# Patient Record
Sex: Male | Born: 1960 | Race: White | Hispanic: No | Smoking: Never smoker
Health system: Southern US, Community
[De-identification: ages and names within clinical notes are randomized; demographics above are authoritative.]

## PROBLEM LIST (undated history)

## (undated) DIAGNOSIS — J80 Acute respiratory distress syndrome: Secondary | ICD-10-CM

## (undated) DIAGNOSIS — I4819 Other persistent atrial fibrillation: Secondary | ICD-10-CM

## (undated) DIAGNOSIS — Z87442 Personal history of urinary calculi: Secondary | ICD-10-CM

## (undated) DIAGNOSIS — Z8711 Personal history of peptic ulcer disease: Secondary | ICD-10-CM

## (undated) DIAGNOSIS — J189 Pneumonia, unspecified organism: Secondary | ICD-10-CM

## (undated) DIAGNOSIS — Z9289 Personal history of other medical treatment: Secondary | ICD-10-CM

## (undated) DIAGNOSIS — E669 Obesity, unspecified: Secondary | ICD-10-CM

## (undated) DIAGNOSIS — K219 Gastro-esophageal reflux disease without esophagitis: Secondary | ICD-10-CM

## (undated) DIAGNOSIS — Z9981 Dependence on supplemental oxygen: Secondary | ICD-10-CM

## (undated) DIAGNOSIS — G4733 Obstructive sleep apnea (adult) (pediatric): Secondary | ICD-10-CM

## (undated) DIAGNOSIS — G4700 Insomnia, unspecified: Secondary | ICD-10-CM

## (undated) DIAGNOSIS — Z9889 Other specified postprocedural states: Secondary | ICD-10-CM

## (undated) DIAGNOSIS — E785 Hyperlipidemia, unspecified: Secondary | ICD-10-CM

## (undated) DIAGNOSIS — T148XXA Other injury of unspecified body region, initial encounter: Secondary | ICD-10-CM

## (undated) DIAGNOSIS — M109 Gout, unspecified: Secondary | ICD-10-CM

## (undated) DIAGNOSIS — Z9989 Dependence on other enabling machines and devices: Secondary | ICD-10-CM

## (undated) DIAGNOSIS — I10 Essential (primary) hypertension: Secondary | ICD-10-CM

## (undated) HISTORY — PX: APPENDECTOMY: SHX54

## (undated) HISTORY — DX: Other specified postprocedural states: Z98.890

## (undated) HISTORY — DX: Other injury of unspecified body region, initial encounter: T14.8XXA

## (undated) HISTORY — DX: Obesity, unspecified: E66.9

## (undated) HISTORY — DX: Gastro-esophageal reflux disease without esophagitis: K21.9

## (undated) HISTORY — DX: Hyperlipidemia, unspecified: E78.5

## (undated) HISTORY — DX: Insomnia, unspecified: G47.00

## (undated) HISTORY — DX: Essential (primary) hypertension: I10

## (undated) HISTORY — DX: Gout, unspecified: M10.9

## (undated) HISTORY — PX: NERVE REPAIR: SHX2083

---
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
## 2013-05-30 DIAGNOSIS — Z9289 Personal history of other medical treatment: Secondary | ICD-10-CM

## 2013-05-30 HISTORY — DX: Personal history of other medical treatment: Z92.89

## 2014-04-29 DIAGNOSIS — Z8711 Personal history of peptic ulcer disease: Secondary | ICD-10-CM

## 2014-04-29 HISTORY — DX: Personal history of peptic ulcer disease: Z87.11

## 2014-05-30 DIAGNOSIS — Z8701 Personal history of pneumonia (recurrent): Secondary | ICD-10-CM | POA: Insufficient documentation

## 2014-07-29 HISTORY — PX: TRACHELECTOMY: SHX6586

## 2014-08-16 ENCOUNTER — Inpatient Hospital Stay
Admission: AD | Admit: 2014-08-16 | Payer: Self-pay | Source: Other Acute Inpatient Hospital | Admitting: Internal Medicine

## 2014-10-21 ENCOUNTER — Encounter: Payer: Self-pay | Admitting: Critical Care Medicine

## 2014-10-21 ENCOUNTER — Ambulatory Visit (INDEPENDENT_AMBULATORY_CARE_PROVIDER_SITE_OTHER): Payer: Self-pay | Admitting: Critical Care Medicine

## 2014-10-21 VITALS — BP 110/78 | HR 78 | Temp 98.0°F | Ht 72.0 in | Wt 302.2 lb

## 2014-10-21 DIAGNOSIS — Z8701 Personal history of pneumonia (recurrent): Secondary | ICD-10-CM

## 2014-10-21 DIAGNOSIS — E785 Hyperlipidemia, unspecified: Secondary | ICD-10-CM | POA: Insufficient documentation

## 2014-10-21 DIAGNOSIS — K219 Gastro-esophageal reflux disease without esophagitis: Secondary | ICD-10-CM

## 2014-10-21 DIAGNOSIS — J9611 Chronic respiratory failure with hypoxia: Secondary | ICD-10-CM

## 2014-10-21 DIAGNOSIS — Z9889 Other specified postprocedural states: Secondary | ICD-10-CM | POA: Insufficient documentation

## 2014-10-21 DIAGNOSIS — R0600 Dyspnea, unspecified: Secondary | ICD-10-CM

## 2014-10-21 DIAGNOSIS — I10 Essential (primary) hypertension: Secondary | ICD-10-CM | POA: Insufficient documentation

## 2014-10-21 DIAGNOSIS — J984 Other disorders of lung: Secondary | ICD-10-CM

## 2014-10-21 DIAGNOSIS — Z8709 Personal history of other diseases of the respiratory system: Secondary | ICD-10-CM

## 2014-10-21 DIAGNOSIS — I482 Chronic atrial fibrillation, unspecified: Secondary | ICD-10-CM | POA: Insufficient documentation

## 2014-10-21 DIAGNOSIS — J45909 Unspecified asthma, uncomplicated: Secondary | ICD-10-CM

## 2014-10-21 NOTE — Patient Instructions (Signed)
You must wear oxygen with exertion and at rest 2Liters A repeat Chest xray will be obtained A full lung function test will be obtained Return 1 month

## 2014-10-21 NOTE — Progress Notes (Signed)
Subjective:    Patient ID: Adam Benson, male    DOB: 1961/01/08, 54 y.o.   MRN: [id number]  HPI Comments: Referred for post hosp care:  In [place] hosp 05/2014>>06/2014 and then trf to [place] for further care for one month.   Pt was on vent with trach:  orig dx PNA.  Lifelong never smoker.  Out of hosp two months, trach taken out in [place] and d/c to home.    Pt had atrial fib and is on chronic anticoag therapy  Since home doing well, gradual return of smoke.  Electrician : nerve damage right arm, now on R arm f/t separate issue, now on disability  Has oxygen wears at home and prn out of house, on 2L qhs.  Never dx osa  Shortness of Breath This is a chronic problem. The problem occurs daily (exert self walking fast ). The problem has been rapidly improving. Pertinent negatives include no abdominal pain, chest pain, coryza, ear pain, headaches, leg pain, leg swelling, rash, rhinorrhea, sore throat, vomiting or wheezing. The symptoms are aggravated by exercise. He has tried nothing for the symptoms. His past medical history is significant for asthma and pneumonia. There is no history of allergies, aspirin allergies, bronchiolitis, CAD, DVT, a heart failure, PE or a recent surgery. (Asthma as a child )   Past Medical History  Diagnosis Date  . Gout   . HTN (hypertension), benign   . Chronic atrial fibrillation   . Hyperlipemia   . Nerve damage     right arm  . Obesity   . Insomnia   . GERD (gastroesophageal reflux disease)   . History of pneumonia 05/2014     two month hosp stay, vent, trach   . History of tracheostomy 06/2014>>out 07/2014    during 2016 hosp stay /vent     Family History  Problem Relation Age of Onset  . Heart disease Mother   . Diabetes Mother      History   Social History  . Marital Status: N/A    Spouse Name: N/A  . Number of Children: N/A  . Years of Education: N/A   Occupational History  . Not on file.   Social History Main Topics  . Smoking  status: Never Smoker   . Smokeless tobacco: Current User    Types: Chew  . Alcohol Use: No  . Drug Use: Not on file  . Sexual Activity: Not on file   Other Topics Concern  . Not on file   Social History Narrative   Lives alone. Divorced.        Allergies  Allergen Reactions  . Penicillins     hives     No outpatient prescriptions prior to visit.   No facility-administered medications prior to visit.       Review of Systems  Constitutional: Negative.   HENT: Negative.  Negative for ear pain, postnasal drip, rhinorrhea, sinus pressure, sore throat, trouble swallowing and voice change.   Eyes: Negative.   Respiratory: Positive for shortness of breath. Negative for apnea, cough, choking, chest tightness, wheezing and stridor.   Cardiovascular: Negative.  Negative for chest pain, palpitations and leg swelling.  Gastrointestinal: Negative.  Negative for nausea, vomiting, abdominal pain and abdominal distention.  Genitourinary: Negative.   Musculoskeletal: Negative.  Negative for myalgias and arthralgias.  Skin: Negative.  Negative for rash.  Allergic/Immunologic: Negative.  Negative for environmental allergies and food allergies.  Neurological: Negative.  Negative for dizziness, syncope, weakness and headaches.  Hematological: Negative.  Negative for adenopathy. Does not bruise/bleed easily.  Psychiatric/Behavioral: Negative.  Negative for sleep disturbance and agitation. The patient is not nervous/anxious.        Objective:   Physical Exam Filed Vitals:   10/21/14 1112  BP: 110/78  Pulse: 78  Temp: 98 F (36.7 C)  TempSrc: Oral  Height: 6' (1.829 m)  Weight: 302 lb 3.2 oz (137.077 kg)  SpO2: 94%    Gen: Pleasant, well-nourished, in no distress,  normal affect  ENT: No lesions,  mouth clear,  oropharynx clear, no postnasal drip  Neck: No JVD, no TMG, no carotid bruits  Lungs: No use of accessory muscles, no dullness to percussion,distant bs    Cardiovascular: RRR, heart sounds normal, no murmur or gallops, no peripheral edema  Abdomen: soft and NT, no HSM,  BS normal  Musculoskeletal: No deformities, no cyanosis or clubbing  Neuro: alert, non focal  Skin: Warm, no lesions or rashes  No results found.  Spirometry 5/24: restrictive defect   AMB sat:  83% after 3/4 lab on RA    Assessment & Plan:  I personally reviewed all images and lab data in the [facility] system as well as any outside material available during this office visit and agree with the  radiology impressions.   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  Diagnoses and all orders for this visit:  Chronic respiratory failure with hypoxia Orders: -     Pulmonary Function Test; Future -     DG Chest 2 View; Future -     DG Chest 2 View -     Pulmonary Function Test  HTN (hypertension), benign  Chronic atrial fibrillation  Hyperlipemia  Gastroesophageal reflux disease, esophagitis presence not specified  History of pneumonia Orders: -     DG Chest 2 View; Future -     DG Chest 2 View  History of tracheostomy  Dyspnea Orders: -     Spirometry with Graph  ARDS survivor Orders: -     Pulmonary Function Test; Future -     DG Chest 2 View; Future -     DG Chest 2 View -     Pulmonary Function Test  Restrictive airway disease Orders: -     Pulmonary Function Test; Future -     DG Chest 2 View; Future -     DG Chest 2 View -     Pulmonary Function Test    I had an extended discussion with the patient and or family lasting 15 minutes of a 25 minute visit including:  Disease state, treatement options.

## 2014-10-22 DIAGNOSIS — J984 Other disorders of lung: Secondary | ICD-10-CM | POA: Insufficient documentation

## 2014-10-22 DIAGNOSIS — Z8709 Personal history of other diseases of the respiratory system: Secondary | ICD-10-CM | POA: Insufficient documentation

## 2014-10-22 DIAGNOSIS — J9611 Chronic respiratory failure with hypoxia: Secondary | ICD-10-CM | POA: Insufficient documentation

## 2014-10-22 NOTE — Assessment & Plan Note (Signed)
Chronic resp failure with hypoxemia Cont oxygen therapy 24/7

## 2014-10-22 NOTE — Assessment & Plan Note (Signed)
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

## 2014-10-22 NOTE — Assessment & Plan Note (Signed)
Restrictive lung disease with interstitial process Plan  full pfts Repeat chest xray

## 2014-10-24 ENCOUNTER — Telehealth: Payer: Self-pay | Admitting: Critical Care Medicine

## 2014-10-24 DIAGNOSIS — J984 Other disorders of lung: Secondary | ICD-10-CM

## 2014-10-24 NOTE — Telephone Encounter (Signed)
Let pt know cxr shows residual scar from pneumonia This is why pt needs oxygen No other therapy

## 2014-10-24 NOTE — Telephone Encounter (Signed)
[phone], pt cb for crystal

## 2014-10-24 NOTE — Telephone Encounter (Signed)
[email or web address] for pt 

## 2014-10-24 NOTE — Telephone Encounter (Signed)
Called, spoke with pt.  Discussed cxr results and recs per Dr. Delford Field.  He verbalized understanding and voiced no further questions or concerns at this time.

## 2014-10-28 LAB — PULMONARY FUNCTION TEST

## 2014-11-04 ENCOUNTER — Telehealth: Payer: Self-pay | Admitting: Critical Care Medicine

## 2014-11-04 NOTE — Telephone Encounter (Signed)
-----   Message from Valentino Hue, RN sent at 11/04/2014  2:34 PM EDT ----- Regarding: PFT pt This is the PFT pt for [place] results

## 2014-11-04 NOTE — Telephone Encounter (Signed)
Let pt know pfts show severe restriction from scar tissue from pneumonia  no specific Rx for this

## 2014-11-04 NOTE — Telephone Encounter (Signed)
Spoke with pt.  Discussed PFT results and recs per Dr. Delford Field.  He verbalized understanding and voiced no further questions or concerns at this time.  Results placed in scan folder.

## 2014-11-18 ENCOUNTER — Ambulatory Visit (INDEPENDENT_AMBULATORY_CARE_PROVIDER_SITE_OTHER): Payer: Self-pay | Admitting: Critical Care Medicine

## 2014-11-18 ENCOUNTER — Encounter: Payer: Self-pay | Admitting: Critical Care Medicine

## 2014-11-18 VITALS — BP 122/80 | HR 86 | Temp 98.6°F | Ht 72.0 in | Wt 309.0 lb

## 2014-11-18 DIAGNOSIS — J45909 Unspecified asthma, uncomplicated: Secondary | ICD-10-CM

## 2014-11-18 DIAGNOSIS — J849 Interstitial pulmonary disease, unspecified: Secondary | ICD-10-CM

## 2014-11-18 DIAGNOSIS — J984 Other disorders of lung: Secondary | ICD-10-CM

## 2014-11-18 DIAGNOSIS — J9611 Chronic respiratory failure with hypoxia: Secondary | ICD-10-CM

## 2014-11-18 NOTE — Patient Instructions (Signed)
No change in medications Stay on oxygen Return as needed

## 2014-11-18 NOTE — Progress Notes (Signed)
Subjective:    Patient ID: Adam Benson, male    DOB: 07-29-1960, 54 y.o.   MRN: [id number]  HPI 11/18/2014 Chief Complaint  Patient presents with  . 1 month follow up    DOE unchanged.  Occas sharp CP.  No cough or wheezing.  No change in dyspnea. No cough. On oxygen.  Not interested in pulm rehab Pt denies any significant sore throat, nasal congestion or excess secretions, fever, chills, sweats, unintended weight loss, pleurtic or exertional chest pain, orthopnea PND, or leg swelling Pt denies any increase in rescue therapy over baseline, denies waking up needing it or having any early am or nocturnal exacerbations of coughing/wheezing/or dyspnea. Pt also denies any obvious fluctuation in symptoms with  weather or environmental change or other alleviating or aggravating factors   Current Medications, Allergies, Complete Past Medical History, Past Surgical History, Family History, and Social History were reviewed in [facility] electronic medical record per todays encounter:  11/18/2014   Review of Systems  Constitutional: Negative.   HENT: Negative.  Negative for ear pain, postnasal drip, rhinorrhea, sinus pressure, sore throat, trouble swallowing and voice change.   Eyes: Negative.   Respiratory: Positive for shortness of breath. Negative for apnea, cough, choking, chest tightness, wheezing and stridor.   Cardiovascular: Negative.  Negative for chest pain, palpitations and leg swelling.  Gastrointestinal: Negative.  Negative for nausea, vomiting, abdominal pain and abdominal distention.  Genitourinary: Negative.   Musculoskeletal: Negative.  Negative for myalgias and arthralgias.  Skin: Negative.  Negative for rash.  Allergic/Immunologic: Negative.  Negative for environmental allergies and food allergies.  Neurological: Negative.  Negative for dizziness, syncope, weakness and headaches.  Hematological: Negative.  Negative for adenopathy. Does not bruise/bleed easily.    Psychiatric/Behavioral: Negative.  Negative for sleep disturbance and agitation. The patient is not nervous/anxious.        Objective:   Physical Exam Filed Vitals:   11/18/14 1554  BP: 122/80  Pulse: 86  Temp: 98.6 F (37 C)  TempSrc: Oral  Height: 6' (1.829 m)  Weight: 309 lb (140.161 kg)  SpO2: 99%    Gen: Pleasant, well-nourished, in no distress,  normal affect  ENT: No lesions,  mouth clear,  oropharynx clear, no postnasal drip  Neck: No JVD, no TMG, no carotid bruits  Lungs: No use of accessory muscles, no dullness to percussion, dry bibasilar rales  Cardiovascular: RRR, heart sounds normal, no murmur or gallops, no peripheral edema  Abdomen: soft and NT, no HSM,  BS normal  Musculoskeletal: No deformities, no cyanosis or clubbing  Neuro: alert, non focal  Skin: Warm, no lesions or rashes  No results found.    Spirometry 10/21/14: FeV1 65% FVC 58% FeV1/FVC 89% CXR : scarring c/w resolved ARDS PFTS: 11/04/2014: FeV1 73% FVC 60% FeV1/FVC 93% TLC 64% DLCO 40%        Assessment & Plan:  I personally reviewed all images and lab data in the [facility] system as well as any outside material available during this office visit and agree with the  radiology impressions.   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 Now oxygen dependent No specific lung Rx for same Steroids of no role Plan Cont oxygen Rx i recommended pulm rehab, pt declined Return pulm prn   Adam Benson was seen today for 1 month follow up.  Diagnoses and all orders for this visit:  ILD (interstitial lung disease)  Chronic respiratory failure with hypoxia  Restrictive airway disease

## 2014-11-19 NOTE — Assessment & Plan Note (Signed)
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 Now oxygen dependent No specific lung Rx for same Steroids of no role Plan Cont oxygen Rx i recommended pulm rehab, pt declined Return pulm prn

## 2015-01-31 ENCOUNTER — Inpatient Hospital Stay: Admit: 2015-01-31 | Payer: Self-pay | Admitting: Pulmonary Disease

## 2015-02-01 ENCOUNTER — Encounter (HOSPITAL_COMMUNITY): Payer: Self-pay | Admitting: Pulmonary Disease

## 2015-02-01 ENCOUNTER — Inpatient Hospital Stay (HOSPITAL_COMMUNITY): Payer: Medicaid Other

## 2015-02-01 ENCOUNTER — Inpatient Hospital Stay (HOSPITAL_COMMUNITY)
Admission: AD | Admit: 2015-02-01 | Discharge: 2015-03-11 | DRG: 004 | Disposition: A | Discharge status: Rehabilitation Facility | Payer: Medicaid Other | Source: Other Acute Inpatient Hospital | Attending: Pulmonary Disease | Admitting: Pulmonary Disease

## 2015-02-01 ENCOUNTER — Inpatient Hospital Stay (HOSPITAL_BASED_OUTPATIENT_CLINIC_OR_DEPARTMENT_OTHER): Payer: Medicaid Other

## 2015-02-01 DIAGNOSIS — J9501 Hemorrhage from tracheostomy stoma: Secondary | ICD-10-CM | POA: Diagnosis not present

## 2015-02-01 DIAGNOSIS — D649 Anemia, unspecified: Secondary | ICD-10-CM | POA: Diagnosis not present

## 2015-02-01 DIAGNOSIS — Z9289 Personal history of other medical treatment: Secondary | ICD-10-CM

## 2015-02-01 DIAGNOSIS — A047 Enterocolitis due to Clostridium difficile: Secondary | ICD-10-CM | POA: Diagnosis not present

## 2015-02-01 DIAGNOSIS — Z8249 Family history of ischemic heart disease and other diseases of the circulatory system: Secondary | ICD-10-CM | POA: Diagnosis not present

## 2015-02-01 DIAGNOSIS — N183 Chronic kidney disease, stage 3 unspecified: Secondary | ICD-10-CM | POA: Diagnosis present

## 2015-02-01 DIAGNOSIS — Z452 Encounter for adjustment and management of vascular access device: Secondary | ICD-10-CM | POA: Diagnosis not present

## 2015-02-01 DIAGNOSIS — R001 Bradycardia, unspecified: Secondary | ICD-10-CM | POA: Diagnosis not present

## 2015-02-01 DIAGNOSIS — A419 Sepsis, unspecified organism: Secondary | ICD-10-CM | POA: Diagnosis not present

## 2015-02-01 DIAGNOSIS — J984 Other disorders of lung: Secondary | ICD-10-CM

## 2015-02-01 DIAGNOSIS — E872 Acidosis: Secondary | ICD-10-CM | POA: Diagnosis not present

## 2015-02-01 DIAGNOSIS — Z8701 Personal history of pneumonia (recurrent): Secondary | ICD-10-CM

## 2015-02-01 DIAGNOSIS — J189 Pneumonia, unspecified organism: Secondary | ICD-10-CM | POA: Diagnosis not present

## 2015-02-01 DIAGNOSIS — E785 Hyperlipidemia, unspecified: Secondary | ICD-10-CM | POA: Diagnosis not present

## 2015-02-01 DIAGNOSIS — R6521 Severe sepsis with septic shock: Secondary | ICD-10-CM | POA: Diagnosis not present

## 2015-02-01 DIAGNOSIS — R5381 Other malaise: Secondary | ICD-10-CM | POA: Diagnosis not present

## 2015-02-01 DIAGNOSIS — Z7901 Long term (current) use of anticoagulants: Secondary | ICD-10-CM

## 2015-02-01 DIAGNOSIS — I5031 Acute diastolic (congestive) heart failure: Secondary | ICD-10-CM | POA: Diagnosis present

## 2015-02-01 DIAGNOSIS — Z833 Family history of diabetes mellitus: Secondary | ICD-10-CM

## 2015-02-01 DIAGNOSIS — K219 Gastro-esophageal reflux disease without esophagitis: Secondary | ICD-10-CM | POA: Diagnosis present

## 2015-02-01 DIAGNOSIS — J849 Interstitial pulmonary disease, unspecified: Secondary | ICD-10-CM | POA: Diagnosis not present

## 2015-02-01 DIAGNOSIS — Z6841 Body Mass Index (BMI) 40.0 and over, adult: Secondary | ICD-10-CM | POA: Diagnosis not present

## 2015-02-01 DIAGNOSIS — E870 Hyperosmolality and hypernatremia: Secondary | ICD-10-CM | POA: Diagnosis not present

## 2015-02-01 DIAGNOSIS — G8929 Other chronic pain: Secondary | ICD-10-CM | POA: Diagnosis not present

## 2015-02-01 DIAGNOSIS — J9622 Acute and chronic respiratory failure with hypercapnia: Secondary | ICD-10-CM

## 2015-02-01 DIAGNOSIS — Z23 Encounter for immunization: Secondary | ICD-10-CM | POA: Diagnosis not present

## 2015-02-01 DIAGNOSIS — M109 Gout, unspecified: Secondary | ICD-10-CM | POA: Diagnosis not present

## 2015-02-01 DIAGNOSIS — R05 Cough: Secondary | ICD-10-CM

## 2015-02-01 DIAGNOSIS — J80 Acute respiratory distress syndrome: Secondary | ICD-10-CM | POA: Diagnosis present

## 2015-02-01 DIAGNOSIS — Z8711 Personal history of peptic ulcer disease: Secondary | ICD-10-CM

## 2015-02-01 DIAGNOSIS — J45909 Unspecified asthma, uncomplicated: Secondary | ICD-10-CM | POA: Diagnosis not present

## 2015-02-01 DIAGNOSIS — I4891 Unspecified atrial fibrillation: Secondary | ICD-10-CM | POA: Diagnosis not present

## 2015-02-01 DIAGNOSIS — Z221 Carrier of other intestinal infectious diseases: Secondary | ICD-10-CM | POA: Diagnosis not present

## 2015-02-01 DIAGNOSIS — I482 Chronic atrial fibrillation: Secondary | ICD-10-CM | POA: Diagnosis not present

## 2015-02-01 DIAGNOSIS — I130 Hypertensive heart and chronic kidney disease with heart failure and stage 1 through stage 4 chronic kidney disease, or unspecified chronic kidney disease: Secondary | ICD-10-CM | POA: Diagnosis present

## 2015-02-01 DIAGNOSIS — R739 Hyperglycemia, unspecified: Secondary | ICD-10-CM | POA: Diagnosis not present

## 2015-02-01 DIAGNOSIS — N179 Acute kidney failure, unspecified: Secondary | ICD-10-CM | POA: Diagnosis not present

## 2015-02-01 DIAGNOSIS — R0603 Acute respiratory distress: Secondary | ICD-10-CM

## 2015-02-01 DIAGNOSIS — T380X5A Adverse effect of glucocorticoids and synthetic analogues, initial encounter: Secondary | ICD-10-CM | POA: Diagnosis not present

## 2015-02-01 DIAGNOSIS — Z880 Allergy status to penicillin: Secondary | ICD-10-CM | POA: Diagnosis not present

## 2015-02-01 DIAGNOSIS — K5900 Constipation, unspecified: Secondary | ICD-10-CM | POA: Diagnosis not present

## 2015-02-01 DIAGNOSIS — Z5189 Encounter for other specified aftercare: Secondary | ICD-10-CM | POA: Diagnosis not present

## 2015-02-01 DIAGNOSIS — J9690 Respiratory failure, unspecified, unspecified whether with hypoxia or hypercapnia: Secondary | ICD-10-CM

## 2015-02-01 DIAGNOSIS — R059 Cough, unspecified: Secondary | ICD-10-CM | POA: Insufficient documentation

## 2015-02-01 DIAGNOSIS — R0600 Dyspnea, unspecified: Secondary | ICD-10-CM | POA: Diagnosis present

## 2015-02-01 DIAGNOSIS — J9621 Acute and chronic respiratory failure with hypoxia: Secondary | ICD-10-CM | POA: Diagnosis not present

## 2015-02-01 DIAGNOSIS — J9601 Acute respiratory failure with hypoxia: Secondary | ICD-10-CM | POA: Diagnosis not present

## 2015-02-01 DIAGNOSIS — Z430 Encounter for attention to tracheostomy: Secondary | ICD-10-CM | POA: Insufficient documentation

## 2015-02-01 DIAGNOSIS — Z4659 Encounter for fitting and adjustment of other gastrointestinal appliance and device: Secondary | ICD-10-CM

## 2015-02-01 DIAGNOSIS — Z79891 Long term (current) use of opiate analgesic: Secondary | ICD-10-CM

## 2015-02-01 DIAGNOSIS — J9600 Acute respiratory failure, unspecified whether with hypoxia or hypercapnia: Secondary | ICD-10-CM | POA: Insufficient documentation

## 2015-02-01 DIAGNOSIS — E876 Hypokalemia: Secondary | ICD-10-CM | POA: Diagnosis not present

## 2015-02-01 DIAGNOSIS — G629 Polyneuropathy, unspecified: Secondary | ICD-10-CM | POA: Diagnosis not present

## 2015-02-01 DIAGNOSIS — S143XXS Injury of brachial plexus, sequela: Secondary | ICD-10-CM | POA: Diagnosis not present

## 2015-02-01 DIAGNOSIS — Z0189 Encounter for other specified special examinations: Secondary | ICD-10-CM

## 2015-02-01 DIAGNOSIS — J9611 Chronic respiratory failure with hypoxia: Secondary | ICD-10-CM | POA: Diagnosis present

## 2015-02-01 DIAGNOSIS — A0472 Enterocolitis due to Clostridium difficile, not specified as recurrent: Secondary | ICD-10-CM

## 2015-02-01 DIAGNOSIS — R609 Edema, unspecified: Secondary | ICD-10-CM | POA: Diagnosis not present

## 2015-02-01 DIAGNOSIS — G589 Mononeuropathy, unspecified: Secondary | ICD-10-CM | POA: Diagnosis not present

## 2015-02-01 DIAGNOSIS — Z9889 Other specified postprocedural states: Secondary | ICD-10-CM | POA: Diagnosis not present

## 2015-02-01 LAB — CBC WITH DIFFERENTIAL/PLATELET
BASOS ABS: 0 10*3/uL (ref 0.0–0.1)
Basophils Relative: 0 % (ref 0–1)
EOS ABS: 0 10*3/uL (ref 0.0–0.7)
Eosinophils Relative: 0 % (ref 0–5)
HEMATOCRIT: 35.9 % — AB (ref 39.0–52.0)
Hemoglobin: 10.4 g/dL — ABNORMAL LOW (ref 13.0–17.0)
LYMPHS ABS: 1 10*3/uL (ref 0.7–4.0)
LYMPHS PCT: 5 % — AB (ref 12–46)
MCH: 21 pg — ABNORMAL LOW (ref 26.0–34.0)
MCHC: 29 g/dL — AB (ref 30.0–36.0)
MCV: 72.5 fL — ABNORMAL LOW (ref 78.0–100.0)
MONOS PCT: 4 % (ref 3–12)
Monocytes Absolute: 0.8 10*3/uL (ref 0.1–1.0)
Neutro Abs: 17.2 10*3/uL — ABNORMAL HIGH (ref 1.7–7.7)
Neutrophils Relative %: 91 % — ABNORMAL HIGH (ref 43–77)
Platelets: 311 10*3/uL (ref 150–400)
RBC: 4.95 MIL/uL (ref 4.22–5.81)
RDW: 19.5 % — AB (ref 11.5–15.5)
WBC: 19 10*3/uL — AB (ref 4.0–10.5)

## 2015-02-01 LAB — BLOOD GAS, ARTERIAL
ACID-BASE EXCESS: 1.8 mmol/L (ref 0.0–2.0)
Bicarbonate: 28.2 mEq/L — ABNORMAL HIGH (ref 20.0–24.0)
DRAWN BY: 419771
FIO2: 1
MECHVT: 500 mL
O2 Saturation: 87.7 %
PEEP/CPAP: 10 cmH2O
PO2 ART: 65.3 mmHg — AB (ref 80.0–100.0)
Patient temperature: 98.6
RATE: 25 resp/min
TCO2: 30.2 mmol/L (ref 0–100)
pCO2 arterial: 64.9 mmHg (ref 35.0–45.0)
pH, Arterial: 7.261 — ABNORMAL LOW (ref 7.350–7.450)

## 2015-02-01 LAB — URINALYSIS, ROUTINE W REFLEX MICROSCOPIC
BILIRUBIN URINE: NEGATIVE
Glucose, UA: NEGATIVE mg/dL
Hgb urine dipstick: NEGATIVE
KETONES UR: NEGATIVE mg/dL
LEUKOCYTES UA: NEGATIVE
NITRITE: NEGATIVE
PH: 5.5 (ref 5.0–8.0)
PROTEIN: NEGATIVE mg/dL
Specific Gravity, Urine: 1.015 (ref 1.005–1.030)
UROBILINOGEN UA: 0.2 mg/dL (ref 0.0–1.0)

## 2015-02-01 LAB — PHOSPHORUS
PHOSPHORUS: 4.1 mg/dL (ref 2.5–4.6)
PHOSPHORUS: 3.6 mg/dL (ref 2.5–4.6)
Phosphorus: 5 mg/dL — ABNORMAL HIGH (ref 2.5–4.6)

## 2015-02-01 LAB — LACTIC ACID, PLASMA
LACTIC ACID, VENOUS: 1.1 mmol/L (ref 0.5–2.0)
Lactic Acid, Venous: 1 mmol/L (ref 0.5–2.0)

## 2015-02-01 LAB — COMPREHENSIVE METABOLIC PANEL
ALBUMIN: 3 g/dL — AB (ref 3.5–5.0)
ALT: 11 U/L — ABNORMAL LOW (ref 17–63)
ANION GAP: 8 (ref 5–15)
AST: 20 U/L (ref 15–41)
Alkaline Phosphatase: 32 U/L — ABNORMAL LOW (ref 38–126)
BUN: 18 mg/dL (ref 6–20)
CO2: 30 mmol/L (ref 22–32)
Calcium: 8.1 mg/dL — ABNORMAL LOW (ref 8.9–10.3)
Chloride: 99 mmol/L — ABNORMAL LOW (ref 101–111)
Creatinine, Ser: 1.83 mg/dL — ABNORMAL HIGH (ref 0.61–1.24)
GFR calc non Af Amer: 41 mL/min — ABNORMAL LOW (ref >60)
GFR, EST AFRICAN AMERICAN: 48 mL/min — AB (ref >60)
GLUCOSE: 143 mg/dL — AB (ref 65–99)
POTASSIUM: 4.2 mmol/L (ref 3.5–5.1)
SODIUM: 137 mmol/L (ref 135–145)
TOTAL PROTEIN: 5.5 g/dL — AB (ref 6.5–8.1)
Total Bilirubin: 0.6 mg/dL (ref 0.3–1.2)

## 2015-02-01 LAB — POCT I-STAT 3, ART BLOOD GAS (G3+)
Acid-Base Excess: 1 mmol/L (ref 0.0–2.0)
Bicarbonate: 28.5 mEq/L — ABNORMAL HIGH (ref 20.0–24.0)
O2 Saturation: 88 %
PCO2 ART: 58.1 mmHg — AB (ref 35.0–45.0)
PH ART: 7.296 — AB (ref 7.350–7.450)
TCO2: 30 mmol/L (ref 0–100)
pO2, Arterial: 61 mmHg — ABNORMAL LOW (ref 80.0–100.0)

## 2015-02-01 LAB — MAGNESIUM
MAGNESIUM: 3.1 mg/dL — AB (ref 1.7–2.4)
Magnesium: 1.9 mg/dL (ref 1.7–2.4)
Magnesium: 2.6 mg/dL — ABNORMAL HIGH (ref 1.7–2.4)

## 2015-02-01 LAB — GLUCOSE, CAPILLARY
GLUCOSE-CAPILLARY: 132 mg/dL — AB (ref 65–99)
GLUCOSE-CAPILLARY: 146 mg/dL — AB (ref 65–99)
Glucose-Capillary: 137 mg/dL — ABNORMAL HIGH (ref 65–99)
Glucose-Capillary: 151 mg/dL — ABNORMAL HIGH (ref 65–99)
Glucose-Capillary: 135 mg/dL — ABNORMAL HIGH (ref 65–99)
Glucose-Capillary: 143 mg/dL — ABNORMAL HIGH (ref 65–99)

## 2015-02-01 LAB — PROTIME-INR
INR: 1.66 — AB (ref 0.00–1.49)
PROTHROMBIN TIME: 19.7 s — AB (ref 11.6–15.2)

## 2015-02-01 LAB — MRSA PCR SCREENING: MRSA BY PCR: NEGATIVE

## 2015-02-01 LAB — STREP PNEUMONIAE URINARY ANTIGEN: Strep Pneumo Urinary Antigen: NEGATIVE

## 2015-02-01 LAB — PROCALCITONIN: PROCALCITONIN: 1.29 ng/mL

## 2015-02-01 LAB — TRIGLYCERIDES: Triglycerides: 100 mg/dL (ref <150)

## 2015-02-01 LAB — TROPONIN I

## 2015-02-01 MED ORDER — SODIUM CHLORIDE 0.9 % IV BOLUS (SEPSIS)
500.0000 mL | Freq: Once | INTRAVENOUS | Status: AC
  Administered 2015-02-01: 500 mL via INTRAVENOUS

## 2015-02-01 MED ORDER — FENTANYL CITRATE (PF) 100 MCG/2ML IJ SOLN: 50.0000 ug | Freq: Once | INTRAMUSCULAR | Status: DC

## 2015-02-01 MED ORDER — PERFLUTREN LIPID MICROSPHERE
1.0000 mL | INTRAVENOUS | Status: AC | PRN
  Administered 2015-02-01: 3 mL via INTRAVENOUS
  Filled 2015-02-01: qty 10

## 2015-02-01 MED ORDER — LEVALBUTEROL HCL 0.63 MG/3ML IN NEBU: 0.6300 mg | INHALATION_SOLUTION | Freq: Four times a day (QID) | RESPIRATORY_TRACT | Status: DC

## 2015-02-01 MED ORDER — ENOXAPARIN SODIUM 30 MG/0.3ML ~~LOC~~ SOLN
30.0000 mg | SUBCUTANEOUS | Status: DC
  Filled 2015-02-01: qty 0.3

## 2015-02-01 MED ORDER — VITAL HIGH PROTEIN PO LIQD
1000.0000 mL | ORAL | Status: DC
  Administered 2015-02-01 – 2015-02-02 (×2): 1000 mL
  Filled 2015-02-01 (×3): qty 1000

## 2015-02-01 MED ORDER — SODIUM CHLORIDE 0.9 % IV SOLN
25.0000 ug/h | INTRAVENOUS | Status: DC
  Filled 2015-02-01: qty 50

## 2015-02-01 MED ORDER — MIDAZOLAM HCL 5 MG/ML IJ SOLN
0.0000 mg/h | INTRAMUSCULAR | Status: DC
  Administered 2015-02-01: 2 mg/h via INTRAVENOUS
  Administered 2015-02-01: 3 mg/h via INTRAVENOUS
  Administered 2015-02-02: 6 mg/h via INTRAVENOUS
  Filled 2015-02-01 (×3): qty 10

## 2015-02-01 MED ORDER — DILTIAZEM HCL 100 MG IV SOLR
5.0000 mg/h | INTRAVENOUS | Status: AC
  Administered 2015-02-01: 5 mg/h via INTRAVENOUS
  Administered 2015-02-01: 10 mg/h via INTRAVENOUS
  Administered 2015-02-02 (×4): 20 mg/h via INTRAVENOUS
  Filled 2015-02-01 (×6): qty 100

## 2015-02-01 MED ORDER — MIDAZOLAM HCL 2 MG/2ML IJ SOLN
1.0000 mg | INTRAMUSCULAR | Status: DC | PRN
  Administered 2015-02-01 – 2015-02-20 (×3): 2 mg via INTRAVENOUS
  Filled 2015-02-01 (×4): qty 2

## 2015-02-01 MED ORDER — FENTANYL BOLUS VIA INFUSION
50.0000 ug | INTRAVENOUS | Status: DC | PRN
  Filled 2015-02-01: qty 50

## 2015-02-01 MED ORDER — DEXTROSE-NACL 5-0.9 % IV SOLN
INTRAVENOUS | Status: DC
  Administered 2015-02-01: 04:00:00 via INTRAVENOUS

## 2015-02-01 MED ORDER — PROPOFOL 1000 MG/100ML IV EMUL
INTRAVENOUS | Status: AC
  Administered 2015-02-01: 35 ug/kg/min via INTRAVENOUS
  Filled 2015-02-01: qty 100

## 2015-02-01 MED ORDER — DEXTROSE 5 % IV SOLN
2.0000 g | Freq: Three times a day (TID) | INTRAVENOUS | Status: DC
  Administered 2015-02-01 – 2015-02-02 (×4): 2 g via INTRAVENOUS
  Filled 2015-02-01 (×6): qty 2

## 2015-02-01 MED ORDER — VANCOMYCIN HCL 10 G IV SOLR
2000.0000 mg | Freq: Once | INTRAVENOUS | Status: AC
  Administered 2015-02-01: 2000 mg via INTRAVENOUS
  Filled 2015-02-01: qty 2000

## 2015-02-01 MED ORDER — SODIUM CHLORIDE 0.9 % IV SOLN
250.0000 mL | INTRAVENOUS | Status: DC | PRN
  Administered 2015-02-01 – 2015-02-25 (×3): 250 mL via INTRAVENOUS

## 2015-02-01 MED ORDER — CHLORHEXIDINE GLUCONATE 0.12% ORAL RINSE (MEDLINE KIT)
15.0000 mL | Freq: Two times a day (BID) | OROMUCOSAL | Status: DC
  Administered 2015-02-01 – 2015-03-11 (×73): 15 mL via OROMUCOSAL

## 2015-02-01 MED ORDER — MAGNESIUM SULFATE 2 GM/50ML IV SOLN
2.0000 g | Freq: Once | INTRAVENOUS | Status: AC
  Administered 2015-02-01: 2 g via INTRAVENOUS
  Filled 2015-02-01: qty 50

## 2015-02-01 MED ORDER — VANCOMYCIN HCL IN DEXTROSE 1-5 GM/200ML-% IV SOLN
1000.0000 mg | Freq: Two times a day (BID) | INTRAVENOUS | Status: DC
  Administered 2015-02-01 – 2015-02-02 (×2): 1000 mg via INTRAVENOUS
  Filled 2015-02-01 (×4): qty 200

## 2015-02-01 MED ORDER — ANTISEPTIC ORAL RINSE SOLUTION (CORINZ)
7.0000 mL | Freq: Four times a day (QID) | OROMUCOSAL | Status: DC
  Administered 2015-02-01 – 2015-03-11 (×140): 7 mL via OROMUCOSAL

## 2015-02-01 MED ORDER — LEVOFLOXACIN IN D5W 750 MG/150ML IV SOLN
750.0000 mg | INTRAVENOUS | Status: DC
Start: 2015-02-01 — End: 2015-02-02
  Administered 2015-02-01 – 2015-02-02 (×2): 750 mg via INTRAVENOUS
  Filled 2015-02-01 (×3): qty 150

## 2015-02-01 MED ORDER — AMIODARONE HCL IN DEXTROSE 360-4.14 MG/200ML-% IV SOLN
INTRAVENOUS | Status: AC
  Filled 2015-02-01: qty 200

## 2015-02-01 MED ORDER — PROPOFOL 1000 MG/100ML IV EMUL: 0.0000 ug/kg/min | INTRAVENOUS | Status: DC

## 2015-02-01 MED ORDER — HEPARIN SODIUM (PORCINE) 5000 UNIT/ML IJ SOLN
5000.0000 [IU] | Freq: Three times a day (TID) | INTRAMUSCULAR | Status: DC
  Administered 2015-02-01 – 2015-03-02 (×88): 5000 [IU] via SUBCUTANEOUS
  Filled 2015-02-01 (×98): qty 1

## 2015-02-01 MED ORDER — FENTANYL CITRATE (PF) 100 MCG/2ML IJ SOLN: 25.0000 ug | INTRAMUSCULAR | Status: DC | PRN

## 2015-02-01 MED ORDER — SODIUM CHLORIDE 0.9 % IV SOLN
25.0000 ug/h | INTRAVENOUS | Status: DC
  Administered 2015-02-01: 300 ug/h via INTRAVENOUS
  Administered 2015-02-01: 200 ug/h via INTRAVENOUS
  Administered 2015-02-02 (×2): 300 ug/h via INTRAVENOUS
  Filled 2015-02-01 (×2): qty 50

## 2015-02-01 MED ORDER — PANTOPRAZOLE SODIUM 40 MG PO PACK
40.0000 mg | PACK | Freq: Every day | ORAL | Status: DC
  Administered 2015-02-01 – 2015-02-10 (×10): 40 mg
  Filled 2015-02-01 (×12): qty 20

## 2015-02-01 MED ORDER — PROPOFOL 1000 MG/100ML IV EMUL
0.0000 ug/kg/min | INTRAVENOUS | Status: DC
  Administered 2015-02-01: 35 ug/kg/min via INTRAVENOUS

## 2015-02-01 MED ORDER — DEXTROSE 5 % IV SOLN
0.0000 ug/min | INTRAVENOUS | Status: DC
  Administered 2015-02-01: 5 ug/min via INTRAVENOUS
  Administered 2015-02-07: 2 ug/min via INTRAVENOUS
  Administered 2015-02-08: 28 ug/min via INTRAVENOUS
  Filled 2015-02-01 (×6): qty 16

## 2015-02-01 MED ORDER — DILTIAZEM LOAD VIA INFUSION
20.0000 mg | Freq: Once | INTRAVENOUS | Status: AC
  Administered 2015-02-01: 20 mg via INTRAVENOUS
  Filled 2015-02-01: qty 20

## 2015-02-01 MED ORDER — PRO-STAT SUGAR FREE PO LIQD
30.0000 mL | Freq: Two times a day (BID) | ORAL | Status: DC
  Administered 2015-02-01 – 2015-02-02 (×3): 30 mL
  Filled 2015-02-01 (×4): qty 30

## 2015-02-01 MED ORDER — LEVALBUTEROL HCL 0.63 MG/3ML IN NEBU
0.6300 mg | INHALATION_SOLUTION | Freq: Four times a day (QID) | RESPIRATORY_TRACT | Status: DC
Start: 2015-02-01 — End: 2015-02-08
  Administered 2015-02-01 – 2015-02-07 (×27): 0.63 mg via RESPIRATORY_TRACT
  Filled 2015-02-01 (×51): qty 3

## 2015-02-01 MED ORDER — PROPOFOL 1000 MG/100ML IV EMUL
0.0000 ug/kg/min | INTRAVENOUS | Status: DC
  Administered 2015-02-01: 50 ug/kg/min via INTRAVENOUS
  Administered 2015-02-01: 40 ug/kg/min via INTRAVENOUS
  Administered 2015-02-01: 35 ug/kg/min via INTRAVENOUS
  Filled 2015-02-01 (×3): qty 100

## 2015-02-01 NOTE — Progress Notes (Signed)
RT changed RR to 28 per MD verbal order, based on ABG results: PH 7.29  PCO2 58.1 PO2 63 HCO3 28.5

## 2015-02-01 NOTE — Progress Notes (Signed)
PULMONARY / CRITICAL CARE MEDICINE   Name: Adam Benson MRN: [id number] DOB: Sep 10, 1960    ADMISSION DATE:  02/01/2015 CONSULTATION DATE:  02/01/2015  REFERRING MD :  Jeanene Erb that heRandolph  CHIEF COMPLAINT:  Severe CAP  INITIAL PRESENTATION: Presented to [facility] after a few days of increasing dyspnea, weight gain, & lower extremity edema.  Patient initially tried on BiPAP.   STUDIES:  Portable CXR - bilateral opacities. ETT about 6 cm above carina.  SIGNIFICANT EVENTS: 9/4 - intubated 9/4 - transferred from OSH  HISTORY OF PRESENT ILLNESS:  54 year old morbidly obese male with known history of peptic ulcer disease in December 2015. Patient is currently intubated and sedated unable to provide further history. History obtained from discussion with the patient's father as well as review of electronic medical records from outside hospital. Presented with a "few days" of increasing dyspnea as well as weight gain and lower extremity edema. Patient denied any fever or chills but did have fatigue and diaphoresis. Patient denied any throat pain. He endorses a cough as well as wheezing. The patient also endorses chest pain and palpitations. He denied any nausea, vomiting, diarrhea, or bloody stool. He had a headache for approximately one week.he denied any rashes. He denied any polyuria or polydipsia. Of note the patient had respiratory failure in January 2016 and March 2016 as well. He described his chest pain as heaviness rated 8/10 at times. The pain seemingly worsened with exertion. The patient did dorsal a tan sputum. At the outside hospital he was given Lasix 40 mg IV 1, Rocephin, & azithromycin. Initially he was tried on BiPAP but ultimately had to be endotracheally intubated for worsening respiratory status.   SUBJECTIVE: No events overnight, remains profoundly hypoxemic.  VITAL SIGNS: Temp:  [97.9 F (36.6 C)-98.5 F (36.9 C)] 97.9 F (36.6 C) (09/04 0804) Pulse Rate:  [26-120]  101 (09/04 1000) Resp:  [20-41] 20 (09/04 1000) BP: (80-128)/(48-90) 99/55 mmHg (09/04 1000) SpO2:  [87 %-100 %] 98 % (09/04 1000) FiO2 (%):  [100 %] 100 % (09/04 1000) Weight:  [140.2 kg (309 lb 1.4 oz)-170.552 kg (376 lb)] 170.552 kg (376 lb) (09/04 0300) HEMODYNAMICS: CVP:  [12 mmHg] 12 mmHg VENTILATOR SETTINGS: Vent Mode:  [-] PRVC FiO2 (%):  [100 %] 100 % Set Rate:  [25 bmp-28 bmp] 28 bmp Vt Set:  [500 mL] 500 mL PEEP:  [10 cmH20-14 cmH20] 14 cmH20 Plateau Pressure:  [29 cmH20-35 cmH20] 29 cmH20 INTAKE / OUTPUT:  Intake/Output Summary (Last 24 hours) at 02/01/15 1010 Last data filed at 02/01/15 1000  Gross per 24 hour  Intake 1731.89 ml  Output   1325 ml  Net 406.89 ml    PHYSICAL EXAMINATION: General:  Sedated. No acute distress. Morbidly obese.  Integument:  Warm & diaphoretic. No rash on exposed skin.  Lymphatics:  No appreciated cervical or supraclavicular lymphadenoapthy. HEENT:  Moist mucus membranes. No scleral injection or icterus. Endotracheal tube in place. PERRL. Cardiovascular:  Regular rate. No appreciable JVD. Atrial fibrillation on telemetry. Pulmonary:  Coarse breath sounds bilaterally. Symmetric chest wall rise on ventilator.  Abdomen: Soft. Normal bowel sounds. Protuberant.  Musculoskeletal:  Normal bulk. No joint deformity or effusion appreciated. Neurological:  Pupils reactive. Sedated.  LABS:  CBC  Recent Labs Lab 02/01/15 0507  WBC 19.0*  HGB 10.4*  HCT 35.9*  PLT 311   Coag's  Recent Labs Lab 02/01/15 0507  INR 1.66*   BMET  Recent Labs Lab 02/01/15 0507  NA 137  K  4.2  CL 99*  CO2 30  BUN 18  CREATININE 1.83*  GLUCOSE 143*   Electrolytes  Recent Labs Lab 02/01/15 0507  CALCIUM 8.1*  MG 1.9  PHOS 4.1   Sepsis Markers  Recent Labs Lab 02/01/15 0507 02/01/15 0755  LATICACIDVEN  --  1.1  PROCALCITON 1.29  --    ABG  Recent Labs Lab 02/01/15 0231 02/01/15 0924  PHART 7.261* 7.296*  PCO2ART 64.9* 58.1*   PO2ART 65.3* 61.0*   Liver Enzymes  Recent Labs Lab 02/01/15 0507  AST 20  ALT 11*  ALKPHOS 32*  BILITOT 0.6  ALBUMIN 3.0*   Cardiac Enzymes  Recent Labs Lab 02/01/15 0754  TROPONINI <0.03   Glucose  Recent Labs Lab 02/01/15 0202 02/01/15 0342 02/01/15 0802  GLUCAP 132* 137* 146*    Imaging Dg Chest Port 1 View  02/01/2015   CLINICAL DATA:  Central line placement.  Initial encounter.  EXAM: PORTABLE CHEST - 1 VIEW  COMPARISON:  Chest radiograph performed earlier today at 2:30 a.m.  FINDINGS: [name] endotracheal tube is seen ending 5 cm above the carina. A left IJ line is noted ending overlying the proximal to mid SVC. The enteric tube is seen extending below the diaphragm.  The lungs are hypoexpanded. Diffuse bilateral airspace opacification is again noted. This may reflect pulmonary edema, pneumonia or ARDS. No definite pleural effusion or pneumothorax is characterized.  The cardiomediastinal silhouette is enlarged. No acute osseous abnormalities are seen.  IMPRESSION: 1. Endotracheal tube seen ending 5 cm above the carina. 2. Left IJ line noted ending overlying the proximal to mid SVC. 3. Lungs hypoexpanded. Diffuse bilateral airspace opacification again noted. This may reflect pulmonary edema, pneumonia or ARDS. Cardiomegaly noted.   Electronically Signed   By: Roanna Raider M.D.   On: 02/01/2015 03:52   Dg Chest Port 1 View  02/01/2015   CLINICAL DATA:  Endotracheal tube placement.  Initial encounter.  EXAM: PORTABLE CHEST - 1 VIEW  COMPARISON:  None.  FINDINGS: [name] endotracheal tube is seen ending 5 cm above the carina. The enteric tube is difficult to fully characterize, but likely extends below the diaphragm.  The lungs are hypoexpanded. Diffuse bilateral airspace opacification may reflect pulmonary edema or pneumonia. ARDS could have such an appearance. Small bilateral pleural effusions are suspected. No pneumothorax is seen.  The cardiomediastinal  silhouette is enlarged. No acute osseous abnormalities are identified.  IMPRESSION: 1. Endotracheal tube seen ending 5 cm above the carina. 2. Lungs hypoexpanded. Diffuse bilateral airspace opacification may reflect pulmonary edema or pneumonia. ARDS could have such an appearance. Suspect small bilateral pleural effusions. 3. Cardiomegaly noted.   Electronically Signed   By: Roanna Raider M.D.   On: 02/01/2015 02:36    ASSESSMENT / PLAN:  PULMONARY OETT 9/4>> A: Acute on chronic hypoxic respiratory failure Severe community acquired pneumonia  P:   - Vent bundle - Continue ARDS  protocol  - Maintain even volume status  - Xopenex nebs every 6 hours   CARDIOVASCULAR R IJ CVL 9/4 A:  Septic shock Elevated BNP History of atrial fibrillation Suspected diastolic congestive heart failure  P:  - Checking transthoracic echocardiogram pending. - Maintaining mean arterial pressure greater than 65 with norepinephrine - Monitor rhythm and rate on telemetry  RENAL A:  Chronic kidney disease  P:   - Trending BUN/creatinine daily - Monitor urine output with Foley catheter - Monitoring electrolytes daily - Replace electrolytes as indicated.  GASTROINTESTINAL A:  History of peptic ulcer disease December 2015  P:    - Monitor hemoglobin daily - Protonix via tube daily - TF per nutrition.  HEMATOLOGIC A:   Leukocytosis  P:  - Monitor hemoglobin daily with CBC - Trending leukocyte count daily - SCDs - Heparin subcutaneous every 8 hours  INFECTIOUS A:   Severe CAP  P:   - Procalcitonin - Trending leukocytosis   Urine Leg Ag 9/4>> Urine Strep Ag 9/4>> Respiratory Viral PCR 9/4>>  BCx2 9/4>> UC 9/4>> Sputum 9/4>>  Abx: Vancomycin 9/4>>> Aztreonam 9/4>>> Levaquin 9/4>>>  Rocephin 9/3>>9/3 (given at OSH) Azithromycin 9/3>>9/3 (given at OSH)  ENDOCRINE A:   Hyperglycemia    P:   Accu-Cheks every 4 hours with instructions to notify M.D. for blood glucose  less than 80 or greater than 160   NEUROLOGIC A:   No acute issues   P:   - RASS goal: 0 to -1 - D/C Propofol drip and start PRN versed and fentanyl GTT.  FAMILY  - Updates:  No family bedside.  - Inter-disciplinary family meet or Palliative Care meeting due by:  9/11  The patient is critically ill with multiple organ systems failure and requires high complexity decision making for assessment and support, frequent evaluation and titration of therapies, application of advanced monitoring technologies and extensive interpretation of multiple databases.   Critical Care Time devoted to patient care services described in this note is  35  Minutes. This time reflects time of care of this signee Dr Koren Bound. This critical care time does not reflect procedure time, or teaching time or supervisory time of PA/NP/Med student/Med Resident etc but could involve care discussion time.  Alyson Reedy, M.D. [facility] Pulmonary/Critical Care Medicine. Pager: [phone]. After hours pager: [phone].

## 2015-02-01 NOTE — H&P (Signed)
PULMONARY / CRITICAL CARE MEDICINE   Name: Adam Benson MRN: [id number] DOB: September 03, 1960    ADMISSION DATE:  02/01/2015 CONSULTATION DATE:  02/01/2015  REFERRING MD :  Jeanene Erb that heRandolph  CHIEF COMPLAINT:  Severe CAP  INITIAL PRESENTATION: Presented to [facility] after a few days of increasing dyspnea, weight gain, & lower extremity edema.  Patient initially tried on BiPAP.   STUDIES:  Portable CXR - bilateral opacities. ETT about 6 cm above carina.  SIGNIFICANT EVENTS: 9/4 - intubated 9/4 - transferred from OSH  HISTORY OF PRESENT ILLNESS:  54 year old morbidly obese male with known history of peptic ulcer disease in December 2015. Patient is currently intubated and sedated unable to provide further history. History obtained from discussion with the patient's father as well as review of electronic medical records from outside hospital. Presented with a "few days" of increasing dyspnea as well as weight gain and lower extremity edema. Patient denied any fever or chills but did have fatigue and diaphoresis. Patient denied any throat pain. He endorses a cough as well as wheezing. The patient also endorses chest pain and palpitations. He denied any nausea, vomiting, diarrhea, or bloody stool. He had a headache for approximately one week.he denied any rashes. He denied any polyuria or polydipsia. Of note the patient had respiratory failure in January 2016 and March 2016 as well. He described his chest pain as heaviness rated 8/10 at times. The pain seemingly worsened with exertion. The patient did dorsal a tan sputum. At the outside hospital he was given Lasix 40 mg IV 1, Rocephin, & azithromycin. Initially he was tried on BiPAP but ultimately had to be endotracheally intubated for worsening respiratory status.   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  has past surgical history that includes arm surgery; Appendectomy; and Knee surgery. Prior to Admission medications   Medication Sig Start Date End Date Taking? Authorizing Provider  colchicine 0.6 MG tablet Take 0.6 mg by mouth 2 (two) times daily.    Historical Provider, MD  diltiazem (DILACOR XR) 240 MG 24 hr capsule Take 240 mg by mouth daily.    Historical Provider, MD  Febuxostat 80 MG TABS Take 80 mg by mouth daily.    Historical Provider, MD  fenofibrate (TRICOR) 145 MG tablet Take 145 mg by mouth daily.    Historical Provider, MD  furosemide (LASIX) 20 MG tablet Take 20 mg by mouth daily.    Historical Provider, MD  HYDROcodone-acetaminophen (NORCO) 10-325 MG per tablet Take 1 tablet by mouth every 6 (six) hours as needed.    Historical Provider, MD  metoprolol tartrate (LOPRESSOR) 25 MG tablet Take 25 mg by mouth 2 (two) times daily.    Historical Provider, MD  omeprazole (PRILOSEC) 20 MG capsule Take 20 mg by mouth 2 (two) times daily before a meal.    Historical Provider, MD  potassium chloride SA (K-DUR,KLOR-CON) 20 MEQ tablet Take 20 mEq by mouth daily.    Historical Provider, MD  pregabalin (LYRICA) 200 MG capsule Take 200 mg by mouth. Take 1 capsule three times a day.    Historical Provider, MD  rivaroxaban (XARELTO) 20 MG TABS tablet Take 20 mg by mouth daily with supper.    Historical Provider, MD  testosterone cypionate (DEPOTESTOTERONE CYPIONATE) 200 MG/ML injection Inject 200  mg into the muscle. Injection 1 time a week.    Historical Provider, MD   Allergies  Allergen Reactions  . Penicillins     hives    FAMILY HISTORY:  has no family status information on file.  SOCIAL HISTORY:  reports that he has never smoked. His smokeless tobacco use includes Chew. He reports that he does not drink alcohol.  REVIEW OF SYSTEMS:  Unable to  obtain as patient is currently elevated and sedated  SUBJECTIVE:   VITAL SIGNS: Temp:  [98.1 F (36.7 C)-98.5 F (36.9 C)] 98.1 F (36.7 C) (09/04 0344) Pulse Rate:  [86-107] 93 (09/04 0345) Resp:  [22-28] 25 (09/04 0345) BP: (80-97)/(48-69) 84/48 mmHg (09/04 0345) SpO2:  [87 %-95 %] 95 % (09/04 0345) FiO2 (%):  [100 %] 100 % (09/04 0333) Weight:  [140.2 kg (309 lb 1.4 oz)-170.552 kg (376 lb)] 170.552 kg (376 lb) (09/04 0300) HEMODYNAMICS:   VENTILATOR SETTINGS: Vent Mode:  [-] PRVC FiO2 (%):  [100 %] 100 % Set Rate:  [25 bmp] 25 bmp Vt Set:  [500 mL] 500 mL PEEP:  [10 cmH20-14 cmH20] 14 cmH20 Plateau Pressure:  [29 cmH20-35 cmH20] 29 cmH20 INTAKE / OUTPUT:  Intake/Output Summary (Last 24 hours) at 02/01/15 0408 Last data filed at 02/01/15 0300  Gross per 24 hour  Intake    505 ml  Output    475 ml  Net     30 ml    PHYSICAL EXAMINATION: General:  Sedated. No acute distress. Morbidly obese.  Integument:  Warm & diaphoretic. No rash on exposed skin.  Lymphatics:  No appreciated cervical or supraclavicular lymphadenoapthy. HEENT:  Moist mucus membranes. No scleral injection or icterus. Endotracheal tube in place. PERRL. Cardiovascular:  Regular rate. No appreciable JVD. Atrial fibrillation on telemetry. Pulmonary:  Coarse breath sounds bilaterally. Symmetric chest wall rise on ventilator.  Abdomen: Soft. Normal bowel sounds. Protuberant.  Musculoskeletal:  Normal bulk. No joint deformity or effusion appreciated. Neurological:  Pupils reactive. Sedated.  LABS:  CBC No results for input(s): WBC, HGB, HCT, PLT in the last 168 hours. Coag's No results for input(s): APTT, INR in the last 168 hours. BMET No results for input(s): NA, K, CL, CO2, BUN, CREATININE, GLUCOSE in the last 168 hours. Electrolytes No results for input(s): CALCIUM, MG, PHOS in the last 168 hours. Sepsis Markers No results for input(s): LATICACIDVEN, PROCALCITON, O2SATVEN in the last 168  hours. ABG  Recent Labs Lab 02/01/15 0231  PHART 7.261*  PCO2ART 64.9*  PO2ART 65.3*   Liver Enzymes No results for input(s): AST, ALT, ALKPHOS, BILITOT, ALBUMIN in the last 168 hours. Cardiac Enzymes No results for input(s): TROPONINI, PROBNP in the last 168 hours. Glucose  Recent Labs Lab 02/01/15 0202  GLUCAP 132*    Imaging Dg Chest Port 1 View  02/01/2015   CLINICAL DATA:  Central line placement.  Initial encounter.  EXAM: PORTABLE CHEST - 1 VIEW  COMPARISON:  Chest radiograph performed earlier today at 2:30 a.m.  FINDINGS: [name] endotracheal tube is seen ending 5 cm above the carina. A left IJ line is noted ending overlying the proximal to mid SVC. The enteric tube is seen extending below the diaphragm.  The lungs are hypoexpanded. Diffuse bilateral airspace opacification is again noted. This may reflect pulmonary edema, pneumonia or ARDS. No definite pleural effusion or pneumothorax is characterized.  The cardiomediastinal silhouette is enlarged. No acute osseous abnormalities are seen.  IMPRESSION: 1. Endotracheal tube seen ending 5 cm above the  carina. 2. Left IJ line noted ending overlying the proximal to mid SVC. 3. Lungs hypoexpanded. Diffuse bilateral airspace opacification again noted. This may reflect pulmonary edema, pneumonia or ARDS. Cardiomegaly noted.   Electronically Signed   By: Roanna Raider M.D.   On: 02/01/2015 03:52   Dg Chest Port 1 View  02/01/2015   CLINICAL DATA:  Endotracheal tube placement.  Initial encounter.  EXAM: PORTABLE CHEST - 1 VIEW  COMPARISON:  None.  FINDINGS: [name] endotracheal tube is seen ending 5 cm above the carina. The enteric tube is difficult to fully characterize, but likely extends below the diaphragm.  The lungs are hypoexpanded. Diffuse bilateral airspace opacification may reflect pulmonary edema or pneumonia. ARDS could have such an appearance. Small bilateral pleural effusions are suspected. No pneumothorax is seen.   The cardiomediastinal silhouette is enlarged. No acute osseous abnormalities are identified.  IMPRESSION: 1. Endotracheal tube seen ending 5 cm above the carina. 2. Lungs hypoexpanded. Diffuse bilateral airspace opacification may reflect pulmonary edema or pneumonia. ARDS could have such an appearance. Suspect small bilateral pleural effusions. 3. Cardiomegaly noted.   Electronically Signed   By: Roanna Raider M.D.   On: 02/01/2015 02:36    ASSESSMENT / PLAN:  PULMONARY OETT 9/4>> A: Acute on chronic hypoxic respiratory failure Severe community acquired pneumonia  P:   Vent bundle Continue ARDS  protocol  Maintain even volume status   Xopenex nebs every 6 hours   CARDIOVASCULAR R IJ CVL 9/4 A:  Septic shock Elevated BNP History of atrial fibrillation Suspected diastolic congestive heart failure  P:  Trending cardiac biomarkers Checking transthoracic echocardiogram Maintaining mean arterial pressure greater than 65 with norepinephrine Monitor rhythm and rate on telemetry  RENAL A:  Chronic kidney disease  P:   Trending BUN/creatinine daily Monitor urine output with Foley catheter Monitoring electrolytes daily  GASTROINTESTINAL A:   History of peptic ulcer disease December 2015  P:   Monitor hemoglobin daily Protonix via tube daily Holding on tube feeds for now  HEMATOLOGIC A:   Leukocytosis  P:  Monitor hemoglobin daily with CBC Trending leukocyte count daily SCDs Heparin subcutaneous every 8 hours  INFECTIOUS A:   Severe CAP  P:   Procalcitonin Trending leukocytosis   Urine Leg Ag 9/4>> Urine Strep Ag 9/4>> Respiratory Viral PCR 9/4>>  BCx2 9/4>> UC 9/4>> Sputum 9/4>>  Abx: Vancomycin, start date 9/4, day 1/x Aztreonam, start date 9/4, day 1/x Levaquin, start date 9/4, day 1/x  Rocephin 9/3>>9/3 (given at OSH) Azithromycin 9/3>>9/3 (given at OSH)  ENDOCRINE A:   Hyperglycemia    P:   Accu-Cheks every 4 hours with instructions to  notify M.D. for blood glucose less than 80 or greater than 160   NEUROLOGIC A:   No acute issues   P:   RASS goal: 0 to -1 Propofol drip  Triglycerides daily  Fentanyl IV when necessary    FAMILY  - Updates:  Spoke with patient's father via phone this evening.   - Inter-disciplinary family meet or Palliative Care meeting due by:  9/11   TODAY'S SUMMARY: 54 year old male with reports progressive dyspnea for a few days prior to admission. Patient does have a history of respiratory failure and intubation with prolonged ventilation and ultimately tracheostomy. Patient appears to have severe community acquired pneumonia and questionable ARDS; although his BNP is elevated. Status is tenuous and he has a high potential for further clinical decompensation. Investigating the source of his pneumonia and supporting the patient  with vasopressors at this time. Patient is full code per my discussion with his father today.    I have spent a total of 37 minutes of critical care time today caring for the patient, reviewing the patient's electronic medical record as well as his records from outside hospital, updating the patient's father via phone.   Donna Christen Jamison Neighbor, M.D. [facility] Pulmonary & Critical Care Pager:  [phone] After 3pm or if no response, call [phone] 02/01/2015, 4:08 AM

## 2015-02-01 NOTE — Progress Notes (Signed)
Utilization Review Completed.Adam Benson T9/08/2014  

## 2015-02-01 NOTE — Procedures (Signed)
Central Line Insertion Procedure Note  Consent:  Procedure was performed emergently as the patient was in a state of septic shock.  Laterality:  Left Internal Jugular Vein  Description of Procedure: Patient was prepped and draped in sterile fashion.  Using bedside ultrasound the patient's left internal jugular vein was visualized.  The vein was compressible and nonpulsatile.  A total of 1.26mL of Lidocaine 1% with epinephrine was used to locally anesthetize the patient's skin.  Finder needle was then inserted into the patient's vein under direct ultrasound visualization and dark blood was aspirated.  The syringe was removed form the needle and blood flow was nonpulsatile.  The guidewire was then inserted through the needle into the patient's vein and needle was then removed.  Guidewire positioning in the vein was confirmed with bedside ultrasound. A skin nick was made with a sterile scalpel.  A dilator was passed over the guidewire into the subcutaneous tissue then removed.  The 20cm central venous catheter was then inserted over the guidewire into the vein.  The guidewire was then removed.  All ports were then aspirated and free of air before being flushed with sterile normal saline.  The ports were then clamped.  The catheter was sewen into place and a chlorhexidine bandage was applied.   Complications:  None.  Estimated Blood Loss:  Less than 5cc.  Condition Post Procedure:  Remains critically ill in the intensive care unit.

## 2015-02-01 NOTE — Progress Notes (Signed)
ANTIBIOTIC CONSULT NOTE - INITIAL  Pharmacy Consult for Vancomycin and Aztreonam Indication: rule out sepsis  Allergies  Allergen Reactions  . Penicillins     hives    Patient Measurements: Weight: (!) 376 lb (170.552 kg) Adjusted Body Weight: 115 kg  Vital Signs: Temp: 98.1 F (36.7 C) (09/04 0344) Temp Source: Oral (09/04 0344) BP: 84/48 mmHg (09/04 0345) Pulse Rate: 93 (09/04 0345) Intake/Output from previous day: 09/03 0701 - 09/04 0700 In: 505 [I.V.:5; IV Piggyback:500] Out: 475 [Urine:475] Intake/Output from this shift: Total I/O In: 505 [I.V.:5; IV Piggyback:500] Out: 475 [Urine:475]  Labs (at [facility]): WBC  20.8 Hgb  11.9 Hct  39.2 Plt  298  SCr  1.6  INR 1.4 APTT  31.4  No results for input(s): WBC, HGB, PLT, LABCREA, CREATININE in the last 72 hours. CrCl cannot be calculated (Patient has no serum creatinine result on file.). No results for input(s): VANCOTROUGH, VANCOPEAK, VANCORANDOM, GENTTROUGH, GENTPEAK, GENTRANDOM, TOBRATROUGH, TOBRAPEAK, TOBRARND, AMIKACINPEAK, AMIKACINTROU, AMIKACIN in the last 72 hours.   Microbiology: Recent Results (from the past 720 hour(s))  MRSA PCR Screening     Status: None   Collection Time: 02/01/15  2:05 AM  Result Value Ref Range Status   MRSA by PCR NEGATIVE NEGATIVE Final    Comment:        The GeneXpert MRSA Assay (FDA approved for NASAL specimens only), is one component of a comprehensive MRSA colonization surveillance program. It is not intended to diagnose MRSA infection nor to guide or monitor treatment for MRSA infections.     Medical History: Past Medical History  Diagnosis Date  . Gout   . HTN (hypertension), benign   . Chronic atrial fibrillation   . Hyperlipemia   . Nerve damage     right arm  . Obesity   . Insomnia   . GERD (gastroesophageal reflux disease)   . History of pneumonia 05/2014     two month hosp stay, vent, trach   . History of tracheostomy 06/2014>>out 07/2014     during 2016 hosp stay /vent    Medications:  Norvasc  Atenolol  Uloric  Tricor  Norco  Lyrica  Ambien  Colchine  Prilosec  Cardizem  Lasix  Lopresor  KCL    Assessment: 54 y.o. male with VDRF/PNA for empiric antibiotics  Goal of Therapy:  Vancomycin trough level 15-20 mcg/ml  Plan:  Vancomycin 2 g IV now, then 1 g IV q12h Aztreonam 2 g IV q8h  Eddie Candle 02/01/2015,3:54 AM

## 2015-02-01 NOTE — Progress Notes (Signed)
eLink Physician-Brief Progress Note Patient Name: Adam Benson DOB: 05-Feb-1961 MRN: [id number]   Date of Service  02/01/2015  HPI/Events of Note  Morbidly obese, intubated [place] [facility] for hypoxic resp failure, ABG 7.25/75/73 post intubation, failed bipap H/o prolonged ventilation on prior admit  eICU Interventions  RR increased to 25 pre transport Admit orders given Propofol/fent gtt     Intervention Category Evaluation Type: New Patient Evaluation  Dorea Duff V. 02/01/2015, 1:58 AM

## 2015-02-01 NOTE — Progress Notes (Signed)
Vent changes made by MD due to patient breathing over the vent.

## 2015-02-01 NOTE — Progress Notes (Signed)
Dr. Jamison Neighbor wanted ETT tube pulled back 2cm. RT pulled back 2cm. ETT was originally found 25cm at the lip and is now 23cm at the lip. Bilateral breath sounds heard.

## 2015-02-01 NOTE — Progress Notes (Signed)
MD ordered respiratory viral panel. Pt placed on droplet precautions.  No family to educate.  Will monitor pt.

## 2015-02-01 NOTE — Progress Notes (Addendum)
CRITICAL VALUE ALERT  Critical value received:  CO2-64.9  Date of notification:  02/01/2015  Time of notification:  0230  Critical value read back:Yes.    Nurse who received alert:  Terrilyn Saver  MD notified (1st page):  Dr. Jamison Neighbor  Time of first page:  0230  MD notified (2nd page):  Time of second page:  Responding MD:  Dr. Jamison Neighbor  Time MD responded:  0230

## 2015-02-01 NOTE — Progress Notes (Signed)
Audible moaning and growling heard from pt. RT deflated cuff and added 6 cc of air with no change. Pt has been attempting to push tube out with his tongue. RN aware. RT will continue to monitor.

## 2015-02-01 NOTE — Progress Notes (Signed)
Brief Nutrition Note  Consult received for enteral/tube feeding initiation and management.  Adult Enteral Nutrition Protocol initiated. Full assessment to follow.  Admitting Dx: RESPIRATORY FAILURE, ARDS  Body mass index is 50.98 kg/(m^2). Pt meets criteria for morbid obesity based on current BMI.  Labs:   Recent Labs Lab 02/01/15 0507  NA 137  K 4.2  CL 99*  CO2 30  BUN 18  CREATININE 1.83*  CALCIUM 8.1*  MG 1.9  PHOS 4.1  GLUCOSE 143*    Tilda Franco, MS, RD, LDN Pager: [phone] After Hours Pager: [phone]

## 2015-02-02 ENCOUNTER — Inpatient Hospital Stay (HOSPITAL_COMMUNITY): Payer: Medicaid Other

## 2015-02-02 LAB — MAGNESIUM
MAGNESIUM: 2.6 mg/dL — AB (ref 1.7–2.4)
MAGNESIUM: 2.6 mg/dL — AB (ref 1.7–2.4)
Magnesium: 2.5 mg/dL — ABNORMAL HIGH (ref 1.7–2.4)

## 2015-02-02 LAB — CBC
HCT: 34.7 % — ABNORMAL LOW (ref 39.0–52.0)
Hemoglobin: 9.6 g/dL — ABNORMAL LOW (ref 13.0–17.0)
MCH: 20.5 pg — ABNORMAL LOW (ref 26.0–34.0)
MCHC: 27.7 g/dL — ABNORMAL LOW (ref 30.0–36.0)
MCV: 74 fL — AB (ref 78.0–100.0)
PLATELETS: 315 10*3/uL (ref 150–400)
RBC: 4.69 MIL/uL (ref 4.22–5.81)
RDW: 19.6 % — ABNORMAL HIGH (ref 11.5–15.5)
WBC: 19.7 10*3/uL — AB (ref 4.0–10.5)

## 2015-02-02 LAB — BASIC METABOLIC PANEL
ANION GAP: 8 (ref 5–15)
BUN: 35 mg/dL — ABNORMAL HIGH (ref 6–20)
CO2: 31 mmol/L (ref 22–32)
Calcium: 8.3 mg/dL — ABNORMAL LOW (ref 8.9–10.3)
Chloride: 100 mmol/L — ABNORMAL LOW (ref 101–111)
Creatinine, Ser: 3.13 mg/dL — ABNORMAL HIGH (ref 0.61–1.24)
GFR, EST AFRICAN AMERICAN: 25 mL/min — AB (ref >60)
GFR, EST NON AFRICAN AMERICAN: 21 mL/min — AB (ref >60)
GLUCOSE: 118 mg/dL — AB (ref 65–99)
POTASSIUM: 4.7 mmol/L (ref 3.5–5.1)
Sodium: 139 mmol/L (ref 135–145)

## 2015-02-02 LAB — POCT I-STAT 3, ART BLOOD GAS (G3+)
Acid-Base Excess: 1 mmol/L (ref 0.0–2.0)
Acid-Base Excess: 3 mmol/L — ABNORMAL HIGH (ref 0.0–2.0)
Acid-Base Excess: 3 mmol/L — ABNORMAL HIGH (ref 0.0–2.0)
BICARBONATE: 31.7 meq/L — AB (ref 20.0–24.0)
Bicarbonate: 29.1 mEq/L — ABNORMAL HIGH (ref 20.0–24.0)
Bicarbonate: 29.7 mEq/L — ABNORMAL HIGH (ref 20.0–24.0)
O2 Saturation: 89 %
O2 Saturation: 98 %
O2 Saturation: 95 %
PCO2 ART: 54.1 mmHg — AB (ref 35.0–45.0)
PH ART: 7.187 — AB (ref 7.350–7.450)
PH ART: 7.34 — AB (ref 7.350–7.450)
PH ART: 7.331 — AB (ref 7.350–7.450)
PO2 ART: 108 mmHg — AB (ref 80.0–100.0)
Patient temperature: 99.2
TCO2: 34 mmol/L (ref 0–100)
TCO2: 31 mmol/L (ref 0–100)
TCO2: 31 mmol/L (ref 0–100)
pCO2 arterial: 83.6 mmHg (ref 35.0–45.0)
pCO2 arterial: 56.8 mmHg — ABNORMAL HIGH (ref 35.0–45.0)
pO2, Arterial: 74 mmHg — ABNORMAL LOW (ref 80.0–100.0)
pO2, Arterial: 86 mmHg (ref 80.0–100.0)

## 2015-02-02 LAB — RESPIRATORY VIRUS PANEL
ADENOVIRUS: NEGATIVE
INFLUENZA A: NEGATIVE
Influenza B: NEGATIVE
Metapneumovirus: NEGATIVE
PARAINFLUENZA 2 A: NEGATIVE
Parainfluenza 1: NEGATIVE
Parainfluenza 3: NEGATIVE
RESPIRATORY SYNCYTIAL VIRUS A: NEGATIVE
RESPIRATORY SYNCYTIAL VIRUS B: NEGATIVE
RHINOVIRUS: NEGATIVE

## 2015-02-02 LAB — GLUCOSE, CAPILLARY
GLUCOSE-CAPILLARY: 115 mg/dL — AB (ref 65–99)
GLUCOSE-CAPILLARY: 123 mg/dL — AB (ref 65–99)
GLUCOSE-CAPILLARY: 132 mg/dL — AB (ref 65–99)
GLUCOSE-CAPILLARY: 159 mg/dL — AB (ref 65–99)
GLUCOSE-CAPILLARY: 167 mg/dL — AB (ref 65–99)
GLUCOSE-CAPILLARY: 168 mg/dL — AB (ref 65–99)
Glucose-Capillary: 125 mg/dL — ABNORMAL HIGH (ref 65–99)

## 2015-02-02 LAB — BLOOD GAS, ARTERIAL
ACID-BASE EXCESS: 1.1 mmol/L (ref 0.0–2.0)
BICARBONATE: 30.2 meq/L — AB (ref 20.0–24.0)
DRAWN BY: 441371
FIO2: 1
Mechanical Rate: 35
O2 Saturation: 93.4 %
PEEP/CPAP: 14 cmH2O
Patient temperature: 100.2
RATE: 35 resp/min
TCO2: 33.4 mmol/L (ref 0–100)
VT: 400 mL
pCO2 arterial: 107 mmHg (ref 35.0–45.0)
pH, Arterial: 7.085 — CL (ref 7.350–7.450)
pO2, Arterial: 92.3 mmHg (ref 80.0–100.0)

## 2015-02-02 LAB — LEGIONELLA ANTIGEN, URINE

## 2015-02-02 LAB — TRIGLYCERIDES: TRIGLYCERIDES: 158 mg/dL — AB (ref <150)

## 2015-02-02 LAB — PHOSPHORUS
PHOSPHORUS: 5.9 mg/dL — AB (ref 2.5–4.6)
Phosphorus: 6.5 mg/dL — ABNORMAL HIGH (ref 2.5–4.6)
Phosphorus: 3.9 mg/dL (ref 2.5–4.6)

## 2015-02-02 MED ORDER — DILTIAZEM HCL 60 MG PO TABS
60.0000 mg | ORAL_TABLET | Freq: Four times a day (QID) | ORAL | Status: DC
  Administered 2015-02-02 – 2015-02-04 (×9): 60 mg via ORAL
  Filled 2015-02-02 (×12): qty 1

## 2015-02-02 MED ORDER — AMIODARONE HCL IN DEXTROSE 360-4.14 MG/200ML-% IV SOLN
30.0000 mg/h | INTRAVENOUS | Status: DC
  Administered 2015-02-02 – 2015-02-11 (×22): 30 mg/h via INTRAVENOUS
  Filled 2015-02-02 (×46): qty 200

## 2015-02-02 MED ORDER — FENTANYL CITRATE (PF) 100 MCG/2ML IJ SOLN
100.0000 ug | Freq: Once | INTRAMUSCULAR | Status: AC | PRN
Start: 2015-02-02 — End: 2015-02-19
  Administered 2015-02-19: 100 ug via INTRAVENOUS
  Filled 2015-02-02 (×2): qty 2

## 2015-02-02 MED ORDER — SODIUM CHLORIDE 0.9 % IV SOLN
1.0000 mg/h | INTRAVENOUS | Status: DC
  Administered 2015-02-02 – 2015-02-03 (×4): 6 mg/h via INTRAVENOUS
  Administered 2015-02-04 (×2): 5 mg/h via INTRAVENOUS
  Administered 2015-02-04: 4 mg/h via INTRAVENOUS
  Administered 2015-02-05: 3 mg/h via INTRAVENOUS
  Administered 2015-02-06 (×4): 7 mg/h via INTRAVENOUS
  Administered 2015-02-07: 3.5 mg/h via INTRAVENOUS
  Administered 2015-02-07: 7 mg/h via INTRAVENOUS
  Administered 2015-02-07: 8 mg/h via INTRAVENOUS
  Administered 2015-02-07: 7 mg/h via INTRAVENOUS
  Administered 2015-02-08: 6 mg/h via INTRAVENOUS
  Administered 2015-02-08: 8 mg/h via INTRAVENOUS
  Administered 2015-02-09: 4 mg/h via INTRAVENOUS
  Administered 2015-02-10: 2 mg/h via INTRAVENOUS
  Administered 2015-02-11: 5 mg/h via INTRAVENOUS
  Administered 2015-02-11 – 2015-02-12 (×5): 8 mg/h via INTRAVENOUS
  Administered 2015-02-13: 6 mg/h via INTRAVENOUS
  Administered 2015-02-13 (×2): 8 mg/h via INTRAVENOUS
  Administered 2015-02-14: 4 mg/h via INTRAVENOUS
  Administered 2015-02-14: 7 mg/h via INTRAVENOUS
  Administered 2015-02-15: 4 mg/h via INTRAVENOUS
  Administered 2015-02-16: 6 mg/h via INTRAVENOUS
  Filled 2015-02-02 (×35): qty 10

## 2015-02-02 MED ORDER — ARTIFICIAL TEARS OP OINT
1.0000 "application " | TOPICAL_OINTMENT | Freq: Three times a day (TID) | OPHTHALMIC | Status: DC
  Administered 2015-02-02 – 2015-02-05 (×9): 1 via OPHTHALMIC
  Filled 2015-02-02 (×2): qty 3.5

## 2015-02-02 MED ORDER — FENTANYL BOLUS VIA INFUSION
50.0000 ug | INTRAVENOUS | Status: DC | PRN
  Administered 2015-02-05 – 2015-02-18 (×39): 50 ug via INTRAVENOUS
  Filled 2015-02-02 (×3): qty 50

## 2015-02-02 MED ORDER — DEXTROSE 5 % IV SOLN
1.0000 g | Freq: Three times a day (TID) | INTRAVENOUS | Status: DC
  Administered 2015-02-02 – 2015-02-04 (×6): 1 g via INTRAVENOUS
  Filled 2015-02-02 (×8): qty 1

## 2015-02-02 MED ORDER — VANCOMYCIN HCL 10 G IV SOLR
1750.0000 mg | INTRAVENOUS | Status: DC
  Administered 2015-02-03 – 2015-02-05 (×3): 1750 mg via INTRAVENOUS
  Filled 2015-02-02 (×4): qty 1750

## 2015-02-02 MED ORDER — SODIUM CHLORIDE 0.9 % IV SOLN
25.0000 ug/h | INTRAVENOUS | Status: DC
  Administered 2015-02-02 – 2015-02-04 (×4): 300 ug/h via INTRAVENOUS
  Administered 2015-02-04: 200 ug/h via INTRAVENOUS
  Administered 2015-02-04: 300 ug/h via INTRAVENOUS
  Administered 2015-02-05: 175 ug/h via INTRAVENOUS
  Administered 2015-02-06 – 2015-02-08 (×8): 300 ug/h via INTRAVENOUS
  Administered 2015-02-09: 150 ug/h via INTRAVENOUS
  Administered 2015-02-09: 300 ug/h via INTRAVENOUS
  Administered 2015-02-10: 75 ug/h via INTRAVENOUS
  Administered 2015-02-11: 300 ug/h via INTRAVENOUS
  Administered 2015-02-11: 225 ug/h via INTRAVENOUS
  Administered 2015-02-11 – 2015-02-13 (×4): 300 ug/h via INTRAVENOUS
  Administered 2015-02-13: 200 ug/h via INTRAVENOUS
  Administered 2015-02-14: 300 ug/h via INTRAVENOUS
  Administered 2015-02-14: 150 ug/h via INTRAVENOUS
  Administered 2015-02-15: 200 ug/h via INTRAVENOUS
  Administered 2015-02-15 – 2015-02-16 (×2): 250 ug/h via INTRAVENOUS
  Administered 2015-02-17 (×2): 400 ug/h via INTRAVENOUS
  Administered 2015-02-17: 300 ug/h via INTRAVENOUS
  Administered 2015-02-18 (×2): 400 ug/h via INTRAVENOUS
  Administered 2015-02-18: 100 ug/h via INTRAVENOUS
  Filled 2015-02-02 (×41): qty 50

## 2015-02-02 MED ORDER — CISATRACURIUM BOLUS VIA INFUSION
0.1000 mg/kg | Freq: Once | INTRAVENOUS | Status: AC
  Administered 2015-02-02: 17.2 mg via INTRAVENOUS
  Filled 2015-02-02: qty 18

## 2015-02-02 MED ORDER — LEVOFLOXACIN IN D5W 750 MG/150ML IV SOLN
750.0000 mg | INTRAVENOUS | Status: DC
Start: 2015-02-04 — End: 2015-02-05
  Administered 2015-02-04: 750 mg via INTRAVENOUS
  Filled 2015-02-02 (×3): qty 150

## 2015-02-02 MED ORDER — MIDAZOLAM HCL 2 MG/2ML IJ SOLN: 2.0000 mg | Freq: Once | INTRAMUSCULAR | Status: DC | PRN

## 2015-02-02 MED ORDER — VITAL HIGH PROTEIN PO LIQD
1000.0000 mL | ORAL | Status: DC
  Administered 2015-02-02: 12:00:00
  Administered 2015-02-02: 1000 mL
  Administered 2015-02-03 (×2)
  Administered 2015-02-03 – 2015-02-10 (×13): 1000 mL
  Filled 2015-02-02 (×16): qty 1000

## 2015-02-02 MED ORDER — MIDAZOLAM BOLUS VIA INFUSION
2.0000 mg | INTRAVENOUS | Status: DC | PRN
  Administered 2015-02-05 – 2015-02-06 (×5): 2 mg via INTRAVENOUS
  Filled 2015-02-02 (×6): qty 2

## 2015-02-02 MED ORDER — SODIUM CHLORIDE 0.9 % IV SOLN
3.0000 ug/kg/min | INTRAVENOUS | Status: DC
  Administered 2015-02-02: 2 ug/kg/min via INTRAVENOUS
  Administered 2015-02-02: 3 ug/kg/min via INTRAVENOUS
  Administered 2015-02-03 (×2): 2 ug/kg/min via INTRAVENOUS
  Administered 2015-02-04 – 2015-02-05 (×5): 3 ug/kg/min via INTRAVENOUS
  Filled 2015-02-02 (×10): qty 20

## 2015-02-02 MED ORDER — CISATRACURIUM BESYLATE (PF) 200 MG/20ML IV SOLN: 0.5000 ug/kg/min | INTRAVENOUS | Status: DC

## 2015-02-02 MED ORDER — AMIODARONE HCL IN DEXTROSE 360-4.14 MG/200ML-% IV SOLN
60.0000 mg/h | INTRAVENOUS | Status: AC
  Administered 2015-02-02: 60 mg/h via INTRAVENOUS
  Filled 2015-02-02 (×2): qty 200

## 2015-02-02 MED ORDER — AMIODARONE LOAD VIA INFUSION
150.0000 mg | Freq: Once | INTRAVENOUS | Status: AC
  Administered 2015-02-02: 150 mg via INTRAVENOUS
  Filled 2015-02-02: qty 83.34

## 2015-02-02 MED ORDER — FENTANYL CITRATE (PF) 100 MCG/2ML IJ SOLN
100.0000 ug | Freq: Once | INTRAMUSCULAR | Status: AC
  Administered 2015-02-02: 100 ug via INTRAVENOUS

## 2015-02-02 MED ORDER — PRO-STAT SUGAR FREE PO LIQD
30.0000 mL | Freq: Three times a day (TID) | ORAL | Status: DC
  Administered 2015-02-02 – 2015-03-07 (×95): 30 mL
  Filled 2015-02-02 (×106): qty 30

## 2015-02-02 MED ORDER — MIDAZOLAM HCL 2 MG/2ML IJ SOLN
2.0000 mg | Freq: Once | INTRAMUSCULAR | Status: AC
  Administered 2015-02-02: 2 mg via INTRAVENOUS

## 2015-02-02 MED ORDER — VECURONIUM BROMIDE 10 MG IV SOLR
INTRAVENOUS | Status: AC
  Administered 2015-02-02: 10 mg via INTRAVENOUS
  Filled 2015-02-02: qty 10

## 2015-02-02 MED ORDER — VECURONIUM BROMIDE 10 MG IV SOLR
10.0000 mg | Freq: Once | INTRAVENOUS | Status: AC
  Administered 2015-02-02: 10 mg via INTRAVENOUS

## 2015-02-02 MED ORDER — SODIUM CHLORIDE 0.9 % IV SOLN
3.0000 ug/kg/min | INTRAVENOUS | Status: DC
  Filled 2015-02-02: qty 20

## 2015-02-02 NOTE — Progress Notes (Signed)
ANTIBIOTIC CONSULT NOTE - FOLLOW UP  Pharmacy Consult for Vancomycin and Aztreonam Indication: rule out sepsis  Allergies  Allergen Reactions  . Penicillins Hives and Shortness Of Breath  . Hydrochlorothiazide Other (See Comments)    hypercalemia    Patient Measurements: Height: 6' (182.9 cm) Weight: (!) 379 lb (171.913 kg) IBW/kg (Calculated) : 77.6 Adjusted Body Weight: 115 kg  Vital Signs: Temp: 100.2 F (37.9 C) (09/05 0804) Temp Source: Oral (09/05 0804) BP: 95/55 mmHg (09/05 0815) Pulse Rate: 121 (09/05 0815) Intake/Output from previous day: 09/04 0701 - 09/05 0700 In: 3296.4 [I.V.:2203.9; NG/GT:342.5; IV Piggyback:750] Out: 1225 [Urine:1225] Intake/Output from this shift: Total I/O In: 383.2 [I.V.:153.2; NG/GT:80; IV Piggyback:150] Out: 40 [Urine:40]  Labs (at [facility]): WBC  20.8 Hgb  11.9 Hct  39.2 Plt  298  SCr  1.6  INR 1.4 APTT  31.4   Recent Labs  02/01/15 0507 02/02/15 0351  WBC 19.0* 19.7*  HGB 10.4* 9.6*  PLT 311 315  CREATININE 1.83* 3.13*   Estimated Creatinine Clearance: 45.5 mL/min (by C-G formula based on Cr of 3.13). No results for input(s): VANCOTROUGH, VANCOPEAK, VANCORANDOM, GENTTROUGH, GENTPEAK, GENTRANDOM, TOBRATROUGH, TOBRAPEAK, TOBRARND, AMIKACINPEAK, AMIKACINTROU, AMIKACIN in the last 72 hours.   Microbiology: Recent Results (from the past 720 hour(s))  MRSA PCR Screening     Status: None   Collection Time: 02/01/15  2:05 AM  Result Value Ref Range Status   MRSA by PCR NEGATIVE NEGATIVE Final    Comment:        The GeneXpert MRSA Assay (FDA approved for NASAL specimens only), is one component of a comprehensive MRSA colonization surveillance program. It is not intended to diagnose MRSA infection nor to guide or monitor treatment for MRSA infections.   Culture, respiratory (NON-Expectorated)     Status: None (Preliminary result)   Collection Time: 02/01/15  4:07 AM  Result Value Ref Range Status   Specimen Description TRACHEAL ASPIRATE  Final   Special Requests Normal  Final   Gram Stain PENDING  Incomplete   Culture   Final    Culture reincubated for better growth Performed at [facility]    Report Status PENDING  Incomplete    Medical History: Past Medical History  Diagnosis Date  . Gout   . HTN (hypertension), benign   . Chronic atrial fibrillation   . Hyperlipemia   . Nerve damage     right arm  . Obesity   . Insomnia   . GERD (gastroesophageal reflux disease)   . History of pneumonia 05/2014     two month hosp stay, vent, trach   . 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 Peptic ulcer 04/2014    Medications:  Norvasc  Atenolol  Uloric  Tricor  Norco  Lyrica  Ambien  Colchine  Prilosec  Cardizem  Lasix  Lopresor  KCL    Assessment: 53 YOM who was admitted for respiratory failure and subsequently intubated. Currently on IV Vancomycin, Levaquin and Aztreonam for severe CAP. WBC remains elevated at 19.7. Tm 100.66F. SCr trended up to 3.13 today. Normalized CrCl ~ 30 mL/min.   9/4 Vanc>> 9/4 Aztreonam>> 9/4 Levaquin>>  9//4 urinary legionella>> IP  9/4 urinary strep>>neg 9/4 resp viral>>IP  9/4 blood>> NGTD   Goal of Therapy:  Vancomycin trough level 15-20 mcg/ml  Plan:  Decrease Vancomycin to 1750 mg IV Q 24 hours  Decrease Levaquin to  IV q48h Decrease Aztreonam to 1  g IV q8 Monitor CBC, renal fx, cultures, and clinical progress VT at [facility]   Vinnie Level, PharmD., BCPS Clinical Pharmacist Pager [phone]

## 2015-02-02 NOTE — Progress Notes (Signed)
Audible snoring coming from patient. RT checked cuff pressure and added air to cuff without improvement.  RT checked ETT placement and found it to be at 23 at the lips, where it was previously at 25.  RT deflated cuff, advanced tube back to 25 at the lips and re-inflated cuff.  No audible sounds from patient. Bilateral breath sounds noted. Tube re-secured with new tube holder.

## 2015-02-02 NOTE — Progress Notes (Signed)
eLink Physician-Brief Progress Note Patient Name: Adam Benson DOB: 1961/02/10 MRN: [id number]   Date of Service  02/02/2015  HPI/Events of Note  Pt with A fib and RVR, has been in septic shock but norepinephrine was just able to be discontinued. I will attempt to increase his diltiazem to 20 see if  he tolerates. If not then we will decrease back to 15 and consider adding amiodarone  eICU Interventions       Intervention Category Intermediate Interventions: Arrhythmia - evaluation and management  BYRUM,ROBERT S. 02/02/2015, 12:39 AM

## 2015-02-02 NOTE — Progress Notes (Signed)
CRITICAL VALUE ALERT  Critical value received:  Blood Gas-- Ph 7.187, PCo2 83.6  Date of notification:  02/02/15  Time of notification:  0400  Critical value read back:Yes.    Nurse who received alert:  Fuller Canada RN  MD notified (1st page):  Byrum  Time of first page:  0405  MD notified (2nd page):  Time of second page:  Responding MD:  Delton Coombes  Time MD responded:  [phone]

## 2015-02-02 NOTE — Progress Notes (Signed)
PULMONARY / CRITICAL CARE MEDICINE   Name: Adam Benson MRN: [id number] DOB: 14-Oct-1960    ADMISSION DATE:  02/01/2015 CONSULTATION DATE:  02/01/2015  REFERRING MD :  Jeanene Erb that heRandolph  CHIEF COMPLAINT:  Severe CAP  INITIAL PRESENTATION: Presented to [facility] after a few days of increasing dyspnea, weight gain, & lower extremity edema.  Patient initially tried on BiPAP.   STUDIES:  Portable CXR - bilateral opacities. ETT about 6 cm above carina.  SIGNIFICANT EVENTS: 9/4 - intubated 9/4 - transferred from OSH  HISTORY OF PRESENT ILLNESS:  54 year old morbidly obese male with known history of peptic ulcer disease in December 2015. Patient is currently intubated and sedated unable to provide further history. History obtained from discussion with the patient's father as well as review of electronic medical records from outside hospital. Presented with a "few days" of increasing dyspnea as well as weight gain and lower extremity edema. Patient denied any fever or chills but did have fatigue and diaphoresis. Patient denied any throat pain. He endorses a cough as well as wheezing. The patient also endorses chest pain and palpitations. He denied any nausea, vomiting, diarrhea, or bloody stool. He had a headache for approximately one week.he denied any rashes. He denied any polyuria or polydipsia. Of note the patient had respiratory failure in January 2016 and March 2016 as well. He described his chest pain as heaviness rated 8/10 at times. The pain seemingly worsened with exertion. The patient did dorsal a tan sputum. At the outside hospital he was given Lasix 40 mg IV 1, Rocephin, & azithromycin. Initially he was tried on BiPAP but ultimately had to be endotracheally intubated for worsening respiratory status.   SUBJECTIVE: No events overnight, remains profoundly hypoxemic.  VITAL SIGNS: Temp:  [97.7 F (36.5 C)-100.2 F (37.9 C)] 100.2 F (37.9 C) (09/05 0804) Pulse Rate:  [46-164]  121 (09/05 0815) Resp:  [10-36] 10 (09/05 0815) BP: (71-129)/(44-87) 95/55 mmHg (09/05 0815) SpO2:  [90 %-100 %] 94 % (09/05 0815) FiO2 (%):  [100 %] 100 % (09/05 0800) Weight:  [171.913 kg (379 lb)] 171.913 kg (379 lb) (09/05 0256) HEMODYNAMICS:   VENTILATOR SETTINGS: Vent Mode:  [-] PCV FiO2 (%):  [100 %] 100 % Set Rate:  [14 bmp-28 bmp] 22 bmp Vt Set:  [500 mL] 500 mL PEEP:  [14 cmH20] 14 cmH20 Plateau Pressure:  [22 cmH20-32 cmH20] 28 cmH20 INTAKE / OUTPUT:  Intake/Output Summary (Last 24 hours) at 02/02/15 0939 Last data filed at 02/02/15 0800  Gross per 24 hour  Intake 3401.35 ml  Output    640 ml  Net 2761.35 ml    PHYSICAL EXAMINATION: General:  Sedated. No acute distress. Morbidly obese. Very asynchronous with the ventilator. Integument:  Warm & diaphoretic. No rash on exposed skin.  Lymphatics:  No appreciated cervical or supraclavicular lymphadenoapthy. HEENT:  Moist mucus membranes. No scleral injection or icterus. Endotracheal tube in place. PERRL. Cardiovascular:  Regular rate. No appreciable JVD. Atrial fibrillation on telemetry. Pulmonary:  Coarse breath sounds bilaterally. Symmetric chest wall rise on ventilator.  Abdomen: Soft. Normal bowel sounds. Protuberant.  Musculoskeletal:  Normal bulk. No joint deformity or effusion appreciated. Neurological:  Pupils reactive. Sedated.  LABS:  CBC  Recent Labs Lab 02/01/15 0507 02/02/15 0351  WBC 19.0* 19.7*  HGB 10.4* 9.6*  HCT 35.9* 34.7*  PLT 311 315   Coag's  Recent Labs Lab 02/01/15 0507  INR 1.66*   BMET  Recent Labs Lab 02/01/15 0507 02/02/15 0351  NA  137 139  K 4.2 4.7  CL 99* 100*  CO2 30 31  BUN 18 35*  CREATININE 1.83* 3.13*  GLUCOSE 143* 118*   Electrolytes  Recent Labs Lab 02/01/15 0507 02/01/15 1220 02/01/15 2229 02/02/15 0351  CALCIUM 8.1*  --   --  8.3*  MG 1.9 3.1* 2.6* 2.6*  PHOS 4.1 3.6 5.0* 5.9*   Sepsis Markers  Recent Labs Lab 02/01/15 0507  02/01/15 0755 02/01/15 2002  LATICACIDVEN  --  1.1 1.0  PROCALCITON 1.29  --   --    ABG  Recent Labs Lab 02/01/15 0231 02/01/15 0924 02/02/15 0424  PHART 7.261* 7.296* 7.187*  PCO2ART 64.9* 58.1* 83.6*  PO2ART 65.3* 61.0* 74.0*   Liver Enzymes  Recent Labs Lab 02/01/15 0507  AST 20  ALT 11*  ALKPHOS 32*  BILITOT 0.6  ALBUMIN 3.0*   Cardiac Enzymes  Recent Labs Lab 02/01/15 0754 02/01/15 2001  TROPONINI <0.03 <0.03   Glucose  Recent Labs Lab 02/01/15 1214 02/01/15 1657 02/01/15 1944 02/02/15 0001 02/02/15 0349 02/02/15 0800  GLUCAP 151* 135* 143* 132* 125* 123*    Imaging Dg Chest Port 1 View  02/02/2015   CLINICAL DATA:  54 year old male intubated, respiratory failure and sepsis. Initial encounter.  EXAM: PORTABLE CHEST - 1 VIEW  COMPARISON:  02/01/2015 and earlier, including [facility] chest imaging 01/31/2015 and earlier.  FINDINGS: Portable AP semi upright view at 0527 hours. Endotracheal tube tip at the level the clavicles. Stable left IJ central line. EKG leads and wires looped over the chest.  Chronic low lung volumes. Stable cardiac size and mediastinal contours. Coarse and confluent bilateral pulmonary opacity in both lungs has not significantly changed since yesterday, was present on the presentation film of 01/31/2015, but has increased in the left upper lobe since that time. No superimposed pneumothorax or pleural effusion identified.  IMPRESSION: 1.  Stable lines and tubes. 2. Continued coarse and confluent diffuse pulmonary opacity, progressed since 01/31/2015.   Electronically Signed   By: Odessa Fleming M.D.   On: 02/02/2015 08:07   ASSESSMENT / PLAN:  PULMONARY OETT 9/4>> A: Acute on chronic hypoxic respiratory failure Severe community acquired pneumonia Very asynchronous, worsening hypercarbia and hypoxemia.  P:   - Vent bundle. - Continue ARDS protocol. - Maintain even volume status. - Xopenex nebs every 6 hours. - Paralyze, drop to  6 cc/kg with repeat gas.  I am concerned that he will become more acidotic so will draw gas in 30 minutes.  CARDIOVASCULAR R IJ CVL 9/4 A:  Septic shock Elevated BNP History of atrial fibrillation Suspected diastolic congestive heart failure  P:  - Checking transthoracic echocardiogram EF of 50-55%. - Maintaining mean arterial pressure greater than 65 with norepinephrine. - Monitor rhythm and rate on telemetry  RENAL A:  Chronic kidney disease  P:   - Trending BUN/creatinine daily - Monitor urine output with Foley catheter - Monitoring electrolytes daily - Replace electrolytes as indicated.  GASTROINTESTINAL A:   History of peptic ulcer disease December 2015  P:    - Monitor hemoglobin daily - Protonix via tube daily - TF per nutrition.  HEMATOLOGIC A:   Leukocytosis  P:  - Monitor hemoglobin daily with CBC - Trending leukocyte count daily - SCDs - Heparin subcutaneous every 8 hours  INFECTIOUS A:   Severe CAP  P:   - Procalcitonin 1.29 - Trending leukocytosis   Urine Leg Ag 9/4>>pending Urine Strep Ag 9/4>>negative Respiratory Viral PCR 9/4>>pending  BCx2 9/4>>  UC 9/4>> Sputum 9/4>>  Abx: Vancomycin 9/4>>> Aztreonam 9/4>>> Levaquin 9/4>>>  Rocephin 9/3>>9/3 (given at OSH) Azithromycin 9/3>>9/3 (given at OSH)  ENDOCRINE A:   Hyperglycemia    P:   Accu-Cheks every 4 hours with instructions to notify M.D. for blood glucose less than 80 or greater than 160   NEUROLOGIC A:   No acute issues   P:   - RASS goal: -4 - Versed and fentanyl drip. - Nimbex drip. - Monitor for now.  FAMILY  - Updates:  No family bedside.  - Inter-disciplinary family meet or Palliative Care meeting due by:  9/11  The patient is critically ill with multiple organ systems failure and requires high complexity decision making for assessment and support, frequent evaluation and titration of therapies, application of advanced monitoring technologies and extensive  interpretation of multiple databases.   Critical Care Time devoted to patient care services described in this note is  35  Minutes. This time reflects time of care of this signee Dr Koren Bound. This critical care time does not reflect procedure time, or teaching time or supervisory time of PA/NP/Med student/Med Resident etc but could involve care discussion time.  Alyson Reedy, M.D. [facility] Pulmonary/Critical Care Medicine. Pager: [phone]. After hours pager: [phone].

## 2015-02-02 NOTE — Progress Notes (Signed)
eLink Physician-Brief Progress Note Patient Name: Adam Benson DOB: Mar 06, 1961 MRN: [id number]   Date of Service  02/02/2015  HPI/Events of Note  Resp acidosis on PCV.   eICU Interventions  RR increased to 22. Vent orders changed to reflect that he is on PCV     Intervention Category Major Interventions: Acid-Base disturbance - evaluation and management  Octavis Sheeler S. 02/02/2015, 4:35 AM

## 2015-02-02 NOTE — Progress Notes (Signed)
eLink Physician-Brief Progress Note Patient Name: Lelan Janeczek DOB: [birth year] MRN: [id number]   Date of Service  02/02/2015  HPI/Events of Note    eICU Interventions  Will try amio bolus and infusion, follow tele     Intervention Category Intermediate Interventions: Arrhythmia - evaluation and management  Jilliann Subramanian S. 02/02/2015, 3:49 AM

## 2015-02-02 NOTE — Progress Notes (Signed)
Initial Nutrition Assessment  DOCUMENTATION CODES:   Morbid obesity  INTERVENTION:    Continue TF via OGT with Vital High Protein, increase to goal rate of 75 ml/h (1800 ml per day) with Prostat 30 ml TID to provide 2100 kcals, 203 gm protein, 1505 ml free water daily.  NUTRITION DIAGNOSIS:   Inadequate oral intake related to inability to eat as evidenced by NPO status.  GOAL:   Provide needs based on ASPEN/SCCM guidelines  MONITOR:   TF tolerance, Vent status, Labs, Weight trends  REASON FOR ASSESSMENT:   Consult Enteral/tube feeding initiation and management  ASSESSMENT:   Presented to [facility] after a few days of increasing dyspnea, weight gain, & lower extremity edema. Patient initially tried on BiPAP. Required intubation and transferred to [facility] on 9/4.  Labs reviewed: BUN, creatinine, phosphorus, magnesium elevated.  Patient is currently intubated on ventilator support MV: 9.9 L/min Temp (24hrs), Avg:98.9 F (37.2 C), Min:97.7 F (36.5 C), Max:100.2 F (37.9 C)   Diet Order:  Diet NPO time specified  Skin:  Reviewed, no issues  Last BM:  unknown  Height:   Ht Readings from Last 1 Encounters:  02/01/15 6' (1.829 m)    Weight:   Wt Readings from Last 1 Encounters:  02/02/15 379 lb (171.913 kg)  June 2016 309 lb 1.4 oz (140.2 kg)  Ideal Body Weight:  80.9 kg  BMI:  Body mass index is 51.39 kg/(m^2).  Estimated Nutritional Needs:   Kcal:  7121-9758  Protein:  202 gm  Fluid:  2.5 L  EDUCATION NEEDS:   No education needs identified at this time  Joaquin Courts, RD, LDN, CNSC Pager [phone] After Hours Pager [phone]

## 2015-02-02 NOTE — Progress Notes (Signed)
Changes made by MD. ABG ordered in 30 min.

## 2015-02-02 NOTE — Progress Notes (Signed)
Vent changes made per MD verbal order based on recent ABG results.

## 2015-02-03 ENCOUNTER — Inpatient Hospital Stay (HOSPITAL_COMMUNITY): Payer: Medicaid Other

## 2015-02-03 DIAGNOSIS — A419 Sepsis, unspecified organism: Principal | ICD-10-CM

## 2015-02-03 DIAGNOSIS — Z9289 Personal history of other medical treatment: Secondary | ICD-10-CM | POA: Diagnosis present

## 2015-02-03 DIAGNOSIS — R6521 Severe sepsis with septic shock: Secondary | ICD-10-CM

## 2015-02-03 DIAGNOSIS — J9601 Acute respiratory failure with hypoxia: Secondary | ICD-10-CM

## 2015-02-03 DIAGNOSIS — J80 Acute respiratory distress syndrome: Secondary | ICD-10-CM

## 2015-02-03 DIAGNOSIS — Z452 Encounter for adjustment and management of vascular access device: Secondary | ICD-10-CM | POA: Diagnosis present

## 2015-02-03 LAB — BASIC METABOLIC PANEL
ANION GAP: 7 (ref 5–15)
Anion gap: 6 (ref 5–15)
BUN: 49 mg/dL — ABNORMAL HIGH (ref 6–20)
BUN: 46 mg/dL — AB (ref 6–20)
CALCIUM: 8.4 mg/dL — AB (ref 8.9–10.3)
CHLORIDE: 103 mmol/L (ref 101–111)
CHLORIDE: 101 mmol/L (ref 101–111)
CO2: 31 mmol/L (ref 22–32)
CO2: 35 mmol/L — AB (ref 22–32)
CREATININE: 1.92 mg/dL — AB (ref 0.61–1.24)
Calcium: 8.2 mg/dL — ABNORMAL LOW (ref 8.9–10.3)
Creatinine, Ser: 2.84 mg/dL — ABNORMAL HIGH (ref 0.61–1.24)
GFR calc Af Amer: 45 mL/min — ABNORMAL LOW (ref >60)
GFR calc non Af Amer: 24 mL/min — ABNORMAL LOW (ref >60)
GFR calc non Af Amer: 39 mL/min — ABNORMAL LOW (ref >60)
GFR, EST AFRICAN AMERICAN: 28 mL/min — AB (ref >60)
GLUCOSE: 134 mg/dL — AB (ref 65–99)
Glucose, Bld: 143 mg/dL — ABNORMAL HIGH (ref 65–99)
POTASSIUM: 3.8 mmol/L (ref 3.5–5.1)
Potassium: 4.6 mmol/L (ref 3.5–5.1)
SODIUM: 142 mmol/L (ref 135–145)
Sodium: 141 mmol/L (ref 135–145)

## 2015-02-03 LAB — POCT I-STAT 3, ART BLOOD GAS (G3+)
Acid-Base Excess: 6 mmol/L — ABNORMAL HIGH (ref 0.0–2.0)
BICARBONATE: 33.8 meq/L — AB (ref 20.0–24.0)
O2 Saturation: 92 %
PO2 ART: 71 mmHg — AB (ref 80.0–100.0)
Patient temperature: 98.1
TCO2: 36 mmol/L (ref 0–100)
pCO2 arterial: 67.6 mmHg (ref 35.0–45.0)
pH, Arterial: 7.305 — ABNORMAL LOW (ref 7.350–7.450)

## 2015-02-03 LAB — BLOOD GAS, ARTERIAL
ACID-BASE EXCESS: 4.6 mmol/L — AB (ref 0.0–2.0)
BICARBONATE: 29.1 meq/L — AB (ref 20.0–24.0)
DRAWN BY: 345601
FIO2: 0.6
MECHVT: 580 mL
O2 Saturation: 95.4 %
PEEP/CPAP: 10 cmH2O
Patient temperature: 98.6
RATE: 35 resp/min
TCO2: 30.6 mmol/L (ref 0–100)
pCO2 arterial: 47.8 mmHg — ABNORMAL HIGH (ref 35.0–45.0)
pH, Arterial: 7.402 (ref 7.350–7.450)
pO2, Arterial: 78.9 mmHg — ABNORMAL LOW (ref 80.0–100.0)

## 2015-02-03 LAB — CBC
HEMATOCRIT: 31.8 % — AB (ref 39.0–52.0)
HEMOGLOBIN: 9.1 g/dL — AB (ref 13.0–17.0)
MCH: 20.3 pg — ABNORMAL LOW (ref 26.0–34.0)
MCHC: 28.6 g/dL — ABNORMAL LOW (ref 30.0–36.0)
MCV: 71 fL — ABNORMAL LOW (ref 78.0–100.0)
Platelets: 255 10*3/uL (ref 150–400)
RBC: 4.48 MIL/uL (ref 4.22–5.81)
RDW: 19.4 % — AB (ref 11.5–15.5)
WBC: 11.4 10*3/uL — AB (ref 4.0–10.5)

## 2015-02-03 LAB — GLUCOSE, CAPILLARY
GLUCOSE-CAPILLARY: 138 mg/dL — AB (ref 65–99)
Glucose-Capillary: 138 mg/dL — ABNORMAL HIGH (ref 65–99)
Glucose-Capillary: 119 mg/dL — ABNORMAL HIGH (ref 65–99)
Glucose-Capillary: 117 mg/dL — ABNORMAL HIGH (ref 65–99)
Glucose-Capillary: 277 mg/dL — ABNORMAL HIGH (ref 65–99)

## 2015-02-03 LAB — PHOSPHORUS
PHOSPHORUS: 2.8 mg/dL (ref 2.5–4.6)
PHOSPHORUS: 2.5 mg/dL (ref 2.5–4.6)
PHOSPHORUS: 3.8 mg/dL (ref 2.5–4.6)

## 2015-02-03 LAB — CULTURE, RESPIRATORY

## 2015-02-03 LAB — CULTURE, RESPIRATORY W GRAM STAIN
Gram Stain: NONE SEEN
Special Requests: NORMAL

## 2015-02-03 LAB — MAGNESIUM
MAGNESIUM: 2.5 mg/dL — AB (ref 1.7–2.4)
MAGNESIUM: 2.1 mg/dL (ref 1.7–2.4)
Magnesium: 2.4 mg/dL (ref 1.7–2.4)

## 2015-02-03 LAB — TSH: TSH: 3.379 u[IU]/mL (ref 0.350–4.500)

## 2015-02-03 MED ORDER — FUROSEMIDE 10 MG/ML IJ SOLN
60.0000 mg | Freq: Four times a day (QID) | INTRAMUSCULAR | Status: DC
  Administered 2015-02-03 – 2015-02-06 (×13): 60 mg via INTRAVENOUS
  Filled 2015-02-03 (×18): qty 6

## 2015-02-03 MED ORDER — INSULIN ASPART 100 UNIT/ML ~~LOC~~ SOLN
2.0000 [IU] | SUBCUTANEOUS | Status: DC
  Administered 2015-02-03: 6 [IU] via SUBCUTANEOUS
  Administered 2015-02-03 – 2015-02-04 (×4): 2 [IU] via SUBCUTANEOUS
  Administered 2015-02-04: 4 [IU] via SUBCUTANEOUS
  Administered 2015-02-05 (×2): 2 [IU] via SUBCUTANEOUS
  Administered 2015-02-05: 4 [IU] via SUBCUTANEOUS
  Administered 2015-02-05 – 2015-02-07 (×12): 2 [IU] via SUBCUTANEOUS
  Administered 2015-02-07: 4 [IU] via SUBCUTANEOUS
  Administered 2015-02-07 (×2): 2 [IU] via SUBCUTANEOUS
  Administered 2015-02-07 – 2015-02-08 (×3): 4 [IU] via SUBCUTANEOUS
  Administered 2015-02-08: 2 [IU] via SUBCUTANEOUS
  Administered 2015-02-08: 4 [IU] via SUBCUTANEOUS
  Administered 2015-02-08 – 2015-02-09 (×4): 2 [IU] via SUBCUTANEOUS
  Administered 2015-02-09: 4 [IU] via SUBCUTANEOUS
  Administered 2015-02-09 – 2015-02-10 (×6): 2 [IU] via SUBCUTANEOUS
  Administered 2015-02-10: 4 [IU] via SUBCUTANEOUS
  Administered 2015-02-10 – 2015-02-16 (×17): 2 [IU] via SUBCUTANEOUS
  Administered 2015-02-17: 4 [IU] via SUBCUTANEOUS
  Administered 2015-02-18 – 2015-02-19 (×6): 2 [IU] via SUBCUTANEOUS
  Administered 2015-02-19: 4 [IU] via SUBCUTANEOUS
  Administered 2015-02-19 – 2015-02-21 (×5): 2 [IU] via SUBCUTANEOUS

## 2015-02-03 MED ORDER — METOPROLOL TARTRATE 1 MG/ML IV SOLN
INTRAVENOUS | Status: AC
  Filled 2015-02-03: qty 5

## 2015-02-03 MED ORDER — METOPROLOL TARTRATE 1 MG/ML IV SOLN
5.0000 mg | Freq: Once | INTRAVENOUS | Status: AC
  Administered 2015-02-03: 5 mg via INTRAVENOUS

## 2015-02-03 NOTE — Progress Notes (Signed)
Positive blood culture reported to eMD.

## 2015-02-03 NOTE — Progress Notes (Signed)
E link MD  notified about pts heart rate. Heart rate ranging from 58's - 70's. No new orders, will continue to monitor patient.

## 2015-02-03 NOTE — Progress Notes (Signed)
PULMONARY / CRITICAL CARE MEDICINE   Name: Adam Benson MRN: [id number] DOB: Jul 01, 1960    ADMISSION DATE:  02/01/2015 CONSULTATION DATE:  02/01/2015  REFERRING MD :  Jeanene Erb that heRandolph  CHIEF COMPLAINT:  Severe CAP  INITIAL PRESENTATION: Presented to [facility] , being managed for pna, ards  STUDIES:  Portable CXR - bilateral opacities. ETT about 6 cm above carina.  SIGNIFICANT EVENTS: 9/4 - intubated 9/4 - transferred from OSH 9/5- some brady from Cardizem, amio  SUBJECTIVE: afib, hr low side, pos 3 liters, levo 3 mics  VITAL SIGNS: Temp:  [97.4 F (36.3 C)-100.1 F (37.8 C)] 97.4 F (36.3 C) (09/06 0754) Pulse Rate:  [37-129] 72 (09/06 1000) Resp:  [0-35] 35 (09/06 1000) BP: (69-126)/(38-76) 103/67 mmHg (09/06 1000) SpO2:  [90 %-100 %] 93 % (09/06 1000) FiO2 (%):  [50 %-90 %] 50 % (09/06 0749) Weight:  [176.903 kg (390 lb)] 176.903 kg (390 lb) (09/06 0453) HEMODYNAMICS: CVP:  [8 mmHg] 8 mmHg VENTILATOR SETTINGS: Vent Mode:  [-] PRVC FiO2 (%):  [50 %-90 %] 50 % Set Rate:  [35 bmp] 35 bmp Vt Set:  [580 mL-620 mL] 580 mL PEEP:  [10 cmH20] 10 cmH20 Plateau Pressure:  [21 cmH20-42 cmH20] 42 cmH20 INTAKE / OUTPUT:  Intake/Output Summary (Last 24 hours) at 02/03/15 1036 Last data filed at 02/03/15 0900  Gross per 24 hour  Intake 4393.51 ml  Output   1890 ml  Net 2503.51 ml    PHYSICAL EXAMINATION: General: sedated deep, paralyzed Neuro:paralyzed, sedated rass -5, perr 3 mm HEENT: line clean left PULM: ronchi CV: s1 s2 irr GI: soft, obese, nt, nd, no r Extremities: edema mild 1 , no rash   LABS:  CBC  Recent Labs Lab 02/01/15 0507 02/02/15 0351 02/03/15 0538  WBC 19.0* 19.7* 11.4*  HGB 10.4* 9.6* 9.1*  HCT 35.9* 34.7* 31.8*  PLT 311 315 255   Coag's  Recent Labs Lab 02/01/15 0507  INR 1.66*   BMET  Recent Labs Lab 02/01/15 0507 02/02/15 0351 02/03/15 0538  NA 137 139 141  K 4.2 4.7 3.8  CL 99* 100* 103  CO2 30 31 31    BUN 18 35* 49*  CREATININE 1.83* 3.13* 2.84*  GLUCOSE 143* 118* 143*   Electrolytes  Recent Labs Lab 02/01/15 0507  02/02/15 0351 02/02/15 0945 02/02/15 2102 02/03/15 0538  CALCIUM 8.1*  --  8.3*  --   --  8.4*  MG 1.9  < > 2.6* 2.6* 2.5* 2.4  PHOS 4.1  < > 5.9* 6.5* 3.9 2.8  < > = values in this interval not displayed. Sepsis Markers  Recent Labs Lab 02/01/15 0507 02/01/15 0755 02/01/15 2002  LATICACIDVEN  --  1.1 1.0  PROCALCITON 1.29  --   --    ABG  Recent Labs Lab 02/02/15 1357 02/02/15 1801 02/03/15 0335  PHART 7.340* 7.331* 7.402  PCO2ART 54.1* 56.8* 47.8*  PO2ART 108.0* 86.0 78.9*   Liver Enzymes  Recent Labs Lab 02/01/15 0507  AST 20  ALT 11*  ALKPHOS 32*  BILITOT 0.6  ALBUMIN 3.0*   Cardiac Enzymes  Recent Labs Lab 02/01/15 0754 02/01/15 2001  TROPONINI <0.03 <0.03   Glucose  Recent Labs Lab 02/02/15 1146 02/02/15 1543 02/02/15 1946 02/02/15 2343 02/03/15 0333 02/03/15 0749  GLUCAP 115* 168* 159* 167* 138* 119*    Imaging Dg Chest Port 1 View  02/03/2015   CLINICAL DATA:  Intubated patient with respiratory failure and septic shock, previous history of  ARDS.  EXAM: PORTABLE CHEST - 1 VIEW  COMPARISON:  Portable chest x-ray of February 02, 2015  FINDINGS: The lungs are reasonably well inflated. The interstitial markings are diffusely increased with bilateral airspace opacities as well. The cardiac silhouette remains enlarged. The pulmonary interstitial markings remain indistinct. The endotracheal tube tip lies 6 cm above the carina. The esophagogastric tube tip projects below the inferior margin of the image. The left internal jugular venous catheter tip projects at the junction of the right and left brachiocephalic veins. There are degenerative changes of the right shoulder.  IMPRESSION: Persistent bilateral interstitial and alveolar opacities not greatly changed since yesterday's study. Stable enlargement of the cardiac silhouette  with obscuration of the pulmonary vascularity. The support tubes are in reasonable position.   Electronically Signed   By: David  [place] M.D.   On: 02/03/2015 07:18   ASSESSMENT / PLAN:  PULMONARY OETT 9/4>> A: Acute on chronic hypoxic respiratory failure Severe community acquired pneumonia Severe ARDS (50% peep 10, rate 35, T V580 ) P:   - Vent bundle. - Continue ARDS protocol, maintain plat less 30  - abg reviewed, can reduce MV slight rate as we ar eon 6 cc/kg , if plat greater 30 will drop to 5 cc/kg -maintain paralysis -need to keep even to neg, cant afford pls 2 liters again next 24 hrours -consider prone positioning if to 60% peep 10 -abg to follow after T v to 6 cc/kg  CARDIOVASCULAR R IJ CVL 9/4 A:  Septic shock Elevated BNP History of atrial fibrillation Suspected diastolic congestive heart failure  P:  - Checking transthoracic echocardiogram EF of 50-55%. - Maintaining mean arterial pressure greater than 65 with norepinephrine. - levo required -regardeless of levo, need neg to even balance or wont be able to ventilate / oxygenate  RENAL A:  Chronic kidney disease ARDS P:   - Trending BUN/creatinine daily - Monitor urine output with Foley catheter - add lasix  -bmet q12h   GASTROINTESTINAL A:   History of peptic ulcer disease December 2015  P:    - Monitor hemoglobin daily - Protonix via tube daily - TF per nutrition, consider oxepa  HEMATOLOGIC A:   Leukocytosis  P:  -cbc in am  - SCDs - Heparin subcutaneous every 8 hours  INFECTIOUS A:   Severe CAP Likley contamination BC P:   - Procalcitonin 1.29 - Trending leukocytosis   Urine Leg Ag 9/4>>pending Urine Strep Ag 9/4>>negative Respiratory Viral PCR 9/4>>neg  BCx2 9/4>>1/2 GPC UC 9/4>> Sputum 9/4>>  Abx: Vancomycin 9/4>>> Aztreonam 9/4>>> Levaquin 9/4>>>  Rocephin 9/3>>9/3 (given at OSH) Azithromycin 9/3>>9/3 (given at OSH)  Repeat bc Likely to dc aztreonam in am  Dc  isolation  ENDOCRINE A:   Hyperglycemia    P:   ssi Ensure tsh done  NEUROLOGIC A:   ARDS Severe dyschrony  P:   - RASS goal: -5 - Versed and fentanyl drip. - Nimbex drip. - NO WUA  FAMILY  - Updates:  No family bedside.  - Inter-disciplinary family meet or Palliative Care meeting due by:  9/11  Ccm time 35 min   [name]. Tyson Alias, MD, FACP Pgr: [phone] [place] [facility]

## 2015-02-03 NOTE — Progress Notes (Signed)
CRITICAL VALUE ALERT  Critical value received:  Gram positive cocci in clusters anaerobic bottle    Date of notification:  02/03/2015  Time of notification:  0133  Critical value read back:Yes.    Nurse who received alert:  Janna Arch RN   MD notified : at 56, called E link and spoke with Marisue Ivan RN   who will notify MD Deterding

## 2015-02-03 NOTE — Progress Notes (Signed)
Changes per MD verbal order.

## 2015-02-04 ENCOUNTER — Inpatient Hospital Stay (HOSPITAL_COMMUNITY): Payer: Medicaid Other

## 2015-02-04 LAB — COMPREHENSIVE METABOLIC PANEL
ALK PHOS: 32 U/L — AB (ref 38–126)
ALT: 11 U/L — AB (ref 17–63)
ANION GAP: 7 (ref 5–15)
AST: 16 U/L (ref 15–41)
Albumin: 2.5 g/dL — ABNORMAL LOW (ref 3.5–5.0)
BILIRUBIN TOTAL: 0.4 mg/dL (ref 0.3–1.2)
BUN: 47 mg/dL — ABNORMAL HIGH (ref 6–20)
CALCIUM: 8.3 mg/dL — AB (ref 8.9–10.3)
CO2: 39 mmol/L — AB (ref 22–32)
CREATININE: 1.9 mg/dL — AB (ref 0.61–1.24)
Chloride: 98 mmol/L — ABNORMAL LOW (ref 101–111)
GFR, EST AFRICAN AMERICAN: 46 mL/min — AB (ref >60)
GFR, EST NON AFRICAN AMERICAN: 39 mL/min — AB (ref >60)
Glucose, Bld: 120 mg/dL — ABNORMAL HIGH (ref 65–99)
Potassium: 4.2 mmol/L (ref 3.5–5.1)
SODIUM: 144 mmol/L (ref 135–145)
TOTAL PROTEIN: 5.4 g/dL — AB (ref 6.5–8.1)

## 2015-02-04 LAB — POCT I-STAT 3, ART BLOOD GAS (G3+)
Acid-Base Excess: 11 mmol/L — ABNORMAL HIGH (ref 0.0–2.0)
Acid-Base Excess: 13 mmol/L — ABNORMAL HIGH (ref 0.0–2.0)
Bicarbonate: 40.6 mEq/L — ABNORMAL HIGH (ref 20.0–24.0)
Bicarbonate: 41.4 mEq/L — ABNORMAL HIGH (ref 20.0–24.0)
O2 SAT: 84 %
O2 Saturation: 91 %
PCO2 ART: 82.6 mmHg — AB (ref 35.0–45.0)
PH ART: 7.303 — AB (ref 7.350–7.450)
PH ART: 7.315 — AB (ref 7.350–7.450)
PO2 ART: 74 mmHg — AB (ref 80.0–100.0)
Patient temperature: 99.9
TCO2: 43 mmol/L (ref 0–100)
TCO2: 44 mmol/L (ref 0–100)
pCO2 arterial: 82.9 mmHg (ref 35.0–45.0)
pO2, Arterial: 62 mmHg — ABNORMAL LOW (ref 80.0–100.0)

## 2015-02-04 LAB — CBC WITH DIFFERENTIAL/PLATELET
BASOS ABS: 0 10*3/uL (ref 0.0–0.1)
Basophils Relative: 0 % (ref 0–1)
EOS ABS: 0.4 10*3/uL (ref 0.0–0.7)
Eosinophils Relative: 4 % (ref 0–5)
HCT: 34 % — ABNORMAL LOW (ref 39.0–52.0)
HEMOGLOBIN: 9.7 g/dL — AB (ref 13.0–17.0)
LYMPHS PCT: 17 % (ref 12–46)
Lymphs Abs: 1.6 10*3/uL (ref 0.7–4.0)
MCH: 20.6 pg — AB (ref 26.0–34.0)
MCHC: 28.5 g/dL — ABNORMAL LOW (ref 30.0–36.0)
MCV: 72.3 fL — ABNORMAL LOW (ref 78.0–100.0)
MONO ABS: 0.9 10*3/uL (ref 0.1–1.0)
Monocytes Relative: 10 % (ref 3–12)
NEUTROS PCT: 69 % (ref 43–77)
Neutro Abs: 6.4 10*3/uL (ref 1.7–7.7)
PLATELETS: 272 10*3/uL (ref 150–400)
RBC: 4.7 MIL/uL (ref 4.22–5.81)
RDW: 19.6 % — ABNORMAL HIGH (ref 11.5–15.5)
WBC: 9.3 10*3/uL (ref 4.0–10.5)

## 2015-02-04 LAB — BASIC METABOLIC PANEL
Anion gap: 7 (ref 5–15)
Anion gap: 8 (ref 5–15)
BUN: 44 mg/dL — AB (ref 6–20)
BUN: 47 mg/dL — AB (ref 6–20)
CALCIUM: 8.2 mg/dL — AB (ref 8.9–10.3)
CO2: 40 mmol/L — ABNORMAL HIGH (ref 22–32)
CO2: 42 mmol/L — ABNORMAL HIGH (ref 22–32)
CREATININE: 1.74 mg/dL — AB (ref 0.61–1.24)
CREATININE: 1.68 mg/dL — AB (ref 0.61–1.24)
Calcium: 8.7 mg/dL — ABNORMAL LOW (ref 8.9–10.3)
Chloride: 97 mmol/L — ABNORMAL LOW (ref 101–111)
Chloride: 98 mmol/L — ABNORMAL LOW (ref 101–111)
GFR calc Af Amer: 51 mL/min — ABNORMAL LOW (ref >60)
GFR calc Af Amer: 53 mL/min — ABNORMAL LOW (ref >60)
GFR, EST NON AFRICAN AMERICAN: 44 mL/min — AB (ref >60)
GFR, EST NON AFRICAN AMERICAN: 46 mL/min — AB (ref >60)
GLUCOSE: 154 mg/dL — AB (ref 65–99)
Glucose, Bld: 141 mg/dL — ABNORMAL HIGH (ref 65–99)
POTASSIUM: 4.4 mmol/L (ref 3.5–5.1)
POTASSIUM: 4 mmol/L (ref 3.5–5.1)
SODIUM: 144 mmol/L (ref 135–145)
SODIUM: 148 mmol/L — AB (ref 135–145)

## 2015-02-04 LAB — GLUCOSE, CAPILLARY
GLUCOSE-CAPILLARY: 104 mg/dL — AB (ref 65–99)
GLUCOSE-CAPILLARY: 114 mg/dL — AB (ref 65–99)
GLUCOSE-CAPILLARY: 122 mg/dL — AB (ref 65–99)
Glucose-Capillary: 149 mg/dL — ABNORMAL HIGH (ref 65–99)
Glucose-Capillary: 152 mg/dL — ABNORMAL HIGH (ref 65–99)
Glucose-Capillary: 148 mg/dL — ABNORMAL HIGH (ref 65–99)

## 2015-02-04 LAB — BRAIN NATRIURETIC PEPTIDE: B NATRIURETIC PEPTIDE 5: 145.3 pg/mL — AB (ref 0.0–100.0)

## 2015-02-04 MED ORDER — SENNOSIDES 8.8 MG/5ML PO SYRP
10.0000 mL | ORAL_SOLUTION | Freq: Two times a day (BID) | ORAL | Status: DC
  Administered 2015-02-04 – 2015-02-08 (×9): 10 mL via ORAL
  Filled 2015-02-04 (×19): qty 10

## 2015-02-04 MED ORDER — AMIODARONE IV BOLUS ONLY 150 MG/100ML
150.0000 mg | Freq: Once | INTRAVENOUS | Status: AC
  Administered 2015-02-04: 150 mg via INTRAVENOUS
  Filled 2015-02-04: qty 100

## 2015-02-04 MED ORDER — GENTAMICIN SULFATE 40 MG/ML IJ SOLN
7.0000 mg/kg | INTRAVENOUS | Status: DC
  Administered 2015-02-04: 850 mg via INTRAVENOUS
  Filled 2015-02-04 (×2): qty 21.25

## 2015-02-04 MED ORDER — ACETAMINOPHEN 500 MG PO TABS
1000.0000 mg | ORAL_TABLET | Freq: Once | ORAL | Status: AC
  Administered 2015-02-04: 1000 mg via ORAL
  Filled 2015-02-04: qty 2

## 2015-02-04 MED ORDER — DILTIAZEM LOAD VIA INFUSION
20.0000 mg | Freq: Once | INTRAVENOUS | Status: AC
  Administered 2015-02-04: 20 mg via INTRAVENOUS
  Filled 2015-02-04: qty 20

## 2015-02-04 MED ORDER — DILTIAZEM HCL 100 MG IV SOLR
5.0000 mg/h | INTRAVENOUS | Status: DC
  Administered 2015-02-04: 10 mg/h via INTRAVENOUS
  Administered 2015-02-04: 5 mg/h via INTRAVENOUS
  Administered 2015-02-04 – 2015-02-05 (×2): 10 mg/h via INTRAVENOUS
  Filled 2015-02-04 (×3): qty 100

## 2015-02-04 MED ORDER — POLYETHYLENE GLYCOL 3350 17 G PO PACK
17.0000 g | PACK | Freq: Every day | ORAL | Status: DC | PRN
  Administered 2015-02-04: 17 g via ORAL
  Filled 2015-02-04 (×2): qty 1

## 2015-02-04 NOTE — Progress Notes (Signed)
PULMONARY / CRITICAL CARE MEDICINE   Name: Adam Benson MRN: [id number] DOB: Mar 09, 1961    ADMISSION DATE:  02/01/2015 CONSULTATION DATE:  02/01/2015  REFERRING MD :  Hulen Skains that heRandolph  CHIEF COMPLAINT:  Severe CAP  INITIAL PRESENTATION: Presented to [facility] , being managed for pna, ards  STUDIES:  Portable CXR - bilateral opacities. ETT about 6 cm above carina.  SIGNIFICANT EVENTS: 9/4 - intubated 9/4 - transferred from OSH 9/5- some brady from Cardizem, amio 9/6- lasix started 9/7- fib rvr, neg 4.6 liters  SUBJECTIVE: rvr  VITAL SIGNS: Temp:  [98.1 F (36.7 C)-102.6 F (39.2 C)] 102.6 F (39.2 C) (09/07 1107) Pulse Rate:  [93-153] 148 (09/07 1100) Resp:  [5-31] 5 (09/07 1100) BP: (89-126)/(43-76) 126/67 mmHg (09/07 1100) SpO2:  [83 %-95 %] 94 % (09/07 1100) FiO2 (%):  [50 %-60 %] 60 % (09/07 0809) Weight:  [185.521 kg (409 lb)] 185.521 kg (409 lb) (09/07 0342) HEMODYNAMICS:   VENTILATOR SETTINGS: Vent Mode:  [-] PRVC FiO2 (%):  [50 %-60 %] 60 % Set Rate:  [30 bmp] 30 bmp Vt Set:  [470 mL] 470 mL PEEP:  [10 cmH20] 10 cmH20 Plateau Pressure:  [18 cmH20-33 cmH20] 18 cmH20 INTAKE / OUTPUT:  Intake/Output Summary (Last 24 hours) at 02/04/15 1150 Last data filed at 02/04/15 1100  Gross per 24 hour  Intake 4983.64 ml  Output   9600 ml  Net -4616.36 ml    PHYSICAL EXAMINATION: General: sedated deep, paralyzed Neuro:paralyzed, sedated rass -5, perr HEENT: line clean left PULM: ronchi improved CV: s1 s2 irr GI: soft, obese, nt, nd, no r Extremities: edema mild 1 , no rash   LABS:  CBC  Recent Labs Lab 02/02/15 0351 02/03/15 0538 02/04/15 0402  WBC 19.7* 11.4* 9.3  HGB 9.6* 9.1* 9.7*  HCT 34.7* 31.8* 34.0*  PLT 315 255 272   Coag's  Recent Labs Lab 02/01/15 0507  INR 1.66*   BMET  Recent Labs Lab 02/03/15 2047 02/04/15 0402 02/04/15 0945  NA 142 144 144  K 4.6 4.2 4.4  CL 101 98* 97*  CO2 35* 39* 40*  BUN 46* 47* 44*   CREATININE 1.92* 1.90* 1.74*  GLUCOSE 134* 120* 141*   Electrolytes  Recent Labs Lab 02/03/15 0538 02/03/15 1014 02/03/15 2047 02/04/15 0402 02/04/15 0945  CALCIUM 8.4*  --  8.2* 8.3* 8.2*  MG 2.4 2.5* 2.1  --   --   PHOS 2.8 2.5 3.8  --   --    Sepsis Markers  Recent Labs Lab 02/01/15 0507 02/01/15 0755 02/01/15 2002  LATICACIDVEN  --  1.1 1.0  PROCALCITON 1.29  --   --    ABG  Recent Labs Lab 02/03/15 1243 02/04/15 0431 02/04/15 0821  PHART 7.305* 7.303* 7.315*  PCO2ART 67.6* 82.6* 82.9*  PO2ART 71.0* 74.0* 62.0*   Liver Enzymes  Recent Labs Lab 02/01/15 0507 02/04/15 0402  AST 20 16  ALT 11* 11*  ALKPHOS 32* 32*  BILITOT 0.6 0.4  ALBUMIN 3.0* 2.5*   Cardiac Enzymes  Recent Labs Lab 02/01/15 0754 02/01/15 2001  TROPONINI <0.03 <0.03   Glucose  Recent Labs Lab 02/03/15 1545 02/03/15 2040 02/03/15 2339 02/04/15 0359 02/04/15 0754 02/04/15 1105  GLUCAP 277* 138* 104* 122* 114* 149*    Imaging Dg Chest Port 1 View  02/04/2015   CLINICAL DATA:  Evaluate endotracheal tube. ARDS. Respiratory failure.  EXAM: PORTABLE CHEST - 1 VIEW  COMPARISON:  02/03/2015.  FINDINGS: Support apparatus: Endotracheal  tube is unchanged. This radiograph is in reverse lordotic projection in the endotracheal tube tip is at the level of clavicles, probably unchanged from yesterday. LEFT IJ central line present. There is an enteric tube is difficult to visualize. Monitoring leads project over the chest.  Cardiomediastinal Silhouette: Partially obscured but grossly unchanged in size.  Lungs: Diffuse right-greater-than-left bilateral basilar predominant airspace disease. No interval change allowing for differences in technique. No pneumothorax.  Effusions:  None grossly visible.  Other:  None.  IMPRESSION: 1. Stable support apparatus. 2. Diffuse right-greater-than-left bilateral airspace disease. No interval change in the heart and lungs compared to yesterday's exam allowing  for differences in projection.   Electronically Signed   By: Dereck Ligas M.D.   On: 02/04/2015 06:44   ASSESSMENT / PLAN:  PULMONARY OETT 9/4>> A: Acute on chronic hypoxic respiratory failure Severe community acquired pneumonia Severe ARDS (50% peep 10, rate 35, T V580 ) P:   - still requires paralysis -keep neg balance , did well overnight -470, 30, 60% peep 10, abg reviewed, keep same MV, may need peep to 12 to recruit further -pcxr in am  Goal ph met  CARDIOVASCULAR R IJ CVL 9/4>>> A:  Septic shock Elevated BNP History of atrial fibrillation Suspected diastolic congestive heart failure Off pressors P:  -maintain amio, bolus Control fevers -dc oral cardizem , add IV drip, bolus - levo off, did well with lasix  RENAL A:  Chronic kidney disease ARDS P:   -kvo -keep same dose lasix  -bmet q12h   GASTROINTESTINAL A:   History of peptic ulcer disease December 2015  P:    - Protonix via tube daily - TF per nutrition, consider oxepa, will d/w nutrition -need BM, add coalce, mirl  HEMATOLOGIC A:   Leukocytosis hemoconcetration P:  -cbc in am  - SCDs - Heparin subcutaneous every 8 hours  INFECTIOUS A:   Severe CAP Likley contamination BC Spiking fever, missing organism pcn allergy P:   Urine Leg Ag 9/4>>pending Urine Strep Ag 9/4>>negative Respiratory Viral PCR 9/4>>neg  BCx2 9/4>>1/2 GPC UC 9/4>> Sputum 9/4>>  Abx: Vancomycin 9/4>>> Aztreonam 9/4>>>9/7 Levaquin 9/4>>>  Rocephin 9/3>>9/3 (given at OSH) Azithromycin 9/3>>9/3 (given at OSH)  Dc aztreonam Consider imipenem but sob with pcn consider 5-7 mg/kg gent no other options  ENDOCRINE A:   Hyperglycemia    P:   ssi Ensure tsh done - wnl  NEUROLOGIC A:   ARDS Severe dyschrony  P:   - RASS goal: -5 - Versed and fentanyl drip. - Nimbex drip required still - NO WUA  FAMILY  - Updates:  Dad updated by me  - Inter-disciplinary family meet or Palliative Care meeting  due by:  9/11  Ccm time 35 min   Lavon Paganini. Titus Mould, MD, [place] Pgr: [facility]

## 2015-02-04 NOTE — Clinical Documentation Improvement (Signed)
  Critical Care Query 1 of 2   Possible Clinical Conditions:  - Acute Kidney Injury secondary to ATN  - Acute Kidney Injury  - Other condition  - Unable to clinically determine  Clinical Information: Septic Shock requiring pressors Creatinine 02/02/15 - 3.13 mg/dL    Creatinine on admission 02/01/15 - 1.83 mg/dL Increase in creatinine in 24 hours - 1.30 mg/dL  Query 2 of 2 Please document the ACUITY of the patient's Diastolic Heart Failure.  Clinical Information: "Suspected diastolic congestive heart failure" is documented in the MD progress notes. Echo dated 02/01/15 - EF 50-55%;  Unable to determine LV diastolic dysfunction due to atrial fibrillation   Thank You,  Jerral Ralph  RN BSN CCDS [phone] Health Information Management [place]  Please exercise your independent, professional judgment when responding. A specific answer is not anticipated or expected.

## 2015-02-04 NOTE — Progress Notes (Signed)
Notified MD Deterding of PCO2: 82.6. Will continue to monitor and assess.

## 2015-02-04 NOTE — Progress Notes (Signed)
ANTIBIOTIC CONSULT NOTE - INITIAL  Pharmacy Consult for Gentamicin Indication: Sepsis   Allergies  Allergen Reactions  . Penicillins Hives and Shortness Of Breath  . Hydrochlorothiazide Other (See Comments)    hypercalemia    Patient Measurements: Height: 6' (182.9 cm) Weight: (!) 409 lb (185.521 kg) IBW/kg (Calculated) : 77.6 Adjusted Body Weight: 120 kg   Vital Signs: Temp: 102.6 F (39.2 C) (09/07 1107) Temp Source: Oral (09/07 1107) BP: 126/67 mmHg (09/07 1100) Pulse Rate: 148 (09/07 1100) Intake/Output from previous day: 09/06 0701 - 09/07 0700 In: 4819.6 [I.V.:2169.6; NG/GT:2000; IV Piggyback:650] Out: 8800 [Urine:8800] Intake/Output from this shift: Total I/O In: 860.4 [I.V.:410.4; NG/GT:300; IV Piggyback:150] Out: 1200 [Urine:1200]  Labs:  Recent Labs  02/02/15 0351 02/03/15 0538 02/03/15 2047 02/04/15 0402 02/04/15 0945  WBC 19.7* 11.4*  --  9.3  --   HGB 9.6* 9.1*  --  9.7*  --   PLT 315 255  --  272  --   CREATININE 3.13* 2.84* 1.92* 1.90* 1.74*   Estimated Creatinine Clearance: 85.8 mL/min (by C-G formula based on Cr of 1.74). No results for input(s): VANCOTROUGH, VANCOPEAK, VANCORANDOM, GENTTROUGH, GENTPEAK, GENTRANDOM, TOBRATROUGH, TOBRAPEAK, TOBRARND, AMIKACINPEAK, AMIKACINTROU, AMIKACIN in the last 72 hours.   Microbiology: Recent Results (from the past 720 hour(s))  MRSA PCR Screening     Status: None   Collection Time: 02/01/15  2:05 AM  Result Value Ref Range Status   MRSA by PCR NEGATIVE NEGATIVE Final    Comment:        The GeneXpert MRSA Assay (FDA approved for NASAL specimens only), is one component of a comprehensive MRSA colonization surveillance program. It is not intended to diagnose MRSA infection nor to guide or monitor treatment for MRSA infections.   Culture, respiratory (NON-Expectorated)     Status: None   Collection Time: 02/01/15  4:07 AM  Result Value Ref Range Status   Specimen Description TRACHEAL ASPIRATE   Final   Special Requests Normal  Final   Gram Stain   Final    NO WBC SEEN NO SQUAMOUS EPITHELIAL CELLS SEEN NO ORGANISMS SEEN Performed at [facility]    Culture   Final    Non-Pathogenic Oropharyngeal-type Flora Isolated. Performed at [facility]    Report Status 02/03/2015 FINAL  Final  Respiratory virus panel     Status: None   Collection Time: 02/01/15  4:09 AM  Result Value Ref Range Status   Source - RVPAN NASAL SWAB  Corrected   Respiratory Syncytial Virus A Negative Negative Final   Respiratory Syncytial Virus B Negative Negative Final   Influenza A Negative Negative Final   Influenza B Negative Negative Final   Parainfluenza 1 Negative Negative Final   Parainfluenza 2 Negative Negative Final   Parainfluenza 3 Negative Negative Final   Metapneumovirus Negative Negative Final   Rhinovirus Negative Negative Final   Adenovirus Negative Negative Final    Comment: (NOTE) Performed At: [facility] [address] [place], [place] [id number] Mila Homer MD WU:[id number]   Culture, blood (routine x 2)     Status: None (Preliminary result)   Collection Time: 02/01/15  4:30 AM  Result Value Ref Range Status   Specimen Description BLOOD L IJ CVC  Final   Special Requests BOTTLES DRAWN AEROBIC AND ANAEROBIC 10CC  Final   Culture  Setup Time   Final    GRAM POSITIVE COCCI IN CLUSTERS ANAEROBIC BOTTLE ONLY CRITICAL RESULT CALLED TO, READ BACK BY AND  VERIFIED WITH: J [id number]  02/03/15 M KELLY    Culture   Final    STAPHYLOCOCCUS SPECIES (COAGULASE NEGATIVE) THE SIGNIFICANCE OF ISOLATING THIS ORGANISM FROM A SINGLE SET OF BLOOD CULTURES WHEN MULTIPLE SETS ARE DRAWN IS UNCERTAIN. PLEASE NOTIFY THE MICROBIOLOGY DEPARTMENT WITHIN ONE WEEK IF SPECIATION AND SENSITIVITIES ARE REQUIRED.    Report Status PENDING  Incomplete  Culture, blood (routine x 2)     Status: None (Preliminary result)   Collection Time: 02/01/15  4:49 AM  Result Value Ref  Range Status   Specimen Description BLOOD LEFT HAND  Final   Special Requests BOTTLES DRAWN AEROBIC AND ANAEROBIC 5CC  Final   Culture NO GROWTH 2 DAYS  Final   Report Status PENDING  Incomplete    Medical History: Past Medical History  Diagnosis Date  . Gout   . HTN (hypertension), benign   . Chronic atrial fibrillation   . Hyperlipemia   . Nerve damage     right arm  . Obesity   . Insomnia   . GERD (gastroesophageal reflux disease)   . History of pneumonia 05/2014     two month hosp stay, vent, trach   . 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 Peptic ulcer 04/2014    Medications:  Prescriptions prior to admission  Medication Sig Dispense Refill Last Dose  . colchicine 0.6 MG tablet Take 0.6 mg by mouth 2 (two) times daily.   Past Week at Unknown time  . diltiazem (CARDIZEM CD) 240 MG 24 hr capsule Take 240 mg by mouth daily.   Past Week at Unknown time  . Febuxostat 80 MG TABS Take 80 mg by mouth daily.   Past Week at Unknown time  . fenofibrate (TRICOR) 145 MG tablet Take 145 mg by mouth daily.   Past Week at Unknown time  . furosemide (LASIX) 20 MG tablet Take 20 mg by mouth daily.   Past Week at Unknown time  . HYDROcodone-acetaminophen (NORCO) 10-325 MG per tablet Take 1 tablet by mouth every 6 (six) hours as needed.   Past Week at Unknown time  . metoprolol tartrate (LOPRESSOR) 25 MG tablet Take 25 mg by mouth 2 (two) times daily.   Past Week at Unknown time  . omeprazole (PRILOSEC) 20 MG capsule Take 20 mg by mouth 2 (two) times daily before a meal.   Past Week at Unknown time  . pregabalin (LYRICA) 200 MG capsule Take 200 mg by mouth. Take 1 capsule three times a day.   Past Week at Unknown time  . rivaroxaban (XARELTO) 20 MG TABS tablet Take 20 mg by mouth daily with supper.   unk  . testosterone cypionate (DEPOTESTOSTERONE CYPIONATE) 200 MG/ML injection Inject 200 mg into the muscle every 28 (twenty-eight) days.    unk    Assessment: 76 YOM with severe CAP was on Day #4 of empiric therapy with Aztreonam, Vancomycin and Levaquin. Patient continues to spike fevers. Given patient's allergy to penicillins, now stopping aztreonam and switching to gentamicin. WBC elevated but down to 11.4 today. Tm 102.46F  9/4 Vanc>> 9/4 Aztreonam>>9/7 9/4 Levaquin>> 9/7 Gent>>   9//4 urinary legionella>> neg  9/4 urinary strep>>neg 9/4 resp viral>>neg  9/4 blood>> 1/2 CNS   Goal of Therapy:  Resolution of infection   Plan:  -Start gentamicin 7 mg/kg (850 mg) Q 24 hours -F/u 10 hr gentamicin level and adjust accordingly -Monitor CBC, renal fx, cultures  -Continue Vancomycin to 1750  mg IV Q 24 hours  Vinnie Level, PharmD., BCPS Clinical Pharmacist Pager [phone]

## 2015-02-05 ENCOUNTER — Inpatient Hospital Stay (HOSPITAL_COMMUNITY): Payer: Medicaid Other

## 2015-02-05 LAB — URINALYSIS, ROUTINE W REFLEX MICROSCOPIC
BILIRUBIN URINE: NEGATIVE
Glucose, UA: NEGATIVE mg/dL
Hgb urine dipstick: NEGATIVE
KETONES UR: NEGATIVE mg/dL
Leukocytes, UA: NEGATIVE
NITRITE: NEGATIVE
Protein, ur: NEGATIVE mg/dL
SPECIFIC GRAVITY, URINE: 1.016 (ref 1.005–1.030)
UROBILINOGEN UA: 0.2 mg/dL (ref 0.0–1.0)
pH: 6 (ref 5.0–8.0)

## 2015-02-05 LAB — GLUCOSE, CAPILLARY
GLUCOSE-CAPILLARY: 122 mg/dL — AB (ref 65–99)
GLUCOSE-CAPILLARY: 129 mg/dL — AB (ref 65–99)
GLUCOSE-CAPILLARY: 133 mg/dL — AB (ref 65–99)
Glucose-Capillary: 175 mg/dL — ABNORMAL HIGH (ref 65–99)
Glucose-Capillary: 122 mg/dL — ABNORMAL HIGH (ref 65–99)
Glucose-Capillary: 150 mg/dL — ABNORMAL HIGH (ref 65–99)

## 2015-02-05 LAB — CBC WITH DIFFERENTIAL/PLATELET
BASOS ABS: 0 10*3/uL (ref 0.0–0.1)
BASOS PCT: 0 % (ref 0–1)
EOS ABS: 0.3 10*3/uL (ref 0.0–0.7)
Eosinophils Relative: 3 % (ref 0–5)
HCT: 38.2 % — ABNORMAL LOW (ref 39.0–52.0)
HEMOGLOBIN: 10.8 g/dL — AB (ref 13.0–17.0)
LYMPHS PCT: 13 % (ref 12–46)
Lymphs Abs: 1.2 10*3/uL (ref 0.7–4.0)
MCH: 20.5 pg — ABNORMAL LOW (ref 26.0–34.0)
MCHC: 28.3 g/dL — ABNORMAL LOW (ref 30.0–36.0)
MCV: 72.6 fL — ABNORMAL LOW (ref 78.0–100.0)
Monocytes Absolute: 0.9 10*3/uL (ref 0.1–1.0)
Monocytes Relative: 10 % (ref 3–12)
NEUTROS PCT: 74 % (ref 43–77)
Neutro Abs: 6.6 10*3/uL (ref 1.7–7.7)
Platelets: 267 10*3/uL (ref 150–400)
RBC: 5.26 MIL/uL (ref 4.22–5.81)
RDW: 19.5 % — ABNORMAL HIGH (ref 11.5–15.5)
WBC: 9 10*3/uL (ref 4.0–10.5)

## 2015-02-05 LAB — BASIC METABOLIC PANEL WITH GFR
Anion gap: 9 (ref 5–15)
BUN: 50 mg/dL — ABNORMAL HIGH (ref 6–20)
CO2: 45 mmol/L — ABNORMAL HIGH (ref 22–32)
Calcium: 9 mg/dL (ref 8.9–10.3)
Chloride: 98 mmol/L — ABNORMAL LOW (ref 101–111)
Creatinine, Ser: 1.5 mg/dL — ABNORMAL HIGH (ref 0.61–1.24)
GFR calc Af Amer: >60 mL/min
GFR calc non Af Amer: 52 mL/min — ABNORMAL LOW
Glucose, Bld: 166 mg/dL — ABNORMAL HIGH (ref 65–99)
Potassium: 4.3 mmol/L (ref 3.5–5.1)
Sodium: 152 mmol/L — ABNORMAL HIGH (ref 135–145)

## 2015-02-05 LAB — CULTURE, BLOOD (ROUTINE X 2)

## 2015-02-05 LAB — COMPREHENSIVE METABOLIC PANEL WITH GFR
ALT: 16 U/L — ABNORMAL LOW (ref 17–63)
AST: 20 U/L (ref 15–41)
Albumin: 2.5 g/dL — ABNORMAL LOW (ref 3.5–5.0)
Alkaline Phosphatase: 34 U/L — ABNORMAL LOW (ref 38–126)
Anion gap: 8 (ref 5–15)
BUN: 46 mg/dL — ABNORMAL HIGH (ref 6–20)
CO2: 43 mmol/L — ABNORMAL HIGH (ref 22–32)
Calcium: 8.8 mg/dL — ABNORMAL LOW (ref 8.9–10.3)
Chloride: 97 mmol/L — ABNORMAL LOW (ref 101–111)
Creatinine, Ser: 1.29 mg/dL — ABNORMAL HIGH (ref 0.61–1.24)
GFR calc Af Amer: >60 mL/min
GFR calc non Af Amer: >60 mL/min
Glucose, Bld: 132 mg/dL — ABNORMAL HIGH (ref 65–99)
Potassium: 3.3 mmol/L — ABNORMAL LOW (ref 3.5–5.1)
Sodium: 148 mmol/L — ABNORMAL HIGH (ref 135–145)
Total Bilirubin: 0.7 mg/dL (ref 0.3–1.2)
Total Protein: 5.9 g/dL — ABNORMAL LOW (ref 6.5–8.1)

## 2015-02-05 LAB — POCT I-STAT 3, ART BLOOD GAS (G3+)
ACID-BASE EXCESS: 16 mmol/L — AB (ref 0.0–2.0)
Bicarbonate: 43.1 mEq/L — ABNORMAL HIGH (ref 20.0–24.0)
O2 SAT: 93 %
PH ART: 7.461 — AB (ref 7.350–7.450)
Patient temperature: 97.6
TCO2: 45 mmol/L (ref 0–100)
pCO2 arterial: 60.2 mmHg (ref 35.0–45.0)
pO2, Arterial: 63 mmHg — ABNORMAL LOW (ref 80.0–100.0)

## 2015-02-05 LAB — MAGNESIUM: Magnesium: 2 mg/dL (ref 1.7–2.4)

## 2015-02-05 LAB — GENTAMICIN LEVEL, RANDOM: Gentamicin Rm: 11.7 ug/mL

## 2015-02-05 MED ORDER — AMIODARONE IV BOLUS ONLY 150 MG/100ML
150.0000 mg | Freq: Once | INTRAVENOUS | Status: AC
  Administered 2015-02-05: 150 mg via INTRAVENOUS

## 2015-02-05 MED ORDER — DILTIAZEM HCL 30 MG PO TABS
30.0000 mg | ORAL_TABLET | Freq: Two times a day (BID) | ORAL | Status: DC
  Administered 2015-02-05: 30 mg via ORAL
  Filled 2015-02-05 (×2): qty 1

## 2015-02-05 MED ORDER — POTASSIUM CHLORIDE 20 MEQ/15ML (10%) PO SOLN
40.0000 meq | ORAL | Status: AC
  Administered 2015-02-05 (×2): 40 meq via ORAL
  Filled 2015-02-05 (×2): qty 30

## 2015-02-05 MED ORDER — LEVOFLOXACIN IN D5W 750 MG/150ML IV SOLN
750.0000 mg | INTRAVENOUS | Status: AC
  Administered 2015-02-05 – 2015-02-08 (×4): 750 mg via INTRAVENOUS
  Filled 2015-02-05 (×4): qty 150

## 2015-02-05 MED ORDER — GENTAMICIN SULFATE 40 MG/ML IJ SOLN
200.0000 mg | Freq: Two times a day (BID) | INTRAVENOUS | Status: DC
  Filled 2015-02-05: qty 5

## 2015-02-05 MED ORDER — DILTIAZEM HCL 100 MG IV SOLR
5.0000 mg/h | INTRAVENOUS | Status: DC
  Administered 2015-02-05: 5 mg/h via INTRAVENOUS
  Administered 2015-02-06 – 2015-02-07 (×6): 20 mg/h via INTRAVENOUS
  Administered 2015-02-07: 10 mg/h via INTRAVENOUS
  Administered 2015-02-07: 15 mg/h via INTRAVENOUS
  Administered 2015-02-07: 20 mg/h via INTRAVENOUS
  Administered 2015-02-08: 5 mg/h via INTRAVENOUS
  Administered 2015-02-09: 15 mg/h via INTRAVENOUS
  Administered 2015-02-09 (×3): 20 mg/h via INTRAVENOUS
  Administered 2015-02-10: 15 mg/h via INTRAVENOUS
  Administered 2015-02-10: 17.5 mg/h via INTRAVENOUS
  Administered 2015-02-10: 15 mg/h via INTRAVENOUS
  Administered 2015-02-10 – 2015-02-11 (×3): 20 mg/h via INTRAVENOUS
  Administered 2015-02-11: 5 mg/h via INTRAVENOUS
  Administered 2015-02-11: 17.5 mg/h via INTRAVENOUS
  Administered 2015-02-12: 10 mg/h via INTRAVENOUS
  Filled 2015-02-05 (×27): qty 100

## 2015-02-05 NOTE — Progress Notes (Signed)
eLink Physician-Brief Progress Note Patient Name: Adam Benson DOB: 01/30/1961 MRN: [id number]   Date of Service  02/05/2015  HPI/Events of Note  Called d/t AFIB with ventricular rate now in 160's. K+ = 4.3 and Mg++ = 2.0. Already on an Amiodarone IV infusion and Cardizem PO. Given Amiodarone re-bolus earlier without effect.   eICU Interventions  Will order: 1. D/C Cardizem PO. 2. Cardizem IV infusion. Titrate to HR 65 -115.      Intervention Category Major Interventions: Arrhythmia - evaluation and management  Kalli Greenfield Eugene 02/05/2015, 9:16 PM

## 2015-02-05 NOTE — Progress Notes (Signed)
PULMONARY / CRITICAL CARE MEDICINE   Name: Adam Benson MRN: [id number] DOB: [birth year]    ADMISSION DATE:  02/01/2015 CONSULTATION DATE:  02/01/2015  REFERRING MD :  Jeanene Erb that heRandolph  CHIEF COMPLAINT:  Severe CAP  INITIAL PRESENTATION: Presented to [facility] , being managed for pna, ards  STUDIES:  Portable CXR - bilateral opacities. ETT about 6 cm above carina.  SIGNIFICANT EVENTS: 9/4 - intubated 9/4 - transferred from OSH 9/5- some brady from Cardizem, amio 9/6- lasix started 9/7- fib rvr, neg 4.6 liters  SUBJECTIVE:   VITAL SIGNS: Temp:  [97.3 F (36.3 C)-102.6 F (39.2 C)] 97.3 F (36.3 C) (09/08 0752) Pulse Rate:  [70-153] 87 (09/08 0837) Resp:  [0-30] 22 (09/08 0837) BP: (89-134)/(54-79) 89/54 mmHg (09/08 0837) SpO2:  [92 %-100 %] 99 % (09/08 0837) FiO2 (%):  [50 %] 50 % (09/08 0837) Weight:  [131.09 kg (289 lb)] 131.09 kg (289 lb) (09/08 0500) HEMODYNAMICS: CVP:  [9 mmHg] 9 mmHg VENTILATOR SETTINGS: Vent Mode:  [-] PRVC FiO2 (%):  [50 %] 50 % Set Rate:  [22 bmp-30 bmp] 22 bmp Vt Set:  [470 mL] 470 mL PEEP:  [10 cmH20-12 cmH20] 10 cmH20 Plateau Pressure:  [29 cmH20-34 cmH20] 30 cmH20 INTAKE / OUTPUT:  Intake/Output Summary (Last 24 hours) at 02/05/15 0849 Last data filed at 02/05/15 0800  Gross per 24 hour  Intake 5534.98 ml  Output   7100 ml  Net -1565.02 ml    PHYSICAL EXAMINATION: General: sedated deep, paralyzed Neuro:paralyzed, sedated rass -5, perr HEENT: line clean left, obese, unable to tell jvd, old trach scar PULM: less coarse, distant CV: s1 s2 irr, controlled arte GI: soft, obese, nt, nd, no r Extremities: edema mild 1 , no rash   LABS:  CBC  Recent Labs Lab 02/03/15 0538 02/04/15 0402 02/05/15 0644  WBC 11.4* 9.3 9.0  HGB 9.1* 9.7* 10.8*  HCT 31.8* 34.0* 38.2*  PLT 255 272 267   Coag's  Recent Labs Lab 02/01/15 0507  INR 1.66*   BMET  Recent Labs Lab 02/04/15 0945 02/04/15 2019 02/05/15 0644   NA 144 148* 148*  K 4.4 4.0 3.3*  CL 97* 98* 97*  CO2 40* 42* 43*  BUN 44* 47* 46*  CREATININE 1.74* 1.68* 1.29*  GLUCOSE 141* 154* 132*   Electrolytes  Recent Labs Lab 02/03/15 0538 02/03/15 1014 02/03/15 2047  02/04/15 0945 02/04/15 2019 02/05/15 0644  CALCIUM 8.4*  --  8.2*  < > 8.2* 8.7* 8.8*  MG 2.4 2.5* 2.1  --   --   --   --   PHOS 2.8 2.5 3.8  --   --   --   --   < > = values in this interval not displayed. Sepsis Markers  Recent Labs Lab 02/01/15 0507 02/01/15 0755 02/01/15 2002  LATICACIDVEN  --  1.1 1.0  PROCALCITON 1.29  --   --    ABG  Recent Labs Lab 02/04/15 0431 02/04/15 0821 02/05/15 0355  PHART 7.303* 7.315* 7.461*  PCO2ART 82.6* 82.9* 60.2*  PO2ART 74.0* 62.0* 63.0*   Liver Enzymes  Recent Labs Lab 02/01/15 0507 02/04/15 0402 02/05/15 0644  AST ALT 11* 11* 16*  ALKPHOS 32* 32* 34*  BILITOT 0.6 0.4 0.7  ALBUMIN 3.0* 2.5* 2.5*   Cardiac Enzymes  Recent Labs Lab 02/01/15 0754 02/01/15 2001  TROPONINI <0.03 <0.03   Glucose  Recent Labs Lab 02/04/15 1105 02/04/15 1532 02/04/15 1920 02/04/15 2358 02/05/15  0350 02/05/15 0743  GLUCAP 149* 152* 148* 133* 175* 122*    Imaging Dg Chest Port 1 View  02/05/2015   CLINICAL DATA:  ARDS.  EXAM: PORTABLE CHEST - 1 VIEW  COMPARISON:  02/04/2015.  02/03/2015.  FINDINGS: Endotracheal tube, left IJ line, NG tube in stable position. Stable cardiomegaly. Persistent but improving bilateral diffuse airspace disease. No prominent pleural effusion. No pneumothorax.  IMPRESSION: 1. Lines and tubes in stable position. 2. Persistent but improving diffuse bilateral airspace disease. 3. Stable cardiomegaly.   Electronically Signed   By: Maisie Fus  Register   On: 02/05/2015 07:24   ASSESSMENT / PLAN:  PULMONARY OETT 9/4>> A: Acute on chronic hypoxic respiratory failure Severe community acquired pneumonia Severe ARDS (50% peep 10, rate 35, T V580 ) P:   -maintain neg balance as  able -abg reviewed, MV needs improved, rate reduction -peep 10, goal to 8 if to 40% -pcxr in am   CARDIOVASCULAR R IJ CVL 9/4>>> A:  Septic shock Elevated BNP History of atrial fibrillation 9/7 RVR Suspected diastolic congestive heart failure P:  -Control fevers -soft MAP this am , soft low dose oral load Cardizem  - continued amio  RENAL A:  Chronic kidney disease ARDS continued to improve crt with lasix hypoK  P:   -kvo -keep same dose lasix  -bmet q12h  -k supp  GASTROINTESTINAL A:   History of peptic ulcer disease December 2015 BM noted, massive yeah!! 9/7  P:    - Protonix via tube daily - TF per nutrition, consider oxepa, will d/w nutrition  HEMATOLOGIC A:   Leukocytosis hemoconcetration P:  -cbc in am  - SCDs - Heparin subcutaneous  INFECTIOUS A:   Severe CAP Likley contamination BC Spiking fever, missing organism pcn allergy P:   Urine Leg Ag 9/4>>pending Urine Strep Ag 9/4>>negative Respiratory Viral PCR 9/4>>neg  BCx2 9/4>>1/2 GPC UC 9/4>> Sputum 9/4>>  Abx: Vancomycin 9/4>>> Aztreonam 9/4>>>9/7 Levaquin 9/4>>> Gent 9/7>>>  Rocephin 9/3>>9/3 (given at OSH) Azithromycin 9/3>>9/3 (given at OSH)  Keep gent, fever better UA  ENDOCRINE A:   Hyperglycemia    P:   ssi Ensure tsh done - wnl  NEUROLOGIC A:   ARDS Severe dyschrony  P:   - RASS goal: -3 - Versed and fentanyl drip. - Nimbex attempt dc - NO WUA  FAMILY  - Updates:  Dad updated by me daily  - Inter-disciplinary family meet or Palliative Care meeting due by:  9/11  Ccm time 35 min   [name]. Tyson Alias, MD, FACP Pgr: [phone] [place] [facility]

## 2015-02-05 NOTE — Progress Notes (Signed)
PULMONARY / CRITICAL CARE MEDICINE   Name: Adam Benson MRN: [id number] DOB: [birth year]    ADMISSION DATE:  02/01/2015 CONSULTATION DATE:  02/01/2015  REFERRING MD :  Jeanene Erb that heRandolph  CHIEF COMPLAINT:  Severe CAP  INITIAL PRESENTATION: Presented to [facility] , being managed for pna, ards  STUDIES:  Portable CXR - bilateral opacities. ETT about 6 cm above carina.  SIGNIFICANT EVENTS: 9/4 - intubated 9/4 - transferred from OSH 9/5- some brady from Cardizem, amio 9/6- lasix started 9/7- fib rvr, neg 4.6 liters  SUBJECTIVE: Heart rate controlled, neg 1.7 liters  VITAL SIGNS: Temp:  [97.3 F (36.3 C)-102.6 F (39.2 C)] 97.3 F (36.3 C) (09/08 0752) Pulse Rate:  [70-153] 73 (09/08 0800) Resp:  [0-30] 30 (09/08 0800) BP: (89-134)/(54-79) 89/54 mmHg (09/08 0800) SpO2:  [92 %-100 %] 98 % (09/08 0800) FiO2 (%):  [50 %] 50 % (09/08 0346) Weight:  [131.09 kg (289 lb)] 131.09 kg (289 lb) (09/08 0500) HEMODYNAMICS: CVP:  [9 mmHg] 9 mmHg VENTILATOR SETTINGS: Vent Mode:  [-] PRVC FiO2 (%):  [50 %] 50 % Set Rate:  [30 bmp] 30 bmp Vt Set:  [470 mL] 470 mL PEEP:  [12 cmH20] 12 cmH20 Plateau Pressure:  [29 cmH20-34 cmH20] 34 cmH20 INTAKE / OUTPUT:  Intake/Output Summary (Last 24 hours) at 02/05/15 0830 Last data filed at 02/05/15 0800  Gross per 24 hour  Intake 5684.98 ml  Output   7100 ml  Net -1415.02 ml    PHYSICAL EXAMINATION: General: sedated deep, paralyzed Neuro:paralyzed, sedated rass -5, perr HEENT: line clean left, obese, unable to tell jvd, old trach scar PULM: less coarse, distant CV: s1 s2 irr, controlled arte GI: soft, obese, nt, nd, no r Extremities: edema mild 1 , no rash   LABS:  CBC  Recent Labs Lab 02/03/15 0538 02/04/15 0402 02/05/15 0644  WBC 11.4* 9.3 9.0  HGB 9.1* 9.7* 10.8*  HCT 31.8* 34.0* 38.2*  PLT 255 272 267   Coag's  Recent Labs Lab 02/01/15 0507  INR 1.66*   BMET  Recent Labs Lab 02/04/15 0945  02/04/15 2019 02/05/15 0644  NA 144 148* 148*  K 4.4 4.0 3.3*  CL 97* 98* 97*  CO2 40* 42* 43*  BUN 44* 47* 46*  CREATININE 1.74* 1.68* 1.29*  GLUCOSE 141* 154* 132*   Electrolytes  Recent Labs Lab 02/03/15 0538 02/03/15 1014 02/03/15 2047  02/04/15 0945 02/04/15 2019 02/05/15 0644  CALCIUM 8.4*  --  8.2*  < > 8.2* 8.7* 8.8*  MG 2.4 2.5* 2.1  --   --   --   --   PHOS 2.8 2.5 3.8  --   --   --   --   < > = values in this interval not displayed. Sepsis Markers  Recent Labs Lab 02/01/15 0507 02/01/15 0755 02/01/15 2002  LATICACIDVEN  --  1.1 1.0  PROCALCITON 1.29  --   --    ABG  Recent Labs Lab 02/04/15 0431 02/04/15 0821 02/05/15 0355  PHART 7.303* 7.315* 7.461*  PCO2ART 82.6* 82.9* 60.2*  PO2ART 74.0* 62.0* 63.0*   Liver Enzymes  Recent Labs Lab 02/01/15 0507 02/04/15 0402 02/05/15 0644  AST ALT 11* 11* 16*  ALKPHOS 32* 32* 34*  BILITOT 0.6 0.4 0.7  ALBUMIN 3.0* 2.5* 2.5*   Cardiac Enzymes  Recent Labs Lab 02/01/15 0754 02/01/15 2001  TROPONINI <0.03 <0.03   Glucose  Recent Labs Lab 02/04/15 1105 02/04/15 1532 02/04/15 1920  02/04/15 2358 02/05/15 0350 02/05/15 0743  GLUCAP 149* 152* 148* 133* 175* 122*    Imaging Dg Chest Port 1 View  02/05/2015   CLINICAL DATA:  ARDS.  EXAM: PORTABLE CHEST - 1 VIEW  COMPARISON:  02/04/2015.  02/03/2015.  FINDINGS: Endotracheal tube, left IJ line, NG tube in stable position. Stable cardiomegaly. Persistent but improving bilateral diffuse airspace disease. No prominent pleural effusion. No pneumothorax.  IMPRESSION: 1. Lines and tubes in stable position. 2. Persistent but improving diffuse bilateral airspace disease. 3. Stable cardiomegaly.   Electronically Signed   By: Maisie Fus  Register   On: 02/05/2015 07:24   ASSESSMENT / PLAN:  PULMONARY OETT 9/4>> A: Acute on chronic hypoxic respiratory failure Severe community acquired pneumonia Severe ARDS (50% peep 10, rate 35, T V580 ) P:    -maintain neg balance as able -abg reviewed, MV needs improved, rate reduction -peep 10, goal to 8 if to 40% -pcxr in am   CARDIOVASCULAR R IJ CVL 9/4>>> A:  Septic shock Elevated BNP History of atrial fibrillation 9/7 RVR Suspected diastolic congestive heart failure P:  -Control fevers -soft MAP this am , soft low dose oral load Cardizem  - continued amio  RENAL A:  Chronic kidney disease ARDS continued to improve crt with lasix hypoK  P:   -kvo -keep same dose lasix  -bmet q12h  -k supp  GASTROINTESTINAL A:   History of peptic ulcer disease December 2015 BM noted, massive yeah!! 9/7  P:    - Protonix via tube daily - TF per nutrition, consider oxepa, will d/w nutrition  HEMATOLOGIC A:   Leukocytosis hemoconcetration P:  -cbc in am  - SCDs - Heparin subcutaneous  INFECTIOUS A:   Severe CAP Likley contamination BC Spiking fever, missing organism pcn allergy P:   Urine Leg Ag 9/4>>pending Urine Strep Ag 9/4>>negative Respiratory Viral PCR 9/4>>neg  BCx2 9/4>>1/2 GPC UC 9/4>> Sputum 9/4>>  Abx: Vancomycin 9/4>>> Aztreonam 9/4>>>9/7 Levaquin 9/4>>> Gent 9/7>>>  Rocephin 9/3>>9/3 (given at OSH) Azithromycin 9/3>>9/3 (given at OSH)  Keep gent, fever better UA  ENDOCRINE A:   Hyperglycemia    P:   ssi Ensure tsh done - wnl  NEUROLOGIC A:   ARDS Severe dyschrony  P:   - RASS goal: -3 - Versed and fentanyl drip. - Nimbex attempt dc - NO WUA  FAMILY  - Updates:  Dad updated by me daily  - Inter-disciplinary family meet or Palliative Care meeting due by:  9/11  Ccm time 35 min   [name]. Tyson Alias, MD, FACP Pgr: [phone]  Pulmonary & Critical Care

## 2015-02-05 NOTE — Progress Notes (Signed)
ANTIBIOTIC CONSULT NOTE - FOLLOW UP  Pharmacy Consult for Vancomycin and Aztreonam Indication: rule out sepsis  Allergies  Allergen Reactions  . Penicillins Hives and Shortness Of Breath  . Hydrochlorothiazide Other (See Comments)    hypercalemia    Patient Measurements: Height: 6' (182.9 cm) Weight: 289 lb (131.09 kg) IBW/kg (Calculated) : 77.6 Adjusted Body Weight: 115 kg  Vital Signs: Temp: 97.3 F (36.3 C) (09/08 0752) Temp Source: Oral (09/08 0752) BP: 89/54 mmHg (09/08 0837) Pulse Rate: 87 (09/08 0837) Intake/Output from previous day: 09/07 0701 - 09/08 0700 In: 5549.4 [I.V.:2703.1; NG/GT:2075; IV Piggyback:771.3] Out: 7625 [Urine:7625] Intake/Output from this shift: Total I/O In: 313.2 [I.V.:163.2; NG/GT:150] Out: 675 [Urine:675]  Labs (at [facility]): WBC  20.8 Hgb  11.9 Hct  39.2 Plt  298  SCr  1.6  INR 1.4 APTT  31.4   Recent Labs  02/03/15 0538  02/04/15 0402 02/04/15 0945 02/04/15 2019 02/05/15 0644  WBC 11.4*  --  9.3  --   --  9.0  HGB 9.1*  --  9.7*  --   --  10.8*  PLT 255  --  272  --   --  267  CREATININE 2.84*  < > 1.90* 1.74* 1.68* 1.29*  < > = values in this interval not displayed. Estimated Creatinine Clearance: 94.9 mL/min (by C-G formula based on Cr of 1.29).  Recent Labs  02/04/15 2019  GENTRANDOM 11.7     Microbiology: Recent Results (from the past 720 hour(s))  MRSA PCR Screening     Status: None   Collection Time: 02/01/15  2:05 AM  Result Value Ref Range Status   MRSA by PCR NEGATIVE NEGATIVE Final    Comment:        The GeneXpert MRSA Assay (FDA approved for NASAL specimens only), is one component of a comprehensive MRSA colonization surveillance program. It is not intended to diagnose MRSA infection nor to guide or monitor treatment for MRSA infections.   Culture, respiratory (NON-Expectorated)     Status: None   Collection Time: 02/01/15  4:07 AM  Result Value Ref Range Status   Specimen  Description TRACHEAL ASPIRATE  Final   Special Requests Normal  Final   Gram Stain   Final    NO WBC SEEN NO SQUAMOUS EPITHELIAL CELLS SEEN NO ORGANISMS SEEN Performed at [facility]    Culture   Final    Non-Pathogenic Oropharyngeal-type Flora Isolated. Performed at [facility]    Report Status 02/03/2015 FINAL  Final  Respiratory virus panel     Status: None   Collection Time: 02/01/15  4:09 AM  Result Value Ref Range Status   Source - RVPAN NASAL SWAB  Corrected   Respiratory Syncytial Virus A Negative Negative Final   Respiratory Syncytial Virus B Negative Negative Final   Influenza A Negative Negative Final   Influenza B Negative Negative Final   Parainfluenza 1 Negative Negative Final   Parainfluenza 2 Negative Negative Final   Parainfluenza 3 Negative Negative Final   Metapneumovirus Negative Negative Final   Rhinovirus Negative Negative Final   Adenovirus Negative Negative Final    Comment: (NOTE) Performed At: [facility] [address] [place], [place] [id number] Mila Homer MD [id number]   Culture, blood (routine x 2)     Status: None (Preliminary result)   Collection Time: 02/01/15  4:30 AM  Result Value Ref Range Status   Specimen Description BLOOD L IJ CVC  Final   Special  Requests BOTTLES DRAWN AEROBIC AND ANAEROBIC 10CC  Final   Culture  Setup Time   Final    GRAM POSITIVE COCCI IN CLUSTERS ANAEROBIC BOTTLE ONLY CRITICAL RESULT CALLED TO, READ BACK BY AND VERIFIED WITH: J SOJACK@0133  02/03/15 M KELLY    Culture   Final    STAPHYLOCOCCUS SPECIES (COAGULASE NEGATIVE) THE SIGNIFICANCE OF ISOLATING THIS ORGANISM FROM A SINGLE SET OF BLOOD CULTURES WHEN MULTIPLE SETS ARE DRAWN IS UNCERTAIN. PLEASE NOTIFY THE MICROBIOLOGY DEPARTMENT WITHIN ONE WEEK IF SPECIATION AND SENSITIVITIES ARE REQUIRED.    Report Status PENDING  Incomplete  Culture, blood (routine x 2)     Status: None (Preliminary result)   Collection Time: 02/01/15   4:49 AM  Result Value Ref Range Status   Specimen Description BLOOD LEFT HAND  Final   Special Requests BOTTLES DRAWN AEROBIC AND ANAEROBIC 5CC  Final   Culture NO GROWTH 3 DAYS  Final   Report Status PENDING  Incomplete  Culture, blood (routine x 2)     Status: None (Preliminary result)   Collection Time: 02/03/15 12:21 PM  Result Value Ref Range Status   Specimen Description BLOOD RIGHT HAND  Final   Special Requests BOTTLES DRAWN AEROBIC AND ANAEROBIC 10CC  Final   Culture NO GROWTH 1 DAY  Final   Report Status PENDING  Incomplete  Culture, blood (routine x 2)     Status: None (Preliminary result)   Collection Time: 02/03/15 12:30 PM  Result Value Ref Range Status   Specimen Description BLOOD LEFT HAND  Final   Special Requests BOTTLES DRAWN AEROBIC AND ANAEROBIC 10CC  Final   Culture NO GROWTH 1 DAY  Final   Report Status PENDING  Incomplete    Medical History: Past Medical History  Diagnosis Date  . Gout   . HTN (hypertension), benign   . Chronic atrial fibrillation   . Hyperlipemia   . Nerve damage     right arm  . Obesity   . Insomnia   . GERD (gastroesophageal reflux disease)   . History of pneumonia 05/2014     two month hosp stay, vent, trach   . 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 Peptic ulcer 04/2014    Medications:  Norvasc  Atenolol  Uloric  Tricor  Norco  Lyrica  Ambien  Colchine  Prilosec  Cardizem  Lasix  Lopresor  KCL    Assessment: 53 YOM who was admitted for respiratory failure and subsequently intubated. Currently on IV Vancomycin, Levaquin and IV Gentamicin for severe CAP. WBC down to 9 today. Afebrile today. Abx D#5. SCr has improved drastically to 1.29 with normalized CrCl of 68 mL/min   9/4 Vanc>> 9/4 Aztreonam>>9/7 9/4 Levaquin>> 9/7 Gent>>   9//4 urinary legionella>> neg  9/4 urinary strep>>neg 9/4 resp viral>>neg  9/4 blood>> 1/2 CNS  Goal of Therapy:  Vancomycin trough level 15-20  mcg/ml  Plan:  Continue Vancomycin to 1750 mg IV Q 24 hours  -gentamicin 200 mg Q 12 hours Levaquin 750 mg IV Q 48 hours  Monitor CBC, renal fx, cultures, and clinical progress 0530 VT on 9/8  F/u lytes on lasix   Vinnie Level, PharmD., BCPS Clinical Pharmacist Pager [phone]

## 2015-02-05 NOTE — Progress Notes (Signed)
eLink Physician-Brief Progress Note Patient Name: Adam Benson DOB: Oct 05, 1960 MRN: [id number]   Date of Service  02/05/2015  HPI/Events of Note  AFIB with ventricular rate = 120's to 130's. Patient currently on an Amiodarone IV infusion and Cardizem PO.   eICU Interventions  Will order: 1. BMP and Mg++ now. 2. Amiodarone 150 mg IV bolus now.      Intervention Category Major Interventions: Arrhythmia - evaluation and management  Lindzey Zent Dennard Nip 02/05/2015, 6:57 PM

## 2015-02-05 NOTE — Progress Notes (Signed)
ANTIBIOTIC CONSULT NOTE - Follow-up  Pharmacy Consult for Gentamicin Indication: Sepsis   Allergies  Allergen Reactions  . Penicillins Hives and Shortness Of Breath  . Hydrochlorothiazide Other (See Comments)    hypercalemia    Patient Measurements: Height: 6' (182.9 cm) Weight: (!) 409 lb (185.521 kg) IBW/kg (Calculated) : 77.6 Adjusted Body Weight: 120 kg   Vital Signs: Temp: 98.4 F (36.9 C) (09/08 0000) Temp Source: Oral (09/08 0000) BP: 103/63 mmHg (09/07 2345) Pulse Rate: 83 (09/07 2331) Intake/Output from previous day: 09/07 0701 - 09/08 0700 In: 3883.2 [I.V.:2061.9; NG/GT:1550; IV Piggyback:271.3] Out: 4325 [Urine:4325] Intake/Output from this shift: Total I/O In: 672.7 [I.V.:372.7; NG/GT:300] Out: 1025 [Urine:1025]  Labs:  Recent Labs  02/02/15 0351 02/03/15 0538  02/04/15 0402 02/04/15 0945 02/04/15 2019  WBC 19.7* 11.4*  --  9.3  --   --   HGB 9.6* 9.1*  --  9.7*  --   --   PLT 315 255  --  272  --   --   CREATININE 3.13* 2.84*  < > 1.90* 1.74* 1.68*  < > = values in this interval not displayed. Estimated Creatinine Clearance: 88.9 mL/min (by C-G formula based on Cr of 1.68).  Recent Labs  02/04/15 2019  GENTRANDOM 11.7     Microbiology: Recent Results (from the past 720 hour(s))  MRSA PCR Screening     Status: None   Collection Time: 02/01/15  2:05 AM  Result Value Ref Range Status   MRSA by PCR NEGATIVE NEGATIVE Final    Comment:        The GeneXpert MRSA Assay (FDA approved for NASAL specimens only), is one component of a comprehensive MRSA colonization surveillance program. It is not intended to diagnose MRSA infection nor to guide or monitor treatment for MRSA infections.   Culture, respiratory (NON-Expectorated)     Status: None   Collection Time: 02/01/15  4:07 AM  Result Value Ref Range Status   Specimen Description TRACHEAL ASPIRATE  Final   Special Requests Normal  Final   Gram Stain   Final    NO WBC SEEN NO SQUAMOUS  EPITHELIAL CELLS SEEN NO ORGANISMS SEEN Performed at [facility]    Culture   Final    Non-Pathogenic Oropharyngeal-type Flora Isolated. Performed at [facility]    Report Status 02/03/2015 FINAL  Final  Respiratory virus panel     Status: None   Collection Time: 02/01/15  4:09 AM  Result Value Ref Range Status   Source - RVPAN NASAL SWAB  Corrected   Respiratory Syncytial Virus A Negative Negative Final   Respiratory Syncytial Virus B Negative Negative Final   Influenza A Negative Negative Final   Influenza B Negative Negative Final   Parainfluenza 1 Negative Negative Final   Parainfluenza 2 Negative Negative Final   Parainfluenza 3 Negative Negative Final   Metapneumovirus Negative Negative Final   Rhinovirus Negative Negative Final   Adenovirus Negative Negative Final    Comment: (NOTE) Performed At: [facility] [address] [place], [place] [id number] Mila Homer MD VZ:[id number]   Culture, blood (routine x 2)     Status: None (Preliminary result)   Collection Time: 02/01/15  4:30 AM  Result Value Ref Range Status   Specimen Description BLOOD L IJ CVC  Final   Special Requests BOTTLES DRAWN AEROBIC AND ANAEROBIC 10CC  Final   Culture  Setup Time   Final    GRAM POSITIVE COCCI IN CLUSTERS ANAEROBIC BOTTLE  ONLY CRITICAL RESULT CALLED TO, READ BACK BY AND VERIFIED WITH: J SOJACK@0133  02/03/15 M KELLY    Culture   Final    STAPHYLOCOCCUS SPECIES (COAGULASE NEGATIVE) THE SIGNIFICANCE OF ISOLATING THIS ORGANISM FROM A SINGLE SET OF BLOOD CULTURES WHEN MULTIPLE SETS ARE DRAWN IS UNCERTAIN. PLEASE NOTIFY THE MICROBIOLOGY DEPARTMENT WITHIN ONE WEEK IF SPECIATION AND SENSITIVITIES ARE REQUIRED.    Report Status PENDING  Incomplete  Culture, blood (routine x 2)     Status: None (Preliminary result)   Collection Time: 02/01/15  4:49 AM  Result Value Ref Range Status   Specimen Description BLOOD LEFT HAND  Final   Special Requests BOTTLES DRAWN  AEROBIC AND ANAEROBIC 5CC  Final   Culture NO GROWTH 3 DAYS  Final   Report Status PENDING  Incomplete  Culture, blood (routine x 2)     Status: None (Preliminary result)   Collection Time: 02/03/15 12:21 PM  Result Value Ref Range Status   Specimen Description BLOOD RIGHT HAND  Final   Special Requests BOTTLES DRAWN AEROBIC AND ANAEROBIC 10CC  Final   Culture NO GROWTH 1 DAY  Final   Report Status PENDING  Incomplete  Culture, blood (routine x 2)     Status: None (Preliminary result)   Collection Time: 02/03/15 12:30 PM  Result Value Ref Range Status   Specimen Description BLOOD LEFT HAND  Final   Special Requests BOTTLES DRAWN AEROBIC AND ANAEROBIC 10CC  Final   Culture NO GROWTH 1 DAY  Final   Report Status PENDING  Incomplete    Assessment: 39 YOM with severe CAP on Day #5 of abx. Currently on Gentamicin, Vanc, and Levaquin.  8 hr random gentamicin level (past /kg adjusted body wt dose) = 11.9 mcg/ml. According to the [facility] Extended Interval dosing nomogram, pt needs to be switched to traditional dosing due to level falling above nomogram dosing.  9/4 Vanc>> 9/4 Aztreonam>>9/7 9/4 Levaquin>> 9/7 Gent>>   9//4 urinary legionella>> neg  9/4 urinary strep>>neg 9/4 resp viral>>neg  9/4 blood>> 1/2 CNS  Scr trending down to 1.68. Normalized CrCl ~50 ml/min.  Goal of Therapy:  Gentamicin peak 6-8 mcg/ml and trough < 19mcg/ml  Plan:  -Change gentamicin to  IV q12h. Next dose due 9/9 1200 (~48 hr past first dose) -Will check peak and trough at [facility] if continues  Christoper Fabian, PharmD, BCPS Clinical pharmacist, pager [phone] 02/05/2015 12:17 AM

## 2015-02-05 NOTE — Progress Notes (Signed)
eLink Physician-Brief Progress Note Patient Name: Adam Benson DOB: [birth year] MRN: [id number]   Date of Service  02/05/2015  HPI/Events of Note  A Fib w RVR, appears to be agitated as well.   eICU Interventions  Will attempt to increase sedation slightly, increase ceiling on diltiazem to 20. May need to restart norepi to tolerate.      Intervention Category Major Interventions: Arrhythmia - evaluation and management  Sherron Mummert S. 02/05/2015, 11:31 PM

## 2015-02-06 ENCOUNTER — Inpatient Hospital Stay (HOSPITAL_COMMUNITY): Payer: Medicaid Other

## 2015-02-06 LAB — BLOOD GAS, ARTERIAL
Acid-Base Excess: 19.5 mmol/L — ABNORMAL HIGH (ref 0.0–2.0)
BICARBONATE: 45.3 meq/L — AB (ref 20.0–24.0)
DRAWN BY: 364961
FIO2: 0.5
MECHVT: 470 mL
O2 SAT: 93.4 %
PEEP: 10 cmH2O
PH ART: 7.395 (ref 7.350–7.450)
Patient temperature: 102.3
RATE: 22 resp/min
TCO2: 47.5 mmol/L (ref 0–100)
pCO2 arterial: 77.6 mmHg (ref 35.0–45.0)
pO2, Arterial: 82.5 mmHg (ref 80.0–100.0)

## 2015-02-06 LAB — HEPARIN LEVEL (UNFRACTIONATED)

## 2015-02-06 LAB — CBC WITH DIFFERENTIAL/PLATELET
BASOS ABS: 0 10*3/uL (ref 0.0–0.1)
BASOS PCT: 0 % (ref 0–1)
EOS ABS: 0.5 10*3/uL (ref 0.0–0.7)
Eosinophils Relative: 4 % (ref 0–5)
HCT: 39.5 % (ref 39.0–52.0)
Hemoglobin: 11.5 g/dL — ABNORMAL LOW (ref 13.0–17.0)
LYMPHS ABS: 2.1 10*3/uL (ref 0.7–4.0)
Lymphocytes Relative: 17 % (ref 12–46)
MCH: 21.1 pg — AB (ref 26.0–34.0)
MCHC: 29.1 g/dL — AB (ref 30.0–36.0)
MCV: 72.6 fL — ABNORMAL LOW (ref 78.0–100.0)
MONO ABS: 1.1 10*3/uL — AB (ref 0.1–1.0)
Monocytes Relative: 9 % (ref 3–12)
NEUTROS ABS: 8.8 10*3/uL — AB (ref 1.7–7.7)
Neutrophils Relative %: 70 % (ref 43–77)
PLATELETS: 313 10*3/uL (ref 150–400)
RBC: 5.44 MIL/uL (ref 4.22–5.81)
RDW: 20.2 % — AB (ref 11.5–15.5)
WBC: 12.5 10*3/uL — ABNORMAL HIGH (ref 4.0–10.5)

## 2015-02-06 LAB — BASIC METABOLIC PANEL
Anion gap: 8 (ref 5–15)
BUN: 50 mg/dL — AB (ref 6–20)
CO2: 46 mmol/L — ABNORMAL HIGH (ref 22–32)
Calcium: 8.9 mg/dL (ref 8.9–10.3)
Chloride: 98 mmol/L — ABNORMAL LOW (ref 101–111)
Creatinine, Ser: 1.73 mg/dL — ABNORMAL HIGH (ref 0.61–1.24)
GFR, EST AFRICAN AMERICAN: 51 mL/min — AB (ref >60)
GFR, EST NON AFRICAN AMERICAN: 44 mL/min — AB (ref >60)
Glucose, Bld: 146 mg/dL — ABNORMAL HIGH (ref 65–99)
POTASSIUM: 4 mmol/L (ref 3.5–5.1)
SODIUM: 152 mmol/L — AB (ref 135–145)

## 2015-02-06 LAB — GLUCOSE, CAPILLARY
GLUCOSE-CAPILLARY: 125 mg/dL — AB (ref 65–99)
GLUCOSE-CAPILLARY: 127 mg/dL — AB (ref 65–99)
GLUCOSE-CAPILLARY: 140 mg/dL — AB (ref 65–99)
Glucose-Capillary: 127 mg/dL — ABNORMAL HIGH (ref 65–99)
Glucose-Capillary: 138 mg/dL — ABNORMAL HIGH (ref 65–99)
Glucose-Capillary: 141 mg/dL — ABNORMAL HIGH (ref 65–99)

## 2015-02-06 LAB — VANCOMYCIN, TROUGH: VANCOMYCIN TR: 11 ug/mL (ref 10.0–20.0)

## 2015-02-06 LAB — CULTURE, BLOOD (ROUTINE X 2): CULTURE: NO GROWTH

## 2015-02-06 LAB — APTT: APTT: 30 s (ref 24–37)

## 2015-02-06 MED ORDER — DILTIAZEM LOAD VIA INFUSION
20.0000 mg | Freq: Once | INTRAVENOUS | Status: AC
  Administered 2015-02-06: 20 mg via INTRAVENOUS
  Filled 2015-02-06: qty 20

## 2015-02-06 MED ORDER — AMIODARONE LOAD VIA INFUSION
150.0000 mg | Freq: Once | INTRAVENOUS | Status: AC
  Administered 2015-02-06: 150 mg via INTRAVENOUS
  Filled 2015-02-06: qty 83.34

## 2015-02-06 MED ORDER — VANCOMYCIN HCL 10 G IV SOLR
1750.0000 mg | INTRAVENOUS | Status: DC
  Administered 2015-02-06: 1750 mg via INTRAVENOUS
  Filled 2015-02-06: qty 1750

## 2015-02-06 MED ORDER — METOPROLOL TARTRATE 1 MG/ML IV SOLN
5.0000 mg | Freq: Once | INTRAVENOUS | Status: AC
  Administered 2015-02-06: 5 mg via INTRAVENOUS
  Filled 2015-02-06: qty 5

## 2015-02-06 MED ORDER — LINEZOLID 600 MG/300ML IV SOLN
600.0000 mg | Freq: Two times a day (BID) | INTRAVENOUS | Status: AC
  Administered 2015-02-06 – 2015-02-14 (×18): 600 mg via INTRAVENOUS
  Filled 2015-02-06 (×18): qty 300

## 2015-02-06 MED ORDER — ASPIRIN 325 MG PO TABS
325.0000 mg | ORAL_TABLET | Freq: Every day | ORAL | Status: DC
  Administered 2015-02-06 – 2015-02-20 (×15): 325 mg via ORAL
  Filled 2015-02-06 (×16): qty 1

## 2015-02-06 MED ORDER — METOPROLOL TARTRATE 1 MG/ML IV SOLN
5.0000 mg | Freq: Once | INTRAVENOUS | Status: AC
  Administered 2015-02-06: 5 mg via INTRAVENOUS

## 2015-02-06 MED ORDER — ACETAMINOPHEN 160 MG/5ML PO SOLN
650.0000 mg | Freq: Once | ORAL | Status: AC
  Administered 2015-02-06: 650 mg
  Filled 2015-02-06: qty 20.3

## 2015-02-06 MED ORDER — RISPERIDONE 1 MG PO TABS
1.0000 mg | ORAL_TABLET | Freq: Two times a day (BID) | ORAL | Status: DC
  Administered 2015-02-06 – 2015-02-12 (×13): 1 mg via ORAL
  Filled 2015-02-06 (×15): qty 1

## 2015-02-06 MED ORDER — IMIPENEM-CILASTATIN 500 MG IV SOLR
500.0000 mg | Freq: Three times a day (TID) | INTRAVENOUS | Status: DC
  Administered 2015-02-06 – 2015-02-09 (×10): 500 mg via INTRAVENOUS
  Filled 2015-02-06 (×14): qty 500

## 2015-02-06 MED ORDER — VANCOMYCIN HCL 10 G IV SOLR: 1750.0000 mg | INTRAVENOUS | Status: DC

## 2015-02-06 MED ORDER — METOPROLOL TARTRATE 1 MG/ML IV SOLN
INTRAVENOUS | Status: AC
  Filled 2015-02-06: qty 5

## 2015-02-06 MED ORDER — ETOMIDATE 2 MG/ML IV SOLN
20.0000 mg | Freq: Once | INTRAVENOUS | Status: AC
  Administered 2015-02-06: 20 mg via INTRAVENOUS

## 2015-02-06 MED ORDER — FREE WATER
300.0000 mL | Freq: Four times a day (QID) | Status: DC
  Administered 2015-02-06 – 2015-02-07 (×5): 300 mL

## 2015-02-06 MED ORDER — ACETAMINOPHEN 160 MG/5ML PO SOLN
650.0000 mg | ORAL | Status: DC | PRN
  Administered 2015-02-06 – 2015-03-02 (×19): 650 mg
  Filled 2015-02-06 (×19): qty 20.3

## 2015-02-06 MED ORDER — ETOMIDATE 2 MG/ML IV SOLN
INTRAVENOUS | Status: AC
  Filled 2015-02-06: qty 10

## 2015-02-06 NOTE — Progress Notes (Signed)
RN made aware of critical PCO2 result from [facility].

## 2015-02-06 NOTE — Progress Notes (Addendum)
ANTIBIOTIC CONSULT NOTE - INITIAL  Pharmacy Consult: Primaxin / Zyvox + Heparin Indication:  PNA and fever + history of AFib  Allergies  Allergen Reactions  . Penicillins Hives and Shortness Of Breath  . Hydrochlorothiazide Other (See Comments)    hypercalemia    Patient Measurements: Height: 6' (182.9 cm) Weight: (!) 374 lb (169.645 kg) IBW/kg (Calculated) : 77.6 Adjusted Body Weight: 119 kg  Vital Signs: Temp: 101.4 F (38.6 C) (09/09 0834) Temp Source: Oral (09/09 0834) BP: 130/58 mmHg (09/09 1134) Pulse Rate: 140 (09/09 1134) Intake/Output from previous day: 09/08 0701 - 09/09 0700 In: 4526.5 [I.V.:1586.5; NG/GT:2290; IV Piggyback:650] Out: 9604 [Urine:7965] Intake/Output from this shift: Total I/O In: 588 [I.V.:438; NG/GT:150] Out: 1475 [Urine:1475]  Labs:  Recent Labs  02/04/15 0402  02/05/15 0644 02/05/15 1905 02/06/15 0520 02/06/15 0845  WBC 9.3  --  9.0  --  12.5*  --   HGB 9.7*  --  10.8*  --  11.5*  --   PLT 272  --  267  --  313  --   CREATININE 1.90*  < > 1.29* 1.50*  --  1.73*  < > = values in this interval not displayed. Estimated Creatinine Clearance: 81.7 mL/min (by C-G formula based on Cr of 1.73).  Recent Labs  02/04/15 2019 02/06/15 0520  VANCOTROUGH  --  11  GENTRANDOM 11.7  --      Microbiology: Recent Results (from the past 720 hour(s))  MRSA PCR Screening     Status: None   Collection Time: 02/01/15  2:05 AM  Result Value Ref Range Status   MRSA by PCR NEGATIVE NEGATIVE Final    Comment:        The GeneXpert MRSA Assay (FDA approved for NASAL specimens only), is one component of a comprehensive MRSA colonization surveillance program. It is not intended to diagnose MRSA infection nor to guide or monitor treatment for MRSA infections.   Culture, respiratory (NON-Expectorated)     Status: None   Collection Time: 02/01/15  4:07 AM  Result Value Ref Range Status   Specimen Description TRACHEAL ASPIRATE  Final   Special  Requests Normal  Final   Gram Stain   Final    NO WBC SEEN NO SQUAMOUS EPITHELIAL CELLS SEEN NO ORGANISMS SEEN Performed at [facility]    Culture   Final    Non-Pathogenic Oropharyngeal-type Flora Isolated. Performed at [facility]    Report Status 02/03/2015 FINAL  Final  Respiratory virus panel     Status: None   Collection Time: 02/01/15  4:09 AM  Result Value Ref Range Status   Source - RVPAN NASAL SWAB  Corrected   Respiratory Syncytial Virus A Negative Negative Final   Respiratory Syncytial Virus B Negative Negative Final   Influenza A Negative Negative Final   Influenza B Negative Negative Final   Parainfluenza 1 Negative Negative Final   Parainfluenza 2 Negative Negative Final   Parainfluenza 3 Negative Negative Final   Metapneumovirus Negative Negative Final   Rhinovirus Negative Negative Final   Adenovirus Negative Negative Final    Comment: (NOTE) Performed At: [facility] [address] [place], [place] [id number] Mila Homer MD YN:[id number]   Culture, blood (routine x 2)     Status: None   Collection Time: 02/01/15  4:30 AM  Result Value Ref Range Status   Specimen Description BLOOD L IJ CVC  Final   Special Requests BOTTLES DRAWN AEROBIC AND ANAEROBIC 10CC  Final  Culture  Setup Time   Final    GRAM POSITIVE COCCI IN CLUSTERS ANAEROBIC BOTTLE ONLY CRITICAL RESULT CALLED TO, READ BACK BY AND VERIFIED WITH: J SOJACK@0133  02/03/15 M KELLY    Culture   Final    STAPHYLOCOCCUS SPECIES (COAGULASE NEGATIVE) THE SIGNIFICANCE OF ISOLATING THIS ORGANISM FROM A SINGLE SET OF BLOOD CULTURES WHEN MULTIPLE SETS ARE DRAWN IS UNCERTAIN. PLEASE NOTIFY THE MICROBIOLOGY DEPARTMENT WITHIN ONE WEEK IF SPECIATION AND SENSITIVITIES ARE REQUIRED.    Report Status 02/05/2015 FINAL  Final  Culture, blood (routine x 2)     Status: None (Preliminary result)   Collection Time: 02/01/15  4:49 AM  Result Value Ref Range Status   Specimen Description  BLOOD LEFT HAND  Final   Special Requests BOTTLES DRAWN AEROBIC AND ANAEROBIC 5CC  Final   Culture NO GROWTH 4 DAYS  Final   Report Status PENDING  Incomplete  Culture, blood (routine x 2)     Status: None (Preliminary result)   Collection Time: 02/03/15 12:21 PM  Result Value Ref Range Status   Specimen Description BLOOD RIGHT HAND  Final   Special Requests BOTTLES DRAWN AEROBIC AND ANAEROBIC 10CC  Final   Culture NO GROWTH 2 DAYS  Final   Report Status PENDING  Incomplete  Culture, blood (routine x 2)     Status: None (Preliminary result)   Collection Time: 02/03/15 12:30 PM  Result Value Ref Range Status   Specimen Description BLOOD LEFT HAND  Final   Special Requests BOTTLES DRAWN AEROBIC AND ANAEROBIC 10CC  Final   Culture NO GROWTH 2 DAYS  Final   Report Status PENDING  Incomplete    Medical History: Past Medical History  Diagnosis Date  . Gout   . HTN (hypertension), benign   . Chronic atrial fibrillation   . Hyperlipemia   . Nerve damage     right arm  . Obesity   . Insomnia   . GERD (gastroesophageal reflux disease)   . History of pneumonia 05/2014     two month hosp stay, vent, trach   . 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 Peptic ulcer 04/2014     Assessment: 53 YOM with severe CAP, worsening renal function and returning fever on vancomycin, gentamicin and levofloxacin.  Now to switch to Zyvox and Primaxin while continuing levofloxacin.  Patient has PCN allergy but had an outpatient script for cefdinir filled in January.  Vanc 9/4 >> 9/9 Azactam 9/4 >> 9/7 LVQ 9/4 >> (9/11) Gent 9/7 >> 9/9 Azith/[facility] at [facility] Zyvox 9/9 >> Primaxin 9/9 >>  9/9 VT = 11 mcg/mL on  q24 >>  q18 (SCr 1.5) 9/7 8-hr gent random post 7 mg/kg dose = 11.9 mcg/mL (level above nomogram) >>  q12  9/4 RVP - negative 9/4 TA - negative 9/4 BCx x2 - CoNS (1 of 2, contaminant) 9/6 BCx x2 - NGTD   Goal of Therapy:   Resolution of infection   Plan:  - Primaxin  IV Q8H - Zyvox  IV Q12H.  Pharmacy will sign off as dosage adjustment is unnecessary.  - LVQ  IV Q24H per MD - Monitor renal fxn, resolution of fever - CHADsVASc = 1, continue to hold Xarelto (unsure whether patient was taking PTA), start ASA and continue heparin SQ per discussion with CCM    Merlean Pizzini D. Laney Potash, PharmD, BCPS Pager:  [phone] 02/06/2015, 2:30 PM

## 2015-02-06 NOTE — Progress Notes (Signed)
eLink Physician-Brief Progress Note Patient Name: Adam Benson DOB: 1961/05/14 MRN: [id number]   Date of Service  02/06/2015  HPI/Events of Note  Persistent RVR with better sedation and increase in dilt gtt. Amio also running.   eICU Interventions  Attempt single dose metoprolol, follow HR and BP     Intervention Category Major Interventions: Arrhythmia - evaluation and management  Daniesha Driver S. 02/06/2015, 1:35 AM

## 2015-02-06 NOTE — Procedures (Signed)
Central Venous Catheter Insertion Procedure Note Adam Benson [id number] 1961/04/21  Procedure: Insertion of Central Venous Catheter Indications: Assessment of intravascular volume, Drug and/or fluid administration and Frequent blood sampling  Procedure Details Consent: Risks of procedure as well as the alternatives and risks of each were explained to the (patient/caregiver).  Consent for procedure obtained. Time Out: Verified patient identification, verified procedure, site/side was marked, verified correct patient position, special equipment/implants available, medications/allergies/relevent history reviewed, required imaging and test results available.  Performed  Maximum sterile technique was used including antiseptics, cap, gloves, gown, hand hygiene, mask and sheet. Skin prep: Chlorhexidine; local anesthetic administered A antimicrobial bonded/coated triple lumen catheter was placed in the right internal jugular vein using the Seldinger technique.  Evaluation Blood flow good Complications: No apparent complications Patient did tolerate procedure well. Chest X-ray ordered to verify placement.  CXR: pending.  Adam Benson 02/06/2015, 1:33 PM  [place]  Need to dc left ij with fevers  Adam Benson. Tyson Alias, MD, FACP Pgr: [phone] [facility]

## 2015-02-06 NOTE — Progress Notes (Signed)
Nutrition Follow-up  DOCUMENTATION CODES:   Morbid obesity  INTERVENTION:    Continue TF via OGT with Vital High Protein at goal rate of 75 ml/h (1800 ml per day) with Prostat 30 ml TID to provide 2100 kcals, 203 gm protein, 1505 ml free water daily.  NUTRITION DIAGNOSIS:   Inadequate oral intake related to inability to eat as evidenced by NPO status.  Ongoing  GOAL:   Provide needs based on ASPEN/SCCM guidelines  Met  MONITOR:   TF tolerance, Vent status, Labs, Weight trends  ASSESSMENT:   Presented to [facility] after a few days of increasing dyspnea, weight gain, & lower extremity edema. Patient initially tried on BiPAP. Required intubation and transferred to [facility] on 9/4.  Discussed patient in ICU rounds and with RN today. Labs reviewed, sodium, BUN, creatinine elevated.   Patient is currently intubated on ventilator support Temp (24hrs), Avg:100.3 F (37.9 C), Min:99.3 F (37.4 C), Max:103.1 F (39.5 C)   Patient is currently receiving Vital High Protein via OGT at 75 ml/h (1800 ml per day) with Prostat 30 ml TID to provide 2100 kcals, 203 gm protein, 1505 ml free water daily.    Diet Order:  Diet NPO time specified  Skin:  Reviewed, no issues  Last BM:  unknown  Height:   Ht Readings from Last 1 Encounters:  11/18/14 6' (1.829 m)    Weight:   Wt Readings from Last 1 Encounters:  02/06/15 374 lb (169.645 kg)    Ideal Body Weight:  80.9 kg  BMI:  Body mass index is 50.71 kg/(m^2).  Estimated Nutritional Needs:   Kcal:  4949-4473  Protein:  202 gm  Fluid:  2.5 L  EDUCATION NEEDS:   No education needs identified at this time  Molli Barrows, [place], [place], [place] Pager [phone] After Hours Pager [phone]

## 2015-02-06 NOTE — Progress Notes (Signed)
ANTIBIOTIC CONSULT NOTE - FOLLOW UP  Pharmacy Consult for Vancomycin Indication: rule out sepsis  Allergies  Allergen Reactions  . Penicillins Hives and Shortness Of Breath  . Hydrochlorothiazide Other (See Comments)    hypercalemia    Patient Measurements: Height: 6' (182.9 cm) Weight: (!) 374 lb (169.645 kg) IBW/kg (Calculated) : 77.6 Adjusted Body Weight: 115 kg  Vital Signs: Temp: 99.6 F (37.6 C) (09/09 0006) Temp Source: Oral (09/09 0006) BP: 91/63 mmHg (09/09 0600) Pulse Rate: 117 (09/09 0600) Intake/Output from previous day: 09/08 0701 - 09/09 0700 In: 3867.8 [I.V.:1502.8; NG/GT:2215; IV Piggyback:150] Out: 7065 [Urine:7065] Intake/Output from this shift: Total I/O In: 1995 [I.V.:755; NG/GT:1090; IV Piggyback:150] Out: 3290 [Urine:3290]  Labs (at [facility]): WBC  20.8 Hgb  11.9 Hct  39.2 Plt  298  SCr  1.6  INR 1.4 APTT  31.4   Recent Labs  02/04/15 0402  02/04/15 2019 02/05/15 0644 02/05/15 1905 02/06/15 0520  WBC 9.3  --   --  9.0  --  12.5*  HGB 9.7*  --   --  10.8*  --  11.5*  PLT 272  --   --  267  --  313  CREATININE 1.90*  < > 1.68* 1.29* 1.50*  --   < > = values in this interval not displayed. Estimated Creatinine Clearance: 94.3 mL/min (by C-G formula based on Cr of 1.5).  Recent Labs  02/04/15 2019 02/06/15 0520  VANCOTROUGH  --  11  GENTRANDOM 11.7  --      Microbiology: Recent Results (from the past 720 hour(s))  MRSA PCR Screening     Status: None   Collection Time: 02/01/15  2:05 AM  Result Value Ref Range Status   MRSA by PCR NEGATIVE NEGATIVE Final    Comment:        The GeneXpert MRSA Assay (FDA approved for NASAL specimens only), is one component of a comprehensive MRSA colonization surveillance program. It is not intended to diagnose MRSA infection nor to guide or monitor treatment for MRSA infections.   Culture, respiratory (NON-Expectorated)     Status: None   Collection Time: 02/01/15  4:07 AM   Result Value Ref Range Status   Specimen Description TRACHEAL ASPIRATE  Final   Special Requests Normal  Final   Gram Stain   Final    NO WBC SEEN NO SQUAMOUS EPITHELIAL CELLS SEEN NO ORGANISMS SEEN Performed at [facility]    Culture   Final    Non-Pathogenic Oropharyngeal-type Flora Isolated. Performed at [facility]    Report Status 02/03/2015 FINAL  Final  Respiratory virus panel     Status: None   Collection Time: 02/01/15  4:09 AM  Result Value Ref Range Status   Source - RVPAN NASAL SWAB  Corrected   Respiratory Syncytial Virus A Negative Negative Final   Respiratory Syncytial Virus B Negative Negative Final   Influenza A Negative Negative Final   Influenza B Negative Negative Final   Parainfluenza 1 Negative Negative Final   Parainfluenza 2 Negative Negative Final   Parainfluenza 3 Negative Negative Final   Metapneumovirus Negative Negative Final   Rhinovirus Negative Negative Final   Adenovirus Negative Negative Final    Comment: (NOTE) Performed At: [facility] [address] [place], [place] [id number] Mila Homer MD WU:[id number]   Culture, blood (routine x 2)     Status: None   Collection Time: 02/01/15  4:30 AM  Result Value Ref Range Status  Specimen Description BLOOD L IJ CVC  Final   Special Requests BOTTLES DRAWN AEROBIC AND ANAEROBIC 10CC  Final   Culture  Setup Time   Final    GRAM POSITIVE COCCI IN CLUSTERS ANAEROBIC BOTTLE ONLY CRITICAL RESULT CALLED TO, READ BACK BY AND VERIFIED WITH: J [email or web address]@[id number]  02/03/15 M KELLY    Culture   Final    STAPHYLOCOCCUS SPECIES (COAGULASE NEGATIVE) THE SIGNIFICANCE OF ISOLATING THIS ORGANISM FROM A SINGLE SET OF BLOOD CULTURES WHEN MULTIPLE SETS ARE DRAWN IS UNCERTAIN. PLEASE NOTIFY THE MICROBIOLOGY DEPARTMENT WITHIN ONE WEEK IF SPECIATION AND SENSITIVITIES ARE REQUIRED.    Report Status 02/05/2015 FINAL  Final  Culture, blood (routine x 2)     Status: None (Preliminary result)    Collection Time: 02/01/15  4:49 AM  Result Value Ref Range Status   Specimen Description BLOOD LEFT HAND  Final   Special Requests BOTTLES DRAWN AEROBIC AND ANAEROBIC 5CC  Final   Culture NO GROWTH 4 DAYS  Final   Report Status PENDING  Incomplete  Culture, blood (routine x 2)     Status: None (Preliminary result)   Collection Time: 02/03/15 12:21 PM  Result Value Ref Range Status   Specimen Description BLOOD RIGHT HAND  Final   Special Requests BOTTLES DRAWN AEROBIC AND ANAEROBIC 10CC  Final   Culture NO GROWTH 2 DAYS  Final   Report Status PENDING  Incomplete  Culture, blood (routine x 2)     Status: None (Preliminary result)   Collection Time: 02/03/15 12:30 PM  Result Value Ref Range Status   Specimen Description BLOOD LEFT HAND  Final   Special Requests BOTTLES DRAWN AEROBIC AND ANAEROBIC 10CC  Final   Culture NO GROWTH 2 DAYS  Final   Report Status PENDING  Incomplete    Assessment: 40 YOM who was admitted for respiratory failure and subsequently intubated. Currently on IV Vancomycin, Levaquin and IV Gentamicin for severe CAP. WBC down to 9 today. Afebrile today. Abx D#6. SCr 1.5, normalized CrCl of 65 mL/min   Vancomycin trough 11 mcg/ml (subtherapeutic) on  IV q24h  9/4 Vanc>> 9/4 Aztreonam>>9/7 9/4 Levaquin>> 9/7 Gent>>   9//4 urinary legionella>> neg  9/4 urinary strep>>neg 9/4 resp viral>>neg  9/4 blood>> 1/2 CNS 9/6 blood>>  Goal of Therapy:  Vancomycin trough level 15-20 mcg/ml  Plan:  Change Vancomycin to  IV q18h (predicted trough 17 mcg/ml) Monitor CBC, renal fx, cultures, and clinical progress Vanc trough at new Css if continues  Christoper Fabian, PharmD, BCPS Clinical pharmacist, pager [phone] 02/06/2015 6:13 AM

## 2015-02-06 NOTE — Progress Notes (Signed)
Wasted 5 ml of versed in sink with Irven Coe, RN - Heloise Purpura, RN

## 2015-02-06 NOTE — Progress Notes (Signed)
PULMONARY / CRITICAL CARE MEDICINE   Name: Adam Benson MRN: [id number] DOB: 1960-09-01    ADMISSION DATE:  02/01/2015 CONSULTATION DATE:  02/01/2015  REFERRING MD :  Jeanene Erb that heRandolph  CHIEF COMPLAINT:  Severe CAP  INITIAL PRESENTATION: Presented to [facility] , being managed for pna, ards  STUDIES:  Portable CXR - bilateral opacities. ETT about 6 cm above carina.  SIGNIFICANT EVENTS: 9/4 - intubated 9/4 - transferred from OSH 9/5- some brady from Cardizem, amio 9/6- lasix started 9/7- fib rvr, neg 4.6 liters 9/8- more rvr, neg 4 liters  SUBJECTIVE: Cardizem drip, aggitation  VITAL SIGNS: Temp:  [97.8 F (36.6 C)-101.4 F (38.6 C)] 101.4 F (38.6 C) (09/09 0834) Pulse Rate:  [67-152] 137 (09/09 0900) Resp:  [18-24] 21 (09/09 0900) BP: (91-149)/(41-97) 106/62 mmHg (09/09 0900) SpO2:  [92 %-98 %] 94 % (09/09 0900) FiO2 (%):  [40 %-50 %] 50 % (09/09 0841) Weight:  [169.645 kg (374 lb)] 169.645 kg (374 lb) (09/09 0423) HEMODYNAMICS:   VENTILATOR SETTINGS: Vent Mode:  [-] PSV;CPAP FiO2 (%):  [40 %-50 %] 50 % Set Rate:  [22 bmp] 22 bmp Vt Set:  [470 mL] 470 mL PEEP:  [8 cmH20-10 cmH20] 8 cmH20 Plateau Pressure:  [19 cmH20-26 cmH20] 19 cmH20 INTAKE / OUTPUT:  Intake/Output Summary (Last 24 hours) at 02/06/15 1020 Last data filed at 02/06/15 0840  Gross per 24 hour  Intake 3953.84 ml  Output   7440 ml  Net -3486.16 ml    PHYSICAL EXAMINATION: General: sedated rass 2 Neuro: rass 2, needs more sedation HEENT: line clean left, clean PULM: less coarse, has vent dyschony CV: s1 s2 irt GI: soft, obese, nt, nd, no r Extremities: edema mild reduced   LABS:  CBC  Recent Labs Lab 02/04/15 0402 02/05/15 0644 02/06/15 0520  WBC 9.3 9.0 12.5*  HGB 9.7* 10.8* 11.5*  HCT 34.0* 38.2* 39.5  PLT 272 267 313   Coag's  Recent Labs Lab 02/01/15 0507  INR 1.66*   BMET  Recent Labs Lab 02/05/15 0644 02/05/15 1905 02/06/15 0845  NA 148* 152*  152*  K 3.3* 4.3 4.0  CL 97* 98* 98*  CO2 43* 45* 46*  BUN 46* 50* 50*  CREATININE 1.29* 1.50* 1.73*  GLUCOSE 132* 166* 146*   Electrolytes  Recent Labs Lab 02/03/15 0538 02/03/15 1014 02/03/15 2047  02/05/15 0644 02/05/15 1905 02/05/15 1906 02/06/15 0845  CALCIUM 8.4*  --  8.2*  < > 8.8* 9.0  --  8.9  MG 2.4 2.5* 2.1  --   --   --  2.0  --   PHOS 2.8 2.5 3.8  --   --   --   --   --   < > = values in this interval not displayed. Sepsis Markers  Recent Labs Lab 02/01/15 0507 02/01/15 0755 02/01/15 2002  LATICACIDVEN  --  1.1 1.0  PROCALCITON 1.29  --   --    ABG  Recent Labs Lab 02/04/15 0821 02/05/15 0355 02/06/15 0335  PHART 7.315* 7.461* 7.395  PCO2ART 82.9* 60.2* 77.6*  PO2ART 62.0* 63.0* 82.5   Liver Enzymes  Recent Labs Lab 02/01/15 0507 02/04/15 0402 02/05/15 0644  AST 20 16 20   ALT 11* 11* 16*  ALKPHOS 32* 32* 34*  BILITOT 0.6 0.4 0.7  ALBUMIN 3.0* 2.5* 2.5*   Cardiac Enzymes  Recent Labs Lab 02/01/15 0754 02/01/15 2001  TROPONINI <0.03 <0.03   Glucose  Recent Labs Lab 02/05/15 1148 02/05/15 1549  02/05/15 1928 02/05/15 2343 02/06/15 0348 02/06/15 0751  GLUCAP 129* 122* 150* 127* 125* 127*    Imaging Dg Chest Port 1 View  02/06/2015   CLINICAL DATA:  ARDS.  EXAM: PORTABLE CHEST - 1 VIEW  COMPARISON:  02/05/2015.  FINDINGS: Endotracheal tube, left IJ line in stable position. Interim removal of NG tube. Stable cardiomegaly. Diffuse unchanged bilateral pulmonary infiltrates and or edema. Tiny pleural effusions cannot be excluded. No prominent pleural effusion noted. No pneumothorax.  IMPRESSION: 1. Lines and tubes in stable position. 2. Diffuse unchanged bilateral airspace disease. 3. Stable severe cardiomegaly.   Electronically Signed   By: Maisie Fus  Register   On: 02/06/2015 07:19   ASSESSMENT / PLAN:  PULMONARY OETT 9/4>> A: Acute on chronic hypoxic respiratory failure Severe community acquired pneumonia Severe ARDS improved  with diuresis daily P:   -even balance goals today, see renal -abg reviewed, keep same MV, ph wnl -peep to 8 successful -pcxr in am -wean attempt cpap 8 ps 10 done, failed, but was a start  CARDIOVASCULAR R IJ CVL 9/4>>> A:  Septic shock Elevated BNP History of atrial fibrillation 9/7, 9.9 RVR Suspected diastolic congestive heart failure P:  -Control fevers -Cardizem drip re required, bolus this am , control agitation   - continued amio, may need further bolus  RENAL A:  Chronic kidney disease ARDS Hypernatremia with lasix ( have maxed effect on ards), slight overdiuresis hypoK  P:   -kvo -dc lasix -chem in am  -add free water  GASTROINTESTINAL A:   History of peptic ulcer disease December 2015 BM noted, massive yeah!! 9/7  P:    - Protonix via tube daily - TF to goal  HEMATOLOGIC A:   Leukocytosis hemoconcetration wbc P:  -cbc in am  - SCDs - Heparin subcutaneous  INFECTIOUS A:   Severe CAP Likley contamination BC Spiking fever, missing organism pcn allergy P:   Urine Leg Ag 9/4>>pending Urine Strep Ag 9/4>>negative Respiratory Viral PCR 9/4>>neg  BCx2 9/4>>1/2 GPC UC 9/4>> Sputum 9/4>>  Abx: Vancomycin 9/4>>>9/9 Aztreonam 9/4>>>9/7 Levaquin 9/4>>>stop 11th Gent 9/7>>>9/9  Rocephin 9/3>>9/3 (given at OSH) Azithromycin 9/3>>9/3 (given at OSH)  Dc vanc Dc gent Maintain levo Add stop date  ENDOCRINE A:   Hyperglycemia    P:   ssi  NEUROLOGIC A:   ARDS Severe dyschrony  P:   - RASS goal: -3 - Versed and fentanyl drip, max - add rispirdal - WUA  FAMILY  - Updates:  Dad updated by me daily  - Inter-disciplinary family meet or Palliative Care meeting due by:  9/11  Ccm time 35 min   [name]. Tyson Alias, MD, FACP Pgr: [phone] [place] [facility]  Update: fever noted, consider bronch lavage, received courses of abx, organism>? Re ad vanc

## 2015-02-06 NOTE — Procedures (Signed)
Bronchoscopy Procedure Note- fevers, organisms? Adam Benson [id number] 08/26/1960  Procedure: Bronchoscopy Indications: Diagnostic evaluation of the airways and Obtain specimens for culture and/or other diagnostic studies  Procedure Details Consent: Risks of procedure as well as the alternatives and risks of each were explained to the (patient/caregiver).  Consent for procedure obtained. Time Out: Verified patient identification, verified procedure, site/side was marked, verified correct patient position, special equipment/implants available, medications/allergies/relevent history reviewed, required imaging and test results available.  Performed  In preparation for procedure, patient was given 100% FiO2 and bronchoscope lubricated. Sedation: Etomidate  Airway entered and the following bronchi were examined: RUL, RML, RLL, LUL, LLL and Bronchi.   Procedures performed: Brushings performed - no Bronchoscope removed.  , Patient placed back on 100% FiO2 at conclusion of procedure.    Evaluation Hemodynamic Status: BP stable throughout; O2 sats: stable throughout Patient's Current Condition: stable Specimens:  Sent purulent fluid Complications: No apparent complications Patient did tolerate procedure well.   Adam Benson. 02/06/2015   1. Thick white pus trachea and bilateral mains, suctioned 2. No sig distal plugging 3. BAL lingula done white pus Adam Benson. Tyson Alias, MD, FACP Pgr: [phone] [place] [facility]

## 2015-02-06 NOTE — Progress Notes (Signed)
CRITICAL VALUE ALERT  Critical value received: PCO2 77.6 Date of notification: 02-06-15  Time of notification: 0355  Critical value read back:Yes.    Nurse who received alert:  Stefani Dama  MD notified (1st page):  Dr. Delton Coombes (E-Link)  Time of first page:  0400  MD notified (2nd page):  Time of second page:  Responding MD: Dr. Delton Coombes (E-Link Nurse to notify MD)  Time MD responded: [phone]

## 2015-02-07 DIAGNOSIS — I5031 Acute diastolic (congestive) heart failure: Secondary | ICD-10-CM | POA: Diagnosis present

## 2015-02-07 DIAGNOSIS — N183 Chronic kidney disease, stage 3 unspecified: Secondary | ICD-10-CM | POA: Diagnosis present

## 2015-02-07 LAB — POCT I-STAT 3, ART BLOOD GAS (G3+)
ACID-BASE EXCESS: 19 mmol/L — AB (ref 0.0–2.0)
Bicarbonate: 45.6 mEq/L — ABNORMAL HIGH (ref 20.0–24.0)
O2 Saturation: 95 %
PO2 ART: 83 mmHg (ref 80.0–100.0)
Patient temperature: 101.5
TCO2: 47 mmol/L (ref 0–100)
pCO2 arterial: 66.2 mmHg (ref 35.0–45.0)
pH, Arterial: 7.452 — ABNORMAL HIGH (ref 7.350–7.450)

## 2015-02-07 LAB — GLUCOSE, CAPILLARY
GLUCOSE-CAPILLARY: 122 mg/dL — AB (ref 65–99)
GLUCOSE-CAPILLARY: 139 mg/dL — AB (ref 65–99)
GLUCOSE-CAPILLARY: 141 mg/dL — AB (ref 65–99)
Glucose-Capillary: 146 mg/dL — ABNORMAL HIGH (ref 65–99)
Glucose-Capillary: 143 mg/dL — ABNORMAL HIGH (ref 65–99)
Glucose-Capillary: 161 mg/dL — ABNORMAL HIGH (ref 65–99)

## 2015-02-07 LAB — BASIC METABOLIC PANEL
ANION GAP: 6 (ref 5–15)
BUN: 50 mg/dL — ABNORMAL HIGH (ref 6–20)
CALCIUM: 8.5 mg/dL — AB (ref 8.9–10.3)
CO2: 46 mmol/L — ABNORMAL HIGH (ref 22–32)
Chloride: 98 mmol/L — ABNORMAL LOW (ref 101–111)
Creatinine, Ser: 1.98 mg/dL — ABNORMAL HIGH (ref 0.61–1.24)
GFR, EST AFRICAN AMERICAN: 43 mL/min — AB (ref >60)
GFR, EST NON AFRICAN AMERICAN: 37 mL/min — AB (ref >60)
Glucose, Bld: 128 mg/dL — ABNORMAL HIGH (ref 65–99)
POTASSIUM: 3.9 mmol/L (ref 3.5–5.1)
Sodium: 150 mmol/L — ABNORMAL HIGH (ref 135–145)

## 2015-02-07 MED ORDER — FREE WATER
400.0000 mL | Freq: Four times a day (QID) | Status: DC
  Administered 2015-02-07 – 2015-02-10 (×11): 400 mL

## 2015-02-07 NOTE — Progress Notes (Signed)
Critical ABG results called to [facility]. Given to RN. RN will report to Dr. Delford Field.

## 2015-02-07 NOTE — Progress Notes (Signed)
eLink Physician-Brief Progress Note Patient Name: Adam Benson DOB: September 24, 1960 MRN: [id number]   Date of Service  02/07/2015  HPI/Events of Note  History of ARDS now recurrent ARDS.  Note pt of mine in [place].  Pt also with acute diastolic HF and CKD stage III  eICU Interventions  See associated diagnoses on prob list      Intervention Category Major Interventions: Sepsis - evaluation and management;Respiratory failure - evaluation and management  Shan Levans 02/07/2015, 9:25 PM

## 2015-02-07 NOTE — Progress Notes (Signed)
PULMONARY / CRITICAL CARE MEDICINE   Name: Adam Benson MRN: [id number] DOB: 24-Mar-1961    ADMISSION DATE:  02/01/2015 CONSULTATION DATE:  02/01/2015  REFERRING MD :  Jeanene Erb that heRandolph  CHIEF COMPLAINT:  Severe CAP  INITIAL PRESENTATION: Presented to [facility] , being managed for pna, ards  STUDIES:  Portable CXR - bilateral opacities. ETT about 6 cm above carina.  SIGNIFICANT EVENTS: 9/4 - intubated 9/4 - transferred from OSH 9/5- some brady from Cardizem, amio 9/6- lasix started 9/7- fib rvr, neg 4.6 liters 9/8- more rvr, neg 4 liters 9/9- Bronchoscope. Purulent secretions.   SUBJECTIVE: Persistent fevers.  VITAL SIGNS: Temp:  [101.3 F (38.5 C)-103 F (39.4 C)] 103 F (39.4 C) (09/10 1611) Pulse Rate:  [97-169] 102 (09/10 1515) Resp:  [15-27] 24 (09/10 1515) BP: (67-121)/(35-71) 93/35 mmHg (09/10 1515) SpO2:  [92 %-100 %] 98 % (09/10 1515) FiO2 (%):  [50 %-60 %] 50 % (09/10 1515) Weight:  [378 lb 5 oz (171.6 kg)] 378 lb 5 oz (171.6 kg) (09/10 0401) HEMODYNAMICS:   VENTILATOR SETTINGS: Vent Mode:  [-] PRVC FiO2 (%):  [50 %-60 %] 50 % Set Rate:  [22 bmp] 22 bmp Vt Set:  [470 mL] 470 mL PEEP:  [8 cmH20] 8 cmH20 Pressure Support:  [5 cmH20] 5 cmH20 Plateau Pressure:  [12 cmH20-22 cmH20] 22 cmH20 INTAKE / OUTPUT:  Intake/Output Summary (Last 24 hours) at 02/07/15 1632 Last data filed at 02/07/15 1423  Gross per 24 hour  Intake 5571.4 ml  Output   3075 ml  Net 2496.4 ml    PHYSICAL EXAMINATION: General: sedated rass 2 Neuro: rass 2, needs more sedation HEENT: line clean left, clean PULM: less coarse, has vent dyschony CV: s1 s2 irt GI: soft, obese, nt, nd, no r Extremities: edema mild reduced   LABS:  CBC  Recent Labs Lab 02/04/15 0402 02/05/15 0644 02/06/15 0520  WBC 9.3 9.0 12.5*  HGB 9.7* 10.8* 11.5*  HCT 34.0* 38.2* 39.5  PLT 272 267 313   Coag's  Recent Labs Lab 02/01/15 0507 02/06/15 1158  APTT  --  30  INR 1.66*   --    BMET  Recent Labs Lab 02/05/15 1905 02/06/15 0845 02/07/15 0357  NA 152* 152* 150*  K 4.3 4.0 3.9  CL 98* 98* 98*  CO2 45* 46* 46*  BUN 50* 50* 50*  CREATININE 1.50* 1.73* 1.98*  GLUCOSE 166* 146* 128*   Electrolytes  Recent Labs Lab 02/03/15 0538 02/03/15 1014 02/03/15 2047  02/05/15 1905 02/05/15 1906 02/06/15 0845 02/07/15 0357  CALCIUM 8.4*  --  8.2*  < > 9.0  --  8.9 8.5*  MG 2.4 2.5* 2.1  --   --  2.0  --   --   PHOS 2.8 2.5 3.8  --   --   --   --   --   < > = values in this interval not displayed. Sepsis Markers  Recent Labs Lab 02/01/15 0507 02/01/15 0755 02/01/15 2002  LATICACIDVEN  --  1.1 1.0  PROCALCITON 1.29  --   --    ABG  Recent Labs Lab 02/04/15 0821 02/05/15 0355 02/06/15 0335  PHART 7.315* 7.461* 7.395  PCO2ART 82.9* 60.2* 77.6*  PO2ART 62.0* 63.0* 82.5   Liver Enzymes  Recent Labs Lab 02/01/15 0507 02/04/15 0402 02/05/15 0644  AST 20 16 20   ALT 11* 11* 16*  ALKPHOS 32* 32* 34*  BILITOT 0.6 0.4 0.7  ALBUMIN 3.0* 2.5* 2.5*  Cardiac Enzymes  Recent Labs Lab 02/01/15 0754 02/01/15 2001  TROPONINI <0.03 <0.03   Glucose  Recent Labs Lab 02/06/15 1927 02/06/15 2358 02/07/15 0346 02/07/15 0815 02/07/15 1201 02/07/15 1613  GLUCAP 141* 141* 122* 146* 143* 161*    Imaging No results found. ASSESSMENT / PLAN:  PULMONARY OETT 9/4>> A: Acute on chronic hypoxic respiratory failure Severe community acquired pneumonia Severe ARDS improved with diuresis daily P:   -even balance goals today as BUN/Cr are increasing -Wean down PEEP to 5.   CARDIOVASCULAR R IJ CVL 9/4>>> A:  Septic shock Elevated BNP History of atrial fibrillation 9/7, 9.9 RVR Suspected diastolic congestive heart failure P:  -Control fevers with tylenol and cooling blanket.  - Cardizem drip  - Continued amio, may need further bolus  RENAL A:  Chronic kidney disease ARDS Hypernatremia with lasix ( have maxed effect on ards),  slight overdiuresis hypoK  P:   -Off lasix now. -chem in am  -Free water for hypernatremia.  GASTROINTESTINAL A:   History of peptic ulcer disease December 2015 BM noted, massive yeah!! 9/7  P:    - Protonix via tube daily - TF to goal  HEMATOLOGIC A:   Leukocytosis hemoconcetration wbc P:  -cbc in am  - SCDs - Heparin subcutaneous  INFECTIOUS A:   Severe CAP Likley contamination BC Spiking fever, missing organism pcn allergy P:   Urine Leg Ag 9/4>>pending Urine Strep Ag 9/4>>negative Respiratory Viral PCR 9/4>>neg  BCx2 9/4>>1/2 GPC UC 9/4>> Sputum 9/4>>  Abx: Vancomycin 9/4>>>9/9 Aztreonam 9/4>>>9/7 Levaquin 9/4>>>stop 11th Gent 9/7>>>9/9  Rocephin 9/3>>9/3 (given at OSH) Azithromycin 9/3>>9/3 (given at OSH)  Linezolid 9/4 >> Primaxin 9/4>> Follow bronch cultures.  ENDOCRINE A:   Hyperglycemia    P:   ssi  NEUROLOGIC A:   ARDS Severe dyschrony  P:   - RASS goal: -3 - Versed and fentanyl drip, max - add rispirdal - WUA  FAMILY  - Updates:  Dad updated by me daily  - Inter-disciplinary family meet or Palliative Care meeting due by:  9/11  Ccm time 35 min   Ozro Russett MD [place] [facility] Pager [phone] If no answer or after 3pm call: [phone] 02/07/2015, 4:42 PM

## 2015-02-08 ENCOUNTER — Inpatient Hospital Stay (HOSPITAL_COMMUNITY): Payer: Medicaid Other

## 2015-02-08 LAB — CULTURE, BLOOD (ROUTINE X 2)
Culture: NO GROWTH
Culture: NO GROWTH

## 2015-02-08 LAB — BASIC METABOLIC PANEL
ANION GAP: 6 (ref 5–15)
BUN: 40 mg/dL — ABNORMAL HIGH (ref 6–20)
CALCIUM: 8.7 mg/dL — AB (ref 8.9–10.3)
CO2: 41 mmol/L — ABNORMAL HIGH (ref 22–32)
Chloride: 104 mmol/L (ref 101–111)
Creatinine, Ser: 1.63 mg/dL — ABNORMAL HIGH (ref 0.61–1.24)
GFR, EST AFRICAN AMERICAN: 55 mL/min — AB (ref >60)
GFR, EST NON AFRICAN AMERICAN: 47 mL/min — AB (ref >60)
GLUCOSE: 172 mg/dL — AB (ref 65–99)
Potassium: 3.6 mmol/L (ref 3.5–5.1)
Sodium: 151 mmol/L — ABNORMAL HIGH (ref 135–145)

## 2015-02-08 LAB — GLUCOSE, CAPILLARY
GLUCOSE-CAPILLARY: 162 mg/dL — AB (ref 65–99)
GLUCOSE-CAPILLARY: 152 mg/dL — AB (ref 65–99)
GLUCOSE-CAPILLARY: 137 mg/dL — AB (ref 65–99)
Glucose-Capillary: 135 mg/dL — ABNORMAL HIGH (ref 65–99)
Glucose-Capillary: 159 mg/dL — ABNORMAL HIGH (ref 65–99)
Glucose-Capillary: 168 mg/dL — ABNORMAL HIGH (ref 65–99)

## 2015-02-08 MED ORDER — DEXTROSE 5 % IV SOLN
INTRAVENOUS | Status: DC
  Administered 2015-02-08 – 2015-02-09 (×2): via INTRAVENOUS

## 2015-02-08 MED ORDER — LEVALBUTEROL HCL 0.63 MG/3ML IN NEBU
0.6300 mg | INHALATION_SOLUTION | Freq: Three times a day (TID) | RESPIRATORY_TRACT | Status: DC
  Administered 2015-02-08 – 2015-02-21 (×40): 0.63 mg via RESPIRATORY_TRACT
  Filled 2015-02-08 (×75): qty 3

## 2015-02-08 MED ORDER — POTASSIUM CHLORIDE 20 MEQ/15ML (10%) PO SOLN
20.0000 meq | ORAL | Status: AC
  Administered 2015-02-08 (×2): 20 meq
  Filled 2015-02-08 (×2): qty 15

## 2015-02-08 NOTE — Progress Notes (Signed)
[facility] ADULT ICU REPLACEMENT PROTOCOL FOR AM LAB REPLACEMENT ONLY  The patient does apply for the [facility] Adult ICU Electrolyte Replacment Protocol based on the criteria listed below:   1. Is GFR >/= 40 ml/min? Yes.    Patient's GFR today is 47 2. Is urine output >/= 0.5 ml/kg/hr for the last 6 hours? Yes.   Patient's UOP is 1.0 ml/kg/hr 3. Is BUN < 60 mg/dL? Yes.    Patient's BUN today is 40 4. Abnormal electrolyte(s): Potassium, 3.6 5. Ordered repletion with: [facility] adult ICU replacement protocol 6. If a panic level lab has been reported, has the CCM MD in charge been notified? Yes.  .   Physician:  Dr. Loletha Grayer  [facility], Alda Berthold E 02/08/2015 5:58 AM

## 2015-02-08 NOTE — Progress Notes (Signed)
eLink Physician-Brief Progress Note Patient Name: Adam Benson DOB: Feb 21, 1961 MRN: [id number]   Date of Service  02/08/2015  HPI/Events of Note  ARDS CXR this afternoon with ETT 5 cm above carina  eICU Interventions  Advance ETT 2cm     Intervention Category Major Interventions: Respiratory failure - evaluation and management  Swayze Pries 02/08/2015, 5:33 PM

## 2015-02-08 NOTE — Progress Notes (Addendum)
PULMONARY / CRITICAL CARE MEDICINE   Name: Adam Benson MRN: [id number] DOB: August 13, 1960    ADMISSION DATE:  02/01/2015 CONSULTATION DATE:  02/01/2015  REFERRING MD :  [facility]   CHIEF COMPLAINT:  Severe CAP  INITIAL PRESENTATION: 54 y/o M transferred from [facility] with dyspnea, weight gain, LE edema.  Failed bipap, intubated & transferred to [facility].  Found to  Have PNA with ARDS. ICU course complicated by atrial fibrillation with RVR requiring cardizem/amio gtts, bradycardia, volume overload and poor ventilator synchrony requiring vasopressors.    STUDIES:  9/04  Portable CXR - bilateral opacities. ETT about 6 cm above carina.  SIGNIFICANT EVENTS: 9/04  intubated 9/04  transferred from OSH 9/05  some brady from Cardizem, amio 9/06  lasix started 9/07  fib rvr, neg 4.6 liters 9/08  more rvr, neg 4 liters 9/09  FOB. Purulent secretions. Fever 103  SUBJECTIVE:  RN reports pt remains on amio @ 30, cardizem at 5, fentanyl @ 300, versed @ 8, and levophed at 14 mcg.  Pt more calm/alert this am.  No acute events overnight.  Tmax 99 (improved)  VITAL SIGNS: Temp:  [99.5 F (37.5 C)-103 F (39.4 C)] 99.6 F (37.6 C) (09/11 0700) Pulse Rate:  [76-169] 92 (09/11 0900) Resp:  [14-24] 14 (09/11 0900) BP: (67-143)/(35-81) 109/58 mmHg (09/11 0900) SpO2:  [91 %-100 %] 94 % (09/11 0900) FiO2 (%):  [40 %-50 %] 40 % (09/11 0842) Weight:  [368 lb (166.924 kg)] 368 lb (166.924 kg) (09/11 0421)   HEMODYNAMICS:     VENTILATOR SETTINGS: Vent Mode:  [-] PSV;CPAP FiO2 (%):  [40 %-50 %] 40 % Set Rate:  [22 bmp] 22 bmp Vt Set:  [470 mL] 470 mL PEEP:  [5 cmH20-8 cmH20] 5 cmH20 Pressure Support:  [10 cmH20] 10 cmH20 Plateau Pressure:  [20 cmH20-22 cmH20] 22 cmH20   INTAKE / OUTPUT:  Intake/Output Summary (Last 24 hours) at 02/08/15 0934 Last data filed at 02/08/15 0800  Gross per 24 hour  Intake 5433.45 ml  Output   3500 ml  Net 1933.45 ml    PHYSICAL EXAMINATION: General:  obese adult male on vent, NAD Neuro: calm, opens eyes to voice, no follow commands, generalized weakness HEENT: OETT, MM pink/moist, unable to assess JVD PULM: even/non-labored, lungs bilaterally with coarse rhonchi CV: s1 s2 irr irr, distant tones GI: NTND, bsx4 active  Extremities: space boots, trace LE edema, generalized edema upper extremities    LABS:  CBC  Recent Labs Lab 02/04/15 0402 02/05/15 0644 02/06/15 0520  WBC 9.3 9.0 12.5*  HGB 9.7* 10.8* 11.5*  HCT 34.0* 38.2* 39.5  PLT 272 267 313   Coag's  Recent Labs Lab 02/06/15 1158  APTT 30   BMET  Recent Labs Lab 02/06/15 0845 02/07/15 0357 02/08/15 0401  NA 152* 150* 151*  K 4.0 3.9 3.6  CL 98* 98* 104  CO2 46* 46* 41*  BUN 50* 50* 40*  CREATININE 1.73* 1.98* 1.63*  GLUCOSE 146* 128* 172*   Electrolytes  Recent Labs Lab 02/03/15 0538 02/03/15 1014 02/03/15 2047  02/05/15 1906 02/06/15 0845 02/07/15 0357 02/08/15 0401  CALCIUM 8.4*  --  8.2*  < >  --  8.9 8.5* 8.7*  MG 2.4 2.5* 2.1  --  2.0  --   --   --   PHOS 2.8 2.5 3.8  --   --   --   --   --   < > = values in this interval not displayed.  Sepsis Markers  Recent Labs Lab 02/01/15 2002  LATICACIDVEN 1.0   ABG  Recent Labs Lab 02/05/15 0355 02/06/15 0335 02/07/15 2109  PHART 7.461* 7.395 7.452*  PCO2ART 60.2* 77.6* 66.2*  PO2ART 63.0* 82.5 83.0   Liver Enzymes  Recent Labs Lab 02/04/15 0402 02/05/15 0644  AST 16 20  ALT 11* 16*  ALKPHOS 32* 34*  BILITOT 0.4 0.7  ALBUMIN 2.5* 2.5*   Cardiac Enzymes  Recent Labs Lab 02/01/15 2001  TROPONINI <0.03   Glucose  Recent Labs Lab 02/07/15 0815 02/07/15 1201 02/07/15 1613 02/07/15 1955 02/07/15 2357 02/08/15 0805  GLUCAP 146* 143* 161* 139* 168* 162*    Imaging No results found.   ASSESSMENT / PLAN:  PULMONARY OETT 9/4 >> A: Acute on chronic hypoxic respiratory failure - in the setting of severe CAP / ARDS  Severe community acquired  pneumonia Severe ARDS - improved with diuresis  P:   MV support Wean PEEP / FiO2 Trend CXR Daily SBT/WUA once pulmonary status allows PRN ABG  CARDIOVASCULAR R IJ CVL 9/4 >> 9/9 L IJ CVL 9/9 >>  A:  Septic Shock - in setting of CAP Elevated BNP History of atrial fibrillation - 9/7, 9.9 with episodes of RVR Suspected diastolic congestive heart failure P:  Control fevers with tylenol and cooling blanket.  Continue Cardizem / Amiodarone drips  Tele monitoring  ASA 325 mg QD  RENAL A:  Chronic Kidney Disease III ARDS Hypernatremia - with lasix (have maxed effect on ards), slight overdiuresis HypoK  P:   Trend BMP / UOP  Even balance 9/11 Replace electrolytes as indicated  Free water for hypernatremia. D5W@50  ml/hr   GASTROINTESTINAL A:   History of PUD -  December 2015 Constipation - BM 9/7 P:    Protonix via tube daily TF to goal Bowel regimen: miralax, senokot.  Hold for diarrhea   HEMATOLOGIC A:   Leukocytosis Hemoconcentration Mild Anemia  P:  Trend CBC SCDs / Heparin for DVT prophylaxis   INFECTIOUS A:   Severe CAP Likley contamination BC Spiking fever, missing organism pcn allergy P:   Urine Leg Ag 9/4 >> pending Urine Strep Ag 9/4 >> negative Respiratory Viral PCR 9/4 >> neg  BCx2 9/4 >>1/2 GPC >> suspected contaminant  UC 9/4 >> Sputum 9/4 >> BCx2 9/6 >>  BCx2 9/9 >>  BAL / FOB 9/9 >> GPC's pairs/clusters >>   Abx: Vancomycin 9/4 >> 9/9 Aztreonam 9/4 >>9 /7 Levaquin 9/4 >> 9/11 Gent 9/7 >> 9/9  Rocephin 9/3 >> 9/3 (given at OSH) Azithromycin 9/3 >> 9/3 (given at OSH)  Linezolid 9/4 >> Primaxin 9/4>> Follow bronch cultures Pending cultures 9/12, consider narrowing abx further   ENDOCRINE A:   Hyperglycemia  P:   SSI  NEUROLOGIC A:   Agitated Delirium - in the setting of ARDS/CAP P:   RASS goal: -1 Versed and fentanyl drip, max Risperdal 1 mg BID Daily WUA   FAMILY  - Updates:  Dad updated by me daily  -  Inter-disciplinary family meet or Palliative Care meeting due by:  9/11  Ccm time 35 min   Michaiah Maiden MD [place] [facility] Pager [phone] If no answer or after 3pm call: [phone] 02/08/2015, 9:34 AM

## 2015-02-08 NOTE — Progress Notes (Signed)
ET pulled back 2 cm to 22 at the lip.

## 2015-02-09 ENCOUNTER — Inpatient Hospital Stay (HOSPITAL_COMMUNITY): Payer: Medicaid Other

## 2015-02-09 LAB — BASIC METABOLIC PANEL
Anion gap: 6 (ref 5–15)
BUN: 39 mg/dL — ABNORMAL HIGH (ref 6–20)
CO2: 37 mmol/L — ABNORMAL HIGH (ref 22–32)
Calcium: 8.3 mg/dL — ABNORMAL LOW (ref 8.9–10.3)
Chloride: 102 mmol/L (ref 101–111)
Creatinine, Ser: 1.4 mg/dL — ABNORMAL HIGH (ref 0.61–1.24)
GFR calc Af Amer: >60 mL/min (ref >60)
GFR calc non Af Amer: 57 mL/min — ABNORMAL LOW (ref >60)
Glucose, Bld: 121 mg/dL — ABNORMAL HIGH (ref 65–99)
Potassium: 4.2 mmol/L (ref 3.5–5.1)
Sodium: 145 mmol/L (ref 135–145)

## 2015-02-09 LAB — CULTURE, RESPIRATORY

## 2015-02-09 LAB — CBC
HCT: 36.9 % — ABNORMAL LOW (ref 39.0–52.0)
Hemoglobin: 9.9 g/dL — ABNORMAL LOW (ref 13.0–17.0)
MCH: 20.1 pg — ABNORMAL LOW (ref 26.0–34.0)
MCHC: 26.8 g/dL — ABNORMAL LOW (ref 30.0–36.0)
MCV: 74.8 fL — ABNORMAL LOW (ref 78.0–100.0)
Platelets: 378 10*3/uL (ref 150–400)
RBC: 4.93 MIL/uL (ref 4.22–5.81)
RDW: 21.3 % — ABNORMAL HIGH (ref 11.5–15.5)
WBC: 12.8 10*3/uL — ABNORMAL HIGH (ref 4.0–10.5)

## 2015-02-09 LAB — GLUCOSE, CAPILLARY
GLUCOSE-CAPILLARY: 111 mg/dL — AB (ref 65–99)
GLUCOSE-CAPILLARY: 131 mg/dL — AB (ref 65–99)
GLUCOSE-CAPILLARY: 146 mg/dL — AB (ref 65–99)
Glucose-Capillary: 126 mg/dL — ABNORMAL HIGH (ref 65–99)
Glucose-Capillary: 146 mg/dL — ABNORMAL HIGH (ref 65–99)
Glucose-Capillary: 160 mg/dL — ABNORMAL HIGH (ref 65–99)

## 2015-02-09 LAB — CULTURE, RESPIRATORY W GRAM STAIN

## 2015-02-09 MED ORDER — SODIUM CHLORIDE 0.9 % IV SOLN
200.0000 mg | Freq: Once | INTRAVENOUS | Status: AC
  Administered 2015-02-09: 200 mg via INTRAVENOUS
  Filled 2015-02-09: qty 200

## 2015-02-09 MED ORDER — SODIUM CHLORIDE 0.9 % IV SOLN
500.0000 mg | Freq: Four times a day (QID) | INTRAVENOUS | Status: DC
  Administered 2015-02-09 – 2015-02-11 (×7): 500 mg via INTRAVENOUS
  Filled 2015-02-09 (×9): qty 500

## 2015-02-09 MED ORDER — SODIUM CHLORIDE 0.9 % IV SOLN
100.0000 mg | INTRAVENOUS | Status: DC
  Administered 2015-02-10 – 2015-02-12 (×3): 100 mg via INTRAVENOUS
  Filled 2015-02-09 (×3): qty 100

## 2015-02-09 MED ORDER — SODIUM CHLORIDE 0.9 % IV SOLN
500.0000 mg | Freq: Four times a day (QID) | INTRAVENOUS | Status: DC
  Filled 2015-02-09 (×3): qty 500

## 2015-02-09 NOTE — Progress Notes (Signed)
RT called to assess pt due to RN having difficulty passing suction catheter. RT couldn't pass as well. Pt desat while on vent to 86%. RT bag/lavaged and got back small plugs pink/tan tinged. MD made aware and stated to continue to bag/lavage to try and break plugs up. Pt placed back on the vent Spo2 92% and RN and RT will continue to monitor.

## 2015-02-09 NOTE — Progress Notes (Signed)
ETT advanced to 26 per CCM order (tube was at 20 on arrival) Suction catheter now passes without difficulty. CXR ordered to confirm tube placement.

## 2015-02-09 NOTE — Progress Notes (Signed)
PULMONARY / CRITICAL CARE MEDICINE   Name: Adam Benson MRN: [id number] DOB: [birth year]    ADMISSION DATE:  02/01/2015 CONSULTATION DATE:  02/01/2015  REFERRING MD :  [facility]   CHIEF COMPLAINT:  Severe CAP  INITIAL PRESENTATION: 54 y/o M transferred from [facility] with dyspnea, weight gain, LE edema.  Failed bipap, intubated & transferred to [facility].  Found to  Have PNA with ARDS. ICU course complicated by atrial fibrillation with RVR requiring cardizem/amio gtts, bradycardia, volume overload and poor ventilator synchrony requiring vasopressors.    STUDIES:  9/04  Portable CXR - bilateral opacities. ETT about 6 cm above carina.  SIGNIFICANT EVENTS: 9/04  intubated 9/04  transferred from OSH 9/05  some brady from Cardizem, amio 9/06  lasix started 9/07  fib rvr, neg 4.6 liters 9/08  more rvr, neg 4 liters 9/09  FOB. Purulent secretions. Fever 103  SUBJECTIVE:  RN reports pt remains on amio @ 30, cardizem at 5, fentanyl @ 300, versed @ 8, and levophed at 14 mcg.  Pt more calm/alert this am.  No acute events overnight.  Tmax 99 (improved)  VITAL SIGNS: Temp:  [99.9 F (37.7 C)-101.6 F (38.7 C)] 101.6 F (38.7 C) (09/12 0756) Pulse Rate:  [63-147] 139 (09/12 1000) Resp:  [12-29] 22 (09/12 1000) BP: (92-137)/(42-82) 117/62 mmHg (09/12 1000) SpO2:  [87 %-99 %] 90 % (09/12 1000) FiO2 (%):  [40 %] 40 % (09/12 1000) Weight:  [358 lb (162.388 kg)] 358 lb (162.388 kg) (09/12 0401)   HEMODYNAMICS:     VENTILATOR SETTINGS: Vent Mode:  [-] PRVC FiO2 (%):  [40 %] 40 % Set Rate:  [22 bmp] 22 bmp Vt Set:  [470 mL] 470 mL PEEP:  [5 cmH20] 5 cmH20 Plateau Pressure:  [20 cmH20-23 cmH20] 20 cmH20   INTAKE / OUTPUT:  Intake/Output Summary (Last 24 hours) at 02/09/15 1103 Last data filed at 02/09/15 1015  Gross per 24 hour  Intake 5982.17 ml  Output   3450 ml  Net 2532.17 ml    PHYSICAL EXAMINATION: General: obese adult male on vent, NAD Neuro: calm, opens eyes to  voice, no follow commands, generalized weakness HEENT: OETT, MM pink/moist, unable to assess JVD PULM: even/non-labored, lungs bilaterally with coarse rhonchi CV: s1 s2 irr irr, distant tones GI: NTND, bsx4 active  Extremities: space boots, trace LE edema, generalized edema upper extremities    LABS:  CBC  Recent Labs Lab 02/05/15 0644 02/06/15 0520 02/09/15 0413  WBC 9.0 12.5* 12.8*  HGB 10.8* 11.5* 9.9*  HCT 38.2* 39.5 36.9*  PLT 267 313 378   Coag's  Recent Labs Lab 02/06/15 1158  APTT 30   BMET  Recent Labs Lab 02/07/15 0357 02/08/15 0401 02/09/15 0413  NA 150* 151* 145  K 3.9 3.6 4.2  CL 98* 104 102  CO2 46* 41* 37*  BUN 50* 40* 39*  CREATININE 1.98* 1.63* 1.40*  GLUCOSE 128* 172* 121*   Electrolytes  Recent Labs Lab 02/03/15 0538 02/03/15 1014 02/03/15 2047  02/05/15 1906  02/07/15 0357 02/08/15 0401 02/09/15 0413  CALCIUM 8.4*  --  8.2*  < >  --   < > 8.5* 8.7* 8.3*  MG 2.4 2.5* 2.1  --  2.0  --   --   --   --   PHOS 2.8 2.5 3.8  --   --   --   --   --   --   < > = values in this interval not displayed.  Sepsis Markers No results for input(s): LATICACIDVEN, PROCALCITON, O2SATVEN in the last 168 hours. ABG  Recent Labs Lab 02/05/15 0355 02/06/15 0335 02/07/15 2109  PHART 7.461* 7.395 7.452*  PCO2ART 60.2* 77.6* 66.2*  PO2ART 63.0* 82.5 83.0   Liver Enzymes  Recent Labs Lab 02/04/15 0402 02/05/15 0644  AST 16 20  ALT 11* 16*  ALKPHOS 32* 34*  BILITOT 0.4 0.7  ALBUMIN 2.5* 2.5*   Cardiac Enzymes No results for input(s): TROPONINI, PROBNP in the last 168 hours. Glucose  Recent Labs Lab 02/08/15 0805 02/08/15 1158 02/08/15 1613 02/08/15 2011 02/09/15 0019 02/09/15 0400  GLUCAP 162* 152* 137* 135* 160* 111*    Imaging Dg Chest Port 1 View  02/09/2015   CLINICAL DATA:  Acute respiratory failure.  EXAM: PORTABLE CHEST - 1 VIEW  COMPARISON:  02/08/2015.  02/06/2015.  FINDINGS: Endotracheal tube, NG tube, right IJ  line stable position. Cardiomegaly with pulmonary vascular congestion. Diffuse interstitial prominence consistent congestive heart failure. Bilateral pneumonitis cannot be excluded . No pleural effusion or pneumothorax.  IMPRESSION: 1. Lines and tubes stable position. 2. Persistent unchanged cardiomegaly, pulmonary vascular congestion, and interstitial prominence consistent with congestive heart failure. Bilateral interstitial pneumonitis cannot be excluded.   Electronically Signed   By: Maisie Fus  Register   On: 02/09/2015 07:34     ASSESSMENT / PLAN:  PULMONARY OETT 9/4 >> A: Acute on chronic hypoxic respiratory failure - in the setting of severe CAP / ARDS  Severe community acquired pneumonia Severe ARDS - improved with diuresis  P:   MV support Wean PEEP / FiO2 Trend CXR Daily SBT/WUA once pulmonary status allows PRN ABG  CARDIOVASCULAR R IJ CVL 9/4 >> 9/9 L IJ CVL 9/9 >>  A:  Septic Shock - in setting of CAP Elevated BNP History of atrial fibrillation - 9/7, 9.9 with episodes of RVR Suspected diastolic congestive heart failure P:  Control fevers with tylenol and cooling blanket.  Continue Cardizem / Amiodarone drips  Tele monitoring  ASA 325 mg QD  RENAL A:  Chronic Kidney Disease III ARDS Hypernatremia - with lasix (have maxed effect on ards), slight overdiuresis HypoK  P:   Trend BMP / UOP  Off lasix. Cr is improving. Replace electrolytes as indicated  Free water for hypernatremia.  GASTROINTESTINAL A:   History of PUD -  December 2015 Constipation - BM 9/7 P:    Protonix via tube daily TF to goal Bowel regimen: miralax, senokot.  Hold for diarrhea   HEMATOLOGIC A:   Leukocytosis Hemoconcentration Mild Anemia  P:  Trend CBC SCDs / Heparin for DVT prophylaxis   INFECTIOUS A:   Severe CAP Likley contamination BC Spiking fever, missing organism pcn allergy P:   Urine Leg Ag 9/4 >> pending Urine Strep Ag 9/4 >> negative Respiratory Viral PCR 9/4  >> neg  BCx2 9/4 >>1/2 GPC >> suspected contaminant  UC 9/4 >> Sputum 9/4 >> BCx2 9/6 >>  BCx2 9/9 >>  BAL / FOB 9/9 >> GPC's pairs/clusters >>   Abx: Vancomycin 9/4 >> 9/9 Aztreonam 9/4 >>9 /7 Levaquin 9/4 >> 9/11 Gent 9/7 >> 9/9  Rocephin 9/3 >> 9/3 (given at OSH) Azithromycin 9/3 >> 9/3 (given at OSH)  Linezolid 9/4 >> Primaxin 9/4>>  Still febrile. Will add fungal coverage. Follow final bronch cultures.  ENDOCRINE A:   Hyperglycemia  P:   SSI  NEUROLOGIC A:   Agitated Delirium - in the setting of ARDS/CAP P:   RASS goal: -1 Versed and fentanyl  drip, max Risperdal 1 mg BID Daily WUA   FAMILY  - Updates:  - Inter-disciplinary family meet or Palliative Care meeting due by:  9/11  Ccm time 35 min   Kinnick Maus MD [place] [facility] Pager [phone] If no answer or after 3pm call: [phone] 02/09/2015, 11:03 AM

## 2015-02-09 NOTE — Progress Notes (Addendum)
ANTIBIOTIC CONSULT NOTE - FOLLOW UP  Pharmacy Consult: Primaxin / Zyvox + adding anidulafungin  Indication:  PNA and fever  Allergies  Allergen Reactions  . Penicillins Hives and Shortness Of Breath  . Hydrochlorothiazide Other (See Comments)    hypercalemia    Patient Measurements: Height: 6' (182.9 cm) Weight: (!) 358 lb (162.388 kg) IBW/kg (Calculated) : 77.6 Adjusted Body Weight: 119 kg  Vital Signs: Temp: 101.6 F (38.7 C) (09/12 0756) Temp Source: Oral (09/12 0756) BP: 117/62 mmHg (09/12 1000) Pulse Rate: 139 (09/12 1000) Intake/Output from previous day: 09/11 0701 - 09/12 0700 In: 6866.8 [I.V.:2536.8; NG/GT:3280; IV Piggyback:1050] Out: 3375 [Urine:3375] Intake/Output from this shift: Total I/O In: -  Out: 575 [Urine:575]  Labs:  Recent Labs  02/07/15 0357 02/08/15 0401 02/09/15 0413  WBC  --   --  12.8*  HGB  --   --  9.9*  PLT  --   --  378  CREATININE 1.98* 1.63* 1.40*   Estimated Creatinine Clearance: 98.4 mL/min (by C-G formula based on Cr of 1.4). No results for input(s): VANCOTROUGH, VANCOPEAK, VANCORANDOM, GENTTROUGH, GENTPEAK, GENTRANDOM, TOBRATROUGH, TOBRAPEAK, TOBRARND, AMIKACINPEAK, AMIKACINTROU, AMIKACIN in the last 72 hours.   Microbiology: Recent Results (from the past 720 hour(s))  MRSA PCR Screening     Status: None   Collection Time: 02/01/15  2:05 AM  Result Value Ref Range Status   MRSA by PCR NEGATIVE NEGATIVE Final    Comment:        The GeneXpert MRSA Assay (FDA approved for NASAL specimens only), is one component of a comprehensive MRSA colonization surveillance program. It is not intended to diagnose MRSA infection nor to guide or monitor treatment for MRSA infections.   Culture, respiratory (NON-Expectorated)     Status: None   Collection Time: 02/01/15  4:07 AM  Result Value Ref Range Status   Specimen Description TRACHEAL ASPIRATE  Final   Special Requests Normal  Final   Gram Stain   Final    NO WBC SEEN NO  SQUAMOUS EPITHELIAL CELLS SEEN NO ORGANISMS SEEN Performed at [facility]    Culture   Final    Non-Pathogenic Oropharyngeal-type Flora Isolated. Performed at [facility]    Report Status 02/03/2015 FINAL  Final  Respiratory virus panel     Status: None   Collection Time: 02/01/15  4:09 AM  Result Value Ref Range Status   Source - RVPAN NASAL SWAB  Corrected   Respiratory Syncytial Virus A Negative Negative Final   Respiratory Syncytial Virus B Negative Negative Final   Influenza A Negative Negative Final   Influenza B Negative Negative Final   Parainfluenza 1 Negative Negative Final   Parainfluenza 2 Negative Negative Final   Parainfluenza 3 Negative Negative Final   Metapneumovirus Negative Negative Final   Rhinovirus Negative Negative Final   Adenovirus Negative Negative Final    Comment: (NOTE) Performed At: [facility] [address] [place], [place] [id number] Mila Homer MD EX:[id number]   Culture, blood (routine x 2)     Status: None   Collection Time: 02/01/15  4:30 AM  Result Value Ref Range Status   Specimen Description BLOOD L IJ CVC  Final   Special Requests BOTTLES DRAWN AEROBIC AND ANAEROBIC 10CC  Final   Culture  Setup Time   Final    GRAM POSITIVE COCCI IN CLUSTERS ANAEROBIC BOTTLE ONLY CRITICAL RESULT CALLED TO, READ BACK BY AND VERIFIED WITH: J SOJACK@0133  02/03/15 M KELLY  Culture   Final    STAPHYLOCOCCUS SPECIES (COAGULASE NEGATIVE) THE SIGNIFICANCE OF ISOLATING THIS ORGANISM FROM A SINGLE SET OF BLOOD CULTURES WHEN MULTIPLE SETS ARE DRAWN IS UNCERTAIN. PLEASE NOTIFY THE MICROBIOLOGY DEPARTMENT WITHIN ONE WEEK IF SPECIATION AND SENSITIVITIES ARE REQUIRED.    Report Status 02/05/2015 FINAL  Final  Culture, blood (routine x 2)     Status: None   Collection Time: 02/01/15  4:49 AM  Result Value Ref Range Status   Specimen Description BLOOD LEFT HAND  Final   Special Requests BOTTLES DRAWN AEROBIC AND ANAEROBIC 5CC   Final   Culture NO GROWTH 5 DAYS  Final   Report Status 02/06/2015 FINAL  Final  Culture, blood (routine x 2)     Status: None   Collection Time: 02/03/15 12:21 PM  Result Value Ref Range Status   Specimen Description BLOOD RIGHT HAND  Final   Special Requests BOTTLES DRAWN AEROBIC AND ANAEROBIC 10CC  Final   Culture NO GROWTH 5 DAYS  Final   Report Status 02/08/2015 FINAL  Final  Culture, blood (routine x 2)     Status: None   Collection Time: 02/03/15 12:30 PM  Result Value Ref Range Status   Specimen Description BLOOD LEFT HAND  Final   Special Requests BOTTLES DRAWN AEROBIC AND ANAEROBIC 10CC  Final   Culture NO GROWTH 5 DAYS  Final   Report Status 02/08/2015 FINAL  Final  Culture, respiratory (NON-Expectorated)     Status: None (Preliminary result)   Collection Time: 02/06/15 12:25 PM  Result Value Ref Range Status   Specimen Description TRACHEAL ASPIRATE  Final   Special Requests NONE  Final   Gram Stain   Final    FEW WBC PRESENT,BOTH PMN AND MONONUCLEAR RARE SQUAMOUS EPITHELIAL CELLS PRESENT RARE GRAM POSITIVE COCCI IN PAIRS IN CLUSTERS Performed at [facility]    Culture   Final    Non-Pathogenic Oropharyngeal-type Flora Isolated. Performed at [facility]    Report Status PENDING  Incomplete  Culture, blood (routine x 2)     Status: None (Preliminary result)   Collection Time: 02/06/15  3:30 PM  Result Value Ref Range Status   Specimen Description BLOOD RIGHT ANTECUBITAL  Final   Special Requests BOTTLES DRAWN AEROBIC AND ANAEROBIC 10CC  Final   Culture NO GROWTH 2 DAYS  Final   Report Status PENDING  Incomplete  Culture, blood (routine x 2)     Status: None (Preliminary result)   Collection Time: 02/06/15  3:45 PM  Result Value Ref Range Status   Specimen Description BLOOD RIGHT HAND  Final   Special Requests BOTTLES DRAWN AEROBIC AND ANAEROBIC 10CC  Final   Culture NO GROWTH 2 DAYS  Final   Report Status PENDING  Incomplete    Medical  History: Past Medical History  Diagnosis Date  . Gout   . HTN (hypertension), benign   . Chronic atrial fibrillation   . Hyperlipemia   . Nerve damage     right arm  . Obesity   . Insomnia   . GERD (gastroesophageal reflux disease)   . History of pneumonia 05/2014     two month hosp stay, vent, trach   . 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 Peptic ulcer 04/2014     Assessment: 53 YOM with severe CAP, worsening renal function and returning fever on vancomycin, gentamicin and levofloxacin was switched to Zyvox and Primaxin. He continues to  spike fevers with persistently elevated WBC of 12.8.  Patient has PCN allergy but had an outpatient script for cefdinir filled in January. Pharmacy now consulted to add anidulafungin for empiric coverage   Vanc 9/4 >> 9/9 Azactam 9/4 >> 9/7 LVQ 9/4 >> (9/11) Gent 9/7 >> 9/9 Azith/CTX at [facility] Zyvox 9/9 >> Primaxin 9/9 >> Anidula 9/12>>    9/9 VT = 11 mcg/mL on  q24 >>  q18 (SCr 1.5) 9/7 8-hr gent random post 7 mg/kg dose = 11.9 mcg/mL (level above nomogram) >>  q12  9/4 RVP - negative 9/4 TA - negative 9/4 BCx x2 - CoNS (1 of 2, contaminant) 9/6 BCx x2 - NEGF 9/9 BCx x2 - NGTD 9/9 TA - GPC on Gram stain  Goal of Therapy:  Resolution of infection   Plan:  - Increase Primaxin to  IV Q6H - Zyvox  IV Q12H per MD - Anidulafungin 200 mg IV once followed by 100 mg daily  - Monitor renal fxn, resolution of fever, micro data   Vinnie Level, PharmD., BCPS Clinical Pharmacist Pager [phone]

## 2015-02-09 NOTE — Care Management Note (Signed)
Case Management Note  Patient Details  Name: Adam Benson MRN: [id number] Date of Birth: 1961/04/11  Subjective/Objective:    Continues on vent, cardizem and amio.                  Action/Plan:   Expected Discharge Date:                  Expected Discharge Plan:  Home w Home Health Services  In-House Referral:     Discharge planning Services  CM Consult  Post Acute Care Choice:    Choice offered to:     DME Arranged:    DME Agency:     HH Arranged:    HH Agency:     Status of Service:  In process, will continue to follow  Medicare Important Message Given:    Date Medicare IM Given:    Medicare IM give by:    Date Additional Medicare IM Given:    Additional Medicare Important Message give by:     If discussed at Long Length of Stay Meetings, dates discussed:    Additional Comments:  Vangie Bicker, RN 02/09/2015, 1:33 PM

## 2015-02-10 ENCOUNTER — Inpatient Hospital Stay (HOSPITAL_COMMUNITY): Payer: Medicaid Other

## 2015-02-10 DIAGNOSIS — A0472 Enterocolitis due to Clostridium difficile, not specified as recurrent: Secondary | ICD-10-CM

## 2015-02-10 LAB — CBC
HCT: 35.2 % — ABNORMAL LOW (ref 39.0–52.0)
Hemoglobin: 10 g/dL — ABNORMAL LOW (ref 13.0–17.0)
MCH: 20.9 pg — AB (ref 26.0–34.0)
MCHC: 28.4 g/dL — AB (ref 30.0–36.0)
MCV: 73.5 fL — ABNORMAL LOW (ref 78.0–100.0)
PLATELETS: 372 10*3/uL (ref 150–400)
RBC: 4.79 MIL/uL (ref 4.22–5.81)
RDW: 21.6 % — ABNORMAL HIGH (ref 11.5–15.5)
WBC: 14.9 10*3/uL — ABNORMAL HIGH (ref 4.0–10.5)

## 2015-02-10 LAB — BASIC METABOLIC PANEL
Anion gap: 8 (ref 5–15)
BUN: 32 mg/dL — ABNORMAL HIGH (ref 6–20)
CALCIUM: 8.2 mg/dL — AB (ref 8.9–10.3)
CO2: 33 mmol/L — AB (ref 22–32)
CREATININE: 1.14 mg/dL (ref 0.61–1.24)
Chloride: 101 mmol/L (ref 101–111)
Glucose, Bld: 134 mg/dL — ABNORMAL HIGH (ref 65–99)
Potassium: 3.3 mmol/L — ABNORMAL LOW (ref 3.5–5.1)
Sodium: 142 mmol/L (ref 135–145)

## 2015-02-10 LAB — GLUCOSE, CAPILLARY
GLUCOSE-CAPILLARY: 121 mg/dL — AB (ref 65–99)
GLUCOSE-CAPILLARY: 140 mg/dL — AB (ref 65–99)
GLUCOSE-CAPILLARY: 143 mg/dL — AB (ref 65–99)
GLUCOSE-CAPILLARY: 156 mg/dL — AB (ref 65–99)
Glucose-Capillary: 123 mg/dL — ABNORMAL HIGH (ref 65–99)
Glucose-Capillary: 144 mg/dL — ABNORMAL HIGH (ref 65–99)

## 2015-02-10 LAB — C DIFFICILE QUICK SCREEN W PCR REFLEX
C DIFFICILE (CDIFF) TOXIN: NEGATIVE
C DIFFICLE (CDIFF) ANTIGEN: POSITIVE — AB

## 2015-02-10 MED ORDER — POTASSIUM CHLORIDE 20 MEQ/15ML (10%) PO SOLN
20.0000 meq | ORAL | Status: AC
  Administered 2015-02-10 (×2): 20 meq
  Filled 2015-02-10 (×2): qty 15

## 2015-02-10 MED ORDER — POTASSIUM CHLORIDE 10 MEQ/50ML IV SOLN: 10.0000 meq | INTRAVENOUS | Status: DC

## 2015-02-10 MED ORDER — VANCOMYCIN 50 MG/ML ORAL SOLUTION
125.0000 mg | Freq: Four times a day (QID) | ORAL | Status: DC
  Administered 2015-02-10 – 2015-02-19 (×34): 125 mg via ORAL
  Filled 2015-02-10 (×38): qty 2.5

## 2015-02-10 MED ORDER — FUROSEMIDE 10 MG/ML IJ SOLN
40.0000 mg | Freq: Two times a day (BID) | INTRAMUSCULAR | Status: DC
  Administered 2015-02-10 – 2015-02-11 (×2): 40 mg via INTRAVENOUS
  Filled 2015-02-10 (×5): qty 4

## 2015-02-10 MED ORDER — FAMOTIDINE IN NACL 20-0.9 MG/50ML-% IV SOLN
20.0000 mg | Freq: Two times a day (BID) | INTRAVENOUS | Status: DC
  Administered 2015-02-10 – 2015-02-20 (×21): 20 mg via INTRAVENOUS
  Filled 2015-02-10 (×23): qty 50

## 2015-02-10 MED ORDER — FLORANEX PO PACK
1.0000 g | PACK | Freq: Three times a day (TID) | ORAL | Status: DC
  Administered 2015-02-10 – 2015-02-11 (×4): 1 g via ORAL
  Filled 2015-02-10 (×6): qty 1

## 2015-02-10 NOTE — Progress Notes (Signed)
Nutrition Follow-up  DOCUMENTATION CODES:   Morbid obesity  INTERVENTION:    Continue TF via OGT with Vital High Protein at goal rate of 75 ml/h (1800 ml per day) with Prostat 30 ml TID to provide 2100 kcals, 203 gm protein, 1505 ml free water daily.  Recommend add probiotic to help restore normal gut flora.   NUTRITION DIAGNOSIS:   Inadequate oral intake related to inability to eat as evidenced by NPO status.  Ongoing  GOAL:   Provide needs based on ASPEN/SCCM guidelines  Met  MONITOR:   TF tolerance, Vent status, Labs, Weight trends  REASON FOR ASSESSMENT:   Consult Enteral/tube feeding initiation and management  ASSESSMENT:   Presented to [facility] after a few days of increasing dyspnea, weight gain, & lower extremity edema. Patient initially tried on BiPAP. Required intubation and transferred to [facility] on 9/4.  Discussed patient in ICU rounds and with RN today. Patient with watery stool per RN report. Miralax and senokot are on hold. Stool to be checked for C diff. Discussed adding a probiotic to help restore normal gut bacteria. Labs reviewed: potassium is low.  Patient is currently receiving Vital High Protein via OGT at 75 ml/h (1800 ml/day) with Prostat 30 ml TID to provide 2100 kcals, 203 gm protein, 1505 ml free water daily.   Patient is currently intubated on ventilator support Temp (24hrs), Avg:100 F (37.8 C), Min:99.1 F (37.3 C), Max:101.1 F (38.4 C)   Diet Order:  Diet NPO time specified  Skin:  Reviewed, no issues  Last BM:  9/13 (watery)  Height:   Ht Readings from Last 1 Encounters:  11/18/14 6' (1.829 m)    Weight:   Wt Readings from Last 1 Encounters:  02/10/15 351 lb (159.213 kg)    Ideal Body Weight:  80.9 kg  BMI:  Body mass index is 47.59 kg/(m^2).  Estimated Nutritional Needs:   Kcal:  4920-1007  Protein:  202 gm  Fluid:  2.5 L  EDUCATION NEEDS:   No education needs identified at this time  Molli Barrows, [place], [place], [place] Pager [phone] After Hours Pager [phone]

## 2015-02-10 NOTE — Progress Notes (Signed)
[facility] ADULT ICU REPLACEMENT PROTOCOL FOR AM LAB REPLACEMENT ONLY  The patient does apply for the [facility] Adult ICU Electrolyte Replacment Protocol based on the criteria listed below:   1. Is GFR >/= 40 ml/min? Yes.    Patient's GFR today is >60 2. Is urine output >/= 0.5 ml/kg/hr for the last 6 hours? Yes.   Patient's UOP is .9 ml/kg/hr 3. Is BUN < 60 mg/dL? Yes.    Patient's BUN today is 32 4. Abnormal electrolyte K 3.3 5. Ordered repletion with: per protocol 6. If a panic level lab has been reported, has the CCM MD in charge been notified? Yes.  .   Physician:  Lawerance Sabal [facility] 02/10/2015 3:47 AM

## 2015-02-10 NOTE — Progress Notes (Signed)
Stool positive for C. Difficile reported to eMD

## 2015-02-10 NOTE — Progress Notes (Addendum)
PULMONARY / CRITICAL CARE MEDICINE   Name: Adam Benson MRN: [id number] DOB: 02/25/1961    ADMISSION DATE:  02/01/2015 CONSULTATION DATE:  02/01/2015  REFERRING MD :  [facility]   CHIEF COMPLAINT:  Severe CAP  INITIAL PRESENTATION: 54 y/o M transferred from [facility] with dyspnea, weight gain, LE edema.  Failed bipap, intubated & transferred to [facility].  Found to  Have PNA with ARDS. ICU course complicated by atrial fibrillation with RVR requiring cardizem/amio gtts, bradycardia, volume overload and poor ventilator synchrony requiring vasopressors.    STUDIES:  9/04  Portable CXR - bilateral opacities. ETT about 6 cm above carina.  SIGNIFICANT EVENTS: 9/04  Intubated 9/04  transferred from OSH 9/05  some brady from Cardizem, amio 9/06  lasix started 9/07  fib rvr, neg 4.6 liters 9/08  more rvr, neg 4 liters 9/09  FOB. Purulent secretions. Fever 103  SUBJECTIVE:  Continues to have low grade temps. Improving over 24 hrs. Continue on Amio and cardizem. Having profuse diarrhea. Failed weaning trials.  VITAL SIGNS: Temp:  [99.1 F (37.3 C)-101.1 F (38.4 C)] 99.1 F (37.3 C) (09/13 1148) Pulse Rate:  [67-114] 102 (09/13 1300) Resp:  [15-39] 35 (09/13 1300) BP: (78-143)/(43-103) 119/70 mmHg (09/13 1300) SpO2:  [89 %-100 %] 90 % (09/13 1300) FiO2 (%):  [40 %-50 %] 40 % (09/13 1200) Weight:  [351 lb (159.213 kg)] 351 lb (159.213 kg) (09/13 0401)   HEMODYNAMICS:     VENTILATOR SETTINGS: Vent Mode:  [-] PRVC FiO2 (%):  [40 %-50 %] 40 % Set Rate:  [22 bmp] 22 bmp Vt Set:  [470 mL] 470 mL PEEP:  [5 cmH20] 5 cmH20 Pressure Support:  [5 cmH20-12 cmH20] 10 cmH20 Plateau Pressure:  [12 cmH20-22 cmH20] 22 cmH20   INTAKE / OUTPUT:  Intake/Output Summary (Last 24 hours) at 02/10/15 1338 Last data filed at 02/10/15 1329  Gross per 24 hour  Intake 6561.61 ml  Output   3275 ml  Net 3286.61 ml    PHYSICAL EXAMINATION: General: obese adult male on vent, No  distress Neuro: Awake, follows commands. HEENT: Moist mucus membranes.  PULM: even/non-labored, lungs bilaterally with coarse rhonchi CV: S1 S2, irregualr, distant tones GI: Soft, + BS Extremities: Trace edema.   LABS:  CBC  Recent Labs Lab 02/06/15 0520 02/09/15 0413 02/10/15 0312  WBC 12.5* 12.8* 14.9*  HGB 11.5* 9.9* 10.0*  HCT 39.5 36.9* 35.2*  PLT 313 378 372   Coag's  Recent Labs Lab 02/06/15 1158  APTT 30   BMET  Recent Labs Lab 02/08/15 0401 02/09/15 0413 02/10/15 0312  NA 151* 145 142  K 3.6 4.2 3.3*  CL 104 102 101  CO2 41* 37* 33*  BUN 40* 39* 32*  CREATININE 1.63* 1.40* 1.14  GLUCOSE 172* 121* 134*   Electrolytes  Recent Labs Lab 02/03/15 2047  02/05/15 1906  02/08/15 0401 02/09/15 0413 02/10/15 0312  CALCIUM 8.2*  < >  --   < > 8.7* 8.3* 8.2*  MG 2.1  --  2.0  --   --   --   --   PHOS 3.8  --   --   --   --   --   --   < > = values in this interval not displayed.   Sepsis Markers No results for input(s): LATICACIDVEN, PROCALCITON, O2SATVEN in the last 168 hours. ABG  Recent Labs Lab 02/05/15 0355 02/06/15 0335 02/07/15 2109  PHART 7.461* 7.395 7.452*  PCO2ART 60.2* 77.6* 66.2*  PO2ART 63.0* 82.5 83.0   Liver Enzymes  Recent Labs Lab 02/04/15 0402 02/05/15 0644  AST 16 20  ALT 11* 16*  ALKPHOS 32* 34*  BILITOT 0.4 0.7  ALBUMIN 2.5* 2.5*   Cardiac Enzymes No results for input(s): TROPONINI, PROBNP in the last 168 hours. Glucose  Recent Labs Lab 02/09/15 1546 02/09/15 1952 02/10/15 0013 02/10/15 0349 02/10/15 0803 02/10/15 1147  GLUCAP 146* 126* 143* 121* 140* 123*    Imaging No results found.   ASSESSMENT / PLAN:  PULMONARY OETT 9/4 >> A: Acute on chronic hypoxic respiratory failure - in the setting of severe CAP / ARDS  Severe community acquired pneumonia Severe ARDS - improved with diuresis  P:   MV support Wean PEEP / FiO2 Trend CXR Daily SBT/WUA. PRN ABG  CARDIOVASCULAR R IJ CVL 9/4 >>  9/9 L IJ CVL 9/9 >>  A:  Septic Shock - in setting of CAP Elevated BNP History of atrial fibrillation - 9/7, 9.9 with episodes of RVR Suspected diastolic congestive heart failure P:  Control fevers with tylenol and cooling blanket.  Continue Cardizem / Amiodarone drips  Tele monitoring  ASA 325 mg QD Resume lasix as he has worsening CHF on X ray.  RENAL A:  Chronic Kidney Disease III ARDS Hypernatremia - with lasix (have maxed effect on ards), slight overdiuresis >> resolved. HypoK  P:   Trend BMP / UOP  Off lasix. Cr is improving. Replace electrolytes as indicated    GASTROINTESTINAL A:   History of PUD -  December 2015 Constipation - BM 9/7 P:    Protonix via tube daily TF to goal Bowel regimen: miralax, senokot.  Hold for diarrhea  Start probiotics for diarrhea. Check C diff.  HEMATOLOGIC A:   Leukocytosis Hemoconcentration Mild Anemia  P:  Trend CBC SCDs / Heparin for DVT prophylaxis   INFECTIOUS A:   Severe CAP Likley contamination BC Spiking fever, missing organism pcn allergy P:   Urine Leg Ag 9/4 >> pending Urine Strep Ag 9/4 >> negative Respiratory Viral PCR 9/4 >> neg  BCx2 9/4 >>1/2 GPC >> suspected contaminant  UC 9/4 >> Sputum 9/4 >> BCx2 9/6 >>  BCx2 9/9 >>  BAL / FOB 9/9 >> GPC's pairs/clusters >>   Abx: Vancomycin 9/4 >> 9/9 Aztreonam 9/4 >>9 /7 Levaquin 9/4 >> 9/11 Gent 9/7 >> 9/9  Rocephin 9/3 >> 9/3 (given at OSH) Azithromycin 9/3 >> 9/3 (given at OSH)  Linezolid 9/4 >> Primaxin 9/4>> Anidulafungin 9/12 >>  Still febrile. Follow final bronch cultures.  ENDOCRINE A:   Hyperglycemia  P:   SSI  NEUROLOGIC A:   Agitated Delirium - in the setting of ARDS/CAP P:   RASS goal: -1 Versed and fentanyl drip, max Risperdal 1 mg BID Daily WUA   FAMILY  - Updates: Father updated 9/12. - Inter-disciplinary family meet or Palliative Care meeting due by:  9/11  Ccm time 35 min   Chilton Greathouse MD [place] [facility] Pager [phone] If no answer or after 3pm call: [phone] 02/10/2015, 1:38 PM

## 2015-02-10 NOTE — Progress Notes (Signed)
eLink Physician-Brief Progress Note Patient Name: Idriss Robello DOB: 1960/10/13 MRN: [id number]   Date of Service  02/10/2015  HPI/Events of Note  C DIff pos .  Diarrhea present.  Hosp acquired likely.   eICU Interventions  Enteric isolation.  Start vanco po. D/c ppi , limit ABX      Intervention Category Major Interventions: Sepsis - evaluation and management;Infection - evaluation and management  Shan Levans 02/10/2015, 9:22 PM

## 2015-02-11 ENCOUNTER — Inpatient Hospital Stay (HOSPITAL_COMMUNITY): Payer: Medicaid Other

## 2015-02-11 LAB — CULTURE, BLOOD (ROUTINE X 2)
CULTURE: NO GROWTH
Culture: NO GROWTH

## 2015-02-11 LAB — CBC
HCT: 36.9 % — ABNORMAL LOW (ref 39.0–52.0)
HEMOGLOBIN: 10.4 g/dL — AB (ref 13.0–17.0)
MCH: 21 pg — AB (ref 26.0–34.0)
MCHC: 28.2 g/dL — ABNORMAL LOW (ref 30.0–36.0)
MCV: 74.4 fL — AB (ref 78.0–100.0)
Platelets: 412 10*3/uL — ABNORMAL HIGH (ref 150–400)
RBC: 4.96 MIL/uL (ref 4.22–5.81)
RDW: 22.7 % — ABNORMAL HIGH (ref 11.5–15.5)
WBC: 20.8 10*3/uL — ABNORMAL HIGH (ref 4.0–10.5)

## 2015-02-11 LAB — BASIC METABOLIC PANEL
ANION GAP: 7 (ref 5–15)
BUN: 29 mg/dL — ABNORMAL HIGH (ref 6–20)
CHLORIDE: 103 mmol/L (ref 101–111)
CO2: 33 mmol/L — AB (ref 22–32)
Calcium: 8.2 mg/dL — ABNORMAL LOW (ref 8.9–10.3)
Creatinine, Ser: 0.96 mg/dL (ref 0.61–1.24)
GFR calc non Af Amer: >60 mL/min (ref >60)
Glucose, Bld: 134 mg/dL — ABNORMAL HIGH (ref 65–99)
Potassium: 3.5 mmol/L (ref 3.5–5.1)
Sodium: 143 mmol/L (ref 135–145)

## 2015-02-11 LAB — GLUCOSE, CAPILLARY
GLUCOSE-CAPILLARY: 122 mg/dL — AB (ref 65–99)
GLUCOSE-CAPILLARY: 124 mg/dL — AB (ref 65–99)
Glucose-Capillary: 124 mg/dL — ABNORMAL HIGH (ref 65–99)
Glucose-Capillary: 123 mg/dL — ABNORMAL HIGH (ref 65–99)
Glucose-Capillary: 132 mg/dL — ABNORMAL HIGH (ref 65–99)
Glucose-Capillary: 121 mg/dL — ABNORMAL HIGH (ref 65–99)

## 2015-02-11 LAB — TRIGLYCERIDES: Triglycerides: 86 mg/dL (ref <150)

## 2015-02-11 LAB — MAGNESIUM: Magnesium: 2.3 mg/dL (ref 1.7–2.4)

## 2015-02-11 LAB — PHOSPHORUS: Phosphorus: 4.5 mg/dL (ref 2.5–4.6)

## 2015-02-11 MED ORDER — SODIUM CHLORIDE 0.9 % IV SOLN
500.0000 mg | Freq: Four times a day (QID) | INTRAVENOUS | Status: AC
  Administered 2015-02-11 – 2015-02-14 (×13): 500 mg via INTRAVENOUS
  Filled 2015-02-11 (×13): qty 500

## 2015-02-11 MED ORDER — FUROSEMIDE 10 MG/ML IJ SOLN
60.0000 mg | Freq: Two times a day (BID) | INTRAMUSCULAR | Status: DC
  Administered 2015-02-11 – 2015-02-14 (×6): 60 mg via INTRAVENOUS
  Filled 2015-02-11 (×8): qty 6

## 2015-02-11 MED ORDER — POTASSIUM CHLORIDE 20 MEQ/15ML (10%) PO SOLN
20.0000 meq | ORAL | Status: AC
  Administered 2015-02-11 (×2): 20 meq
  Filled 2015-02-11 (×2): qty 15

## 2015-02-11 MED ORDER — FUROSEMIDE 10 MG/ML IJ SOLN
40.0000 mg | Freq: Once | INTRAMUSCULAR | Status: AC
  Administered 2015-02-11: 40 mg via INTRAVENOUS

## 2015-02-11 MED ORDER — PROPOFOL 1000 MG/100ML IV EMUL
0.0000 ug/kg/min | INTRAVENOUS | Status: DC
  Administered 2015-02-11: 5 ug/kg/min via INTRAVENOUS
  Administered 2015-02-11: 10 ug/kg/min via INTRAVENOUS
  Administered 2015-02-12 (×2): 20 ug/kg/min via INTRAVENOUS
  Administered 2015-02-12 – 2015-02-13 (×2): 15 ug/kg/min via INTRAVENOUS
  Filled 2015-02-11 (×6): qty 100

## 2015-02-11 MED ORDER — METRONIDAZOLE IN NACL 5-0.79 MG/ML-% IV SOLN
500.0000 mg | Freq: Three times a day (TID) | INTRAVENOUS | Status: DC
  Administered 2015-02-11 – 2015-02-18 (×21): 500 mg via INTRAVENOUS
  Filled 2015-02-11 (×23): qty 100

## 2015-02-11 MED ORDER — VITAL HIGH PROTEIN PO LIQD
1000.0000 mL | ORAL | Status: DC
  Administered 2015-02-11 – 2015-03-07 (×28): 1000 mL
  Filled 2015-02-11 (×54): qty 1000

## 2015-02-11 NOTE — Progress Notes (Addendum)
Pt. Work of breathing increased. E link called and MD notified, orders received. Will continue to follow.

## 2015-02-11 NOTE — Progress Notes (Signed)
All IV tubing changed as it was out of date.

## 2015-02-11 NOTE — Progress Notes (Signed)
Spoke with E-link MD E. Deterding about patient's increase in WOB, tachynpea, and increased needs for sedation.  Was advised to continue to increase sedation.  No orders received.  Will continue to monitor and assess.

## 2015-02-11 NOTE — Progress Notes (Signed)
eLink Physician-Brief Progress Note Patient Name: Adam Benson DOB: 03-Jan-1961 MRN: [id number]   Date of Service  02/11/2015  HPI/Events of Note  Severe  dyschrony  eICU Interventions  Add prop     Intervention Category Major Interventions: Hypoxemia - evaluation and management  [name]. 02/11/2015, 4:31 PM

## 2015-02-11 NOTE — Progress Notes (Signed)
Wasted 36mL fentanyl in sink with Sande Brothers, RN.

## 2015-02-11 NOTE — Progress Notes (Signed)
PULMONARY / CRITICAL CARE MEDICINE   Name: Adam Benson MRN: [id number] DOB: 06/26/1960    ADMISSION DATE:  02/01/2015 CONSULTATION DATE:  02/01/2015  REFERRING MD :  [facility]   CHIEF COMPLAINT:  Severe CAP  INITIAL PRESENTATION: 54 y/o M transferred from [facility] with dyspnea, weight gain, LE edema.  Failed bipap, intubated & transferred to [facility].  Found to  Have PNA with ARDS. ICU course complicated by atrial fibrillation with RVR requiring cardizem/amio gtts, bradycardia, volume overload and poor ventilator synchrony requiring vasopressors.    STUDIES:  9/04  Portable CXR - bilateral opacities. ETT about 6 cm above carina.  SIGNIFICANT EVENTS: 9/04  Intubated 9/04  transferred from OSH 9/05  some brady from Cardizem, amio 9/06  lasix started 9/07  fib rvr, neg 4.6 liters 9/08  more rvr, neg 4 liters 9/09  FOB. Purulent secretions. Fever 103  SUBJECTIVE:  Afebrile. Continue on Amio and cardizem. Having profuse diarrhea. Failed weaning trials.  VITAL SIGNS: Temp:  [98.8 F (37.1 C)-99.8 F (37.7 C)] 99.4 F (37.4 C) (09/14 1156) Pulse Rate:  [27-139] 90 (09/14 1016) Resp:  [18-39] 28 (09/14 1016) BP: (105-173)/(44-109) 120/78 mmHg (09/14 1016) SpO2:  [87 %-100 %] 97 % (09/14 1016) FiO2 (%):  [40 %-60 %] 60 % (09/14 1256) Weight:  [350 lb (158.759 kg)] 350 lb (158.759 kg) (09/14 0325)   HEMODYNAMICS:     VENTILATOR SETTINGS: Vent Mode:  [-] PRVC FiO2 (%):  [40 %-60 %] 60 % Set Rate:  [22 bmp] 22 bmp Vt Set:  [470 mL] 470 mL PEEP:  [8 cmH20-10 cmH20] 8 cmH20 Plateau Pressure:  [11 cmH20-20 cmH20] 11 cmH20   INTAKE / OUTPUT:  Intake/Output Summary (Last 24 hours) at 02/11/15 1315 Last data filed at 02/11/15 0900  Gross per 24 hour  Intake 3655.03 ml  Output   3960 ml  Net -304.97 ml    PHYSICAL EXAMINATION: General: obese adult male on vent, No distress Neuro: Awake, follows commands. HEENT: Moist mucus membranes.  PULM: B/L rhonchi. No  wheeze. CV: S1 S2, Irregular GI: Soft, + BS Extremities: 1+ edema  LABS:  CBC  Recent Labs Lab 02/09/15 0413 02/10/15 0312 02/11/15 0326  WBC 12.8* 14.9* 20.8*  HGB 9.9* 10.0* 10.4*  HCT 36.9* 35.2* 36.9*  PLT 378 372 412*   Coag's  Recent Labs Lab 02/06/15 1158  APTT 30   BMET  Recent Labs Lab 02/09/15 0413 02/10/15 0312 02/11/15 0326  NA 145 142 143  K 4.2 3.3* 3.5  CL 102 101 103  CO2 37* 33* 33*  BUN 39* 32* 29*  CREATININE 1.40* 1.14 0.96  GLUCOSE 121* 134* 134*   Electrolytes  Recent Labs Lab 02/05/15 1906  02/09/15 0413 02/10/15 0312 02/11/15 0326  CALCIUM  --   < > 8.3* 8.2* 8.2*  MG 2.0  --   --   --  2.3  PHOS  --   --   --   --  4.5  < > = values in this interval not displayed.   Sepsis Markers No results for input(s): LATICACIDVEN, PROCALCITON, O2SATVEN in the last 168 hours. ABG  Recent Labs Lab 02/05/15 0355 02/06/15 0335 02/07/15 2109  PHART 7.461* 7.395 7.452*  PCO2ART 60.2* 77.6* 66.2*  PO2ART 63.0* 82.5 83.0   Liver Enzymes  Recent Labs Lab 02/05/15 0644  AST 20  ALT 16*  ALKPHOS 34*  BILITOT 0.7  ALBUMIN 2.5*   Cardiac Enzymes No results for input(s): TROPONINI, PROBNP  in the last 168 hours. Glucose  Recent Labs Lab 02/10/15 1626 02/10/15 1944 02/10/15 2349 02/11/15 0355 02/11/15 0809 02/11/15 1156  GLUCAP 144* 156* 124* 124* 123* 132*    Imaging Dg Chest Port 1 View  02/11/2015   CLINICAL DATA:  Acute respiratory failure.  Shortness of breath.  EXAM: PORTABLE CHEST - 1 VIEW  COMPARISON:  None.  FINDINGS: Endotracheal tube, NG tube, right IJ line in stable position. Cardiomegaly. Diffuse dense bilateral pulmonary infiltrates unchanged. No pleural effusion or pneumothorax.  IMPRESSION: 1. Lines and tubes in stable position. 2. Diffuse dense bilateral airspace disease.  No interim change. 3. Persistent unchanged cardiomegaly.   Electronically Signed   By: Maisie Fus  Register   On: 02/11/2015 07:18   Dg  Chest Port 1 View  02/10/2015   CLINICAL DATA:  Ventilator dependent respiratory failure. Worsening oxygen saturation over the past 1-2 hr. Re-evaluate interstitial pulmonary edema and/or ARDS.  EXAM: PORTABLE CHEST - 1 VIEW  COMPARISON:  Portable chest x-rays yesterday dating back to 01/31/2015.  FINDINGS: Endotracheal tube tip in satisfactory position projecting approximately 4-5 cm above the carina. Right jugular central venous catheter tip projects over the lower SVC, unchanged. Nasogastric tube courses below the diaphragm into the stomach.  Cardiac silhouette moderately enlarged, unchanged. Interval worsening of diffuse interstitial and airspace opacities throughout both lungs since yesterday, associated with very low lung volumes. No pneumothorax.  IMPRESSION: 1. Support apparatus satisfactory. Endotracheal tube tip projects approximately 4-5 cm above the carina. 2. Stable cardiomegaly. Interval worsening of interstitial and airspace opacities throughout both lungs, either worsening CHF and/or fluid overload or worsening ARDS.   Electronically Signed   By: Hulan Saas M.D.   On: 02/10/2015 15:21     ASSESSMENT / PLAN:  PULMONARY OETT 9/4 >> A: Acute on chronic hypoxic respiratory failure - in the setting of severe CAP / ARDS  Severe community acquired pneumonia Severe ARDS - improved with diuresis  P:   MV support Wean PEEP / FiO2 Trend CXR Daily SBT/WUA.  CARDIOVASCULAR R IJ CVL 9/4 >> 9/9 L IJ CVL 9/9 >>  A:  Septic Shock - in setting of CAP Elevated BNP History of atrial fibrillation - 9/7, 9.9 with episodes of RVR Suspected diastolic congestive heart failure P:  Control fevers with tylenol and cooling blanket.  Continue Cardizem. D/C amiodarone. Tele monitoring  ASA 325 mg QD Lasix for diuresis.   RENAL A:  Chronic Kidney Disease III ARDS HypoK  P:   Trend BMP / UOP  Replace electrolytes as indicated.  GASTROINTESTINAL A:   History of PUD -  December  2015 Constipation - BM 9/7 P:    Protonix via tube daily TF to goal Bowel regimen: miralax, senokot.  Hold for diarrhea   HEMATOLOGIC A:   Leukocytosis Hemoconcentration Mild Anemia  P:  Trend CBC SCDs / Heparin for DVT prophylaxis   INFECTIOUS A:   Severe CAP Likley contamination BC Spiking fever, missing organism pcn allergy P:   Urine Leg Ag 9/4 >> pending Urine Strep Ag 9/4 >> negative Respiratory Viral PCR 9/4 >> neg  BCx2 9/4 >>1/2 GPC >> suspected contaminant  UC 9/4 >> Sputum 9/4 >> BCx2 9/6 >>  BCx2 9/9 >>  BAL / FOB 9/9 >> GPC's pairs/clusters >>   Abx: Vancomycin 9/4 >> 9/9 Aztreonam 9/4 >>9 /7 Levaquin 9/4 >> 9/11 Gent 9/7 >> 9/9  Rocephin 9/3 >> 9/3 (given at OSH) Azithromycin 9/3 >> 9/3 (given at OSH)  Linezolid 9/4 >>  Primaxin 9/4>>9/14 Anidulafungin 9/12 >> Flagyl 9/14>> PO Vanco 9/14  Start Flagyl and PO Vanco for C diff. Even though the toxin is negative the clinical suspicion for diarrhea is high.  ENDOCRINE A:   Hyperglycemia  P:   SSI  NEUROLOGIC A:   Agitated Delirium - in the setting of ARDS/CAP P:   RASS goal: -1 Versed and fentanyl drip, max Risperdal 1 mg BID Daily WUA   FAMILY  - Updates: Father updated 9/12. - Inter-disciplinary family meet or Palliative Care meeting due by:  9/11  Ccm time 35 min   Chilton Greathouse MD [place] [facility] Pager [phone] If no answer or after 3pm call: [phone] 02/11/2015, 1:15 PM

## 2015-02-11 NOTE — Progress Notes (Signed)
[facility] ADULT ICU REPLACEMENT PROTOCOL FOR AM LAB REPLACEMENT ONLY  The patient does apply for the [facility] Adult ICU Electrolyte Replacment Protocol based on the criteria listed below:   1. Is GFR >/= 40 ml/min? Yes.    Patient's GFR today is >60 2. Is urine output >/= 0.5 ml/kg/hr for the last 6 hours? Yes.   Patient's UOP is 0.5 ml/kg/hr 3. Is BUN < 60 mg/dL? Yes.    Patient's BUN today is 29 4. Abnormal electrolyte( K 3.5 5. Ordered repletion with: Per protocol 6. If a panic level lab has been reported, has the CCM MD in charge been notified? Yes.  .   Physician:  E Deterding  [place] 02/11/2015 4:22 AM

## 2015-02-12 ENCOUNTER — Inpatient Hospital Stay (HOSPITAL_COMMUNITY): Payer: Medicaid Other

## 2015-02-12 LAB — COMPREHENSIVE METABOLIC PANEL
ALBUMIN: 2.3 g/dL — AB (ref 3.5–5.0)
ALK PHOS: 36 U/L — AB (ref 38–126)
ALT: 16 U/L — AB (ref 17–63)
ANION GAP: 6 (ref 5–15)
AST: 20 U/L (ref 15–41)
BUN: 41 mg/dL — ABNORMAL HIGH (ref 6–20)
CALCIUM: 8.2 mg/dL — AB (ref 8.9–10.3)
CHLORIDE: 105 mmol/L (ref 101–111)
CO2: 35 mmol/L — AB (ref 22–32)
Creatinine, Ser: 1.19 mg/dL (ref 0.61–1.24)
GFR calc Af Amer: >60 mL/min (ref >60)
GFR calc non Af Amer: >60 mL/min (ref >60)
GLUCOSE: 120 mg/dL — AB (ref 65–99)
Potassium: 3.9 mmol/L (ref 3.5–5.1)
SODIUM: 146 mmol/L — AB (ref 135–145)
Total Bilirubin: 0.4 mg/dL (ref 0.3–1.2)
Total Protein: 4.8 g/dL — ABNORMAL LOW (ref 6.5–8.1)

## 2015-02-12 LAB — GLUCOSE, CAPILLARY
GLUCOSE-CAPILLARY: 125 mg/dL — AB (ref 65–99)
GLUCOSE-CAPILLARY: 105 mg/dL — AB (ref 65–99)
GLUCOSE-CAPILLARY: 101 mg/dL — AB (ref 65–99)
Glucose-Capillary: 112 mg/dL — ABNORMAL HIGH (ref 65–99)
Glucose-Capillary: 126 mg/dL — ABNORMAL HIGH (ref 65–99)
Glucose-Capillary: 88 mg/dL (ref 65–99)

## 2015-02-12 LAB — CBC
HCT: 31.6 % — ABNORMAL LOW (ref 39.0–52.0)
HEMOGLOBIN: 8.6 g/dL — AB (ref 13.0–17.0)
MCH: 20.9 pg — AB (ref 26.0–34.0)
MCHC: 27.2 g/dL — ABNORMAL LOW (ref 30.0–36.0)
MCV: 76.7 fL — AB (ref 78.0–100.0)
Platelets: 391 10*3/uL (ref 150–400)
RBC: 4.12 MIL/uL — AB (ref 4.22–5.81)
RDW: 23 % — ABNORMAL HIGH (ref 11.5–15.5)
WBC: 16.3 10*3/uL — AB (ref 4.0–10.5)

## 2015-02-12 LAB — MAGNESIUM: MAGNESIUM: 2.3 mg/dL (ref 1.7–2.4)

## 2015-02-12 LAB — PROCALCITONIN: PROCALCITONIN: 0.19 ng/mL

## 2015-02-12 LAB — PHOSPHORUS: Phosphorus: 5.5 mg/dL — ABNORMAL HIGH (ref 2.5–4.6)

## 2015-02-12 MED ORDER — DILTIAZEM 12 MG/ML ORAL SUSPENSION
60.0000 mg | Freq: Four times a day (QID) | ORAL | Status: DC
  Administered 2015-02-12 – 2015-02-17 (×21): 60 mg via ORAL
  Filled 2015-02-12 (×24): qty 6

## 2015-02-12 NOTE — Progress Notes (Signed)
Pt had thick, tan mucus coming out of nose. Gastric residual checked- total of 20cc- dark brown in color. When residual was re-fed, pt subglottic was suctioned and contents were of same appearance as gastric residual. Inline suction contents were thick, tan mucous.  This was communicated to the MD who instructed me to give the oral vanc and cardizem per tube but to hold tube feeds.

## 2015-02-12 NOTE — Progress Notes (Signed)
Tube feeding paused 0830, as patient regurgitated small amount of tube feed. 0800 assessment auscultation revealed correct tube placement; per chest x-ray tube was stable. 0800 gastric residual 85. 7026 gastric residual 18. Tube feeds and per tube meds held. Will recheck residual in 1 hour.

## 2015-02-12 NOTE — Progress Notes (Signed)
Gastric Residual rechecked at 1218 and were 0. Dietary was consulted and agreed to restarting tube feed and per tube meds.

## 2015-02-12 NOTE — Progress Notes (Signed)
Gastric Residual rechecked at 1030. 30 ml noted. Will continue to hold per tube meds and tube feeds. Will recheck residual in 1 hr.

## 2015-02-12 NOTE — Progress Notes (Signed)
PULMONARY / CRITICAL CARE MEDICINE   Name: Adam Benson MRN: [id number] DOB: 21-Sep-1960    ADMISSION DATE:  02/01/2015 CONSULTATION DATE:  02/01/2015  REFERRING MD :  [facility]   CHIEF COMPLAINT:  Severe CAP  INITIAL PRESENTATION: 54 y/o M transferred from [facility] with dyspnea, weight gain, LE edema.  Failed bipap, intubated & transferred to [facility].  Found to have PNA with ARDS. ICU course complicated by atrial fibrillation with RVR requiring cardizem/amio gtts, bradycardia, volume overload and poor ventilator synchrony requiring vasopressors.    STUDIES:  9/04  Portable CXR - bilateral opacities. ETT about 6 cm above carina.  SIGNIFICANT EVENTS: 9/04  Intubated 9/04  transferred from OSH 9/05  some brady from Cardizem, amio 9/06  lasix started 9/07  fib rvr, neg 4.6 liters 9/08  more rvr, neg 4 liters 9/09  FOB. Purulent secretions. Fever 103  SUBJECTIVE:  Afebrile. Dysynchronus with vent. Added propofol. Still having profuse diarrhea.   VITAL SIGNS: Temp:  [98.5 F (36.9 C)-99.4 F (37.4 C)] 98.5 F (36.9 C) (09/15 0829) Pulse Rate:  [35-110] 81 (09/15 0800) Resp:  [12-35] 22 (09/15 0800) BP: (72-183)/(29-122) 94/59 mmHg (09/15 0800) SpO2:  [91 %-100 %] 98 % (09/15 0800) FiO2 (%):  [50 %-60 %] 60 % (09/15 0800) Weight:  [370 lb (167.831 kg)] 370 lb (167.831 kg) (09/15 0400)   HEMODYNAMICS:     VENTILATOR SETTINGS: Vent Mode:  [-] PRVC FiO2 (%):  [50 %-60 %] 60 % Set Rate:  [22 bmp] 22 bmp Vt Set:  [470 mL] 470 mL PEEP:  [8 cmH20] 8 cmH20 Plateau Pressure:  [21 cmH20-37 cmH20] 37 cmH20   INTAKE / OUTPUT:  Intake/Output Summary (Last 24 hours) at 02/12/15 1002 Last data filed at 02/12/15 0900  Gross per 24 hour  Intake 5095.02 ml  Output   4525 ml  Net 570.02 ml    PHYSICAL EXAMINATION: General: obese adult male on vent, No distress Neuro: Sedated, unresponsive. HEENT: Moist mucus membranes.  PULM: B/L rhonchi. No wheeze. CV: S1 S2,  Irregular GI: Soft, + BS Extremities: 1+ edema  LABS:  CBC  Recent Labs Lab 02/10/15 0312 02/11/15 0326 02/12/15 0411  WBC 14.9* 20.8* 16.3*  HGB 10.0* 10.4* 8.6*  HCT 35.2* 36.9* 31.6*  PLT 372 412* 391   Coag's  Recent Labs Lab 02/06/15 1158  APTT 30   BMET  Recent Labs Lab 02/10/15 0312 02/11/15 0326 02/12/15 0411  NA 142 143 146*  K 3.3* 3.5 3.9  CL 101 103 105  CO2 33* 33* 35*  BUN 32* 29* 41*  CREATININE 1.14 0.96 1.19  GLUCOSE 134* 134* 120*   Electrolytes  Recent Labs Lab 02/05/15 1906  02/10/15 0312 02/11/15 0326 02/12/15 0411  CALCIUM  --   < > 8.2* 8.2* 8.2*  MG 2.0  --   --  2.3 2.3  PHOS  --   --   --  4.5 5.5*  < > = values in this interval not displayed.   Sepsis Markers  Recent Labs Lab 02/12/15 0411  PROCALCITON 0.19   ABG  Recent Labs Lab 02/06/15 0335 02/07/15 2109  PHART 7.395 7.452*  PCO2ART 77.6* 66.2*  PO2ART 82.5 83.0   Liver Enzymes  Recent Labs Lab 02/12/15 0411  AST 20  ALT 16*  ALKPHOS 36*  BILITOT 0.4  ALBUMIN 2.3*   Cardiac Enzymes No results for input(s): TROPONINI, PROBNP in the last 168 hours. Glucose  Recent Labs Lab 02/11/15 1156 02/11/15 1538  02/11/15 1944 02/11/15 2358 02/12/15 0351 02/12/15 0759  GLUCAP 132* 122* 121* 126* 112* 125*    Imaging Dg Chest Port 1 View  02/12/2015   CLINICAL DATA:  Respiratory failure.  Shortness of breath .  EXAM: PORTABLE CHEST - 1 VIEW  COMPARISON:  02/11/2015.  FINDINGS: Endotracheal tube, NG tube, right IJ line stable position. Stable cardiomegaly. Diffuse bilateral dense airspace disease. No interim change. No pleural effusion or pneumothorax.  IMPRESSION: 1. Lines and tubes in stable position. 2. Diffuse dense bilateral airspace disease.  No interim change. 3. Stable cardiomegaly.   Electronically Signed   By: Maisie Fus  Register   On: 02/12/2015 07:33     ASSESSMENT / PLAN:  PULMONARY OETT 9/4 >> A: Acute on chronic hypoxic respiratory  failure - in the setting of severe CAP / ARDS  Severe community acquired pneumonia Severe ARDS - improved with diuresis  P:   MV support Trend CXR Failing SBTs Will likely need a trach next week.   CARDIOVASCULAR R IJ CVL 9/4 >> 9/9 L IJ CVL 9/9 >>  A:  Septic Shock - in setting of CAP Elevated BNP History of atrial fibrillation - 9/7, 9.9 with episodes of RVR Suspected diastolic congestive heart failure P:  Control fevers with tylenol and cooling blanket.  Continue Cardizem. Convert to PO. D/C amiodarone. Tele monitoring  ASA 325 mg QD Lasix for diuresis.   RENAL A:  Chronic Kidney Disease III ARDS HypoK  P:   Trend BMP / UOP  Replace electrolytes as indicated.  GASTROINTESTINAL A:   History of PUD -  December 2015 Constipation - BM 9/7 P:    Protonix via tube daily TF on hold due to high residuals. Check Abd x ray.  Bowel regimen: miralax, senokot.  Hold for diarrhea   HEMATOLOGIC A:   Leukocytosis Hemoconcentration Mild Anemia  P:  Trend CBC SCDs / Heparin for DVT prophylaxis   INFECTIOUS A:   Severe CAP Likley contamination BC Spiking fever, missing organism pcn allergy P:   Urine Leg Ag 9/4 >> pending Urine Strep Ag 9/4 >> negative Respiratory Viral PCR 9/4 >> neg  BCx2 9/4 >>1/2 GPC >> suspected contaminant  UC 9/4 >> Sputum 9/4 >> BCx2 9/6 >>  BCx2 9/9 >>  BAL / FOB 9/9 >> GPC's pairs/clusters >>   Abx: Vancomycin 9/4 >> 9/9 Aztreonam 9/4 >>9 /7 Levaquin 9/4 >> 9/11 Gent 9/7 >> 9/9  Rocephin 9/3 >> 9/3 (given at OSH) Azithromycin 9/3 >> 9/3 (given at OSH)  Linezolid 9/4 >> Primaxin 9/4>> Anidulafungin 9/12 >> Flagyl 9/14>> PO Vanco 9/14  On Flagyl and PO Vanco for C diff. Even though the toxin is negative the clinical suspicion for diarrhea is high.  ENDOCRINE A:   Hyperglycemia  P:   SSI  NEUROLOGIC A:   Agitated Delirium - in the setting of ARDS/CAP P:   RASS goal: -1 Versed and fentanyl drip, max Risperdal 1  mg BID Daily WUA   FAMILY  - Updates: Father updated 9/15. - Inter-disciplinary family meet or Palliative Care meeting due by:  9/11  Ccm time 35 min   Chilton Greathouse MD [place] [facility] Pager [phone] If no answer or after 3pm call: [phone] 02/12/2015, 10:02 AM

## 2015-02-12 NOTE — Progress Notes (Signed)
Wasted 26mL versed in sink with Sande Brothers, RN at [phone]

## 2015-02-13 ENCOUNTER — Encounter (HOSPITAL_COMMUNITY): Payer: Self-pay | Admitting: *Deleted

## 2015-02-13 LAB — BASIC METABOLIC PANEL
Anion gap: 8 (ref 5–15)
BUN: 44 mg/dL — AB (ref 6–20)
CALCIUM: 8.6 mg/dL — AB (ref 8.9–10.3)
CHLORIDE: 107 mmol/L (ref 101–111)
CO2: 36 mmol/L — ABNORMAL HIGH (ref 22–32)
CREATININE: 1.19 mg/dL (ref 0.61–1.24)
GFR calc Af Amer: >60 mL/min (ref >60)
GFR calc non Af Amer: >60 mL/min (ref >60)
Glucose, Bld: 109 mg/dL — ABNORMAL HIGH (ref 65–99)
Potassium: 4.2 mmol/L (ref 3.5–5.1)
SODIUM: 151 mmol/L — AB (ref 135–145)

## 2015-02-13 LAB — CBC
HEMATOCRIT: 31.5 % — AB (ref 39.0–52.0)
Hemoglobin: 8.7 g/dL — ABNORMAL LOW (ref 13.0–17.0)
MCH: 21.3 pg — ABNORMAL LOW (ref 26.0–34.0)
MCHC: 27.6 g/dL — AB (ref 30.0–36.0)
MCV: 77.2 fL — ABNORMAL LOW (ref 78.0–100.0)
Platelets: 436 10*3/uL — ABNORMAL HIGH (ref 150–400)
RBC: 4.08 MIL/uL — ABNORMAL LOW (ref 4.22–5.81)
RDW: 23 % — AB (ref 11.5–15.5)
WBC: 14.1 10*3/uL — ABNORMAL HIGH (ref 4.0–10.5)

## 2015-02-13 LAB — BLOOD GAS, ARTERIAL
ACID-BASE EXCESS: 10.4 mmol/L — AB (ref 0.0–2.0)
BICARBONATE: 36.7 meq/L — AB (ref 20.0–24.0)
Drawn by: 30136
FIO2: 0.6
LHR: 22 {breaths}/min
O2 Saturation: 88.6 %
PATIENT TEMPERATURE: 98.6
PEEP: 8 cmH2O
TCO2: 39 mmol/L (ref 0–100)
VT: 470 mL
pCO2 arterial: 74.6 mmHg (ref 35.0–45.0)
pH, Arterial: 7.313 — ABNORMAL LOW (ref 7.350–7.450)
pO2, Arterial: 63.8 mmHg — ABNORMAL LOW (ref 80.0–100.0)

## 2015-02-13 LAB — GLUCOSE, CAPILLARY
GLUCOSE-CAPILLARY: 93 mg/dL (ref 65–99)
GLUCOSE-CAPILLARY: 135 mg/dL — AB (ref 65–99)
Glucose-Capillary: 106 mg/dL — ABNORMAL HIGH (ref 65–99)
Glucose-Capillary: 110 mg/dL — ABNORMAL HIGH (ref 65–99)
Glucose-Capillary: 118 mg/dL — ABNORMAL HIGH (ref 65–99)
Glucose-Capillary: 132 mg/dL — ABNORMAL HIGH (ref 65–99)

## 2015-02-13 LAB — PROCALCITONIN: PROCALCITONIN: 0.16 ng/mL

## 2015-02-13 NOTE — Progress Notes (Signed)
Patient was bronched bedside with no apparent complications. RN was at bedside with RT and MD. RT will continue to monitor.

## 2015-02-13 NOTE — Progress Notes (Signed)
CRITICAL VALUE ALERT  Critical value received:  Ph= 7.31, pC02= 74.6, pO2= 63.8, HCO2= 36.7  Date of notification:  02/13/15  Time of notification:  0845  Critical value read back:Yes.    Nurse who received alert:  Tory Emerald, RN  MD notified (1st page): Dr Isaiah Serge  Time of first page:  [phone]  MD notified (2nd page):  Time of second page:  Responding MD:  Dr Isaiah Serge  Time MD responded:  [phone]   No new orders at this time. RT also aware.

## 2015-02-13 NOTE — Progress Notes (Addendum)
PULMONARY / CRITICAL CARE MEDICINE   Name: Adam Benson MRN: [id number] DOB: 1960-06-20    ADMISSION DATE:  02/01/2015 CONSULTATION DATE:  02/01/2015  REFERRING MD :  [facility]   CHIEF COMPLAINT:  Severe CAP  INITIAL PRESENTATION: 54 y/o M transferred from [facility] with dyspnea, weight gain, LE edema.  Failed bipap, intubated & transferred to [facility].  Found to have PNA with ARDS. ICU course complicated by atrial fibrillation with RVR requiring cardizem/amio gtts, bradycardia, volume overload and poor ventilator synchrony requiring vasopressors.    STUDIES:  9/04  Portable CXR - bilateral opacities. ETT about 6 cm above carina.  SIGNIFICANT EVENTS: 9/04  Intubated 9/04  transferred from OSH 9/05  some brady from Cardizem, amio 9/06  lasix started 9/07  fib rvr, neg 4.6 liters 9/08  more rvr, neg 4 liters 9/09  FOB. Purulent secretions. Fever 103 9/16 Repeat FOB. Purulent secretions lavaged and cleaned out.   SUBJECTIVE:  Still having profuse diarrhea.   VITAL SIGNS: Temp:  [98.7 F (37.1 C)-99.2 F (37.3 C)] 98.7 F (37.1 C) (09/16 1154) Pulse Rate:  [77-136] 103 (09/16 1400) Resp:  [18-31] 28 (09/16 1400) BP: (86-142)/(45-94) 106/60 mmHg (09/16 1400) SpO2:  [92 %-100 %] 100 % (09/16 1400) FiO2 (%):  [60 %] 60 % (09/16 1200) Weight:  [374 lb (169.645 kg)] 374 lb (169.645 kg) (09/16 0313)   HEMODYNAMICS:    VENTILATOR SETTINGS: Vent Mode:  [-] PRVC FiO2 (%):  [60 %] 60 % Set Rate:  [22 bmp-26 bmp] 26 bmp Vt Set:  [470 mL] 470 mL PEEP:  [8 cmH20] 8 cmH20 Plateau Pressure:  [19 cmH20-38 cmH20] 38 cmH20   INTAKE / OUTPUT:  Intake/Output Summary (Last 24 hours) at 02/13/15 1428 Last data filed at 02/13/15 1400  Gross per 24 hour  Intake 3211.54 ml  Output   4700 ml  Net -1488.46 ml    PHYSICAL EXAMINATION: General: obese adult male on vent, No distress Neuro: Sedated, unresponsive. HEENT: Moist mucus membranes.  PULM: B/L rhonchi. No wheeze. CV: S1  S2, Irregular GI: Soft, + BS Extremities: 1+ edema  LABS:  CBC  Recent Labs Lab 02/11/15 0326 02/12/15 0411 02/13/15 0439  WBC 20.8* 16.3* 14.1*  HGB 10.4* 8.6* 8.7*  HCT 36.9* 31.6* 31.5*  PLT 412* 391 436*   Coag's No results for input(s): APTT, INR in the last 168 hours. BMET  Recent Labs Lab 02/11/15 0326 02/12/15 0411 02/13/15 0439  NA 143 146* 151*  K 3.5 3.9 4.2  CL 103 105 107  CO2 33* 35* 36*  BUN 29* 41* 44*  CREATININE 0.96 1.19 1.19  GLUCOSE 134* 120* 109*   Electrolytes  Recent Labs Lab 02/11/15 0326 02/12/15 0411 02/13/15 0439  CALCIUM 8.2* 8.2* 8.6*  MG 2.3 2.3  --   PHOS 4.5 5.5*  --      Sepsis Markers  Recent Labs Lab 02/12/15 0411 02/13/15 0439  PROCALCITON 0.19 0.16   ABG  Recent Labs Lab 02/07/15 2109 02/13/15 0830  PHART 7.452* 7.313*  PCO2ART 66.2* 74.6*  PO2ART 83.0 63.8*   Liver Enzymes  Recent Labs Lab 02/12/15 0411  AST 20  ALT 16*  ALKPHOS 36*  BILITOT 0.4  ALBUMIN 2.3*   Cardiac Enzymes No results for input(s): TROPONINI, PROBNP in the last 168 hours. Glucose  Recent Labs Lab 02/12/15 1550 02/12/15 1949 02/13/15 0003 02/13/15 0410 02/13/15 0807 02/13/15 1152  GLUCAP 101* 88 118* 106* 93 110*    Imaging No results  found.   ASSESSMENT / PLAN:  PULMONARY OETT 9/4 >> A: Acute on chronic hypoxic respiratory failure - in the setting of severe CAP / ARDS  Severe community acquired pneumonia Severe ARDS - improved with diuresis  P:   MV support Trend CXR Failing SBTs Will likely need a trach next week.  Increase RR to 26 to help with hypercapnia. S/p second FOB with cleanout of purulent secerentios. Follow cultures  CARDIOVASCULAR R IJ CVL 9/4 >> 9/9 L IJ CVL 9/9 >>  A:  Septic Shock - in setting of CAP Elevated BNP History of atrial fibrillation - 9/7, 9.9 with episodes of RVR Suspected diastolic congestive heart failure P:  Control fevers with tylenol and cooling blanket.   Continue Cardizem. Convert to PO. D/C amiodarone. Tele monitoring  ASA 325 mg QD Lasix for diuresis.   RENAL A:  Chronic Kidney Disease III ARDS HypoK  P:   Trend BMP / UOP  Replace electrolytes as indicated.  GASTROINTESTINAL A:   History of PUD -  December 2015 Constipation - BM 9/7 P:    Protonix via tube daily TF was on hold due to high residuals. Will attempt to reinitiate.  Bowel regimen: miralax, senokot.  Hold for diarrhea   HEMATOLOGIC A:   Leukocytosis Hemoconcentration Mild Anemia  P:  Trend CBC SCDs / Heparin for DVT prophylaxis   INFECTIOUS A:   Severe CAP Likley contamination BC Spiking fever, missing organism pcn allergy P:   Urine Leg Ag 9/4 >> pending Urine Strep Ag 9/4 >> negative Respiratory Viral PCR 9/4 >> neg  BCx2 9/4 >>1/2 GPC >> suspected contaminant  UC 9/4 >> Sputum 9/4 >> BCx2 9/6 >>  BCx2 9/9 >>  BAL / FOB 9/9 >> GPC's pairs/clusters >>   Abx: Vancomycin 9/4 >> 9/9 Aztreonam 9/4 >>9 /7 Levaquin 9/4 >> 9/11 Gent 9/7 >> 9/9  Rocephin 9/3 >> 9/3 (given at OSH) Azithromycin 9/3 >> 9/3 (given at OSH)  Linezolid 9/4 >> Primaxin 9/4>> Anidulafungin 9/12 >> Flagyl 9/14>> PO Vanco 9/14  On Flagyl and PO Vanco for C diff. Even though the toxin is negative the clinical suspicion for diarrhea is high. WBC count is improving after C diff treatment was started.   ENDOCRINE A:   Hyperglycemia  P:   SSI  NEUROLOGIC A:   Agitated Delirium - in the setting of ARDS/CAP P:   RASS goal: -1 Versed and fentanyl drip at high doses   FAMILY  - Updates: Father updated 9/15. - Inter-disciplinary family meet or Palliative Care meeting due by:  9/11  Ccm time 35 min   Chilton Greathouse MD [facility] Pager [phone] If no answer or after 3pm call: [phone] 02/13/2015, 2:28 PM

## 2015-02-13 NOTE — Procedures (Signed)
PCCM Bronchoscopy Procedure Note  The patient was informed of the risks (including but not limited to bleeding, infection, respiratory failure, lung injury, tooth/oral injury) and benefits of the procedure and gave consent, see chart.  Indication: Respiratory failure, Thick secretions.  Post Procedure Diagnosis: Pneumonia  Condition pre procedure: Stable  Procedure description: The bronchoscope was introduced through the endotracheal tube and passed to the bilateral lungs to the level of the subsegmental bronchi throughout the tracheobronchial tree.  Airway exam revealed thick purulent secretions, esp in the bilateral lower lobes.  Pt was lavaged with normal saline and suctioned and to clean out the secretions. Pt had transient desats.  Procedures performed: Bronchial lavage and washings.   Specimens sent: Cultures  Condition post procedure: Stable.  EBL: 0  Complications: None.  Adam Greathouse MD [facility] Pager [phone] If no answer or after 3pm call: [phone] 02/13/2015, 2:27 PM

## 2015-02-14 ENCOUNTER — Inpatient Hospital Stay (HOSPITAL_COMMUNITY): Payer: Medicaid Other

## 2015-02-14 DIAGNOSIS — J9611 Chronic respiratory failure with hypoxia: Secondary | ICD-10-CM

## 2015-02-14 DIAGNOSIS — N183 Chronic kidney disease, stage 3 (moderate): Secondary | ICD-10-CM

## 2015-02-14 LAB — BLOOD GAS, ARTERIAL
ACID-BASE EXCESS: 14.1 mmol/L — AB (ref 0.0–2.0)
BICARBONATE: 39.9 meq/L — AB (ref 20.0–24.0)
DRAWN BY: 345601
FIO2: 0.6
O2 SAT: 98 %
PEEP/CPAP: 8 cmH2O
PH ART: 7.373 (ref 7.350–7.450)
Patient temperature: 98.6
RATE: 26 resp/min
TCO2: 42.1 mmol/L (ref 0–100)
VT: 470 mL
pCO2 arterial: 70.4 mmHg (ref 35.0–45.0)
pO2, Arterial: 115 mmHg — ABNORMAL HIGH (ref 80.0–100.0)

## 2015-02-14 LAB — CBC
HCT: 33.5 % — ABNORMAL LOW (ref 39.0–52.0)
HEMOGLOBIN: 9 g/dL — AB (ref 13.0–17.0)
MCH: 20.7 pg — AB (ref 26.0–34.0)
MCHC: 26.9 g/dL — ABNORMAL LOW (ref 30.0–36.0)
MCV: 77.2 fL — ABNORMAL LOW (ref 78.0–100.0)
Platelets: 533 10*3/uL — ABNORMAL HIGH (ref 150–400)
RBC: 4.34 MIL/uL (ref 4.22–5.81)
RDW: 23.3 % — ABNORMAL HIGH (ref 11.5–15.5)
WBC: 12.8 10*3/uL — ABNORMAL HIGH (ref 4.0–10.5)

## 2015-02-14 LAB — BASIC METABOLIC PANEL
ANION GAP: 6 (ref 5–15)
BUN: 39 mg/dL — ABNORMAL HIGH (ref 6–20)
CALCIUM: 8.7 mg/dL — AB (ref 8.9–10.3)
CO2: 39 mmol/L — AB (ref 22–32)
Chloride: 109 mmol/L (ref 101–111)
Creatinine, Ser: 0.84 mg/dL (ref 0.61–1.24)
GFR calc non Af Amer: >60 mL/min (ref >60)
Glucose, Bld: 128 mg/dL — ABNORMAL HIGH (ref 65–99)
POTASSIUM: 3.6 mmol/L (ref 3.5–5.1)
Sodium: 154 mmol/L — ABNORMAL HIGH (ref 135–145)

## 2015-02-14 LAB — GLUCOSE, CAPILLARY
GLUCOSE-CAPILLARY: 117 mg/dL — AB (ref 65–99)
Glucose-Capillary: 105 mg/dL — ABNORMAL HIGH (ref 65–99)
Glucose-Capillary: 111 mg/dL — ABNORMAL HIGH (ref 65–99)
Glucose-Capillary: 136 mg/dL — ABNORMAL HIGH (ref 65–99)
Glucose-Capillary: 105 mg/dL — ABNORMAL HIGH (ref 65–99)

## 2015-02-14 LAB — PHOSPHORUS: Phosphorus: 2.7 mg/dL (ref 2.5–4.6)

## 2015-02-14 LAB — TRIGLYCERIDES: TRIGLYCERIDES: 77 mg/dL (ref <150)

## 2015-02-14 LAB — MAGNESIUM: Magnesium: 2.2 mg/dL (ref 1.7–2.4)

## 2015-02-14 MED ORDER — FUROSEMIDE 10 MG/ML IJ SOLN
60.0000 mg | Freq: Every day | INTRAMUSCULAR | Status: DC
  Administered 2015-02-15: 60 mg via INTRAVENOUS
  Filled 2015-02-14: qty 6

## 2015-02-14 MED ORDER — FREE WATER
200.0000 mL | Freq: Three times a day (TID) | Status: DC
  Administered 2015-02-14 – 2015-02-15 (×3): 200 mL

## 2015-02-14 NOTE — Progress Notes (Signed)
ANTIBIOTIC CONSULT NOTE - FOLLOW UP  Pharmacy Consult: Primaxin / Zyvox  Indication:  PNA and fever  Allergies  Allergen Reactions  . Penicillins Hives and Shortness Of Breath  . Hydrochlorothiazide Other (See Comments)    hypercalemia    Patient Measurements: Height: 6' (182.9 cm) Weight: (!) 364 lb (165.109 kg) IBW/kg (Calculated) : 77.6 Adjusted Body Weight: 119 kg  Vital Signs: Temp: 98.2 F (36.8 C) (09/17 0800) Temp Source: Rectal (09/17 0800) BP: 127/89 mmHg (09/17 1101) Pulse Rate: 120 (09/17 1101) Intake/Output from previous day: 09/16 0701 - 09/17 0700 In: 3937.6 [I.V.:952.6; NG/GT:1485; IV Piggyback:1500] Out: 7500 [Urine:7500] Intake/Output from this shift: Total I/O In: 772.5 [I.V.:122.5; NG/GT:300; IV Piggyback:350] Out: 675 [Urine:675]  Labs:  Recent Labs  02/12/15 0411 02/13/15 0439 02/14/15 0415  WBC 16.3* 14.1* 12.8*  HGB 8.6* 8.7* 9.0*  PLT 391 436* 533*  CREATININE 1.19 1.19 0.84   Estimated Creatinine Clearance: 165.7 mL/min (by C-G formula based on Cr of 0.84). No results for input(s): VANCOTROUGH, VANCOPEAK, VANCORANDOM, GENTTROUGH, GENTPEAK, GENTRANDOM, TOBRATROUGH, TOBRAPEAK, TOBRARND, AMIKACINPEAK, AMIKACINTROU, AMIKACIN in the last 72 hours.   Medical History: Past Medical History  Diagnosis Date  . Gout   . HTN (hypertension), benign   . Chronic atrial fibrillation   . Hyperlipemia   . Nerve damage     right arm  . Obesity   . Insomnia   . GERD (gastroesophageal reflux disease)   . History of pneumonia 05/2014     two month hosp stay, vent, trach   . 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 Peptic ulcer 04/2014     Assessment: 53 YOM with severe CAP, today is day #14/14 of broad spectrum antibiotics, pt only begun improving after cdiff treatment was initiated. Pt now afebrile x48 hours, WBC continues to improve, down to 12.8 today, renal fx back to baseline.   Vanc 9/4 >> 9/9 Azactam  9/4 >> 9/7 LVQ 9/4 >> 9/11 Gent 9/7 >> 9/9 Azith/CTX at [facility] Zyvox 9/9 >> 9/17 Primaxin 9/9 >> 9/17 Anidula 9/12 >> 9/15 Flagyl 9/14 > PO vanc 9/14 >  9/4 RVP - negative 9/4 TA - negative 9/4 BCx x2 - CoNS (1 of 2, contaminant) 9/6 BCx x2 - NEGF 9/9 BCx x2 - NGTD 9/9 TA - GPC on Gram stain 9/13 cdiff Ag+, toxin neg  --> treating  Goal of Therapy:  Resolution of infection   Plan:  -Zyvox/Primaxin to end today -Continue flagyl/PO vanc for a total of 14 days -F/u results from bronch    Agapito Games, PharmD, BCPS Clinical Pharmacist Pager: [phone] 02/14/2015 11:44 AM

## 2015-02-14 NOTE — Progress Notes (Signed)
ETT holder changed, with assistance of RN, without complications

## 2015-02-14 NOTE — Progress Notes (Signed)
PULMONARY / CRITICAL CARE MEDICINE   Name: Adam Benson MRN: [id number] DOB: 1960-06-13    ADMISSION DATE:  02/01/2015 CONSULTATION DATE:  02/01/2015  REFERRING MD :  [facility]   CHIEF COMPLAINT:  Severe CAP  INITIAL PRESENTATION: 54 y/o M transferred from [facility] with dyspnea, weight gain, LE edema.  Failed bipap, intubated & transferred to [facility].  Found to have PNA with ARDS. ICU course complicated by atrial fibrillation with RVR requiring cardizem/amio gtts, bradycardia, volume overload and poor ventilator synchrony requiring vasopressors.    STUDIES:  9/04  Portable CXR - bilateral opacities. ETT about 6 cm above carina.  SIGNIFICANT EVENTS: 9/04  Intubated 9/04  transferred from OSH 9/05  some brady from Cardizem, amio 9/06  lasix started 9/07  fib rvr, neg 4.6 liters 9/08  more rvr, neg 4 liters 9/09  FOB. Purulent secretions. Fever 103 9/16 Repeat FOB. Purulent secretions lavaged and cleaned out.   SUBJECTIVE:  On PS wean. RT having difficulty passing suction catheter   VITAL SIGNS: Temp:  [98.2 F (36.8 C)-98.9 F (37.2 C)] 98.2 F (36.8 C) (09/17 0800) Pulse Rate:  [94-143] 138 (09/17 1000) Resp:  [15-32] 23 (09/17 1000) BP: (96-152)/(48-87) 120/66 mmHg (09/17 1000) SpO2:  [96 %-100 %] 100 % (09/17 1000) FiO2 (%):  [50 %-100 %] 50 % (09/17 0806) Weight:  [364 lb (165.109 kg)] 364 lb (165.109 kg) (09/17 0319)   HEMODYNAMICS:    VENTILATOR SETTINGS: Vent Mode:  [-] PSV;CPAP FiO2 (%):  [50 %-100 %] 50 % Set Rate:  [26 bmp] 26 bmp Vt Set:  [470 mL] 470 mL PEEP:  [8 cmH20] 8 cmH20 Pressure Support:  [5 cmH20] 5 cmH20 Plateau Pressure:  [14 cmH20-26 cmH20] 25 cmH20   INTAKE / OUTPUT:  Intake/Output Summary (Last 24 hours) at 02/14/15 1009 Last data filed at 02/14/15 1000  Gross per 24 hour  Intake 4078.68 ml  Output   7425 ml  Net -3346.32 ml    PHYSICAL EXAMINATION: General: obese adult male on vent, No distress Neuro: awake, follows  commands, calm, gen weakness  HEENT: Moist mucus membranes, ETT PULM: resps even non labored on PS 5/5, Vt , coarse throughout  CV: S1 S2, Irregular GI: Soft, + BS Extremities: scant edema   LABS:  CBC  Recent Labs Lab 02/12/15 0411 02/13/15 0439 02/14/15 0415  WBC 16.3* 14.1* 12.8*  HGB 8.6* 8.7* 9.0*  HCT 31.6* 31.5* 33.5*  PLT 391 436* 533*   Coag's No results for input(s): APTT, INR in the last 168 hours. BMET  Recent Labs Lab 02/12/15 0411 02/13/15 0439 02/14/15 0415  NA 146* 151* 154*  K 3.9 4.2 3.6  CL 105 107 109  CO2 35* 36* 39*  BUN 41* 44* 39*  CREATININE 1.19 1.19 0.84  GLUCOSE 120* 109* 128*   Electrolytes  Recent Labs Lab 02/11/15 0326 02/12/15 0411 02/13/15 0439 02/14/15 0415  CALCIUM 8.2* 8.2* 8.6* 8.7*  MG 2.3 2.3  --  2.2  PHOS 4.5 5.5*  --  2.7     Sepsis Markers  Recent Labs Lab 02/12/15 0411 02/13/15 0439  PROCALCITON 0.19 0.16   ABG  Recent Labs Lab 02/07/15 2109 02/13/15 0830 02/14/15 0440  PHART 7.452* 7.313* 7.373  PCO2ART 66.2* 74.6* 70.4*  PO2ART 83.0 63.8* 115*   Liver Enzymes  Recent Labs Lab 02/12/15 0411  AST 20  ALT 16*  ALKPHOS 36*  BILITOT 0.4  ALBUMIN 2.3*   Cardiac Enzymes No results for input(s): TROPONINI,  PROBNP in the last 168 hours. Glucose  Recent Labs Lab 02/13/15 0807 02/13/15 1152 02/13/15 1601 02/13/15 2011 02/14/15 0007 02/14/15 0402  GLUCAP 93 110* 132* 135* 117* 105*    Imaging Dg Chest Port 1 View  02/14/2015   CLINICAL DATA:  Acute respiratory failure  EXAM: PORTABLE CHEST - 1 VIEW  COMPARISON:  02/12/2015  FINDINGS: Endotracheal tube tip 7.2 cm above carina. Nasogastric tube extends at least as far as the stomach, tip not seen. Right IJ central line tip at the SVC Adam junction. Coarse airspace opacities throughout both lungs, marginally improved since previous exam. Mild cardiomegaly stable. Blunting of lateral costophrenic angles suggesting small effusions.  Degenerative changes in the right shoulder.  IMPRESSION: 1. Bilateral diffuse pulmonary airspace opacities, partially improved since previous exam. 2.  Support hardware stable in position.   Electronically Signed   By: Corlis Leak M.D.   On: 02/14/2015 08:40     ASSESSMENT / PLAN:  PULMONARY OETT 9/4 >> A: Acute on chronic hypoxic respiratory failure - in the setting of severe CAP / ARDS  Severe community acquired pneumonia Severe ARDS - improved with diuresis  Previous trach for same- just decannulated in march/april.  P:   Cont attempts at wean  Trend CXR Consider trial extubation prior to committing him to repeat trach - seems to be improved s/p FOB 9/16, tol PS 5/5 with good effort S/p second FOB with cleanout of purulent secerentios. Follow cultures  CARDIOVASCULAR R IJ CVL 9/4 >> 9/9 L IJ CVL 9/9 >>  A:  Septic Shock - in setting of CAP Elevated BNP History of atrial fibrillation - 9/7, 9.9 with episodes of RVR Suspected diastolic congestive heart failure P:  Continue Cardizem. Convert to PO. Tele monitoring  ASA 325 mg QD Lasix for diuresis - change to daily with worsening hypernatremia   RENAL A:  Chronic Kidney Disease III ARDS HypoK  Hypernatremia  P:   Trend BMP / UOP  Replace electrolytes as indicated. Add small amt free water   GASTROINTESTINAL A:   History of PUD -  December 2015 Constipation - resolved.  P:    Protonix via tube daily Cont TF    HEMATOLOGIC A:   Leukocytosis Hemoconcentration Mild Anemia  P:  Trend CBC SCDs / Heparin for DVT prophylaxis   INFECTIOUS A:   Severe CAP Likley contamination BC Spiking fever, missing organism Suspect CDiff P:   Urine Leg Ag 9/4 >> pending Urine Strep Ag 9/4 >> negative Respiratory Viral PCR 9/4 >> neg  BCx2 9/4 >>1/2 GPC >> suspected contaminant  UC 9/4 >> Sputum 9/4 >>normal flora  BCx2 9/6 >> NEG BCx2 9/9 >>  BAL / FOB 9/9 >> GPC's pairs/clusters >>   Abx: Vancomycin 9/4 >>  9/9 Aztreonam 9/4 >>9 /7 Levaquin 9/4 >> 9/11 Gent 9/7 >> 9/9  Rocephin 9/3 >> 9/3 (given at OSH) Azithromycin 9/3 >> 9/3 (given at OSH)  Linezolid 9/4 >> Primaxin 9/4>> Flagyl 9/14>> PO Vanco 9/14  On Flagyl and PO Vanco for C diff. Even though the toxin is negative the clinical suspicion for diarrhea is high. WBC count is improving after C diff treatment was started.   ENDOCRINE A:   Hyperglycemia  P:   SSI  NEUROLOGIC A:   Agitated Delirium - in the setting of ARDS/CAP. Improved.  P:   RASS goal: -1 Versed and fentanyl drip at high doses   FAMILY  - Updates: no family available 9/17.  - Inter-disciplinary family meet  or Palliative Care meeting due by:  9/11   Dirk Dress, NP 02/14/2015  10:09 AM Pager: [phone] or [phone]  Attending Note:  I have examined patient, reviewed labs, studies and notes. I have discussed the case with Jasper Riling  and I agree with the data and plans as amended above. VDRF in setting PNA and ARDS, c/b A Fib + RVR.  Appears to be improving, has tolerated PSV much of the day.  He may be an extubation candidate either today or tomorrow. His ETT also appears to be narrowed from secretions. Would favor a trial of extubation especially if we are going to have to change the ETT anyway. On my eval today he is tachypneic, awake. Not clear that he is ready to extubate now. Will continue to work on this goal . Independent critical care time is 50 minutes.   Levy Pupa, MD, PhD 02/14/2015, 2:47 PM [facility] [phone] or if no answer [phone]

## 2015-02-15 ENCOUNTER — Inpatient Hospital Stay (HOSPITAL_COMMUNITY): Payer: Medicaid Other

## 2015-02-15 LAB — CBC
HCT: 38.9 % — ABNORMAL LOW (ref 39.0–52.0)
Hemoglobin: 10.5 g/dL — ABNORMAL LOW (ref 13.0–17.0)
MCH: 20.9 pg — AB (ref 26.0–34.0)
MCHC: 27 g/dL — ABNORMAL LOW (ref 30.0–36.0)
MCV: 77.5 fL — AB (ref 78.0–100.0)
PLATELETS: 663 10*3/uL — AB (ref 150–400)
RBC: 5.02 MIL/uL (ref 4.22–5.81)
RDW: 24.1 % — AB (ref 11.5–15.5)
WBC: 12.4 10*3/uL — AB (ref 4.0–10.5)

## 2015-02-15 LAB — BASIC METABOLIC PANEL
ANION GAP: 9 (ref 5–15)
Anion gap: 6 (ref 5–15)
BUN: 27 mg/dL — AB (ref 6–20)
BUN: 30 mg/dL — ABNORMAL HIGH (ref 6–20)
CALCIUM: 9.1 mg/dL (ref 8.9–10.3)
CALCIUM: 9 mg/dL (ref 8.9–10.3)
CHLORIDE: 109 mmol/L (ref 101–111)
CO2: 41 mmol/L — ABNORMAL HIGH (ref 22–32)
CO2: 40 mmol/L — AB (ref 22–32)
Chloride: 112 mmol/L — ABNORMAL HIGH (ref 101–111)
Creatinine, Ser: 0.8 mg/dL (ref 0.61–1.24)
Creatinine, Ser: 0.79 mg/dL (ref 0.61–1.24)
GFR calc Af Amer: >60 mL/min (ref >60)
GFR calc non Af Amer: >60 mL/min (ref >60)
GLUCOSE: 122 mg/dL — AB (ref 65–99)
Glucose, Bld: 133 mg/dL — ABNORMAL HIGH (ref 65–99)
Potassium: 3.3 mmol/L — ABNORMAL LOW (ref 3.5–5.1)
Potassium: 3.2 mmol/L — ABNORMAL LOW (ref 3.5–5.1)
Sodium: 159 mmol/L — ABNORMAL HIGH (ref 135–145)
Sodium: 158 mmol/L — ABNORMAL HIGH (ref 135–145)

## 2015-02-15 LAB — GLUCOSE, CAPILLARY
GLUCOSE-CAPILLARY: 119 mg/dL — AB (ref 65–99)
Glucose-Capillary: 108 mg/dL — ABNORMAL HIGH (ref 65–99)
Glucose-Capillary: 117 mg/dL — ABNORMAL HIGH (ref 65–99)
Glucose-Capillary: 121 mg/dL — ABNORMAL HIGH (ref 65–99)
Glucose-Capillary: 124 mg/dL — ABNORMAL HIGH (ref 65–99)
Glucose-Capillary: 129 mg/dL — ABNORMAL HIGH (ref 65–99)
Glucose-Capillary: 138 mg/dL — ABNORMAL HIGH (ref 65–99)

## 2015-02-15 MED ORDER — POTASSIUM CHLORIDE 20 MEQ/15ML (10%) PO SOLN
20.0000 meq | ORAL | Status: AC
  Administered 2015-02-15 (×2): 20 meq
  Filled 2015-02-15 (×2): qty 15

## 2015-02-15 MED ORDER — FREE WATER
400.0000 mL | Freq: Four times a day (QID) | Status: DC
  Administered 2015-02-15 – 2015-02-20 (×19): 400 mL

## 2015-02-15 NOTE — Progress Notes (Signed)
Patient ventilator changed due to patient having periods of negative PEEP and constant alarming.  No complications during change.  Will continue to monitor.

## 2015-02-15 NOTE — Progress Notes (Signed)
PULMONARY / CRITICAL CARE MEDICINE   Name: Adam Benson MRN: [id number] DOB: 04-22-1961    ADMISSION DATE:  02/01/2015 CONSULTATION DATE:  02/01/2015  REFERRING MD :  [facility]   CHIEF COMPLAINT:  Severe CAP  INITIAL PRESENTATION: 54 y/o M transferred from [facility] with dyspnea, weight gain, LE edema.  Failed bipap, intubated & transferred to [facility].  Found to have PNA with ARDS. ICU course complicated by atrial fibrillation with RVR requiring cardizem/amio gtts, bradycardia, volume overload and poor ventilator synchrony requiring vasopressors.    STUDIES:  9/04  Portable CXR - bilateral opacities. ETT about 6 cm above carina.  SIGNIFICANT EVENTS: 9/04  Intubated 9/04  transferred from OSH 9/05  some brady from Cardizem, amio 9/06  lasix started 9/07  fib rvr, neg 4.6 liters 9/08  more rvr, neg 4 liters 9/09  FOB. Purulent secretions. Fever 103 9/16 Repeat FOB. Purulent secretions lavaged and cleaned out.   SUBJECTIVE:  On PS wean, intermittent desat but overall tolerating fairly well.    VITAL SIGNS: Pulse Rate:  [25-144] 136 (09/18 1059) Resp:  [17-33] 24 (09/18 1059) BP: (106-155)/(68-106) 140/106 mmHg (09/18 1059) SpO2:  [88 %-100 %] 93 % (09/18 1059) FiO2 (%):  [40 %-60 %] 50 % (09/18 1100) Weight:  [322 lb (146.058 kg)] 322 lb (146.058 kg) (09/18 0404)   HEMODYNAMICS:    VENTILATOR SETTINGS: Vent Mode:  [-] PSV;CPAP FiO2 (%):  [40 %-60 %] 50 % Set Rate:  [16 bmp] 16 bmp Vt Set:  [470 mL] 470 mL PEEP:  [5 cmH20] 5 cmH20 Pressure Support:  [5 cmH20-10 cmH20] 10 cmH20   INTAKE / OUTPUT:  Intake/Output Summary (Last 24 hours) at 02/15/15 1106 Last data filed at 02/15/15 1001  Gross per 24 hour  Intake 3960.46 ml  Output   3100 ml  Net 860.46 ml    PHYSICAL EXAMINATION: General: obese adult male on vent, No distress Neuro: awake, follows commands, calm, gen weakness  HEENT: Moist mucus membranes, ETT PULM: resps even non labored on PS 8/5, Vt  , coarse throughout  CV: S1 S2, Irregular GI: Soft, + BS Extremities: scant edema   LABS:  CBC  Recent Labs Lab 02/13/15 0439 02/14/15 0415 02/15/15 0421  WBC 14.1* 12.8* 12.4*  HGB 8.7* 9.0* 10.5*  HCT 31.5* 33.5* 38.9*  PLT 436* 533* 663*   Coag's No results for input(s): APTT, INR in the last 168 hours. BMET  Recent Labs Lab 02/13/15 0439 02/14/15 0415 02/15/15 0421  NA 151* 154* 159*  K 4.2 3.6 3.3*  CL 107 109 112*  CO2 36* 39* 41*  BUN 44* 39* 27*  CREATININE 1.19 0.84 0.80  GLUCOSE 109* 128* 122*   Electrolytes  Recent Labs Lab 02/11/15 0326 02/12/15 0411 02/13/15 0439 02/14/15 0415 02/15/15 0421  CALCIUM 8.2* 8.2* 8.6* 8.7* 9.1  MG 2.3 2.3  --  2.2  --   PHOS 4.5 5.5*  --  2.7  --      Sepsis Markers  Recent Labs Lab 02/12/15 0411 02/13/15 0439  PROCALCITON 0.19 0.16   ABG  Recent Labs Lab 02/13/15 0830 02/14/15 0440  PHART 7.313* 7.373  PCO2ART 74.6* 70.4*  PO2ART 63.8* 115*   Liver Enzymes  Recent Labs Lab 02/12/15 0411  AST 20  ALT 16*  ALKPHOS 36*  BILITOT 0.4  ALBUMIN 2.3*   Cardiac Enzymes No results for input(s): TROPONINI, PROBNP in the last 168 hours. Glucose  Recent Labs Lab 02/14/15 1228 02/14/15 1621 02/14/15  1956 02/14/15 2354 02/15/15 0356 02/15/15 0857  GLUCAP 111* 136* 105* 124* 108* 129*    Imaging Dg Chest Port 1 View  02/15/2015   CLINICAL DATA:  Respiratory failure.  Pneumonia.  EXAM: PORTABLE CHEST - 1 VIEW  COMPARISON:  02/14/2015.  FINDINGS: The endotracheal tube is 6.7 cm above the carina. The right IJ catheter is at the cavoatrial junction. Persistent cardiac enlargement and diffuse airspace process in the lungs. No large pleural effusion or definite pneumothorax.  IMPRESSION: Stable support apparatus.  Persistent cardiac enlargement and diffuse airspace process.   Electronically Signed   By: Rudie Meyer M.D.   On: 02/15/2015 08:03     ASSESSMENT / PLAN:  PULMONARY OETT 9/4  >> A: Acute on chronic hypoxic respiratory failure - in the setting of severe CAP / ARDS  Severe community acquired pneumonia Severe ARDS - improved with diuresis  Previous trach for same- just decannulated in march/april.  P:   Cont attempts at wean  Trend CXR Consider trial extubation prior to committing him to repeat trach especially if ?need to change ETT anyway r/t secretions   tol PS 8/5 with good effort but intermittent desat  S/p second FOB with cleanout of purulent secerentios. Follow cultures  CARDIOVASCULAR R IJ CVL 9/4 >> 9/9 L IJ CVL 9/9 >>  A:  Septic Shock - in setting of CAP Elevated BNP History of atrial fibrillation - 9/7, 9.9 with episodes of RVR Suspected diastolic congestive heart failure P:  Continue Cardizem Tele monitoring  ASA 325 mg QD Hold lasix for now with significant hypernatremia   RENAL A:  Chronic Kidney Disease III ARDS HypoK  Hypernatremia  P:   Trend BMP / UOP  Replace electrolytes as indicated. Increase free water  Repeat chem now   GASTROINTESTINAL A:   History of PUD -  December 2015 Constipation - resolved.  P:    Protonix via tube daily Cont TF    HEMATOLOGIC A:   Leukocytosis Hemoconcentration Mild Anemia  P:  Trend CBC SCDs / Heparin for DVT prophylaxis   INFECTIOUS A:   Severe CAP Likley contamination BC Spiking fever, missing organism Suspect CDiff P:   Urine Leg Ag 9/4 >> pending Urine Strep Ag 9/4 >> negative Respiratory Viral PCR 9/4 >> neg  BCx2 9/4 >>1/2 GPC >> suspected contaminant  UC 9/4 >> Sputum 9/4 >>normal flora  BCx2 9/6 >> NEG BCx2 9/9 >>  BAL / FOB 9/9 >> GPC's pairs/clusters >>   Abx: Vancomycin 9/4 >> 9/9 Aztreonam 9/4 >>9 /7 Levaquin 9/4 >> 9/11 Gent 9/7 >> 9/9  Rocephin 9/3 >> 9/3 (given at OSH) Azithromycin 9/3 >> 9/3 (given at OSH)  Linezolid 9/4 >> Primaxin 9/4>> Flagyl 9/14>> PO Vanco 9/14  On Flagyl and PO Vanco for C diff. Even though the toxin is negative  the clinical suspicion for diarrhea is high. WBC count is improving after C diff treatment was started.   ENDOCRINE A:   Hyperglycemia  P:   SSI  NEUROLOGIC A:   Agitated Delirium - in the setting of ARDS/CAP. Improved.  P:   RASS goal: -1 fentanyl drip - titrate as able    FAMILY  - Updates: no family available 9/18 - Inter-disciplinary family meet or Palliative Care meeting due by:  9/11   Dirk Dress, NP 02/15/2015  11:06 AM Pager: [phone] or [phone]   Attending Note:  I have examined patient, reviewed labs, studies and notes. I have discussed the case with  Jasper Riling, and I agree with the data and plans as amended above. VDRF with PNA and ARDS. Tolerated PSV for much of 9/17, but on my eval today he did not tolerate. He is awake, interacts. I believe we should try to extubate before committing to trach. Independent critical care time is 35 minutes.   Levy Pupa, MD, PhD 02/15/2015, 2:42 PM [facility] [phone] or if no answer [phone]

## 2015-02-15 NOTE — Progress Notes (Signed)
[facility] ADULT ICU REPLACEMENT PROTOCOL FOR AM LAB REPLACEMENT ONLY  The patient does apply for the [facility] Adult ICU Electrolyte Replacment Protocol based on the criteria listed below:   1. Is GFR >/= 40 ml/min? Yes.    Patient's GFR today is >60 2. Is urine output >/= 0.5 ml/kg/hr for the last 6 hours? Yes.   Patient's UOP is 0.5 ml/kg/hr 3. Is BUN < 60 mg/dL? Yes.    Patient's BUN today is 27 4. Abnormal electrolyte(s): K 3.3 5. Ordered repletion with: per protocl 6. If a panic level lab has been reported, has the CCM MD in charge been notified? No..   Physician:    Markus Daft A 02/15/2015 6:05 AM

## 2015-02-16 ENCOUNTER — Inpatient Hospital Stay (HOSPITAL_COMMUNITY): Payer: Medicaid Other

## 2015-02-16 DIAGNOSIS — J9600 Acute respiratory failure, unspecified whether with hypoxia or hypercapnia: Secondary | ICD-10-CM

## 2015-02-16 DIAGNOSIS — I5031 Acute diastolic (congestive) heart failure: Secondary | ICD-10-CM

## 2015-02-16 LAB — POCT I-STAT 3, ART BLOOD GAS (G3+)
Acid-Base Excess: 14 mmol/L — ABNORMAL HIGH (ref 0.0–2.0)
BICARBONATE: 39.7 meq/L — AB (ref 20.0–24.0)
O2 SAT: 96 %
TCO2: 41 mmol/L (ref 0–100)
pCO2 arterial: 55.8 mmHg — ABNORMAL HIGH (ref 35.0–45.0)
pH, Arterial: 7.46 — ABNORMAL HIGH (ref 7.350–7.450)
pO2, Arterial: 81 mmHg (ref 80.0–100.0)

## 2015-02-16 LAB — CBC
HEMATOCRIT: 38.6 % — AB (ref 39.0–52.0)
HEMOGLOBIN: 10.3 g/dL — AB (ref 13.0–17.0)
MCH: 20.7 pg — ABNORMAL LOW (ref 26.0–34.0)
MCHC: 26.7 g/dL — ABNORMAL LOW (ref 30.0–36.0)
MCV: 77.7 fL — AB (ref 78.0–100.0)
Platelets: 650 10*3/uL — ABNORMAL HIGH (ref 150–400)
RBC: 4.97 MIL/uL (ref 4.22–5.81)
RDW: 24 % — AB (ref 11.5–15.5)
WBC: 10.5 10*3/uL (ref 4.0–10.5)

## 2015-02-16 LAB — BASIC METABOLIC PANEL
Anion gap: 9 (ref 5–15)
Anion gap: 8 (ref 5–15)
BUN: 33 mg/dL — AB (ref 6–20)
BUN: 33 mg/dL — AB (ref 6–20)
CALCIUM: 9 mg/dL (ref 8.9–10.3)
CHLORIDE: 110 mmol/L (ref 101–111)
CO2: 37 mmol/L — ABNORMAL HIGH (ref 22–32)
CO2: 39 mmol/L — ABNORMAL HIGH (ref 22–32)
CREATININE: 1.07 mg/dL (ref 0.61–1.24)
Calcium: 9 mg/dL (ref 8.9–10.3)
Chloride: 105 mmol/L (ref 101–111)
Creatinine, Ser: 0.8 mg/dL (ref 0.61–1.24)
GFR calc Af Amer: >60 mL/min (ref >60)
GFR calc Af Amer: >60 mL/min (ref >60)
GFR calc non Af Amer: >60 mL/min (ref >60)
Glucose, Bld: 124 mg/dL — ABNORMAL HIGH (ref 65–99)
Glucose, Bld: 140 mg/dL — ABNORMAL HIGH (ref 65–99)
POTASSIUM: 3.3 mmol/L — AB (ref 3.5–5.1)
Potassium: 3.7 mmol/L (ref 3.5–5.1)
SODIUM: 156 mmol/L — AB (ref 135–145)
SODIUM: 152 mmol/L — AB (ref 135–145)

## 2015-02-16 LAB — APTT: APTT: 28 s (ref 24–37)

## 2015-02-16 LAB — GLUCOSE, CAPILLARY
GLUCOSE-CAPILLARY: 102 mg/dL — AB (ref 65–99)
GLUCOSE-CAPILLARY: 108 mg/dL — AB (ref 65–99)
GLUCOSE-CAPILLARY: 119 mg/dL — AB (ref 65–99)
GLUCOSE-CAPILLARY: 147 mg/dL — AB (ref 65–99)
Glucose-Capillary: 106 mg/dL — ABNORMAL HIGH (ref 65–99)
Glucose-Capillary: 123 mg/dL — ABNORMAL HIGH (ref 65–99)

## 2015-02-16 LAB — PNEUMOCYSTIS JIROVECI SMEAR BY DFA: PNEUMOCYSTIS JIROVECI AG: NEGATIVE

## 2015-02-16 LAB — PROTIME-INR
INR: 1.24 (ref 0.00–1.49)
PROTHROMBIN TIME: 15.7 s — AB (ref 11.6–15.2)

## 2015-02-16 LAB — CULTURE, BAL-QUANTITATIVE W GRAM STAIN: Colony Count: 2000

## 2015-02-16 LAB — CULTURE, BAL-QUANTITATIVE: SPECIAL REQUESTS: NORMAL

## 2015-02-16 MED ORDER — FUROSEMIDE 10 MG/ML IJ SOLN
60.0000 mg | Freq: Two times a day (BID) | INTRAMUSCULAR | Status: DC
  Administered 2015-02-16 – 2015-02-17 (×3): 60 mg via INTRAVENOUS
  Filled 2015-02-16 (×4): qty 6

## 2015-02-16 MED ORDER — DEXTROSE 5 % IV SOLN
INTRAVENOUS | Status: DC
  Administered 2015-02-16: 08:00:00 via INTRAVENOUS

## 2015-02-16 MED ORDER — POTASSIUM CHLORIDE 20 MEQ/15ML (10%) PO SOLN
20.0000 meq | ORAL | Status: AC
  Administered 2015-02-16 (×2): 20 meq
  Filled 2015-02-16 (×2): qty 15

## 2015-02-16 NOTE — Progress Notes (Signed)
ETT holder changed without any complications.  Will continue to monitor.

## 2015-02-16 NOTE — Progress Notes (Signed)
Nutrition Follow-up  DOCUMENTATION CODES:   Morbid obesity  INTERVENTION:    Continue TF via OGT with Vital High Protein at goal rate of 75 ml/h (1800 ml per day) with Prostat 30 ml TID to provide 2100 kcals, 203 gm protein, 1505 ml free water daily.  NUTRITION DIAGNOSIS:   Inadequate oral intake related to inability to eat as evidenced by NPO status.  Ongoing  GOAL:   Provide needs based on ASPEN/SCCM guidelines  Met  MONITOR:   TF tolerance, Vent status, Labs, Weight trends  ASSESSMENT:   Presented to [facility] after a few days of increasing dyspnea, weight gain, & lower extremity edema. Patient initially tried on BiPAP. Required intubation and transferred to [facility] on 9/4.  Patient is currently intubated on ventilator support Temp (24hrs), Avg:99.8 F (37.7 C), Min:98.8 F (37.1 C), Max:102.1 F (38.9 C)  Patient is currently receiving Vital High Protein at goal rate of 75 ml/h (1800 ml per day) with Prostat 30 ml TID to provide 2100 kcals, 203 gm protein, 1505 ml free water daily. Tolerating well, but still with watery stool. C diff positive, on Flagyl. Is receiving 400 ml free water every 6 hours.   Diet Order:  Diet NPO time specified  Skin:  Reviewed, no issues  Last BM:  9/19 (loose, watery, rectal tube)  Height:   Ht Readings from Last 1 Encounters:  11/18/14 6' (1.829 m)    Weight:   Wt Readings from Last 1 Encounters:  02/16/15 348 lb (157.852 kg)    Ideal Body Weight:  80.9 kg  BMI:  Body mass index is 47.19 kg/(m^2).  Estimated Nutritional Needs:   Kcal:  5992-3414  Protein:  202 gm  Fluid:  2.5 L  EDUCATION NEEDS:   No education needs identified at this time  Molli Barrows, [place], [place], [place] Pager [phone] After Hours Pager [phone]

## 2015-02-16 NOTE — Progress Notes (Signed)
PULMONARY / CRITICAL CARE MEDICINE   Name: Adam Benson MRN: [id number] DOB: January 28, 1961    ADMISSION DATE:  02/01/2015 CONSULTATION DATE:  02/01/2015  REFERRING MD :  [facility]   CHIEF COMPLAINT:  Severe CAP  INITIAL PRESENTATION: 55 y/o M transferred from [facility] with dyspnea, weight gain, LE edema.  Failed bipap, intubated & transferred to [facility].  Found to have PNA with ARDS. ICU course complicated by atrial fibrillation with RVR requiring cardizem/amio gtts, bradycardia, volume overload and poor ventilator synchrony requiring vasopressors.    STUDIES:  9/04  Portable CXR - bilateral opacities. ETT about 6 cm above carina.  SIGNIFICANT EVENTS: 9/04  Intubated 9/04  transferred from OSH 9/05  some brady from Cardizem, amio 9/06  lasix started 9/07  fib rvr, neg 4.6 liters 9/08  more rvr, neg 4 liters 9/09  FOB. Purulent secretions. Fever 103 9/16 Repeat FOB. Purulent secretions lavaged and cleaned out.   SUBJECTIVE: peep needs improved, no high fevers  VITAL SIGNS: Temp:  [98.8 F (37.1 C)-99.4 F (37.4 C)] 98.8 F (37.1 C) (09/19 0000) Pulse Rate:  [29-142] 120 (09/19 0600) Resp:  [16-33] 22 (09/19 0600) BP: (105-149)/(61-106) 119/71 mmHg (09/19 0600) SpO2:  [83 %-100 %] 87 % (09/19 0600) FiO2 (%):  [40 %-50 %] 40 % (09/19 0600) Weight:  [157.852 kg (348 lb)] 157.852 kg (348 lb) (09/19 0414)   HEMODYNAMICS:    VENTILATOR SETTINGS: Vent Mode:  [-] PRVC FiO2 (%):  [40 %-50 %] 40 % Set Rate:  [16 bmp] 16 bmp Vt Set:  [470 mL] 470 mL PEEP:  [5 cmH20] 5 cmH20 Pressure Support:  [5 cmH20-10 cmH20] 10 cmH20   INTAKE / OUTPUT:  Intake/Output Summary (Last 24 hours) at 02/16/15 0650 Last data filed at 02/16/15 0600  Gross per 24 hour  Intake 4816.59 ml  Output   4650 ml  Net 166.59 ml    PHYSICAL EXAMINATION: General: obese adult male on vent, No distress Neuro: awake, follows commands, calm,  rass -1 HEENT: Moist mucus membranes, ETT wnl PULM:  coarse mild reduced CV: S1 S2, Irregular GI: Soft, + BS, no r/g Extremities: scant edema to 1 plus  LABS:  CBC  Recent Labs Lab 02/14/15 0415 02/15/15 0421 02/16/15 0433  WBC 12.8* 12.4* 10.5  HGB 9.0* 10.5* 10.3*  HCT 33.5* 38.9* 38.6*  PLT 533* 663* 650*   Coag's No results for input(s): APTT, INR in the last 168 hours. BMET  Recent Labs Lab 02/15/15 0421 02/15/15 1130 02/16/15 0433  NA 159* 158* 156*  K 3.3* 3.2* 3.3*  CL 112* 109 110  CO2 41* 40* 37*  BUN 27* 30* 33*  CREATININE 0.80 0.79 0.80  GLUCOSE 122* 133* 124*   Electrolytes  Recent Labs Lab 02/11/15 0326 02/12/15 0411  02/14/15 0415 02/15/15 0421 02/15/15 1130 02/16/15 0433  CALCIUM 8.2* 8.2*  < > 8.7* 9.1 9.0 9.0  MG 2.3 2.3  --  2.2  --   --   --   PHOS 4.5 5.5*  --  2.7  --   --   --   < > = values in this interval not displayed.   Sepsis Markers  Recent Labs Lab 02/12/15 0411 02/13/15 0439  PROCALCITON 0.19 0.16   ABG  Recent Labs Lab 02/13/15 0830 02/14/15 0440  PHART 7.313* 7.373  PCO2ART 74.6* 70.4*  PO2ART 63.8* 115*   Liver Enzymes  Recent Labs Lab 02/12/15 0411  AST 20  ALT 16*  ALKPHOS 36*  BILITOT 0.4  ALBUMIN 2.3*   Cardiac Enzymes No results for input(s): TROPONINI, PROBNP in the last 168 hours. Glucose  Recent Labs Lab 02/15/15 0857 02/15/15 1120 02/15/15 1522 02/15/15 2010 02/16/15 0015 02/16/15 0400  GLUCAP 129* 121* 117* 119* 106* 102*    Imaging No results found.   ASSESSMENT / PLAN:  PULMONARY OETT 9/4 >> A: Acute on chronic hypoxic respiratory failure - in the setting of severe CAP / ARDS  Severe community acquired pneumonia Severe ARDS - improved with diuresis remains wit infiltrates Previous trach for same- just decannulated in march/april.  P:  Trend CXR now and in am  Was 166 pos last 24 hr, increase lasix as tolerated Weaning today goal cpap 5ps5 goal 2 hr, if fails -trachj in am  Noted retained co2 baseline now  reflects ARDS, pulmonary cripple Keep same MV avoid alk  CARDIOVASCULAR R IJ CVL 9/4 >> 9/9 L IJ CVL 9/9 >>  A:  Septic Shock - in setting of CAP Elevated BNP History of atrial fibrillation - 9/7, 9.9 with episodes of RVR Suspected diastolic congestive heart failure P:  Continue Cardizem oral, if needed can increase Tele monitoring  ASA 325 mg QD  RENAL A:  Chronic Kidney Disease III ARDS HypoK  Hypernatremia  P:   Trend BMP / UOP  Replace electrolytes as indicated. Increase free water add d5w Repeat chem am and pm Lasix withj free water ok  GASTROINTESTINAL A:   History of PUD -  December 2015 Constipation - resolved.  P:    Protonix via tube daily Cont TF  May need npo if trach  HEMATOLOGIC A:   Leukocytosis Hemoconcentration Mild Anemia  P:  Trend CBC SCDs / Heparin for DVT prophylaxis  Coag snow  INFECTIOUS A:   Severe CAP Likley contamination BC Spiking fever, missing organism Suspect CDiff P:   Urine Leg Ag 9/4 >> pending Urine Strep Ag 9/4 >> negative Respiratory Viral PCR 9/4 >> neg  BCx2 9/4 >>1/2 GPC >> suspected contaminant  UC 9/4 >> Sputum 9/4 >>normal flora  BCx2 9/6 >> NEG BCx2 9/9 >>  BAL / FOB 9/9 >> GPC's pairs/clusters >>  9/18 BAl bronch>>>  Abx: Vancomycin 9/4 >> 9/9 Aztreonam 9/4 >>9 /7 Levaquin 9/4 >> 9/11 Gent 9/7 >> 9/9  Rocephin 9/3 >> 9/3 (given at OSH) Azithromycin 9/3 >> 9/3 (given at OSH)  Linezolid 9/4 >> Primaxin 9/4>> Flagyl 9/14>> PO Vanco 9/14  On Flagyl and PO Vanco for C diff.maintain Willa assess stop dates  ENDOCRINE A:   Hyperglycemia  P:   SSI  NEUROLOGIC A:   Agitated Delirium - in the setting of ARDS/CAP. Improved.  P:   RASS goal: -1 fentanyl drip - titrate as able  Versed drip reduction if able to q6h if able   FAMILY  - Updates: no family available 9/18 - Inter-disciplinary family meet or Palliative Care meeting due by:  9/11  Ccm time 30 min   Lavon Paganini. Titus Mould,  MD, [place] Pgr: [facility]

## 2015-02-16 NOTE — Progress Notes (Signed)
[facility] ADULT ICU REPLACEMENT PROTOCOL FOR AM LAB REPLACEMENT ONLY  The patient does apply for the [facility] Adult ICU Electrolyte Replacment Protocol based on the criteria listed below:   1. Is GFR >/= 40 ml/min? Yes.    Patient's GFR today is >60 2. Is urine output >/= 0.5 ml/kg/hr for the last 6 hours? Yes.   Patient's UOP is 0.5 ml/kg/hr 3. Is BUN < 60 mg/dL? Yes.    Patient's BUN today is 33 4. Abnormal electrolyte(s): K 3.3 5. Ordered repletion with: per protocol 6. If a panic level lab has been reported, has the CCM MD in charge been notified? No..   Physician:    Markus Daft A 02/16/2015 6:11 AM

## 2015-02-17 ENCOUNTER — Institutional Professional Consult (permissible substitution): Payer: Medicaid Other | Admitting: Pulmonary Disease

## 2015-02-17 ENCOUNTER — Inpatient Hospital Stay (HOSPITAL_COMMUNITY): Payer: Medicaid Other

## 2015-02-17 LAB — GLUCOSE, CAPILLARY
GLUCOSE-CAPILLARY: 102 mg/dL — AB (ref 65–99)
GLUCOSE-CAPILLARY: 111 mg/dL — AB (ref 65–99)
GLUCOSE-CAPILLARY: 120 mg/dL — AB (ref 65–99)
Glucose-Capillary: 192 mg/dL — ABNORMAL HIGH (ref 65–99)
Glucose-Capillary: 109 mg/dL — ABNORMAL HIGH (ref 65–99)
Glucose-Capillary: 118 mg/dL — ABNORMAL HIGH (ref 65–99)

## 2015-02-17 LAB — URINALYSIS, ROUTINE W REFLEX MICROSCOPIC
BILIRUBIN URINE: NEGATIVE
GLUCOSE, UA: NEGATIVE mg/dL
KETONES UR: NEGATIVE mg/dL
Leukocytes, UA: NEGATIVE
NITRITE: NEGATIVE
PH: 6 (ref 5.0–8.0)
Protein, ur: NEGATIVE mg/dL
Specific Gravity, Urine: 1.016 (ref 1.005–1.030)
Urobilinogen, UA: 0.2 mg/dL (ref 0.0–1.0)

## 2015-02-17 LAB — TRIGLYCERIDES: Triglycerides: 140 mg/dL (ref <150)

## 2015-02-17 LAB — CBC WITH DIFFERENTIAL/PLATELET
Basophils Absolute: 0.1 10*3/uL (ref 0.0–0.1)
Basophils Relative: 1 %
EOS PCT: 4 %
Eosinophils Absolute: 0.5 10*3/uL (ref 0.0–0.7)
HCT: 35.7 % — ABNORMAL LOW (ref 39.0–52.0)
Hemoglobin: 9.8 g/dL — ABNORMAL LOW (ref 13.0–17.0)
LYMPHS PCT: 9 %
Lymphs Abs: 1.2 10*3/uL (ref 0.7–4.0)
MCH: 21.2 pg — ABNORMAL LOW (ref 26.0–34.0)
MCHC: 27.5 g/dL — ABNORMAL LOW (ref 30.0–36.0)
MCV: 77.1 fL — AB (ref 78.0–100.0)
MONO ABS: 1 10*3/uL (ref 0.1–1.0)
Monocytes Relative: 8 %
NEUTROS PCT: 78 %
Neutro Abs: 10.2 10*3/uL — ABNORMAL HIGH (ref 1.7–7.7)
PLATELETS: 663 10*3/uL — AB (ref 150–400)
RBC: 4.63 MIL/uL (ref 4.22–5.81)
RDW: 24.1 % — ABNORMAL HIGH (ref 11.5–15.5)
WBC: 13 10*3/uL — AB (ref 4.0–10.5)

## 2015-02-17 LAB — COMPREHENSIVE METABOLIC PANEL
ALT: 13 U/L — AB (ref 17–63)
AST: 23 U/L (ref 15–41)
Albumin: 2.3 g/dL — ABNORMAL LOW (ref 3.5–5.0)
Alkaline Phosphatase: 37 U/L — ABNORMAL LOW (ref 38–126)
Anion gap: 6 (ref 5–15)
BUN: 36 mg/dL — AB (ref 6–20)
CHLORIDE: 102 mmol/L (ref 101–111)
CO2: 41 mmol/L — AB (ref 22–32)
CREATININE: 1.29 mg/dL — AB (ref 0.61–1.24)
Calcium: 8.6 mg/dL — ABNORMAL LOW (ref 8.9–10.3)
GFR calc Af Amer: >60 mL/min (ref >60)
Glucose, Bld: 189 mg/dL — ABNORMAL HIGH (ref 65–99)
Potassium: 3.9 mmol/L (ref 3.5–5.1)
SODIUM: 149 mmol/L — AB (ref 135–145)
Total Bilirubin: 0.5 mg/dL (ref 0.3–1.2)
Total Protein: 5.1 g/dL — ABNORMAL LOW (ref 6.5–8.1)

## 2015-02-17 LAB — URINE MICROSCOPIC-ADD ON

## 2015-02-17 LAB — POCT I-STAT 3, ART BLOOD GAS (G3+)
ACID-BASE EXCESS: 16 mmol/L — AB (ref 0.0–2.0)
BICARBONATE: 42.7 meq/L — AB (ref 20.0–24.0)
O2 SAT: 99 %
TCO2: 45 mmol/L (ref 0–100)
pCO2 arterial: 69.1 mmHg (ref 35.0–45.0)
pH, Arterial: 7.407 (ref 7.350–7.450)
pO2, Arterial: 148 mmHg — ABNORMAL HIGH (ref 80.0–100.0)

## 2015-02-17 MED ORDER — MIDAZOLAM HCL 2 MG/2ML IJ SOLN
4.0000 mg | Freq: Once | INTRAMUSCULAR | Status: AC
  Administered 2015-02-17: 4 mg via INTRAVENOUS
  Filled 2015-02-17: qty 4

## 2015-02-17 MED ORDER — DEXMEDETOMIDINE HCL IN NACL 400 MCG/100ML IV SOLN
0.4000 ug/kg/h | INTRAVENOUS | Status: DC
Start: 2015-02-17 — End: 2015-02-17
  Administered 2015-02-17 (×2): 1.2 ug/kg/h via INTRAVENOUS
  Filled 2015-02-17: qty 200

## 2015-02-17 MED ORDER — AMIODARONE HCL IN DEXTROSE 360-4.14 MG/200ML-% IV SOLN
60.0000 mg/h | INTRAVENOUS | Status: AC
  Administered 2015-02-17 (×2): 60 mg/h via INTRAVENOUS
  Filled 2015-02-17: qty 200

## 2015-02-17 MED ORDER — AMIODARONE HCL IN DEXTROSE 360-4.14 MG/200ML-% IV SOLN
INTRAVENOUS | Status: AC
  Administered 2015-02-17: 60 mg/h via INTRAVENOUS
  Filled 2015-02-17: qty 200

## 2015-02-17 MED ORDER — AMIODARONE LOAD VIA INFUSION
150.0000 mg | Freq: Once | INTRAVENOUS | Status: AC
  Administered 2015-02-17: 150 mg via INTRAVENOUS
  Filled 2015-02-17: qty 83.34

## 2015-02-17 MED ORDER — VECURONIUM BROMIDE 10 MG IV SOLR
10.0000 mg | Freq: Once | INTRAVENOUS | Status: AC
  Administered 2015-02-17: 10 mg via INTRAVENOUS

## 2015-02-17 MED ORDER — DILTIAZEM HCL 100 MG IV SOLR
5.0000 mg/h | INTRAVENOUS | Status: DC
  Administered 2015-02-17: 5 mg/h via INTRAVENOUS
  Administered 2015-02-18 (×2): 15 mg/h via INTRAVENOUS
  Filled 2015-02-17 (×3): qty 100

## 2015-02-17 MED ORDER — AMIODARONE HCL IN DEXTROSE 360-4.14 MG/200ML-% IV SOLN
30.0000 mg/h | INTRAVENOUS | Status: DC
  Administered 2015-02-17 – 2015-02-20 (×5): 30 mg/h via INTRAVENOUS
  Filled 2015-02-17 (×12): qty 200

## 2015-02-17 MED ORDER — PROPOFOL 1000 MG/100ML IV EMUL
INTRAVENOUS | Status: AC
Start: 2015-02-17 — End: 2015-02-18
  Filled 2015-02-17: qty 100

## 2015-02-17 MED ORDER — ETOMIDATE 2 MG/ML IV SOLN
40.0000 mg | Freq: Once | INTRAVENOUS | Status: AC
  Administered 2015-02-17: 40 mg via INTRAVENOUS
  Filled 2015-02-17: qty 20

## 2015-02-17 MED ORDER — DEXMEDETOMIDINE HCL IN NACL 200 MCG/50ML IV SOLN
0.4000 ug/kg/h | INTRAVENOUS | Status: DC
Start: 2015-02-17 — End: 2015-02-17
  Administered 2015-02-17 (×3): 1.2 ug/kg/h via INTRAVENOUS
  Filled 2015-02-17 (×2): qty 50
  Filled 2015-02-17: qty 100

## 2015-02-17 MED ORDER — FENTANYL CITRATE (PF) 100 MCG/2ML IJ SOLN
200.0000 ug | Freq: Once | INTRAMUSCULAR | Status: AC
  Administered 2015-02-17: 200 ug via INTRAVENOUS
  Filled 2015-02-17: qty 4

## 2015-02-17 MED ORDER — PROPOFOL 500 MG/50ML IV EMUL
5.0000 ug/kg/min | Freq: Once | INTRAVENOUS | Status: DC
  Filled 2015-02-17: qty 50

## 2015-02-17 NOTE — Progress Notes (Signed)
eLink Physician-Brief Progress Note Patient Name: Adam Benson DOB: [birth year] MRN: [id number]   Date of Service  02/17/2015  HPI/Events of Note  Vent dysbnchrony despite fent gtt. Tachycardic. Some restless. Anxious looking on camera exam. Noprrmotensive  eICU Interventions  Start precedex gtt     Intervention Category Major Interventions: Other:  RAMASWAMY,MURALI 02/17/2015, 12:40 AM

## 2015-02-17 NOTE — Progress Notes (Signed)
PULMONARY / CRITICAL CARE MEDICINE   Name: Mickel Gannett MRN: [id number] DOB: [birth year]    ADMISSION DATE:  02/01/2015 CONSULTATION DATE:  02/01/2015  REFERRING MD :  [facility]   CHIEF COMPLAINT:  Severe CAP  INITIAL PRESENTATION: 54 y/o M transferred from [facility] with dyspnea, weight gain, LE edema.  Failed bipap, intubated & transferred to [facility].  Found to have PNA with ARDS. ICU course complicated by atrial fibrillation with RVR requiring cardizem/amio gtts, bradycardia, volume overload and poor ventilator synchrony requiring vasopressors.    STUDIES:  9/04  Portable CXR - bilateral opacities. ETT about 6 cm above carina.  SIGNIFICANT EVENTS: 9/04  Intubated 9/04  transferred from OSH 9/05  some brady from Cardizem, amio 9/06  lasix started 9/07  fib rvr, neg 4.6 liters 9/08  more rvr, neg 4 liters 9/09  FOB. Purulent secretions. Fever 103 9/16 Repeat FOB. Purulent secretions lavaged and cleaned out.   SUBJECTIVE: precedex added, some desaturation, neg 1.5 liters  VITAL SIGNS: Temp:  [99.4 F (37.4 C)-102.3 F (39.1 C)] 102.3 F (39.1 C) (09/19 2017) Pulse Rate:  [28-153] 42 (09/20 0700) Resp:  [17-38] 21 (09/20 0700) BP: (83-149)/(44-112) 91/71 mmHg (09/20 0700) SpO2:  [80 %-100 %] 97 % (09/20 0700) FiO2 (%):  [40 %-100 %] 100 % (09/20 0330) Weight:  [161.934 kg (357 lb)] 161.934 kg (357 lb) (09/20 0313)   HEMODYNAMICS:    VENTILATOR SETTINGS: Vent Mode:  [-] PRVC FiO2 (%):  [40 %-100 %] 100 % Set Rate:  [16 bmp] 16 bmp Vt Set:  [470 mL] 470 mL PEEP:  [5 cmH20] 5 cmH20 Pressure Support:  [5 cmH20] 5 cmH20 Plateau Pressure:  [25 cmH20] 25 cmH20   INTAKE / OUTPUT:  Intake/Output Summary (Last 24 hours) at 02/17/15 0744 Last data filed at 02/17/15 0700  Gross per 24 hour  Intake 4313.36 ml  Output   5250 ml  Net -936.64 ml    PHYSICAL EXAMINATION: General: sedated  Neuro: perr, rass -3 HEENT: Moist mucus membranes, ETT wnl PULM: coarse  unchanged CV: S1 S2, Irregular GI: Soft, + BS, no r/g Extremities:edema mild  LABS:  CBC  Recent Labs Lab 02/15/15 0421 02/16/15 0433 02/17/15 0420  WBC 12.4* 10.5 13.0*  HGB 10.5* 10.3* 9.8*  HCT 38.9* 38.6* 35.7*  PLT 663* 650* 663*   Coag's  Recent Labs Lab 02/16/15 0812  APTT 28  INR 1.24   BMET  Recent Labs Lab 02/16/15 0433 02/16/15 1500 02/17/15 0420  NA 156* 152* 149*  K 3.3* 3.7 3.9  CL 110 105 102  CO2 37* 39* 41*  BUN 33* 33* 36*  CREATININE 0.80 1.07 1.29*  GLUCOSE 124* 140* 189*   Electrolytes  Recent Labs Lab 02/11/15 0326 02/12/15 0411  02/14/15 0415  02/16/15 0433 02/16/15 1500 02/17/15 0420  CALCIUM 8.2* 8.2*  < > 8.7*  < > 9.0 9.0 8.6*  MG 2.3 2.3  --  2.2  --   --   --   --   PHOS 4.5 5.5*  --  2.7  --   --   --   --   < > = values in this interval not displayed.   Sepsis Markers  Recent Labs Lab 02/12/15 0411 02/13/15 0439  PROCALCITON 0.19 0.16   ABG  Recent Labs Lab 02/13/15 0830 02/14/15 0440 02/16/15 1207  PHART 7.313* 7.373 7.460*  PCO2ART 74.6* 70.4* 55.8*  PO2ART 63.8* 115* 81.0   Liver Enzymes  Recent Labs Lab  02/12/15 0411 02/17/15 0420  AST 20 23  ALT 16* 13*  ALKPHOS 36* 37*  BILITOT 0.4 0.5  ALBUMIN 2.3* 2.3*   Cardiac Enzymes No results for input(s): TROPONINI, PROBNP in the last 168 hours. Glucose  Recent Labs Lab 02/16/15 0826 02/16/15 1149 02/16/15 1549 02/16/15 2012 02/17/15 0008 02/17/15 0356  GLUCAP 108* 123* 119* 147* 102* 192*    Imaging Dg Chest Port 1 View  02/17/2015   CLINICAL DATA:  ARDS.  EXAM: PORTABLE CHEST - 1 VIEW  COMPARISON:  02/16/2015.  FINDINGS: Endotracheal tube, NG tube, right IJ line in stable position. Cardiomegaly with diffuse bilateral alveolar infiltrates are again noted. No interim change. No pleural effusion or pneumothorax.  IMPRESSION: 1. Lines and tubes in stable position. 2. Stable cardiomegaly. 3. Diffuse bilateral alveolar infiltrates, no  interim improvement.   Electronically Signed   By: Maisie Fus  Register   On: 02/17/2015 07:16     ASSESSMENT / PLAN:  PULMONARY OETT 9/4 >> A: Acute on chronic hypoxic respiratory failure - in the setting of severe CAP / ARDS  Severe community acquired pneumonia Severe ARDS - improved with diuresis remains wit infiltrates Previous trach for same- just decannulated in march/april.  P:  ABG reviewed, slight reduction rate / MV Wean attempt again may be difficult as O2 needs increased precedex not a great choice for vent desynchrony, as doesn't suppress drive, see neuro Despite  Neg balance infiltrates increased, repeat pcxr  CARDIOVASCULAR R IJ CVL 9/4 >> 9/9 L IJ CVL 9/9 >>  A:  Septic Shock - in setting of CAP Elevated BNP History of atrial fibrillation - 9/7, 9.9 with episodes of RVR Suspected diastolic congestive heart failure P:  Continue Cardizem oral, when precedex reduce may need increase Tele monitoring  ASA 325 mg QD  RENAL A:  Chronic Kidney Disease III ARDS Hypernatremia  improved P:   Trend BMP / UOP  Keep free water Dc d5w Repeat chem am Lasix dc  GASTROINTESTINAL A:   History of PUD -  December 2015 Constipation - resolved.  P:    Protonix via tube daily Cont TF  NPO for trach  HEMATOLOGIC A:   Leukocytosis Hemoconcentration Mild Anemia  P:  Trend CBC SCDs / Heparin for DVT prophylaxis maintain Coags wnl  INFECTIOUS A:   Severe CAP Likley contamination BC Spiking fever, missing organism Suspect CDiff Temp spike 9/20 P:   Urine Leg Ag 9/4 >> pending Urine Strep Ag 9/4 >> negative Respiratory Viral PCR 9/4 >> neg  BCx2 9/4 >>1/2 GPC >> suspected contaminant  UC 9/4 >> Sputum 9/4 >>normal flora  BCx2 9/6 >> NEG BCx2 9/9 >>  BAL / FOB 9/9 >> GPC's pairs/clusters >>  9/18 BAl bronch>>>  Abx: Vancomycin 9/4 >> 9/9 Aztreonam 9/4 >>9 /7 Levaquin 9/4 >> 9/11 Gent 9/7 >> 9/9  Rocephin 9/3 >> 9/3 (given at OSH) Azithromycin 9/3  >> 9/3 (given at OSH)  Linezolid 9/4 >>off Primaxin 9/4>>off Flagyl 9/14>> PO Vanco 9/14  Send repeat UA, bal from bronch Assess if can dc line If spike further ,. Re add imipenem  On Flagyl and PO Vanco for C diff.maintain  ENDOCRINE A:   Hyperglycemia  P:   SSI  NEUROLOGIC A:   Agitated Delirium - in the setting of ARDS/CAP. Improved.  P:   RASS goal: -2 fentanyl drip - titrate as able  Versed prn precedex dc  Needs trach If needed, add prop fent to 400   FAMILY  - Updates:  no family available 9/18 - Inter-disciplinary family meet or Palliative Care meeting due by:  9/11  Ccm time 30 min   [name]. Tyson Alias, MD, FACP Pgr: [phone] [facility]

## 2015-02-17 NOTE — Procedures (Signed)
Bronchoscopy Procedure Note Adam Benson [id number] 1961/04/21  Procedure: Bronchoscopy Indications: Diagnostic evaluation of the airways  Procedure Details Consent: Risks of procedure as well as the alternatives and risks of each were explained to the (patient/caregiver).  Consent for procedure obtained. Time Out: Verified patient identification, verified procedure, site/side was marked, verified correct patient position, special equipment/implants available, medications/allergies/relevent history reviewed, required imaging and test results available.  Performed  In preparation for procedure, patient was given 100% FiO2 and bronchoscope lubricated. Sedation: Benzodiazepines, Muscle relaxants and Etomidate  Airway entered and the following bronchi were examined: RUL, RML, RLL, LUL, LLL and Bronchi.   Bronchoscope removed.    Evaluation Hemodynamic Status: BP stable throughout; O2 sats: stable throughout Patient's Current Condition: stable Specimens:  None Complications: No apparent complications Patient did tolerate procedure well.   Adam Benson 02/17/2015

## 2015-02-17 NOTE — Procedures (Signed)
Bedside Tracheostomy Insertion Procedure Note   Patient Details:   Name: Adam Benson DOB: July 22, 1960 MRN: [id number]  Procedure: Tracheostomy  Pre Procedure Assessment: ET Tube Size:8.0  ET Tube secured at lip (cm):22  Bite block in place: No Breath Sounds: Clear  Post Procedure Assessment: BP 114/86 mmHg  Pulse 143  Temp(Src) 102.3 F (39.1 C) (Rectal)  Resp 16  Ht 6' (1.829 m)  Wt 357 lb (161.934 kg)  BMI 48.41 kg/m2  SpO2 95% O2 sats: stable throughout Complications: No apparent complications Patient did tolerate procedure well Tracheostomy Brand:Shiley Tracheostomy Style:Cuffed Tracheostomy Size: 8.0 Tracheostomy Secured KNL:[id number] Tracheostomy Placement Confirmation:Trach cuff visualized and in place    Icker, Grigas [facility] 02/17/2015, 2:04 PM

## 2015-02-17 NOTE — Progress Notes (Addendum)
Versed 65 mls wasted in sink with Eddie North, RN

## 2015-02-17 NOTE — Progress Notes (Signed)
eLink Physician-Brief Progress Note Patient Name: Adam Benson DOB: 1961/05/08 MRN: [id number]   Date of Service  02/17/2015  HPI/Events of Note  AFIB with RVR. Already on Amiodarone IV infusion, Amiodarone bolus given and Cardizem PO.   eICU Interventions  Will order: 1. D/C Cardizem PO. 2. Cardizem IV infusion.      Intervention Category Intermediate Interventions: Arrhythmia - evaluation and management  Sommer,Steven Eugene 02/17/2015, 5:04 PM

## 2015-02-17 NOTE — Procedures (Signed)
Name:  [name] MRN:  [id number] DOB:  [birth year]  OPERATIVE NOTE  Procedure:  Percutaneous tracheostomy.  Indications:  Ventilator-dependent respiratory failure.  Consent:  Procedure, alternatives, risks and benefits discussed with medical POA.  Questions answered.  Consent obtained.  Anesthesia:  Versed, fent, etomidate, vec  Procedure summary:  Appropriate equipment was assembled.  The patient was identified as Adam Benson and safety timeout was performed. The patient was placed in supine position with a towel roll behind shoulder blades and neck extended.  Sterile technique was used. [name] neck and upper chest were prepped using chlorhexidine / alcohol scrub and the field was draped in usual sterile fashion with full body drape. After the adequate sedation / anesthesia was achieved, attention was directed at the midline trachea, where the cricothyroid membrane was palpated. Approximately two fingerbreadths above the sternal notch, a horizontal incision was created through OLD scar with a scalpel after local infiltration with 0.2% Lidocaine. Then, using Seldinger technique and a percutaneous tracheostomy set, the trachea was entered with a 14 gauge needle with an overlying sheath. This was all confirmed under direct visualization of a fiberoptic flexible bronchoscope. Entrance into the trachea was identified through the third tracheal ring interspace. Following this, a guidewire was inserted. The needle was removed, leaving the sheath and the guidewire intact. Next, the sheath was removed and a small dilator was inserted. The tracheal rings were then dilated. A #8  Shiley was then opened. The balloon was checked. It was placed over a tracheal dilator, which was then advanced over the guidewire and through the previously dilated tract. The Shiley tracheostomy tube was noted to pass in the trachea with little resistance. The guidewire and dilator tubes were removed from the trachea. An inner  cannula was placed through the tracheostomy tube. The tracheostomy was then secured at the anterior neck with 4 monofilament sutures. The oral endotracheal tube was removed and the ventilator was attached to the newly placed tracheostomy tube. Adequate tidal volumes were noted. The cuff was inflated and no evidence of air leak was noted. No evidence of bleeding was noted. At this point, the procedure was concluded. Post-procedure chest x-ray was ordered.  Complications:  No immediate complications were noted.  Hemodynamic parameters and oxygenation remained stable throughout the procedure.  Estimated blood loss:  Less then 10 mL.   Can follow up in trach clinic [phone] [name]., MD Pulmonary and Critical Care Medicine [facility] Pager: [phone]  02/17/2015, 3:51 PM

## 2015-02-18 ENCOUNTER — Inpatient Hospital Stay (HOSPITAL_COMMUNITY): Payer: Medicaid Other

## 2015-02-18 LAB — BASIC METABOLIC PANEL
ANION GAP: 4 — AB (ref 5–15)
BUN: 37 mg/dL — ABNORMAL HIGH (ref 6–20)
CALCIUM: 8.4 mg/dL — AB (ref 8.9–10.3)
CO2: 42 mmol/L — ABNORMAL HIGH (ref 22–32)
Chloride: 102 mmol/L (ref 101–111)
Creatinine, Ser: 1.17 mg/dL (ref 0.61–1.24)
Glucose, Bld: 146 mg/dL — ABNORMAL HIGH (ref 65–99)
POTASSIUM: 3.4 mmol/L — AB (ref 3.5–5.1)
SODIUM: 148 mmol/L — AB (ref 135–145)

## 2015-02-18 LAB — POCT I-STAT 3, ART BLOOD GAS (G3+)
ACID-BASE EXCESS: 14 mmol/L — AB (ref 0.0–2.0)
Bicarbonate: 40.2 mEq/L — ABNORMAL HIGH (ref 20.0–24.0)
O2 SAT: 94 %
PH ART: 7.429 (ref 7.350–7.450)
TCO2: 42 mmol/L (ref 0–100)
pCO2 arterial: 60.7 mmHg (ref 35.0–45.0)
pO2, Arterial: 73 mmHg — ABNORMAL LOW (ref 80.0–100.0)

## 2015-02-18 LAB — GLUCOSE, CAPILLARY
GLUCOSE-CAPILLARY: 109 mg/dL — AB (ref 65–99)
Glucose-Capillary: 115 mg/dL — ABNORMAL HIGH (ref 65–99)
Glucose-Capillary: 125 mg/dL — ABNORMAL HIGH (ref 65–99)
Glucose-Capillary: 134 mg/dL — ABNORMAL HIGH (ref 65–99)
Glucose-Capillary: 136 mg/dL — ABNORMAL HIGH (ref 65–99)
Glucose-Capillary: 150 mg/dL — ABNORMAL HIGH (ref 65–99)

## 2015-02-18 MED ORDER — DILTIAZEM 12 MG/ML ORAL SUSPENSION
60.0000 mg | Freq: Four times a day (QID) | ORAL | Status: DC
  Administered 2015-02-18 – 2015-02-20 (×8): 60 mg
  Filled 2015-02-18 (×12): qty 6

## 2015-02-18 MED ORDER — METHADONE HCL 10 MG PO TABS
20.0000 mg | ORAL_TABLET | Freq: Four times a day (QID) | ORAL | Status: DC
  Administered 2015-02-18 – 2015-02-21 (×11): 20 mg via ORAL
  Filled 2015-02-18 (×13): qty 2

## 2015-02-18 MED ORDER — LIDOCAINE HCL (PF) 1 % IJ SOLN
INTRAMUSCULAR | Status: AC
  Administered 2015-02-18: 1 mL
  Filled 2015-02-18: qty 5

## 2015-02-18 MED ORDER — POTASSIUM CHLORIDE 20 MEQ/15ML (10%) PO SOLN
40.0000 meq | ORAL | Status: AC
  Administered 2015-02-18 (×2): 40 meq via ORAL
  Filled 2015-02-18 (×2): qty 30

## 2015-02-18 MED ORDER — DILTIAZEM 12 MG/ML ORAL SUSPENSION
60.0000 mg | Freq: Four times a day (QID) | ORAL | Status: DC
Start: 2015-02-18 — End: 2015-02-18
  Filled 2015-02-18 (×4): qty 6

## 2015-02-18 NOTE — Progress Notes (Signed)
RT placed pt on ATC. Patient tolerating well at this time. Vital signs stable at this time. No complications. RT will continue to monitor.

## 2015-02-18 NOTE — Care Management Note (Signed)
**Note De-Identified  Obfuscation** Case Management Note  Patient Details  Name: Adam Benson MRN: [id number] Date of Birth: 04/09/1961  Subjective/Objective:   Patient lives at home alone.  Has father who appears to have arthritis and walks with cane, who lives fairly close to son.  Patient has children but are out of state.  Father agrees that patient will need rehab prior to discharge.  Patient trached on 02-17-15, awake and alert.  Today - 9-21 on trach collar doing well.  PCCM physician states will wean off vent and be on just trach collar fairly soon.  Planning for tx to SDU today if bed available.                  Action/Plan:   Expected Discharge Date:                  Expected Discharge Plan:  Skilled Nursing Facility  In-House Referral:  Clinical Social Work  Discharge planning Services  CM Consult  Post Acute Care Choice:    Choice offered to:     DME Arranged:    DME Agency:     HH Arranged:    HH Agency:     Status of Service:  In process, will continue to follow  Medicare Important Message Given:    Date Medicare IM Given:    Medicare IM give by:    Date Additional Medicare IM Given:    Additional Medicare Important Message give by:     If discussed at Long Length of Stay Meetings, dates discussed:    Additional Comments:  Vangie Bicker, RN 02/18/2015, 1:48 PM

## 2015-02-18 NOTE — Progress Notes (Signed)
PULMONARY / CRITICAL CARE MEDICINE   Name: Adam Benson MRN: [id number] DOB: [birth year]    ADMISSION DATE:  02/01/2015 CONSULTATION DATE:  02/01/2015  REFERRING MD :  [facility]   CHIEF COMPLAINT:  Severe CAP  INITIAL PRESENTATION: 54 y/o M transferred from [facility] with dyspnea, weight gain, LE edema.  Failed bipap, intubated & transferred to [facility].  Found to have PNA with ARDS. ICU course complicated by atrial fibrillation with RVR requiring cardizem/amio gtts, bradycardia, volume overload and poor ventilator synchrony requiring vasopressors.    STUDIES:  9/04  Portable CXR - bilateral opacities. ETT about 6 cm above carina.  SIGNIFICANT EVENTS: 9/04  Intubated 9/04  transferred from OSH 9/05  some brady from Cardizem, amio 9/06  lasix started 9/07  fib rvr, neg 4.6 liters 9/08  more rvr, neg 4 liters 9/09  FOB. Purulent secretions. Fever 103 9/16 Repeat FOB. Purulent secretions lavaged and cleaned out.  9/20 trach placed, fib rvr  SUBJECTIVE: Cardizem drip, amio required  VITAL SIGNS: Temp:  [101.2 F (38.4 C)-102.7 F (39.3 C)] 101.6 F (38.7 C) (09/21 0000) Pulse Rate:  [25-159] 105 (09/21 0700) Resp:  [11-29] 11 (09/21 0700) BP: (83-145)/(44-123) 99/67 mmHg (09/21 0700) SpO2:  [88 %-100 %] 93 % (09/21 0700) FiO2 (%):  [80 %-100 %] 80 % (09/21 0329) Weight:  [162.841 kg (359 lb)] 162.841 kg (359 lb) (09/21 0500)   HEMODYNAMICS:    VENTILATOR SETTINGS: Vent Mode:  [-] PRVC FiO2 (%):  [80 %-100 %] 80 % Set Rate:  [16 bmp-20 bmp] 20 bmp Vt Set:  [470 mL] 470 mL PEEP:  [5 cmH20] 5 cmH20 Plateau Pressure:  [17 cmH20-20 cmH20] 20 cmH20   INTAKE / OUTPUT:  Intake/Output Summary (Last 24 hours) at 02/18/15 0836 Last data filed at 02/18/15 0700  Gross per 24 hour  Intake 6352.62 ml  Output   3325 ml  Net 3027.62 ml    PHYSICAL EXAMINATION: General: sedated  Neuro: perr, rass 0 HEENT: Moist mucus membranes, trach PULM: coarse  CV: S1 S2,  Irregular GI: Soft, + BS, no r/g Extremities: edema unchanged  LABS:  CBC  Recent Labs Lab 02/15/15 0421 02/16/15 0433 02/17/15 0420  WBC 12.4* 10.5 13.0*  HGB 10.5* 10.3* 9.8*  HCT 38.9* 38.6* 35.7*  PLT 663* 650* 663*   Coag's  Recent Labs Lab 02/16/15 0812  APTT 28  INR 1.24   BMET  Recent Labs Lab 02/16/15 1500 02/17/15 0420 02/18/15 0550  NA 152* 149* 148*  K 3.7 3.9 3.4*  CL 105 102 102  CO2 39* 41* 42*  BUN 33* 36* 37*  CREATININE 1.07 1.29* 1.17  GLUCOSE 140* 189* 146*   Electrolytes  Recent Labs Lab 02/12/15 0411  02/14/15 0415  02/16/15 1500 02/17/15 0420 02/18/15 0550  CALCIUM 8.2*  < > 8.7*  < > 9.0 8.6* 8.4*  MG 2.3  --  2.2  --   --   --   --   PHOS 5.5*  --  2.7  --   --   --   --   < > = values in this interval not displayed.   Sepsis Markers  Recent Labs Lab 02/12/15 0411 02/13/15 0439  PROCALCITON 0.19 0.16   ABG  Recent Labs Lab 02/14/15 0440 02/16/15 1207 02/17/15 0835  PHART 7.373 7.460* 7.407  PCO2ART 70.4* 55.8* 69.1*  PO2ART 115* 81.0 148.0*   Liver Enzymes  Recent Labs Lab 02/12/15 0411 02/17/15 0420  AST 20 23  ALT 16* 13*  ALKPHOS 36* 37*  BILITOT 0.4 0.5  ALBUMIN 2.3* 2.3*   Cardiac Enzymes No results for input(s): TROPONINI, PROBNP in the last 168 hours. Glucose  Recent Labs Lab 02/17/15 0356 02/17/15 0831 02/17/15 1204 02/17/15 1538 02/17/15 1951 02/18/15 0008  GLUCAP 192* 109* 118* 120* 111* 115*    Imaging Dg Abd 1 View  02/17/2015   CLINICAL DATA:  NG tube placement.  EXAM: ABDOMEN - 1 VIEW  COMPARISON:  CT 01/26/2011  FINDINGS: Enteric tube is present with tip just right of midline in the upper abdomen likely over the distal stomach. There are multiple air-filled loops of large and small bowel. There is a mildly dilated small bowel loop in the left upper quadrant measuring 3.5 cm. There is a 9 x 23 mm rectangular density over the pelvis projected over the bladder just left of  midline as cannot exclude a foreign body.  IMPRESSION: Several air-filled loops of large and small bowel with mildly dilated air-filled small bowel loop in the left upper quadrant. Findings may be due to ileus versus early/partial small bowel obstruction.  Enteric tube with tip just right of midline in the upper abdomen likely over the distal stomach.  Rectangular density just left of midline over the pelvis in the region of the bladder as cannot exclude a foreign body. Recommend clinical correlation.   Electronically Signed   By: Elberta Fortis M.D.   On: 02/17/2015 15:35   Dg Chest Port 1 View  02/18/2015   CLINICAL DATA:  Adult respiratory distress syndrome.  EXAM: PORTABLE CHEST - 1 VIEW  COMPARISON:  02/17/2015  FINDINGS: Endotracheal tube removed and tracheostomy tube replaced. The tracheostomy tube projects of the tracheal air shadow.  Nasogastric tube removed. A feeding tube has been placed and extends down towards the stomach. Distal margin not included.  Right IJ line tip:  SVC.  Given differences in lung volumes, the bilateral interstitial and airspace opacities appear stable. Moderate enlargement of the cardiopericardial silhouette.  Degenerative arthropathy of the left shoulder.  IMPRESSION: 1. Replacement of the endotracheal tube by a tracheostomy tube, and replacement of the nasogastric tube by a feeding tube. 2. Stable bilateral airspace opacities in the lungs.   Electronically Signed   By: Gaylyn Rong M.D.   On: 02/18/2015 08:07     ASSESSMENT / PLAN:  PULMONARY OETT 9/4 >> A: Acute on chronic hypoxic respiratory failure - in the setting of severe CAP / ARDS  Severe community acquired pneumonia Severe ARDS - improved with diuresis remains wit infiltrates Previous trach for same- just decannulated in march/april.  Re trach 9/20 P:  Consider trach collar pcxr increased infiltrates with pos 2.3 liters consider neg balance as now RVR improved  CARDIOVASCULAR R IJ CVL 9/4 >>  9/9 L IJ CVL 9/9 >>  A:  Septic Shock - in setting of CAP Elevated BNP History of atrial fibrillation -rvr Suspected diastolic congestive heart failure P:  Continue Cardizem and AMIo drips Consider re attempt, 60 q6h, then tapper drip Keep amio load Tele monitoring  ASA 325 mg QD  RENAL A:  Chronic Kidney Disease III ARDS Hypernatremia  improved P:   Trend BMP / UOP  Keep free water Repeat chem am Lasix hold, likely restart in am k supp  GASTROINTESTINAL A:   History of PUD -  December 2015 Constipation - resolved.  P:    Protonix via tube daily Cont TF Likely to need peg, assess weaning success  HEMATOLOGIC A:   Leukocytosis Hemoconcentration Mild Anemia  P:  SCDs / Heparin for DVT prophylaxis maintain This patients CHA2DS2-VASc Score and unadjusted Ischemic Stroke Rate (% per year) is equal to 0.6 % stroke rate/year from a score of 1  Above score calculated as 1 point each if present [CHF, HTN, DM, Vascular=MI/PAD/Aortic Plaque, Age if 65-74, or Male] Above score calculated as 2 points each if present [Age > 75, or Stroke/TIA/TE]   INFECTIOUS A:   Severe CAP Likley contamination BC Spiking fever, missing organism Suspect CDiff Temp spike 9/20 P:   Urine Leg Ag 9/4 >> pending Urine Strep Ag 9/4 >> negative Respiratory Viral PCR 9/4 >> neg  BCx2 9/4 >>1/2 GPC >> suspected contaminant  UC 9/4 >> Sputum 9/4 >>normal flora  BCx2 9/6 >> NEG BCx2 9/9 >>  BAL / FOB 9/9 >> GPC's pairs/clusters >>  9/18 BAl bronch>>>  Abx: Vancomycin 9/4 >> 9/9 Aztreonam 9/4 >>9 /7 Levaquin 9/4 >> 9/11 Gent 9/7 >> 9/9  Rocephin 9/3 >> 9/3 (given at OSH) Azithromycin 9/3 >> 9/3 (given at OSH)  Linezolid 9/4 >>off Primaxin 9/4>>off Flagyl 9/14>> PO Vanco 9/14  Send repeat UA, bal from bronch Assess if can dc line If spike further ,. Re add imipenem   Flagyl dc PO Vanco for C diff.maintain  Fever UA neg Cdiff? Dc line today Dc  foley  ENDOCRINE A:   Hyperglycemia  P:   SSI  NEUROLOGIC A:   Agitated Delirium - in the setting of ARDS/CAP. Improved.  P:   RASS goal: 0 fentanyl drip - high for days Add methadone 20 q6h (if qtc less 500) Versed prn fent to 400, with trach hope can reduce   FAMILY  - Updates: no family available 9/18 - Inter-disciplinary family meet or Palliative Care meeting due by:  9/11  Ccm time 30 min   [name]. Tyson Alias, MD, FACP Pgr: [phone] [place] [facility]

## 2015-02-18 NOTE — Op Note (Signed)
PCCM Video Bronchoscopy Procedure Note  The patient was informed of the risks (including but not limited to bleeding, infection, respiratory failure, lung injury, tooth/oral injury) and benefits of the procedure and gave consent, see chart.  Indication: Acute respiratory failure, tracheostomy dislodged  Post Procedure Diagnosis: Same  Location: 2M09  Condition pre procedure: Critically ill, on vent  Medications for procedure: fentanyl gtt  Procedure description: The bronchoscope was introduced through the tracheostomy stoma and passed to the carina.  Airway exam revealed thick mucus in the trachea.  The scope was used to place the XLT tracheostomy, #6 size.  Procedures performed: Emergent airway placement  Specimens sent: none  Condition post procedure: stable on vent  EBL: none  Complications: none  Heber Melvin, MD [place] [facility] Pager: [phone] Cell: [phone] After 3pm or if no response, call [phone]

## 2015-02-18 NOTE — Progress Notes (Signed)
[facility] PCCM  Called emergently from home to bedside for dislodged trach  Tracheostomy replaced with XLT, see notes  Patient placed back on ventilator, same settings as prior  Bilateral air entry, O2 saturation 100%  CXR ordered  Additional CC time in addition to procedure 40 minutes  Heber [place], MD [place] [facility] Pager: [phone] Cell: [phone] After 3pm or if no response, call [phone]

## 2015-02-18 NOTE — Evaluation (Signed)
Physical Therapy Evaluation Patient Details Name: Adam Benson MRN: [id number] DOB: 1961/03/29 Today's Date: 02/18/2015   History of Present Illness  Pt is a 54 y/o male who was transferred from [facility] with dyspnea, weight gain and LE edema. He failed BiPap, was intubated 9/4 and transferred to [facility]. He was found to have CAP with ARDS. ICU couse has been complicated by a-fib with RVR and bradycardia.   Clinical Impression  Pt admitted with above diagnosis. Pt currently with functional limitations due to the deficits listed below (see PT Problem List). At the time of PT eval pt was able to perform transfers with +2 to +3 assistance for physical assist and management of multiples lines and tubes. With mobility to EOB HR increased into the 150's and pt had difficulty breathing. Sats began to drop into the high 70's and pt was returned to supine. Pt will benefit from skilled PT to increase their independence and safety with mobility to allow discharge to the venue listed below.       Follow Up Recommendations Home health PT;Supervision for mobility/OOB    Equipment Recommendations  Rolling walker with 5" wheels    Recommendations for Other Services Rehab consult     Precautions / Restrictions Precautions Precautions: Fall Precaution Comments: Trach, Vent, NG tube, Foley, Rectal tube, Multiple IV lines Restrictions Weight Bearing Restrictions: No      Mobility  Bed Mobility Overal bed mobility: Needs Assistance;+2 for physical assistance Bed Mobility: Supine to Sit;Sit to Supine     Supine to sit: Max assist;+2 for physical assistance Sit to supine: Max assist;+2 for physical assistance   General bed mobility comments: +3 utilized for management of lines and tubes when getting back to bed. Assist for all aspects of bed mobility required. When transitioning to EOB HR increased into 150's.   Transfers                 General transfer comment: Unable to attempt at  this time.   Ambulation/Gait                Stairs            Wheelchair Mobility    Modified Rankin (Stroke Patients Only)       Balance Overall balance assessment: Needs assistance Sitting-balance support: Feet supported;Bilateral upper extremity supported Sitting balance-Leahy Scale: Poor Sitting balance - Comments: Requires UE support and/or assist from therapist to maintain sitting balance.                                      Pertinent Vitals/Pain Pain Assessment: No/denies pain    Home Living Family/patient expects to be discharged to:: Private residence Living Arrangements: Parent Available Help at Discharge: Family             Additional Comments: Pt trached and on vent - communication was poor and history info is unclear at this time.     Prior Function           Comments: Unclear at this time what PLOF was     Hand Dominance        Extremity/Trunk Assessment   Upper Extremity Assessment: Defer to OT evaluation (Decreased grip bilaterally)           Lower Extremity Assessment: Overall WFL for tasks assessed (For in bed/EOB activity. Did not attempt standing yet. )      Cervical /  Trunk Assessment: Normal  Communication   Communication: Tracheostomy  Cognition Arousal/Alertness: Awake/alert Behavior During Therapy: WFL for tasks assessed/performed Overall Cognitive Status: Within Functional Limits for tasks assessed (Difficult to assess due to trach, however likely [facility])                      General Comments General comments (skin integrity, edema, etc.): Vent frequently becoming disconnected from trach with mobility. Sats decreased into the 70's at times and mobility was not able to be progressed.     Exercises        Assessment/Plan    PT Assessment Patient needs continued PT services  PT Diagnosis Difficulty walking;Generalized weakness   PT Problem List Decreased strength;Decreased range  of motion;Decreased activity tolerance;Decreased balance;Decreased mobility;Decreased knowledge of use of DME;Decreased safety awareness;Decreased knowledge of precautions;Cardiopulmonary status limiting activity  PT Treatment Interventions DME instruction;Gait training;Stair training;Functional mobility training;Therapeutic activities;Therapeutic exercise;Neuromuscular re-education;Patient/family education   PT Goals (Current goals can be found in the Care Plan section) Acute Rehab PT Goals Patient Stated Goal: Pt did not state goals at this time.  PT Goal Formulation: With patient Time For Goal Achievement: 02/25/15 Potential to Achieve Goals: Good    Frequency Min 3X/week   Barriers to discharge Other (comment) (Unknown home environment/family support)      Co-evaluation               End of Session Equipment Utilized During Treatment: Oxygen Activity Tolerance: Patient limited by fatigue;Treatment limited secondary to medical complications (Comment) (Increased HR and decreased O2 sats) Patient left: in bed;with call bell/phone within reach;with nursing/sitter in room Nurse Communication: Mobility status         Time: 0922-1008 PT Time Calculation (min) (ACUTE ONLY): 46 min   Charges:   PT Evaluation $Initial PT Evaluation Tier I: 1 Procedure PT Treatments $Therapeutic Activity: 23-37 mins   PT G Codes:        Conni Slipper 03-10-15, 11:08 AM   Conni Slipper, PT, DPT Acute Rehabilitation Services Pager: [phone]

## 2015-02-18 NOTE — Procedures (Signed)
[facility] PCCM Procedure note: Emergent Tracheostomy exchange  Indication: Sudden loss of airway, desaturation to 70%, no volumes on vent  Procedure description: I came to the ICU and found the patient to be breathing quickly through a bag mask provided by RT.  The small, original tracheostomy was clearly dislodged.  This was removed and using a bronchoscope I was able to pass through the stoma into the trachea.  The larger, XLT tracheostomy was then placed without difficulty into the trachea. Inner cannula placed and end tidal CO2 detector showed color change.    The skin was then cleaned and prepped with chlorhexadine.  1% buffered lidocaine was used for anesthesia intradermally.  Two sutures were placed to secure the tracheostomy.  No complications  Heber [place], MD [place] [facility]M Pager: [phone] Cell: [phone] After 3pm or if no response, call [phone]

## 2015-02-18 NOTE — Progress Notes (Signed)
Patient placed on vent with previous vent settings due to desaturation issues. No complications. Vital signs stable at this time. Patient tolerating well. RT will continue to monitor.

## 2015-02-18 NOTE — Progress Notes (Signed)
Fentanyl wasted down sink d/t new line change.  Wasted with Mariana Kaufman, RN

## 2015-02-19 ENCOUNTER — Inpatient Hospital Stay (HOSPITAL_COMMUNITY): Payer: Medicaid Other

## 2015-02-19 LAB — CBC
HEMATOCRIT: 34.9 % — AB (ref 39.0–52.0)
HEMOGLOBIN: 9.7 g/dL — AB (ref 13.0–17.0)
MCH: 21.6 pg — ABNORMAL LOW (ref 26.0–34.0)
MCHC: 27.8 g/dL — ABNORMAL LOW (ref 30.0–36.0)
MCV: 77.6 fL — AB (ref 78.0–100.0)
Platelets: 514 10*3/uL — ABNORMAL HIGH (ref 150–400)
RBC: 4.5 MIL/uL (ref 4.22–5.81)
RDW: 24.6 % — AB (ref 11.5–15.5)
WBC: 17.7 10*3/uL — AB (ref 4.0–10.5)

## 2015-02-19 LAB — COMPREHENSIVE METABOLIC PANEL
ALBUMIN: 2.3 g/dL — AB (ref 3.5–5.0)
ALK PHOS: 36 U/L — AB (ref 38–126)
ALT: 13 U/L — ABNORMAL LOW (ref 17–63)
AST: 24 U/L (ref 15–41)
Anion gap: 6 (ref 5–15)
BILIRUBIN TOTAL: 0.5 mg/dL (ref 0.3–1.2)
BUN: 25 mg/dL — AB (ref 6–20)
CO2: 38 mmol/L — ABNORMAL HIGH (ref 22–32)
Calcium: 8.2 mg/dL — ABNORMAL LOW (ref 8.9–10.3)
Chloride: 103 mmol/L (ref 101–111)
Creatinine, Ser: 0.96 mg/dL (ref 0.61–1.24)
GFR calc Af Amer: >60 mL/min (ref >60)
GFR calc non Af Amer: >60 mL/min (ref >60)
GLUCOSE: 139 mg/dL — AB (ref 65–99)
POTASSIUM: 3.6 mmol/L (ref 3.5–5.1)
Sodium: 147 mmol/L — ABNORMAL HIGH (ref 135–145)
TOTAL PROTEIN: 5 g/dL — AB (ref 6.5–8.1)

## 2015-02-19 LAB — BASIC METABOLIC PANEL
Anion gap: 10 (ref 5–15)
BUN: 28 mg/dL — AB (ref 6–20)
CO2: 37 mmol/L — AB (ref 22–32)
Calcium: 8.4 mg/dL — ABNORMAL LOW (ref 8.9–10.3)
Chloride: 99 mmol/L — ABNORMAL LOW (ref 101–111)
Creatinine, Ser: 1 mg/dL (ref 0.61–1.24)
GFR calc Af Amer: >60 mL/min (ref >60)
GLUCOSE: 112 mg/dL — AB (ref 65–99)
POTASSIUM: 4 mmol/L (ref 3.5–5.1)
Sodium: 146 mmol/L — ABNORMAL HIGH (ref 135–145)

## 2015-02-19 LAB — MAGNESIUM: Magnesium: 2.3 mg/dL (ref 1.7–2.4)

## 2015-02-19 LAB — GLUCOSE, CAPILLARY
GLUCOSE-CAPILLARY: 122 mg/dL — AB (ref 65–99)
GLUCOSE-CAPILLARY: 127 mg/dL — AB (ref 65–99)
GLUCOSE-CAPILLARY: 106 mg/dL — AB (ref 65–99)
Glucose-Capillary: 114 mg/dL — ABNORMAL HIGH (ref 65–99)
Glucose-Capillary: 121 mg/dL — ABNORMAL HIGH (ref 65–99)
Glucose-Capillary: 156 mg/dL — ABNORMAL HIGH (ref 65–99)

## 2015-02-19 MED ORDER — FENTANYL CITRATE (PF) 100 MCG/2ML IJ SOLN
50.0000 ug | INTRAMUSCULAR | Status: DC | PRN
  Administered 2015-02-19 (×2): 50 ug via INTRAVENOUS
  Filled 2015-02-19 (×2): qty 2

## 2015-02-19 MED ORDER — VANCOMYCIN 50 MG/ML ORAL SOLUTION
500.0000 mg | Freq: Four times a day (QID) | ORAL | Status: DC
  Administered 2015-02-19 – 2015-02-20 (×7): 500 mg via ORAL
  Filled 2015-02-19 (×12): qty 10

## 2015-02-19 MED ORDER — FUROSEMIDE 10 MG/ML IJ SOLN
60.0000 mg | Freq: Three times a day (TID) | INTRAMUSCULAR | Status: DC
  Administered 2015-02-19 – 2015-02-20 (×4): 60 mg via INTRAVENOUS
  Filled 2015-02-19 (×4): qty 6

## 2015-02-19 MED ORDER — VANCOMYCIN HCL 10 G IV SOLR
1250.0000 mg | Freq: Two times a day (BID) | INTRAVENOUS | Status: DC
  Administered 2015-02-20 – 2015-02-23 (×7): 1250 mg via INTRAVENOUS
  Filled 2015-02-19 (×8): qty 1250

## 2015-02-19 MED ORDER — SODIUM CHLORIDE 0.9 % IV BOLUS (SEPSIS)
1000.0000 mL | Freq: Once | INTRAVENOUS | Status: AC
  Administered 2015-02-19: 1000 mL via INTRAVENOUS

## 2015-02-19 MED ORDER — MEROPENEM 1 G IV SOLR
1.0000 g | Freq: Three times a day (TID) | INTRAVENOUS | Status: DC
  Administered 2015-02-19 – 2015-02-25 (×17): 1 g via INTRAVENOUS
  Filled 2015-02-19 (×19): qty 1

## 2015-02-19 MED ORDER — AMIODARONE IV BOLUS ONLY 150 MG/100ML
150.0000 mg | Freq: Once | INTRAVENOUS | Status: AC
  Administered 2015-02-19: 150 mg via INTRAVENOUS
  Filled 2015-02-19: qty 100

## 2015-02-19 MED ORDER — VANCOMYCIN HCL 10 G IV SOLR
2500.0000 mg | Freq: Once | INTRAVENOUS | Status: AC
  Administered 2015-02-19: 2500 mg via INTRAVENOUS
  Filled 2015-02-19: qty 2500

## 2015-02-19 MED ORDER — POTASSIUM CHLORIDE 20 MEQ/15ML (10%) PO SOLN
40.0000 meq | Freq: Once | ORAL | Status: AC
  Administered 2015-02-19: 40 meq via ORAL
  Filled 2015-02-19: qty 30

## 2015-02-19 NOTE — Progress Notes (Signed)
Transported pt from 2M09 to 2C01 on ventilator. Pt stable throughout with no complications. RT will continue to monitor.

## 2015-02-19 NOTE — Progress Notes (Signed)
eLink Physician-Brief Progress Note Patient Name: Adam Benson DOB: [birth year] MRN: [id number]   Date of Service  02/19/2015  HPI/Events of Note  Multiple issues: 1. Remains in AFIB w/RVR. BMP and Mg++ level not sent yet. 2. Pain.  eICU Interventions  Will order: 1. Amiodarone 150 mg IV over 10 minutes now. 2. Await BMP and Mg++ level. 3. Fentanyl 50 mg IV Q 2 hour PRN pain.     Intervention Category Major Interventions: Arrhythmia - evaluation and management Intermediate Interventions: Pain - evaluation and management  Sommer,Steven Eugene 02/19/2015, 6:46 PM

## 2015-02-19 NOTE — Progress Notes (Signed)
E link returned call orders received

## 2015-02-19 NOTE — Progress Notes (Signed)
ANTIBIOTIC CONSULT NOTE - INITIAL  Pharmacy Consult:  Vancomycin / Merrem Indication:  Empiric for new fever  Allergies  Allergen Reactions  . Penicillins Hives and Shortness Of Breath  . Hydrochlorothiazide Other (See Comments)    hypercalemia    Patient Measurements: Height: 6' (182.9 cm) Weight: (!) 361 lb (163.749 kg) IBW/kg (Calculated) : 77.6  Vital Signs: Temp: 102.1 F (38.9 C) (09/22 2200) Temp Source: Oral (09/22 2200) BP: 123/79 mmHg (09/22 2200) Pulse Rate: 134 (09/22 2200) Intake/Output from previous day: 09/21 0701 - 09/22 0700 In: 3115.8 [I.V.:840.8; NG/GT:2175; IV Piggyback:100] Out: 2600 [Urine:2400; Stool:200] Intake/Output from this shift: Total I/O In: -  Out: 650 [Urine:650]  Labs:  Recent Labs  02/17/15 0420 02/18/15 0550 02/19/15 0235 02/19/15 1929  WBC 13.0*  --   --   --   HGB 9.8*  --   --   --   PLT 663*  --   --   --   CREATININE 1.29* 1.17 0.96 1.00   Estimated Creatinine Clearance: 138.4 mL/min (by C-G formula based on Cr of 1). No results for input(s): VANCOTROUGH, VANCOPEAK, VANCORANDOM, GENTTROUGH, GENTPEAK, GENTRANDOM, TOBRATROUGH, TOBRAPEAK, TOBRARND, AMIKACINPEAK, AMIKACINTROU, AMIKACIN in the last 72 hours.   Microbiology: Recent Results (from the past 720 hour(s))  MRSA PCR Screening     Status: None   Collection Time: 02/01/15  2:05 AM  Result Value Ref Range Status   MRSA by PCR NEGATIVE NEGATIVE Final    Comment:        The GeneXpert MRSA Assay (FDA approved for NASAL specimens only), is one component of a comprehensive MRSA colonization surveillance program. It is not intended to diagnose MRSA infection nor to guide or monitor treatment for MRSA infections.   Culture, respiratory (NON-Expectorated)     Status: None   Collection Time: 02/01/15  4:07 AM  Result Value Ref Range Status   Specimen Description TRACHEAL ASPIRATE  Final   Special Requests Normal  Final   Gram Stain   Final    NO WBC SEEN NO  SQUAMOUS EPITHELIAL CELLS SEEN NO ORGANISMS SEEN Performed at [facility]    Culture   Final    Non-Pathogenic Oropharyngeal-type Flora Isolated. Performed at [facility]    Report Status 02/03/2015 FINAL  Final  Respiratory virus panel     Status: None   Collection Time: 02/01/15  4:09 AM  Result Value Ref Range Status   Source - RVPAN NASAL SWAB  Corrected   Respiratory Syncytial Virus A Negative Negative Final   Respiratory Syncytial Virus B Negative Negative Final   Influenza A Negative Negative Final   Influenza B Negative Negative Final   Parainfluenza 1 Negative Negative Final   Parainfluenza 2 Negative Negative Final   Parainfluenza 3 Negative Negative Final   Metapneumovirus Negative Negative Final   Rhinovirus Negative Negative Final   Adenovirus Negative Negative Final    Comment: (NOTE) Performed At: [facility] [address] [place], [place] [id number] Mila Homer MD QG:[id number]   Culture, blood (routine x 2)     Status: None   Collection Time: 02/01/15  4:30 AM  Result Value Ref Range Status   Specimen Description BLOOD L IJ CVC  Final   Special Requests BOTTLES DRAWN AEROBIC AND ANAEROBIC 10CC  Final   Culture  Setup Time   Final    GRAM POSITIVE COCCI IN CLUSTERS ANAEROBIC BOTTLE ONLY CRITICAL RESULT CALLED TO, READ BACK BY AND VERIFIED WITH: J SOJACK@[id number]  02/03/15  M KELLY    Culture   Final    STAPHYLOCOCCUS SPECIES (COAGULASE NEGATIVE) THE SIGNIFICANCE OF ISOLATING THIS ORGANISM FROM A SINGLE SET OF BLOOD CULTURES WHEN MULTIPLE SETS ARE DRAWN IS UNCERTAIN. PLEASE NOTIFY THE MICROBIOLOGY DEPARTMENT WITHIN ONE WEEK IF SPECIATION AND SENSITIVITIES ARE REQUIRED.    Report Status 02/05/2015 FINAL  Final  Culture, blood (routine x 2)     Status: None   Collection Time: 02/01/15  4:49 AM  Result Value Ref Range Status   Specimen Description BLOOD LEFT HAND  Final   Special Requests BOTTLES DRAWN AEROBIC AND ANAEROBIC 5CC   Final   Culture NO GROWTH 5 DAYS  Final   Report Status 02/06/2015 FINAL  Final  Culture, blood (routine x 2)     Status: None   Collection Time: 02/03/15 12:21 PM  Result Value Ref Range Status   Specimen Description BLOOD RIGHT HAND  Final   Special Requests BOTTLES DRAWN AEROBIC AND ANAEROBIC 10CC  Final   Culture NO GROWTH 5 DAYS  Final   Report Status 02/08/2015 FINAL  Final  Culture, blood (routine x 2)     Status: None   Collection Time: 02/03/15 12:30 PM  Result Value Ref Range Status   Specimen Description BLOOD LEFT HAND  Final   Special Requests BOTTLES DRAWN AEROBIC AND ANAEROBIC 10CC  Final   Culture NO GROWTH 5 DAYS  Final   Report Status 02/08/2015 FINAL  Final  Culture, respiratory (NON-Expectorated)     Status: None   Collection Time: 02/06/15 12:25 PM  Result Value Ref Range Status   Specimen Description TRACHEAL ASPIRATE  Final   Special Requests NONE  Final   Gram Stain   Final    FEW WBC PRESENT,BOTH PMN AND MONONUCLEAR RARE SQUAMOUS EPITHELIAL CELLS PRESENT RARE GRAM POSITIVE COCCI IN PAIRS IN CLUSTERS Performed at [facility]    Culture   Final    Non-Pathogenic Oropharyngeal-type Flora Isolated. Performed at [facility]    Report Status 02/09/2015 FINAL  Final  Culture, blood (routine x 2)     Status: None   Collection Time: 02/06/15  3:30 PM  Result Value Ref Range Status   Specimen Description BLOOD RIGHT ANTECUBITAL  Final   Special Requests BOTTLES DRAWN AEROBIC AND ANAEROBIC 10CC  Final   Culture NO GROWTH 5 DAYS  Final   Report Status 02/11/2015 FINAL  Final  Culture, blood (routine x 2)     Status: None   Collection Time: 02/06/15  3:45 PM  Result Value Ref Range Status   Specimen Description BLOOD RIGHT HAND  Final   Special Requests BOTTLES DRAWN AEROBIC AND ANAEROBIC 10CC  Final   Culture NO GROWTH 5 DAYS  Final   Report Status 02/11/2015 FINAL  Final  C difficile quick scan w PCR reflex     Status: Abnormal    Collection Time: 02/10/15  5:00 PM  Result Value Ref Range Status   C Diff antigen POSITIVE (A) NEGATIVE Final   C Diff toxin NEGATIVE NEGATIVE Final   C Diff interpretation   Final    C. difficile present, but toxin not detected. This indicates colonization. In most cases, this does not require treatment. If patient has signs and symptoms consistent with colitis, consider treatment.  Pneumocystis smear by DFA     Status: None   Collection Time: 02/13/15  1:12 PM  Result Value Ref Range Status   Specimen Source-PJSRC BRONCHIAL ALVEOLAR LAVAGE  Final  Pneumocystis jiroveci Ag NEGATIVE  Final    Comment: Performed at [facility]  AFB culture with smear     Status: None (Preliminary result)   Collection Time: 02/13/15  1:31 PM  Result Value Ref Range Status   Specimen Description BRONCHIAL ALVEOLAR LAVAGE  Final   Special Requests Normal  Final   Acid Fast Smear   Final    NO ACID FAST BACILLI SEEN Performed at [facility]    Culture   Final    CULTURE WILL BE EXAMINED FOR 6 WEEKS BEFORE ISSUING A FINAL REPORT Performed at [facility]    Report Status PENDING  Incomplete  Culture, bal-quantitative     Status: None   Collection Time: 02/13/15  1:31 PM  Result Value Ref Range Status   Specimen Description BRONCHIAL ALVEOLAR LAVAGE  Final   Special Requests Normal  Final   Gram Stain   Final    MODERATE WBC PRESENT, PREDOMINANTLY PMN RARE SQUAMOUS EPITHELIAL CELLS PRESENT NO ORGANISMS SEEN Performed at [facility]   Final    2,000 COLONIES/ML Performed at [facility]    Culture   Final    Non-Pathogenic Oropharyngeal-type Flora Isolated. Performed at [facility]    Report Status 02/16/2015 FINAL  Final  Fungus Culture with Smear     Status: None (Preliminary result)   Collection Time: 02/13/15  1:31 PM  Result Value Ref Range Status   Specimen Description BRONCHIAL ALVEOLAR LAVAGE  Final    Special Requests Normal  Final   Fungal Smear   Final    NO YEAST OR FUNGAL ELEMENTS SEEN Performed at [facility]    Culture   Final    CULTURE IN PROGRESS FOR FOUR WEEKS Performed at [facility]    Report Status PENDING  Incomplete    Medical History: Past Medical History  Diagnosis Date  . Gout   . HTN (hypertension), benign   . Chronic atrial fibrillation   . Hyperlipemia   . Nerve damage     right arm  . Obesity   . Insomnia   . GERD (gastroesophageal reflux disease)   . History of pneumonia 05/2014     two month hosp stay, vent, trach   . 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 Peptic ulcer 04/2014     Assessment: 53 YOM known to Pharmacy from previous antibiotic and antifungal dosing.  He is s/p PNA treatment and to resume antibiotics for new fever.  Patient's renal function is returning to baseline.  Patient was previously sub-therapeutic on vancomycin  IV Q24H and his renal function at that time was worse than now.   Vanc 9/4 >> 9/9, resumed 9/22 >> Merrem 9/22 >> Azactam 9/4 >> 9/7 LVQ 9/4 >> 9/11 Gent 9/7 >> 9/9 Zyvox 9/9 >>9/12 Primaxin 9/9 >> 9/17 Anidula 9/12 >>  9/17 Flagyl 9/14 >9/21 PO vanc 9/14 >(9/27)  9/9 VT = 11 mcg/mL on 1750 mg q24 >>  q18 (SCr 1.5) 9/7 8-hr gent random post 7 mg/kg dose = 11.9 mcg/mL (level above nomogram) >> 120 mg q12  9/4 RVP - negative 9/4 TA - negative 9/4 BCx x2 - CoNS (1 of 2, contaminant) 9/6 BCx x2 - NEGF 9/9 BCx x2 - NGTD 9/9 TA - GPC on Gram stain 9/16 BAL Neg 9/20 UA Neg 9/22 BCx x2 - 9/22 UCx -  Goal of Therapy:  Vancomycin trough level 15-20 mcg/ml   Plan:  - Vanc  IV x 1, then  IV Q12H - Merrem 1gm IV Q8H - Monitor renal fxn, clinical progress, vanc trough prior to 4th dose    Thuy D. Laney Potash, PharmD, BCPS Pager:  [phone] 02/19/2015, 10:31 PM

## 2015-02-19 NOTE — Progress Notes (Signed)
Call to [facility] to notify that pt 's HR is still going up to 140's BP stable see flowsheet  Had amio bolus @ 1630. Pt also needs pain meds addressed.

## 2015-02-19 NOTE — Progress Notes (Addendum)
Amiodarone bolus of 150 mg as ordered. Pt to continue  On Amiodarone gtt at 16.2. HR 151 at fib. Cardizem per feeding tube given 1/2 hr early due to high HR. BP 112/68

## 2015-02-19 NOTE — Accreditation Note (Addendum)
Call to [facility] notified of HR as high as 170's sats 88 RT called increased fio2 to 70 %. Pt given 100 mcg Fentanyl IV  Elink reviewing  chart orders recieved

## 2015-02-19 NOTE — Progress Notes (Signed)
Fentanyl 50 mls wasted and witnessed by Annie Paras RN

## 2015-02-19 NOTE — Progress Notes (Signed)
eLink Physician-Brief Progress Note Patient Name: Adam Benson DOB: 10/10/1960 MRN: [id number]   Date of Service  02/19/2015  HPI/Events of Note  Fever to 102.1 F. Currently on Vancomycin PO. Has already had Tylenol.  eICU Interventions  Will order: 1. Blood Cultures X 2. 2. Urine Culture. 3. Tracheal Aspirate Culture. 4. Cooling blanket. 5. 0.9 NaCl 1 liter IV over 1 hour now.  6. Add empiric Vancomycin and Merrem (Penicillin Allergy) dosed per pharmacy for broad spectrum coverage.      Intervention Category Major Interventions: Infection - evaluation and management  Sommer,Steven Eugene 02/19/2015, 10:21 PM

## 2015-02-19 NOTE — Progress Notes (Signed)
eLink Physician-Brief Progress Note Patient Name: Adam Benson DOB: 26-May-1961 MRN: [id number]   Date of Service  02/19/2015  HPI/Events of Note  AFIB with RVR - ventricular rate = 151. Currently on an Amiodarone IV infusion.   eICU Interventions  Will order: 1. Bolus with Amiodarone 150 mg IV over 10 minutes now.  2. BMP and Magnesium level now.      Intervention Category Major Interventions: Arrhythmia - evaluation and management  Sommer,Steven Eugene 02/19/2015, 4:12 PM

## 2015-02-19 NOTE — Progress Notes (Signed)
Nutrition Follow-up  DOCUMENTATION CODES:   Morbid obesity  INTERVENTION:    Continue TF via OGT with Vital High Protein at goal rate of 75 ml/h (1800 ml per day) with Prostat 30 ml TID to provide 2100 kcals, 203 gm protein, 1505 ml free water daily.  NUTRITION DIAGNOSIS:   Inadequate oral intake related to inability to eat as evidenced by NPO status.  Ongoing  GOAL:   Provide needs based on ASPEN/SCCM guidelines  Met  MONITOR:   TF tolerance, Vent status, Labs, Weight trends  ASSESSMENT:   Presented to [facility] after a few days of increasing dyspnea, weight gain, & lower extremity edema. Patient initially tried on BiPAP. Required intubation and transferred to [facility] on 9/4.  Discussed patient in ICU rounds and with RN today. ARDS is improving with diuresis. Lurline Idol was placed on 9/20, became dislodged and required change to extra long on 9/21. May need PEG soon.   Patient is currently receiving Vital High Protein via OGT at 75 ml/h (1800 ml/day) with Prostat 30 ml TID to provide 2100 kcals, 203 gm protein, 1505 ml free water daily. Tolerating well. Also receiving 400 ml free water flushes every 6 hours. Minimal residuals noted, 10-25 ml. Patient is tolerating TF well to meet nutrition goal.   Diet Order:  Diet NPO time specified  Skin:  Reviewed, no issues  Last BM:  9/19 (loose, watery, rectal tube)  Height:   Ht Readings from Last 1 Encounters:  11/18/14 6' (1.829 m)    Weight:   Wt Readings from Last 1 Encounters:  02/19/15 361 lb (163.749 kg)    Ideal Body Weight:  80.9 kg  BMI:  Body mass index is 48.95 kg/(m^2).  Estimated Nutritional Needs:   Kcal:  4353-9122  Protein:  202 gm  Fluid:  2.5 L  EDUCATION NEEDS:   No education needs identified at this time  Molli Barrows, [place], [place], [place] Pager [phone] After Hours Pager [phone]

## 2015-02-19 NOTE — Progress Notes (Signed)
PULMONARY / CRITICAL CARE MEDICINE   Name: Adam Benson MRN: [id number] DOB: 17-May-1961    ADMISSION DATE:  02/01/2015 CONSULTATION DATE:  02/01/2015  REFERRING MD :  [facility]   CHIEF COMPLAINT:  Severe CAP  INITIAL PRESENTATION: 54 y/o M transferred from [facility] with dyspnea, weight gain, LE edema.  Failed bipap, intubated & transferred to [facility]  Found to have PNA with ARDS. ICU course complicated by atrial fibrillation with RVR requiring cardizem/amio gtts, bradycardia, volume overload and poor ventilator synchrony requiring vasopressors.    STUDIES:  9/04  Portable CXR - bilateral opacities. ETT about 6 cm above carina.  SIGNIFICANT EVENTS: 9/04  Intubated 9/04  transferred from OSH 9/05  some brady from Cardizem, amio 9/06  lasix started 9/07  fib rvr, neg 4.6 liters 9/08  more rvr, neg 4 liters 9/09  FOB. Purulent secretions. Fever 103 9/16 Repeat FOB. Purulent secretions lavaged and cleaned out.  9/20 trach placed, fib rvr 9/21- leak, dislodged trach, changed to xtra long successful  SUBJECTIVE: trach changed   VITAL SIGNS: Temp:  [99.1 F (37.3 C)-102.2 F (39 C)] 99.1 F (37.3 C) (09/22 0749) Pulse Rate:  [75-141] 88 (09/22 0800) Resp:  [11-29] 26 (09/22 0800) BP: (96-138)/(58-88) 117/60 mmHg (09/22 0800) SpO2:  [88 %-100 %] 89 % (09/22 0800) FiO2 (%):  [70 %-80 %] 70 % (09/22 0730) Weight:  [163.749 kg (361 lb)] 163.749 kg (361 lb) (09/22 0338)   HEMODYNAMICS:    VENTILATOR SETTINGS: Vent Mode:  [-] PRVC FiO2 (%):  [70 %-80 %] 70 % Set Rate:  [20 bmp] 20 bmp Vt Set:  [470 mL-570 mL] 570 mL PEEP:  [5 cmH20] 5 cmH20 Plateau Pressure:  [9 cmH20-37 cmH20] 37 cmH20   INTAKE / OUTPUT:  Intake/Output Summary (Last 24 hours) at 02/19/15 2505 Last data filed at 02/19/15 0800  Gross per 24 hour  Intake 2919.1 ml  Output   2550 ml  Net  369.1 ml    PHYSICAL EXAMINATION: General: awake Neuro: perr, rass 0 HEENT: Moist mucus membranes,  trach PULM: coarse , trach no leak CV: S1 S2, Irregular GI: Soft, + BS, no r/g Extremities: edema unchanged  LABS:  CBC  Recent Labs Lab 02/15/15 0421 02/16/15 0433 02/17/15 0420  WBC 12.4* 10.5 13.0*  HGB 10.5* 10.3* 9.8*  HCT 38.9* 38.6* 35.7*  PLT 663* 650* 663*   Coag's  Recent Labs Lab 02/16/15 0812  APTT 28  INR 1.24   BMET  Recent Labs Lab 02/17/15 0420 02/18/15 0550 02/19/15 0235  NA 149* 148* 147*  K 3.9 3.4* 3.6  CL 102 102 103  CO2 41* 42* 38*  BUN 36* 37* 25*  CREATININE 1.29* 1.17 0.96  GLUCOSE 189* 146* 139*   Electrolytes  Recent Labs Lab 02/14/15 0415  02/17/15 0420 02/18/15 0550 02/19/15 0235  CALCIUM 8.7*  < > 8.6* 8.4* 8.2*  MG 2.2  --   --   --   --   PHOS 2.7  --   --   --   --   < > = values in this interval not displayed.   Sepsis Markers  Recent Labs Lab 02/13/15 0439  PROCALCITON 0.16   ABG  Recent Labs Lab 02/16/15 1207 02/17/15 0835 02/18/15 1712  PHART 7.460* 7.407 7.429  PCO2ART 55.8* 69.1* 60.7*  PO2ART 81.0 148.0* 73.0*   Liver Enzymes  Recent Labs Lab 02/17/15 0420 02/19/15 0235  AST 23 24  ALT 13* 13*  ALKPHOS 37*  36*  BILITOT 0.5 0.5  ALBUMIN 2.3* 2.3*   Cardiac Enzymes No results for input(s): TROPONINI, PROBNP in the last 168 hours. Glucose  Recent Labs Lab 02/18/15 1121 02/18/15 1539 02/18/15 1943 02/19/15 0009 02/19/15 0357 02/19/15 0748  GLUCAP 150* 109* 136* 121* 122* 127*    Imaging Dg Chest Port 1 View  02/19/2015   CLINICAL DATA:  Tracheostomy tube.  EXAM: PORTABLE CHEST - 1 VIEW  COMPARISON:  02/18/2015 .  FINDINGS: Tracheostomy and feeding tubes are in stable position. Cardiomegaly. Diffuse dense bilateral pulmonary alveolar infiltrates are present. These are unchanged. Low lung volumes. Small pleural effusions cannot be excluded . No pneumothorax.  IMPRESSION: 1.  Tracheostomy tube and feeding tube in stable position.  2. Diffuse dense unchanged bilateral pulmonary  infiltrates. Tiny pleural effusions cannot be excluded.  3. Stable severe cardiomegaly.   Electronically Signed   By: Maisie Fus  Register   On: 02/19/2015 07:40     ASSESSMENT / PLAN:  PULMONARY OETT 9/4 >> A: Acute on chronic hypoxic respiratory failure - in the setting of severe CAP / ARDS  Severe community acquired pneumonia Severe ARDS - improved with diuresis remains wit infiltrates Previous trach for same- just decannulated in march/april.  Re trach 9/20, changed extra long 9/21 P:  Consider trach collar further on 60-70%% or lower Need lasix back to neg balance, continued pos balance increased edema, increase O 2 needs pcxr in am   CARDIOVASCULAR R IJ CVL 9/4 >> 9/9 L IJ CVL 9/9 >>  A:  Septic Shock - in setting of CAP Elevated BNP History of atrial fibrillation -rvr Suspected diastolic congestive heart failure P:  Oral cardizem Keep amio load going at maintenance, likley to oral bid 40 in am  Tele monitoring  ASA 325 mg QD lasix  RENAL A:  Chronic Kidney Disease III ARDS Hypernatremia  improved P:   bmet in am  k supp Lasix restart Free water May need d5w back at 40  GASTROINTESTINAL A:   History of PUD -  December 2015 Constipation - resolved.  P:    Protonix via tube daily Cont TF Likely to need peg, did trach collar so hold off  HEMATOLOGIC A:   Leukocytosis Hemoconcentration Mild Anemia  P:  SCDs / Heparin for DVT prophylaxis maintain This patients CHA2DS2-VASc Score and unadjusted Ischemic Stroke Rate (% per year) is equal to 0.6 % stroke rate/year from a score of 1  Above score calculated as 1 point each if present [CHF, HTN, DM, Vascular=MI/PAD/Aortic Plaque, Age if 65-74, or Male] Above score calculated as 2 points each if present [Age > 75, or Stroke/TIA/TE]   INFECTIOUS A:   Severe CAP Likley contamination BC Spiking fever, missing organism Suspect CDiff-0 he has this disease not just AG pos- cause fevers likely  Temp spike  9/20 P:   Urine Leg Ag 9/4 >> pending Urine Strep Ag 9/4 >> negative Respiratory Viral PCR 9/4 >> neg  BCx2 9/4 >>1/2 GPC >> suspected contaminant  UC 9/4 >> Sputum 9/4 >>normal flora  BCx2 9/6 >> NEG BCx2 9/9 >>  BAL / FOB 9/9 >> GPC's pairs/clusters >>  9/18 BAl bronch>>>  Abx: Vancomycin 9/4 >> 9/9 Aztreonam 9/4 >>9 /7 Levaquin 9/4 >> 9/11 Gent 9/7 >> 9/9  Rocephin 9/3 >> 9/3 (given at OSH) Azithromycin 9/3 >> 9/3 (given at OSH)  Linezolid 9/4 >>off Primaxin 9/4>>off Flagyl 9/14>> PO Vanco 9/14>>>  Send repeat UA, bal from bronch Assess if can dc line If  spike further ,. Re add imipenem  PO Vanco for C diff.maintain and increase to 500 Dc foley then re assess fever curve  ENDOCRINE A:   Hyperglycemia  P:   SSI  NEUROLOGIC A:   Agitated Delirium - in the setting of ARDS/CAP. Improved.  P:   RASS goal: 0 Keep methadone 20 q6h (if qtc less 500) Versed prn fent way down , to dc , after ,kethadone started PT active   FAMILY  - Updates: no family available 9/18 - Inter-disciplinary family meet or Palliative Care meeting due by:  9/11  Ccm time 30 min   [name]. Tyson Alias, MD, FACP Pgr: [phone] [facility]

## 2015-02-19 NOTE — Trach Care Team (Signed)
Trach Care Progression Note   Patient Details Name: Adam Benson MRN: [id number] DOB: 12-17-1960 Today's Date: 02/19/2015   Tracheostomy Assessment    Tracheostomy Shiley 6 mm Cuffed (Active)  Status Secured 02/19/2015 11:01 AM  Site Assessment Dry;Clean 02/19/2015 11:01 AM  Site Care Cleansed;Dried 02/19/2015  7:30 AM  Inner Cannula Care Changed/new 02/18/2015 11:18 PM  Ties Assessment Dry;Secure 02/19/2015 11:01 AM  Cuff pressure (cm) 28 cm 02/19/2015  4:26 AM  Emergency Equipment at bedside Yes 02/19/2015 11:01 AM     Care Needs     Respiratory Therapy Tracheostomy: Chronic trach O2 Device: Ventilator FiO2 (%): 60 % (decreased from 70 to 60 per Dr Tyson Alias) SpO2: 90 % Education: Not applicable    Speech Language Pathology      Physical Therapy PT Recommendation/Assessment: Patient needs continued PT services Follow Up Recommendations: Home health PT, Supervision for mobility/OOB PT equipment: Rolling walker with 5" wheels    Occupational Therapy      Nutritional Patient's Current Diet: Tube feeding Tube Feeding: Vital High Protein Tube Feeding Frequency: Continuous Tube Feeding Strength: Full strength    Case Management/Social Work Level of patient care prior to hospitalization: Home-Self care Living status: Family (document relation) (parent) Insurance payer: Medicaid Anticipated discharge disposition: [facility] Care Team Members Present-  Tammie Readling, RT, [place], SW  [place], NP Changed to extra long trach  9/21. Continues on vent support. Weaning O2 as tolerated. Discharge plan if for [facility].          [place], Silva Bandy (scribe for team) 02/19/2015, 2:33 PM

## 2015-02-19 NOTE — Progress Notes (Signed)
Pt received from 2 M on vent. Alert - oriented  to unit

## 2015-02-19 NOTE — Progress Notes (Signed)
Pt. Seen for trach consult, all equipment at bedside, no education needed, pt. Going to [employer].

## 2015-02-20 DIAGNOSIS — A047 Enterocolitis due to Clostridium difficile: Secondary | ICD-10-CM

## 2015-02-20 LAB — GLUCOSE, CAPILLARY
GLUCOSE-CAPILLARY: 119 mg/dL — AB (ref 65–99)
GLUCOSE-CAPILLARY: 119 mg/dL — AB (ref 65–99)
GLUCOSE-CAPILLARY: 128 mg/dL — AB (ref 65–99)
Glucose-Capillary: 121 mg/dL — ABNORMAL HIGH (ref 65–99)
Glucose-Capillary: 141 mg/dL — ABNORMAL HIGH (ref 65–99)
Glucose-Capillary: 123 mg/dL — ABNORMAL HIGH (ref 65–99)

## 2015-02-20 LAB — BASIC METABOLIC PANEL
ANION GAP: 8 (ref 5–15)
BUN: 31 mg/dL — ABNORMAL HIGH (ref 6–20)
CALCIUM: 8.2 mg/dL — AB (ref 8.9–10.3)
CO2: 39 mmol/L — ABNORMAL HIGH (ref 22–32)
CREATININE: 0.99 mg/dL (ref 0.61–1.24)
Chloride: 103 mmol/L (ref 101–111)
Glucose, Bld: 126 mg/dL — ABNORMAL HIGH (ref 65–99)
Potassium: 3.8 mmol/L (ref 3.5–5.1)
SODIUM: 150 mmol/L — AB (ref 135–145)

## 2015-02-20 LAB — TRIGLYCERIDES: TRIGLYCERIDES: 119 mg/dL (ref <150)

## 2015-02-20 MED ORDER — FREE WATER
400.0000 mL | Status: DC
Start: 2015-02-20 — End: 2015-02-28
  Administered 2015-02-20 – 2015-02-28 (×47): 400 mL

## 2015-02-20 MED ORDER — DILTIAZEM 12 MG/ML ORAL SUSPENSION
90.0000 mg | Freq: Four times a day (QID) | ORAL | Status: DC
  Administered 2015-02-20 – 2015-02-28 (×32): 90 mg
  Filled 2015-02-20 (×36): qty 9

## 2015-02-20 MED ORDER — FUROSEMIDE 10 MG/ML IJ SOLN
40.0000 mg | Freq: Four times a day (QID) | INTRAMUSCULAR | Status: AC
  Administered 2015-02-20 (×3): 40 mg via INTRAVENOUS
  Filled 2015-02-20 (×3): qty 4

## 2015-02-20 MED ORDER — FENTANYL CITRATE (PF) 100 MCG/2ML IJ SOLN
25.0000 ug | INTRAMUSCULAR | Status: DC | PRN
  Administered 2015-02-20 – 2015-02-21 (×2): 50 ug via INTRAVENOUS
  Filled 2015-02-20 (×2): qty 2

## 2015-02-20 NOTE — Progress Notes (Signed)
Trach Team Patient Details Name: Adam Benson MRN: [id number] DOB: [birth year] Today's Date: 02/20/2015 Time:  -     Briefly reviewed pt's chart as part of trach team. FiO2 today is at 80%, above criteria for speaking valve at current phase. Will monitor.    Breck Coons [place].Ed [employer] [phone]

## 2015-02-20 NOTE — Progress Notes (Signed)
Called into room; with c/o diff. Breathing 02 sat decreased. Resp in room with breathing treatment. Patient states that he panicked. Airway  Suctioned. Pt returned to baseline about 10 minutes. E-Link  Present in room.

## 2015-02-20 NOTE — Progress Notes (Signed)
eLink Physician-Brief Progress Note Patient Name: Adam Benson DOB: [birth year] MRN: [id number]   Date of Service  02/20/2015  HPI/Events of Note  Vented pt  eICU Interventions  Fent prn pain breakthrough     Intervention Category Intermediate Interventions: Pain - evaluation and management  ALVA,RAKESH V. 02/20/2015, 8:48 PM

## 2015-02-20 NOTE — Progress Notes (Signed)
PULMONARY / CRITICAL CARE MEDICINE   Name: Adam Benson MRN: [id number] DOB: Aug 08, 1960    ADMISSION DATE:  02/01/2015 CONSULTATION DATE:  02/01/2015  REFERRING MD :  [facility]   CHIEF COMPLAINT:  Severe CAP  INITIAL PRESENTATION: 54 y/o M transferred from [facility] with dyspnea, weight gain, LE edema.  Failed bipap, intubated & transferred to [facility].  Found to have PNA with ARDS. ICU course complicated by atrial fibrillation with RVR requiring cardizem/amio gtts, bradycardia, volume overload and poor ventilator synchrony requiring vasopressors.    STUDIES:  9/04  Portable CXR - bilateral opacities. ETT about 6 cm above carina.  SIGNIFICANT EVENTS: 9/04  Intubated 9/04  transferred from OSH 9/05  some brady from Cardizem, amio 9/06  lasix started 9/07  fib rvr, neg 4.6 liters 9/08  more rvr, neg 4 liters 9/09  FOB. Purulent secretions. Fever 103 9/16 Repeat FOB. Purulent secretions lavaged and cleaned out.  9/20 trach placed, fib rvr 9/21- leak, dislodged trach, changed to xtra long successful  SUBJECTIVE: trach changed   VITAL SIGNS: Temp:  [98.6 F (37 C)-102.1 F (38.9 C)] 101.9 F (38.8 C) (09/23 1015) Pulse Rate:  [55-157] 146 (09/23 0839) Resp:  [15-39] 28 (09/23 0839) BP: (107-142)/(48-90) 108/79 mmHg (09/23 0839) SpO2:  [87 %-96 %] 93 % (09/23 0839) FiO2 (%):  [60 %-80 %] 80 % (09/23 0839) Weight:  [131.8 kg (290 lb 9.1 oz)] 131.8 kg (290 lb 9.1 oz) (09/23 0500)   HEMODYNAMICS:    VENTILATOR SETTINGS: Vent Mode:  [-] PRVC FiO2 (%):  [60 %-80 %] 80 % Set Rate:  [20 bmp] 20 bmp Vt Set:  [570 mL] 570 mL PEEP:  [5 cmH20] 5 cmH20 Plateau Pressure:  [35 cmH20] 35 cmH20   INTAKE / OUTPUT:  Intake/Output Summary (Last 24 hours) at 02/20/15 1115 Last data filed at 02/20/15 1015  Gross per 24 hour  Intake 3640.6 ml  Output   4820 ml  Net -1179.4 ml    PHYSICAL EXAMINATION: General: awake Neuro: perr, rass 0 HEENT: Moist mucus membranes,  trach PULM: coarse , trach no leak CV: S1 S2, Irregular GI: Soft, + BS, no r/g Extremities: edema unchanged  LABS:  CBC  Recent Labs Lab 02/16/15 0433 02/17/15 0420 02/19/15 2258  WBC 10.5 13.0* 17.7*  HGB 10.3* 9.8* 9.7*  HCT 38.6* 35.7* 34.9*  PLT 650* 663* 514*   Coag's  Recent Labs Lab 02/16/15 0812  APTT 28  INR 1.24   BMET  Recent Labs Lab 02/19/15 0235 02/19/15 1929 02/20/15 0307  NA 147* 146* 150*  K 3.6 4.0 3.8  CL 103 99* 103  CO2 38* 37* 39*  BUN 25* 28* 31*  CREATININE 0.96 1.00 0.99  GLUCOSE 139* 112* 126*   Electrolytes  Recent Labs Lab 02/14/15 0415  02/19/15 0235 02/19/15 1929 02/20/15 0307  CALCIUM 8.7*  < > 8.2* 8.4* 8.2*  MG 2.2  --   --  2.3  --   PHOS 2.7  --   --   --   --   < > = values in this interval not displayed.   Sepsis Markers No results for input(s): LATICACIDVEN, PROCALCITON, O2SATVEN in the last 168 hours. ABG  Recent Labs Lab 02/16/15 1207 02/17/15 0835 02/18/15 1712  PHART 7.460* 7.407 7.429  PCO2ART 55.8* 69.1* 60.7*  PO2ART 81.0 148.0* 73.0*   Liver Enzymes  Recent Labs Lab 02/17/15 0420 02/19/15 0235  AST 23 24  ALT 13* 13*  ALKPHOS 37*  36*  BILITOT 0.5 0.5  ALBUMIN 2.3* 2.3*   Cardiac Enzymes No results for input(s): TROPONINI, PROBNP in the last 168 hours. Glucose  Recent Labs Lab 02/19/15 1118 02/19/15 1620 02/19/15 1957 02/20/15 0046 02/20/15 0426 02/20/15 0742  GLUCAP 106* 156* 114* 128* 119* 119*    Imaging No results found.   ASSESSMENT / PLAN:  PULMONARY OETT 9/4 >>9/20 Trach 9/20>>> A: Acute on chronic hypoxic respiratory failure - in the setting of severe CAP / ARDS  Severe community acquired pneumonia Severe ARDS - improved with diuresis remains wit infiltrates Previous trach for same- just decannulated in march/april.  Re trach 9/20, changed extra long 9/21 P:  Start PS trials but no TC for now given high O2 demand. CXR in AM. Lasix as  below.  CARDIOVASCULAR R IJ CVL 9/4 >> 9/9 L IJ CVL 9/9 >>  A:  Septic Shock - in setting of CAP Elevated BNP History of atrial fibrillation -rvr Suspected diastolic congestive heart failure P:  Oral cardizem Keep amio load going at maintenance, likley to oral bid 40 in am  Tele monitoring  ASA 325 mg QD  RENAL A:  Chronic Kidney Disease III ARDS Hypernatremia  improved P:   BMET in AM. Replace electrolytes as indicated. Lasix 40 mg IV q6 x3 doses. Free water 250 ml q6.  GASTROINTESTINAL A:   History of PUD -  December 2015 Constipation - resolved.  P:    Protonix via tube daily Cont TF Likely to need peg, did trach collar so hold off  HEMATOLOGIC A:   Leukocytosis Hemoconcentration Mild Anemia  P:  SCDs / Heparin for DVT prophylaxis maintain This patients CHA2DS2-VASc Score and unadjusted Ischemic Stroke Rate (% per year) is equal to 0.6 % stroke rate/year from a score of 1  Above score calculated as 1 point each if present [CHF, HTN, DM, Vascular=MI/PAD/Aortic Plaque, Age if 65-74, or Male] Above score calculated as 2 points each if present [Age > 75, or Stroke/TIA/TE]   INFECTIOUS A:   Severe CAP Likley contamination BC Spiking fever, missing organism Suspect 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 Assess if can dc line If spike further ,. Re add  imipenem  PO Vanco for C diff.maintain and increased to 500 Dc foley then re assess fever curve  ENDOCRINE A:   Hyperglycemia  P:   SSI  NEUROLOGIC A:   Agitated Delirium - in the setting of ARDS/CAP. Improved.  P:   RASS goal: 0  Keep methadone 20 q6h (if qtc less 500) Versed prn D/C fentanyl PT active   FAMILY  - Updates: Patient and father updated bedside. - Inter-disciplinary family meet or Palliative Care meeting due by:  9/11  The patient is critically ill with multiple organ systems failure and requires high complexity decision  making for assessment and support, frequent evaluation and titration of therapies, application of advanced monitoring technologies and extensive interpretation of multiple databases.   Critical Care Time devoted to patient care services described in this note is  35  Minutes. This time reflects time of care of this signee Dr Jennet Maduro. This critical care time does not reflect procedure time, or teaching time or supervisory time of PA/NP/Med student/Med Resident etc but could involve care discussion time.  Rush Farmer, M.D. [facility] Pulmonary/Critical Care Medicine. Pager: [phone]. After hours pager: [phone].

## 2015-02-20 NOTE — Progress Notes (Signed)
Called to patient room for pt is distress, increased work of breathing, and decreased o2 sat.  FIO2 increased to 100% and neb treatment given.  RT will continue to monitor.

## 2015-02-20 NOTE — Progress Notes (Addendum)
PT Cancellation Note  Patient Details Name: Adam Benson MRN: [id number] DOB: [birth year]   Cancelled Treatment:    Reason Eval/Treat Not Completed: Medical issues which prohibited therapy (Pt with FiO2 to 80% and not medically appropriate at this time)  Noted evaluation, pt status living alone and current respiratory function do not feel HHPT will be feasible for D/C. Currently recommend SNF. Will continue to follow as medically appropriate.   [name] 02/20/2015, 7:27 AM [name], PT [phone]

## 2015-02-20 NOTE — Progress Notes (Signed)
Duplicate: deleted note

## 2015-02-21 ENCOUNTER — Inpatient Hospital Stay (HOSPITAL_COMMUNITY): Payer: Medicaid Other

## 2015-02-21 LAB — BASIC METABOLIC PANEL
Anion gap: 12 (ref 5–15)
BUN: 34 mg/dL — ABNORMAL HIGH (ref 6–20)
CALCIUM: 8.3 mg/dL — AB (ref 8.9–10.3)
CHLORIDE: 101 mmol/L (ref 101–111)
CO2: 39 mmol/L — AB (ref 22–32)
Creatinine, Ser: 1.08 mg/dL (ref 0.61–1.24)
GFR calc Af Amer: >60 mL/min (ref >60)
GFR calc non Af Amer: >60 mL/min (ref >60)
Glucose, Bld: 134 mg/dL — ABNORMAL HIGH (ref 65–99)
Potassium: 4.3 mmol/L (ref 3.5–5.1)
SODIUM: 152 mmol/L — AB (ref 135–145)

## 2015-02-21 LAB — URINE CULTURE: Special Requests: NORMAL

## 2015-02-21 LAB — CBC
HCT: 37.2 % — ABNORMAL LOW (ref 39.0–52.0)
Hemoglobin: 10.2 g/dL — ABNORMAL LOW (ref 13.0–17.0)
MCH: 21.4 pg — AB (ref 26.0–34.0)
MCHC: 27.4 g/dL — AB (ref 30.0–36.0)
MCV: 78.2 fL (ref 78.0–100.0)
PLATELETS: 501 10*3/uL — AB (ref 150–400)
RBC: 4.76 MIL/uL (ref 4.22–5.81)
RDW: 25 % — AB (ref 11.5–15.5)
WBC: 19.3 10*3/uL — ABNORMAL HIGH (ref 4.0–10.5)

## 2015-02-21 LAB — GLUCOSE, CAPILLARY
GLUCOSE-CAPILLARY: 106 mg/dL — AB (ref 65–99)
GLUCOSE-CAPILLARY: 125 mg/dL — AB (ref 65–99)
GLUCOSE-CAPILLARY: 147 mg/dL — AB (ref 65–99)
Glucose-Capillary: 115 mg/dL — ABNORMAL HIGH (ref 65–99)

## 2015-02-21 LAB — PHOSPHORUS: PHOSPHORUS: 3.4 mg/dL (ref 2.5–4.6)

## 2015-02-21 LAB — MAGNESIUM: MAGNESIUM: 2.2 mg/dL (ref 1.7–2.4)

## 2015-02-21 LAB — PROCALCITONIN: Procalcitonin: 0.15 ng/mL

## 2015-02-21 MED ORDER — SODIUM CHLORIDE 0.9 % IJ SOLN
10.0000 mL | INTRAMUSCULAR | Status: DC | PRN
  Administered 2015-02-23 – 2015-02-24 (×2): 10 mL
  Filled 2015-02-21 (×2): qty 40

## 2015-02-21 MED ORDER — FUROSEMIDE 10 MG/ML IJ SOLN
40.0000 mg | Freq: Four times a day (QID) | INTRAMUSCULAR | Status: AC
  Administered 2015-02-21 (×3): 40 mg via INTRAVENOUS
  Filled 2015-02-21 (×3): qty 4

## 2015-02-21 MED ORDER — ASPIRIN 325 MG PO TABS
325.0000 mg | ORAL_TABLET | Freq: Every day | ORAL | Status: DC
  Administered 2015-02-21 – 2015-03-02 (×10): 325 mg
  Filled 2015-02-21 (×11): qty 1

## 2015-02-21 MED ORDER — METHYLPREDNISOLONE SODIUM SUCC 40 MG IJ SOLR
40.0000 mg | Freq: Four times a day (QID) | INTRAMUSCULAR | Status: DC
  Administered 2015-02-21 – 2015-02-24 (×13): 40 mg via INTRAVENOUS
  Filled 2015-02-21 (×17): qty 1

## 2015-02-21 MED ORDER — METHADONE HCL 10 MG PO TABS
10.0000 mg | ORAL_TABLET | Freq: Four times a day (QID) | ORAL | Status: DC
  Administered 2015-02-21 – 2015-02-22 (×4): 10 mg via ORAL
  Filled 2015-02-21 (×4): qty 1

## 2015-02-21 MED ORDER — SODIUM CHLORIDE 0.9 % IV SOLN
25.0000 ug/h | INTRAVENOUS | Status: DC
  Filled 2015-02-21: qty 50

## 2015-02-21 MED ORDER — PREGABALIN 100 MG PO CAPS
200.0000 mg | ORAL_CAPSULE | Freq: Three times a day (TID) | ORAL | Status: DC
  Administered 2015-02-21 – 2015-02-28 (×22): 200 mg via ORAL
  Filled 2015-02-21 (×9): qty 2
  Filled 2015-02-21: qty 4
  Filled 2015-02-21: qty 2
  Filled 2015-02-21: qty 4
  Filled 2015-02-21 (×5): qty 2
  Filled 2015-02-21: qty 4
  Filled 2015-02-21 (×4): qty 2

## 2015-02-21 MED ORDER — FENTANYL BOLUS VIA INFUSION
50.0000 ug | INTRAVENOUS | Status: DC | PRN
Start: 2015-02-21 — End: 2015-02-21
  Filled 2015-02-21: qty 50

## 2015-02-21 MED ORDER — LEVALBUTEROL HCL 0.63 MG/3ML IN NEBU
0.6300 mg | INHALATION_SOLUTION | Freq: Four times a day (QID) | RESPIRATORY_TRACT | Status: DC
  Administered 2015-02-21 – 2015-02-28 (×27): 0.63 mg via RESPIRATORY_TRACT
  Filled 2015-02-21 (×50): qty 3

## 2015-02-21 MED ORDER — FAMOTIDINE 40 MG/5ML PO SUSR
20.0000 mg | Freq: Two times a day (BID) | ORAL | Status: DC
  Administered 2015-02-21 – 2015-03-07 (×29): 20 mg
  Filled 2015-02-21 (×33): qty 2.5

## 2015-02-21 MED ORDER — SODIUM CHLORIDE 0.9 % IJ SOLN
10.0000 mL | Freq: Two times a day (BID) | INTRAMUSCULAR | Status: DC
  Administered 2015-02-21: 20 mL
  Administered 2015-02-22 – 2015-03-01 (×13): 10 mL
  Administered 2015-03-02: 20 mL
  Administered 2015-03-03 – 2015-03-08 (×11): 10 mL
  Administered 2015-03-08: 20 mL
  Administered 2015-03-09 – 2015-03-11 (×4): 10 mL

## 2015-02-21 MED ORDER — MIDAZOLAM HCL 2 MG/2ML IJ SOLN
2.0000 mg | INTRAMUSCULAR | Status: DC | PRN
  Administered 2015-02-22 – 2015-02-27 (×4): 2 mg via INTRAVENOUS
  Filled 2015-02-21 (×4): qty 2

## 2015-02-21 NOTE — Progress Notes (Signed)
Vent changes made by MD. 

## 2015-02-21 NOTE — Progress Notes (Signed)
RT assisted transport of patient on vent to 2M12. Vitals remained stable throughout.

## 2015-02-21 NOTE — Progress Notes (Signed)
Called report to Nathanial Rancher at 2 MW. PT will transfer per bed to bed 12 with all belongings

## 2015-02-21 NOTE — Progress Notes (Signed)
Attempt to call father to obtain PICC consent.  Will call again later.

## 2015-02-21 NOTE — Progress Notes (Signed)
DR Craige Cotta here evaluating pt - noted pt is on 100 % FI02 not comfortable Sats 90 - Inc of urine 3 times since 0710. Foley cath placed per order @ [phone]  MD says he will transfer pt back to ICU HR 150's at Fib Pt had pulled Panda tube out on night shift DR Cleared tube for meds with good placement. Missed Meds given from 0600. Restarted feeding 0 residual

## 2015-02-21 NOTE — Progress Notes (Signed)
PULMONARY / CRITICAL CARE MEDICINE   Name: Adam Benson MRN: [id number] DOB: 12-02-1960    ADMISSION DATE:  02/01/2015 CONSULTATION DATE:  02/01/2015  REFERRING MD :  [facility]   CHIEF COMPLAINT:  Severe CAP  INITIAL PRESENTATION:  54 yo male from [place] with dyspnea, wt gain, edema.  Developed VDRF from PNA and ARDS, a fib with RVR.  STUDIES:  9/04 Echo > EF 50 to 55%  SIGNIFICANT EVENTS: 9/04 Intubated, transfer to [facility] 9/09 FOB, Fever 103 9/16 Repeat FOB 9/20 trach placed, fib rvr 9/21 leak, dislodged trach, changed to xtra long successful  SUBJECTIVE:  Needing increased PEEP/FiO2.  Needed to inflate trach cuff more.  Reports hx of asthma.  Difficulty with agitation overnight per RN.  VITAL SIGNS: Temp:  [99 F (37.2 C)-101.9 F (38.8 C)] 99.8 F (37.7 C) (09/24 0822) Pulse Rate:  [119-154] 147 (09/24 0822) Resp:  [16-32] 26 (09/24 0822) BP: (105-138)/(59-92) 131/86 mmHg (09/24 0822) SpO2:  [91 %-96 %] 93 % (09/24 0822) FiO2 (%):  [80 %-100 %] 100 % (09/24 0725) Weight:  [279 lb 15.8 oz (127 kg)] 279 lb 15.8 oz (127 kg) (09/24 7616)   VENTILATOR SETTINGS: Vent Mode:  [-] PRVC FiO2 (%):  [80 %-100 %] 100 % Set Rate:  [20 bmp] 20 bmp Vt Set:  [570 mL] 570 mL PEEP:  [5 cmH20] 5 cmH20   INTAKE / OUTPUT:  Intake/Output Summary (Last 24 hours) at 02/21/15 0829 Last data filed at 02/21/15 0700  Gross per 24 hour  Intake 3262.5 ml  Output   1100 ml  Net 2162.5 ml    PHYSICAL EXAMINATION: General: using some accessory muscles Neuro: RASS 0 to +2, follows commands HEENT: trach site clean PULM: b/l wheeze and crackles CV: irregular, tachycardic GI: soft, non tender Extremities: 1+ edema  CMP Latest Ref Rng 02/21/2015 02/20/2015 02/19/2015  Glucose 65 - 99 mg/dL 073(X) 106(Y) 694(W)  BUN 6 - 20 mg/dL 54(O) 27(O) 35(K)  Creatinine 0.61 - 1.24 mg/dL 0.93 8.18 2.99  Sodium 135 - 145 mmol/L 152(H) 150(H) 146(H)  Potassium 3.5 - 5.1 mmol/L 4.3 3.8 4.0   Chloride 101 - 111 mmol/L 101 103 99(L)  CO2 22 - 32 mmol/L 39(H) 39(H) 37(H)  Calcium 8.9 - 10.3 mg/dL 8.3(L) 8.2(L) 8.4(L)  Total Protein 6.5 - 8.1 g/dL - - -  Total Bilirubin 0.3 - 1.2 mg/dL - - -  Alkaline Phos 38 - 126 U/L - - -  AST 15 - 41 U/L - - -  ALT 17 - 63 U/L - - -    CBC Latest Ref Rng 02/21/2015 02/19/2015 02/17/2015  WBC 4.0 - 10.5 K/uL 19.3(H) 17.7(H) 13.0(H)  Hemoglobin 13.0 - 17.0 g/dL 10.2(L) 9.7(L) 9.8(L)  Hematocrit 39.0 - 52.0 % 37.2(L) 34.9(L) 35.7(L)  Platelets 150 - 400 K/uL 501(H) 514(H) 663(H)    CBG (last 3)   Recent Labs  02/20/15 2050 02/21/15 0053 02/21/15 0405  GLUCAP 123* 147* 115*   ABG    Component Value Date/Time   PHART 7.429 02/18/2015 1712   PCO2ART 60.7* 02/18/2015 1712   PO2ART 73.0* 02/18/2015 1712   HCO3 40.2* 02/18/2015 1712   TCO2 42 02/18/2015 1712   O2SAT 94.0 02/18/2015 1712     No results found.  LINES/TUBES: ETT 9/4 >> 9/20 R IJ CVL 9/4 >> 9/9 L IJ CVL 9/9 >>  Trach 9/20 >>  CULTURES: C diff 9/13 >> Antigen positive, toxin negative >> colonization Blood 9/22 >>  Sputum 9/23 >>  Urine 9/23 >>  ASSESSMENT / PLAN:  PULMONARY A: Acute on chronic hypoxic respiratory failure from pneumonia with ARDS. Failure to wean from vent s/p tracheostomy >> changed to XLT on 9/21. Bronchospastic 9/24 >> reports hx of asthma. P: - change to pressure control 9/24 - f/u CXR, ABG - scheduled BD's with xopenex in setting of A fib with RVR - add solumedrol 9/24  CARDIOLOGY A: A fib with RVR >> CHA2DS2-VASc score 1. Acute diastolic CHF. P: - continue cardizem, amiodarone - continue ASA - negative fluid balance as tolerated  - will have IV team place PICC line  RENAL A: CKD stage III. Hypernatremia. P: - f/u BMET - continue free water  GASTROENTEROLOGY A: Nutrition. P: - continue tube feeds - pepcid for SUP  HEMATOLOGY A: Anemia of cricital illness. P: - f/u CBC  INFECTION A: CAP with  ARDS. Septic shock >> resolved. C diff colonization. P: - Day 21 of Abx >> currently on on vancomycin/meropenem >> check procalcitonin, and if negative consider d/c Abx - d/c po vancomycin 9/24  ENDOCRINE A: Hyperglycemia. P: - SSI  NEUROLOGY A: Delirium. P: - wean off methadone - restart fentanyl gtt with prn fentanyl - restart outpt lyrica  Summary: Difficulty with agitation.  Needing increased PEEP/FiO2.  Will transfer back to ICU.  CC time 48 minutes.  Coralyn Helling, MD [facility] Pulmonary/Critical Care 02/21/2015, 9:00 AM Pager:  [phone] After 3pm call: [phone]

## 2015-02-21 NOTE — Progress Notes (Addendum)
Peripherally Inserted Central Catheter/Midline Placement  The IV Nurse has discussed with the patient and/or persons authorized to consent for the patient, the purpose of this procedure and the potential benefits and risks involved with this procedure.  The benefits include less needle sticks, lab draws from the catheter and patient may be discharged home with the catheter.  Risks include, but not limited to, infection, bleeding, blood clot (thrombus formation), and puncture of an artery; nerve damage and irregular heat beat.  Alternatives to this procedure were also discussed. Pt nonverbal due to trach.  Able to communicate with gestures and writing and mouthing words.Able to follow and ask appropriate questions. PICC/Midline Placement Documentation  PICC / Midline Double Lumen 02/21/15 PICC Right Basilic 44 cm 0 cm (Active)  Indication for Insertion or Continuance of Line Vasoactive infusions;Limited venous access - need for IV therapy >5 days (PICC only);Poor Vasculature-patient has had multiple peripheral attempts or PIVs lasting less than 24 hours 02/21/2015  2:53 PM  Exposed Catheter (cm) 0 cm 02/21/2015  2:53 PM  Site Assessment Clean;Dry;Intact 02/21/2015  2:53 PM  Lumen #1 Status Flushed;Saline locked;Blood return noted 02/21/2015  2:53 PM  Lumen #2 Status Flushed;Saline locked;Blood return noted 02/21/2015  2:53 PM  Dressing Type Transparent 02/21/2015  2:53 PM  Dressing Status Clean;Dry;Intact;Antimicrobial disc in place 02/21/2015  2:53 PM  Line Care Connections checked and tightened 02/21/2015  2:53 PM  Line Adjustment (NICU/IV Team Only) No 02/21/2015  2:53 PM  Dressing Intervention New dressing 02/21/2015  2:53 PM  Dressing Change Due 02/28/15 02/21/2015  2:53 PM       Elliot Dally 02/21/2015, 2:56 PM

## 2015-02-21 NOTE — Progress Notes (Signed)
Pt father visiting, patient now in possession of his cel phone.

## 2015-02-21 NOTE — Progress Notes (Signed)
Pt transferred per bed with all belongings to [facility] bed 12. RT & tech & RN all present for transfer Pt alert  but confused. Moves all extremities aware of why he is transferring to ICU

## 2015-02-22 ENCOUNTER — Inpatient Hospital Stay (HOSPITAL_COMMUNITY): Payer: Medicaid Other

## 2015-02-22 LAB — VANCOMYCIN, TROUGH: VANCOMYCIN TR: 15 ug/mL (ref 10.0–20.0)

## 2015-02-22 LAB — BASIC METABOLIC PANEL
Anion gap: 10 (ref 5–15)
BUN: 48 mg/dL — AB (ref 6–20)
CALCIUM: 8.2 mg/dL — AB (ref 8.9–10.3)
CO2: 40 mmol/L — ABNORMAL HIGH (ref 22–32)
CREATININE: 1.16 mg/dL (ref 0.61–1.24)
Chloride: 101 mmol/L (ref 101–111)
GFR calc Af Amer: >60 mL/min (ref >60)
GLUCOSE: 173 mg/dL — AB (ref 65–99)
POTASSIUM: 3.4 mmol/L — AB (ref 3.5–5.1)
SODIUM: 151 mmol/L — AB (ref 135–145)

## 2015-02-22 LAB — CBC
HCT: 33.7 % — ABNORMAL LOW (ref 39.0–52.0)
Hemoglobin: 9.4 g/dL — ABNORMAL LOW (ref 13.0–17.0)
MCH: 21.7 pg — ABNORMAL LOW (ref 26.0–34.0)
MCHC: 27.9 g/dL — AB (ref 30.0–36.0)
MCV: 77.6 fL — ABNORMAL LOW (ref 78.0–100.0)
PLATELETS: 377 10*3/uL (ref 150–400)
RBC: 4.34 MIL/uL (ref 4.22–5.81)
RDW: 24.5 % — AB (ref 11.5–15.5)
WBC: 16 10*3/uL — AB (ref 4.0–10.5)

## 2015-02-22 LAB — PROCALCITONIN: PROCALCITONIN: 0.1 ng/mL

## 2015-02-22 LAB — CULTURE, RESPIRATORY

## 2015-02-22 LAB — CULTURE, RESPIRATORY W GRAM STAIN: Special Requests: NORMAL

## 2015-02-22 MED ORDER — METHADONE HCL 5 MG PO TABS
5.0000 mg | ORAL_TABLET | Freq: Four times a day (QID) | ORAL | Status: DC
  Administered 2015-02-22 – 2015-02-28 (×25): 5 mg via ORAL
  Filled 2015-02-22 (×26): qty 1

## 2015-02-22 MED ORDER — FENTANYL CITRATE (PF) 2500 MCG/50ML IJ SOLN
10.0000 ug/h | INTRAMUSCULAR | Status: DC
  Administered 2015-02-22 – 2015-02-23 (×2): 100 ug/h via INTRAVENOUS
  Filled 2015-02-22 (×4): qty 50

## 2015-02-22 NOTE — Progress Notes (Signed)
PULMONARY / CRITICAL CARE MEDICINE   Name: Adam Benson MRN: [id number] DOB: [birth year]    ADMISSION DATE:  02/01/2015 CONSULTATION DATE:  02/01/2015  REFERRING MD :  [facility]   CHIEF COMPLAINT:  Severe CAP  INITIAL PRESENTATION:  54 yo male from [place] with dyspnea, wt gain, edema.  Developed VDRF from PNA and ARDS, a fib with RVR.  STUDIES:  9/04 Echo > EF 50 to 55%  SIGNIFICANT EVENTS: 9/04 Intubated, transfer to [facility] 9/09 FOB, Fever 103 9/16 Repeat FOB 9/20 trach placed, fib rvr 9/21 leak, dislodged trach, changed to xtra long successful 9/24 moved to ICU and placed on PCV  SUBJECTIVE:  Needing increased PEEP/FiO2.  Needed to inflate trach cuff more.  Reports hx of asthma.  Difficulty with agitation overnight per RN.  VITAL SIGNS: Temp:  [99.8 F (37.7 C)-101.9 F (38.8 C)] 101 F (38.3 C) (09/25 0431) Pulse Rate:  [51-150] 92 (09/25 0751) Resp:  [14-31] 29 (09/25 0751) BP: (98-136)/(56-89) 120/73 mmHg (09/25 0751) SpO2:  [92 %-100 %] 97 % (09/25 0751) FiO2 (%):  [70 %-100 %] 70 % (09/25 0751) Weight:  [276 lb 10.8 oz (125.5 kg)] 276 lb 10.8 oz (125.5 kg) (09/25 0500)   VENTILATOR SETTINGS: Vent Mode:  [-] PCV FiO2 (%):  [70 %-100 %] 70 % Set Rate:  [26 bmp] 26 bmp PEEP:  [12 cmH20] 12 cmH20 Plateau Pressure:  [26 cmH20-34 cmH20] 33 cmH20   INTAKE / OUTPUT:  Intake/Output Summary (Last 24 hours) at 02/22/15 0814 Last data filed at 02/22/15 0600  Gross per 24 hour  Intake 4106.25 ml  Output   4200 ml  Net -93.75 ml    PHYSICAL EXAMINATION: General: sedated, large cuff leak asleep Neuro: RASS 0 to +2, follows commands HEENT: trach site clean, some bloody sputum PULM: b/l wheeze and crackles, air leak CV: irregular, tachycardic GI: soft, non tender Extremities: 1+ edema  CMP Latest Ref Rng 02/22/2015 02/21/2015 02/20/2015  Glucose 65 - 99 mg/dL 132(G) 401(U) 272(Z)  BUN 6 - 20 mg/dL 36(U) 44(I) 34(V)  Creatinine 0.61 - 1.24 mg/dL 4.25 9.56  3.87  Sodium 135 - 145 mmol/L 151(H) 152(H) 150(H)  Potassium 3.5 - 5.1 mmol/L 3.4(L) 4.3 3.8  Chloride 101 - 111 mmol/L 101 101 103  CO2 22 - 32 mmol/L 40(H) 39(H) 39(H)  Calcium 8.9 - 10.3 mg/dL 8.2(L) 8.3(L) 8.2(L)  Total Protein 6.5 - 8.1 g/dL - - -  Total Bilirubin 0.3 - 1.2 mg/dL - - -  Alkaline Phos 38 - 126 U/L - - -  AST 15 - 41 U/L - - -  ALT 17 - 63 U/L - - -    CBC Latest Ref Rng 02/22/2015 02/21/2015 02/19/2015  WBC 4.0 - 10.5 K/uL 16.0(H) 19.3(H) 17.7(H)  Hemoglobin 13.0 - 17.0 g/dL 5.6(E) 10.2(L) 9.7(L)  Hematocrit 39.0 - 52.0 % 33.7(L) 37.2(L) 34.9(L)  Platelets 150 - 400 K/uL 377 501(H) 514(H)    CBG (last 3)   Recent Labs  02/21/15 0405 02/21/15 0803 02/21/15 1120  GLUCAP 115* 106* 125*   ABG    Component Value Date/Time   PHART 7.429 02/18/2015 1712   PCO2ART 60.7* 02/18/2015 1712   PO2ART 73.0* 02/18/2015 1712   HCO3 40.2* 02/18/2015 1712   TCO2 42 02/18/2015 1712   O2SAT 94.0 02/18/2015 1712     Dg Chest Port 1 View  02/22/2015   CLINICAL DATA:  Shortness of breath, ARDS  EXAM: PORTABLE CHEST 1 VIEW  COMPARISON:  02/21/2015  FINDINGS: Tracheostomy in satisfactory position.  Diffuse bilateral airspace opacities with and mild perihilar predominance, unchanged. No pleural effusion or pneumothorax.  Cardiomegaly.  Right arm PICC terminates at the cavoatrial junction.  IMPRESSION: Tracheostomy in satisfactory position.  Stable diffuse bilateral airspace opacities.   Electronically Signed   By: Charline Bills M.D.   On: 02/22/2015 07:32   Dg Chest Port 1 View  02/21/2015   CLINICAL DATA:  Confirm line placement.  EXAM: PORTABLE CHEST 1 VIEW  COMPARISON:  02/19/2015  FINDINGS: A tracheostomy tube overlies the airway. A right PICC has been placed and terminates over the lower SVC. Patient is mildly rotated to the right. Cardiomediastinal silhouette is unchanged with persistent cardiac enlargement. Diffuse bilateral pulmonary opacities have minimally improved  bilaterally in the interim. No large pleural effusion or pneumothorax is identified. Enteric tube is present with tip not visualized.  IMPRESSION: 1. Right PICC placement with tip over the lower SVC. 2. Diffuse bilateral airspace opacities, minimally improved from prior.   Electronically Signed   By: Sebastian Ache M.D.   On: 02/21/2015 15:23   Dg Abd Portable 1v  02/21/2015   CLINICAL DATA:  NG tube placement.  EXAM: PORTABLE ABDOMEN - 1 VIEW  COMPARISON:  02/17/2015  FINDINGS: The tip of a weighted enteric tube projects in the upper abdomen, slightly greater right of midline than on the prior study and likely in distal stomach. Mild gaseous distension of small and large bowel is slightly less than on the prior study. Bibasilar lung opacities are incompletely evaluated.  IMPRESSION: 1. Enteric tube tip in the region of the distal stomach. 2. Mild gaseous distension of small and large bowel, slightly decreased from prior and may reflect ileus.   Electronically Signed   By: Sebastian Ache M.D.   On: 02/21/2015 09:22    LINES/TUBES: ETT 9/4 >> 9/20 R IJ CVL 9/4 >> 9/9 L IJ CVL 9/9 >> 9/24 picc 9/24>> Trach 9/20 >>  CULTURES: C diff 9/13 >> Antigen positive, toxin negative >> colonization Blood 9/22 >>  Sputum 9/23 >>  Urine 9/23 >>  ASSESSMENT / PLAN:  PULMONARY A: Acute on chronic hypoxic respiratory failure from pneumonia with ARDS. Failure to wean from vent s/p tracheostomy >> changed to XLT on 9/21. Bronchospastic 9/24 >> reports hx of asthma. P: - change to pressure control 9/24 - f/u CXR, ABG - scheduled BD's with xopenex in setting of A fib with RVR - added solumedrol 9/24 -FOB for airway exam  CARDIOLOGY A: A fib with RVR >> CHA2DS2-VASc score 1. Acute diastolic CHF. P: - continue cardizem, amiodarone(note on high fio2, ? Change to another agent) - continue ASA - negative fluid balance as tolerated  - will have IV team place PICC line  RENAL Lab Results  Component Value  Date   CREATININE 1.16 02/22/2015   CREATININE 1.08 02/21/2015   CREATININE 0.99 02/20/2015    A: CKD stage III. Hypernatremia. P: - f/u BMET - continue free water  GASTROENTEROLOGY A: Nutrition. P: - continue tube feeds - pepcid for SUP  HEMATOLOGY A: Anemia of cricital illness. P: - f/u CBC  INFECTION A: CAP with ARDS. Septic shock >> resolved. C diff colonization. P: - Day 22 of Abx >> currently on on vancomycin/meropenem >> check procalcitonin(0.10), 9/25 will continue abx with acute resp distress, ?dc in am if improved. - d/c po vancomycin 9/24  ENDOCRINE CBG (last 3)   Recent Labs  02/21/15 0405 02/21/15 0803 02/21/15 1120  GLUCAP 115*  106* 125*     A: Hyperglycemia. P: - SSI -watch with steroids started 9/24  NEUROLOGY A: Delirium. P: - wean off methadone - restart fentanyl gtt with prn fentanyl (condider Duragesic patch) - restart outpt lyrica  Summary: Transferred to back to ICU 9/24/. Requiring high FIO2(80%) with peep 12. Sedated currently. Weaning fio2 down. Large (100-200 cc) cuff leak. CxR unchanged. May need FOB in future.  Brett Canales Minor ACNP [facility] Pager [phone] till 3 pm If no answer page [phone] 02/22/2015, 8:17 AM

## 2015-02-22 NOTE — Progress Notes (Signed)
ANTIBIOTIC CONSULT NOTE - Follow-up  Pharmacy Consult:  Vancomycin / Merrem Indication:  Empiric for new fever  Allergies  Allergen Reactions  . Penicillins Hives and Shortness Of Breath  . Hydrochlorothiazide Other (See Comments)    hypercalemia    Patient Measurements: Height: 6' (182.9 cm) Weight: 279 lb 15.8 oz (127 kg) IBW/kg (Calculated) : 77.6  Vital Signs: Temp: 100.6 F (38.1 C) (09/24 2348) Temp Source: Oral (09/24 2348) BP: 113/71 mmHg (09/25 0100) Pulse Rate: 98 (09/25 0100) Intake/Output from previous day: 09/24 0701 - 09/25 0700 In: 3141.3 [NG/GT:1741.3; IV Piggyback:1400] Out: 3500 [Urine:3500] Intake/Output from this shift: Total I/O In: 1691.3 [NG/GT:1341.3; IV Piggyback:350] Out: 900 [Urine:900]  Labs:  Recent Labs  02/19/15 1929 02/19/15 2258 02/20/15 0307 02/21/15 0308  WBC  --  17.7*  --  19.3*  HGB  --  9.7*  --  10.2*  PLT  --  514*  --  501*  CREATININE 1.00  --  0.99 1.08   Estimated Creatinine Clearance: 111.5 mL/min (by C-G formula based on Cr of 1.08).  Recent Labs  02/22/15 0030  VANCOTROUGH 15     Microbiology: Recent Results (from the past 720 hour(s))  MRSA PCR Screening     Status: None   Collection Time: 02/01/15  2:05 AM  Result Value Ref Range Status   MRSA by PCR NEGATIVE NEGATIVE Final    Comment:        The GeneXpert MRSA Assay (FDA approved for NASAL specimens only), is one component of a comprehensive MRSA colonization surveillance program. It is not intended to diagnose MRSA infection nor to guide or monitor treatment for MRSA infections.   Culture, respiratory (NON-Expectorated)     Status: None   Collection Time: 02/01/15  4:07 AM  Result Value Ref Range Status   Specimen Description TRACHEAL ASPIRATE  Final   Special Requests Normal  Final   Gram Stain   Final    NO WBC SEEN NO SQUAMOUS EPITHELIAL CELLS SEEN NO ORGANISMS SEEN Performed at [facility]    Culture   Final   Non-Pathogenic Oropharyngeal-type Flora Isolated. Performed at [facility]    Report Status 02/03/2015 FINAL  Final  Respiratory virus panel     Status: None   Collection Time: 02/01/15  4:09 AM  Result Value Ref Range Status   Source - RVPAN NASAL SWAB  Corrected   Respiratory Syncytial Virus A Negative Negative Final   Respiratory Syncytial Virus B Negative Negative Final   Influenza A Negative Negative Final   Influenza B Negative Negative Final   Parainfluenza 1 Negative Negative Final   Parainfluenza 2 Negative Negative Final   Parainfluenza 3 Negative Negative Final   Metapneumovirus Negative Negative Final   Rhinovirus Negative Negative Final   Adenovirus Negative Negative Final    Comment: (NOTE) Performed At: [facility] [address] [place], [place] [id number] Mila Homer MD NW:[id number]   Culture, blood (routine x 2)     Status: None   Collection Time: 02/01/15  4:30 AM  Result Value Ref Range Status   Specimen Description BLOOD L IJ CVC  Final   Special Requests BOTTLES DRAWN AEROBIC AND ANAEROBIC 10CC  Final   Culture  Setup Time   Final    GRAM POSITIVE COCCI IN CLUSTERS ANAEROBIC BOTTLE ONLY CRITICAL RESULT CALLED TO, READ BACK BY AND VERIFIED WITH: J SOJACK@[id number]  02/03/15 M KELLY    Culture   Final    STAPHYLOCOCCUS SPECIES (COAGULASE  NEGATIVE) THE SIGNIFICANCE OF ISOLATING THIS ORGANISM FROM A SINGLE SET OF BLOOD CULTURES WHEN MULTIPLE SETS ARE DRAWN IS UNCERTAIN. PLEASE NOTIFY THE MICROBIOLOGY DEPARTMENT WITHIN ONE WEEK IF SPECIATION AND SENSITIVITIES ARE REQUIRED.    Report Status 02/05/2015 FINAL  Final  Culture, blood (routine x 2)     Status: None   Collection Time: 02/01/15  4:49 AM  Result Value Ref Range Status   Specimen Description BLOOD LEFT HAND  Final   Special Requests BOTTLES DRAWN AEROBIC AND ANAEROBIC 5CC  Final   Culture NO GROWTH 5 DAYS  Final   Report Status 02/06/2015 FINAL  Final  Culture, blood (routine x 2)      Status: None   Collection Time: 02/03/15 12:21 PM  Result Value Ref Range Status   Specimen Description BLOOD RIGHT HAND  Final   Special Requests BOTTLES DRAWN AEROBIC AND ANAEROBIC 10CC  Final   Culture NO GROWTH 5 DAYS  Final   Report Status 02/08/2015 FINAL  Final  Culture, blood (routine x 2)     Status: None   Collection Time: 02/03/15 12:30 PM  Result Value Ref Range Status   Specimen Description BLOOD LEFT HAND  Final   Special Requests BOTTLES DRAWN AEROBIC AND ANAEROBIC 10CC  Final   Culture NO GROWTH 5 DAYS  Final   Report Status 02/08/2015 FINAL  Final  Culture, respiratory (NON-Expectorated)     Status: None   Collection Time: 02/06/15 12:25 PM  Result Value Ref Range Status   Specimen Description TRACHEAL ASPIRATE  Final   Special Requests NONE  Final   Gram Stain   Final    FEW WBC PRESENT,BOTH PMN AND MONONUCLEAR RARE SQUAMOUS EPITHELIAL CELLS PRESENT RARE GRAM POSITIVE COCCI IN PAIRS IN CLUSTERS Performed at [facility]    Culture   Final    Non-Pathogenic Oropharyngeal-type Flora Isolated. Performed at [facility]    Report Status 02/09/2015 FINAL  Final  Culture, blood (routine x 2)     Status: None   Collection Time: 02/06/15  3:30 PM  Result Value Ref Range Status   Specimen Description BLOOD RIGHT ANTECUBITAL  Final   Special Requests BOTTLES DRAWN AEROBIC AND ANAEROBIC 10CC  Final   Culture NO GROWTH 5 DAYS  Final   Report Status 02/11/2015 FINAL  Final  Culture, blood (routine x 2)     Status: None   Collection Time: 02/06/15  3:45 PM  Result Value Ref Range Status   Specimen Description BLOOD RIGHT HAND  Final   Special Requests BOTTLES DRAWN AEROBIC AND ANAEROBIC 10CC  Final   Culture NO GROWTH 5 DAYS  Final   Report Status 02/11/2015 FINAL  Final  C difficile quick scan w PCR reflex     Status: Abnormal   Collection Time: 02/10/15  5:00 PM  Result Value Ref Range Status   C Diff antigen POSITIVE (A) NEGATIVE Final   C  Diff toxin NEGATIVE NEGATIVE Final   C Diff interpretation   Final    C. difficile present, but toxin not detected. This indicates colonization. In most cases, this does not require treatment. If patient has signs and symptoms consistent with colitis, consider treatment.  Pneumocystis smear by DFA     Status: None   Collection Time: 02/13/15  1:12 PM  Result Value Ref Range Status   Specimen Source-PJSRC BRONCHIAL ALVEOLAR LAVAGE  Final   Pneumocystis jiroveci Ag NEGATIVE  Final    Comment: Performed at [facility]  [facility]  AFB culture with smear     Status: None (Preliminary result)   Collection Time: 02/13/15  1:31 PM  Result Value Ref Range Status   Specimen Description BRONCHIAL ALVEOLAR LAVAGE  Final   Special Requests Normal  Final   Acid Fast Smear   Final    NO ACID FAST BACILLI SEEN Performed at [facility]    Culture   Final    CULTURE WILL BE EXAMINED FOR 6 WEEKS BEFORE ISSUING A FINAL REPORT Performed at [facility]    Report Status PENDING  Incomplete  Culture, bal-quantitative     Status: None   Collection Time: 02/13/15  1:31 PM  Result Value Ref Range Status   Specimen Description BRONCHIAL ALVEOLAR LAVAGE  Final   Special Requests Normal  Final   Gram Stain   Final    MODERATE WBC PRESENT, PREDOMINANTLY PMN RARE SQUAMOUS EPITHELIAL CELLS PRESENT NO ORGANISMS SEEN Performed at [facility]   Final    2,000 COLONIES/ML Performed at [facility]    Culture   Final    Non-Pathogenic Oropharyngeal-type Flora Isolated. Performed at [facility]    Report Status 02/16/2015 FINAL  Final  Fungus Culture with Smear     Status: None (Preliminary result)   Collection Time: 02/13/15  1:31 PM  Result Value Ref Range Status   Specimen Description BRONCHIAL ALVEOLAR LAVAGE  Final   Special Requests Normal  Final   Fungal Smear   Final    NO YEAST OR FUNGAL ELEMENTS SEEN Performed at [facility]    Culture   Final    CULTURE IN PROGRESS FOR FOUR WEEKS Performed at [facility]    Report Status PENDING  Incomplete  Culture, blood (routine x 2)     Status: None (Preliminary result)   Collection Time: 02/19/15 10:58 PM  Result Value Ref Range Status   Specimen Description BLOOD RIGHT ARM  Final   Special Requests BOTTLES DRAWN AEROBIC AND ANAEROBIC 5CC  Final   Culture NO GROWTH 1 DAY  Final   Report Status PENDING  Incomplete  Culture, blood (routine x 2)     Status: None (Preliminary result)   Collection Time: 02/19/15 11:18 PM  Result Value Ref Range Status   Specimen Description BLOOD RIGHT ANTECUBITAL  Final   Special Requests BOTTLES DRAWN AEROBIC AND ANAEROBIC 5CC  Final   Culture NO GROWTH 1 DAY  Final   Report Status PENDING  Incomplete  Culture, respiratory (NON-Expectorated)     Status: None (Preliminary result)   Collection Time: 02/20/15 12:11 AM  Result Value Ref Range Status   Specimen Description TRACHEAL ASPIRATE  Final   Special Requests Normal  Final   Gram Stain   Final    FEW WBC PRESENT,BOTH PMN AND MONONUCLEAR RARE SQUAMOUS EPITHELIAL CELLS PRESENT NO ORGANISMS SEEN Performed at [facility]    Culture   Final    Culture reincubated for better growth Performed at [facility]    Report Status PENDING  Incomplete  Culture, Urine     Status: None   Collection Time: 02/20/15 12:31 AM  Result Value Ref Range Status   Specimen Description URINE, CLEAN CATCH  Final   Special Requests Normal  Final   Culture MULTIPLE SPECIES PRESENT, SUGGEST RECOLLECTION  Final   Report Status 02/21/2015 FINAL  Final    Assessment: 53 YOM on vancomycin and merrem (  restart Day #4) for new fever. He is s/p PNA treatment and to resume antibiotics for new fever. New cultures with no growth to date. SCr 1.08, est CrCl >100 ml/min. Good UOP.  Vancomycin trough 15 mcg/ml (therapeutic) on  IV q12h  Goal of Therapy:  Vancomycin  trough level 15-20 mcg/ml  Plan:  - Continue Vancomycin  IV Q12H - Continue Merrem 1gm IV Q8H - Monitor renal fxn, clinical progress, and vanc trough weekly  Christoper Fabian, PharmD, BCPS Clinical pharmacist, pager [phone] 02/22/2015, 1:54 AM

## 2015-02-23 ENCOUNTER — Inpatient Hospital Stay (HOSPITAL_COMMUNITY): Payer: Medicaid Other

## 2015-02-23 DIAGNOSIS — J849 Interstitial pulmonary disease, unspecified: Secondary | ICD-10-CM

## 2015-02-23 LAB — CBC
HCT: 33.9 % — ABNORMAL LOW (ref 39.0–52.0)
Hemoglobin: 9.2 g/dL — ABNORMAL LOW (ref 13.0–17.0)
MCH: 21.1 pg — AB (ref 26.0–34.0)
MCHC: 27.1 g/dL — ABNORMAL LOW (ref 30.0–36.0)
MCV: 77.9 fL — AB (ref 78.0–100.0)
PLATELETS: 281 10*3/uL (ref 150–400)
RBC: 4.35 MIL/uL (ref 4.22–5.81)
RDW: 24.8 % — AB (ref 11.5–15.5)
WBC: 16.3 10*3/uL — ABNORMAL HIGH (ref 4.0–10.5)

## 2015-02-23 LAB — BLOOD GAS, ARTERIAL
Acid-Base Excess: 14.7 mmol/L — ABNORMAL HIGH (ref 0.0–2.0)
Bicarbonate: 39.5 mEq/L — ABNORMAL HIGH (ref 20.0–24.0)
DRAWN BY: 419771
FIO2: 0.6
LHR: 26 {breaths}/min
O2 SAT: 92.5 %
PCO2 ART: 55.6 mmHg — AB (ref 35.0–45.0)
Patient temperature: 98.6
Pressure control: 22 cmH2O
TCO2: 41.2 mmol/L (ref 0–100)
pH, Arterial: 7.466 — ABNORMAL HIGH (ref 7.350–7.450)
pO2, Arterial: 67.6 mmHg — ABNORMAL LOW (ref 80.0–100.0)

## 2015-02-23 LAB — BASIC METABOLIC PANEL
Anion gap: 9 (ref 5–15)
BUN: 46 mg/dL — AB (ref 6–20)
CALCIUM: 8.5 mg/dL — AB (ref 8.9–10.3)
CO2: 39 mmol/L — ABNORMAL HIGH (ref 22–32)
CREATININE: 0.96 mg/dL (ref 0.61–1.24)
Chloride: 102 mmol/L (ref 101–111)
GFR calc Af Amer: >60 mL/min (ref >60)
GLUCOSE: 202 mg/dL — AB (ref 65–99)
POTASSIUM: 3.8 mmol/L (ref 3.5–5.1)
SODIUM: 150 mmol/L — AB (ref 135–145)

## 2015-02-23 LAB — PROCALCITONIN: Procalcitonin: <0.1 ng/mL

## 2015-02-23 LAB — MAGNESIUM: MAGNESIUM: 2.6 mg/dL — AB (ref 1.7–2.4)

## 2015-02-23 LAB — PHOSPHORUS: PHOSPHORUS: 3.6 mg/dL (ref 2.5–4.6)

## 2015-02-23 MED ORDER — INSULIN ASPART 100 UNIT/ML ~~LOC~~ SOLN
0.0000 [IU] | SUBCUTANEOUS | Status: DC
  Administered 2015-02-23: 3 [IU] via SUBCUTANEOUS
  Administered 2015-02-24 (×2): 2 [IU] via SUBCUTANEOUS
  Administered 2015-02-24: 3 [IU] via SUBCUTANEOUS
  Administered 2015-02-24: 2 [IU] via SUBCUTANEOUS
  Administered 2015-02-24 – 2015-02-27 (×20): 3 [IU] via SUBCUTANEOUS
  Administered 2015-02-28 (×3): 1 [IU] via SUBCUTANEOUS
  Administered 2015-02-28 (×2): 2 [IU] via SUBCUTANEOUS
  Administered 2015-02-28 – 2015-03-06 (×6): 1 [IU] via SUBCUTANEOUS

## 2015-02-23 NOTE — Progress Notes (Signed)
Physical Therapy Treatment Patient Details Name: Adam Benson MRN: [id number] DOB: [birth year] Today's Date: 02/23/2015    History of Present Illness Pt is a 54 y/o male who was transferred from [facility] with dyspnea, weight gain and LE edema. He failed BiPap, was intubated 9/4 and transferred to [facility]. He was found to have CAP with ARDS. ICU couse has been complicated by a-fib with RVR and bradycardia.     PT Comments    Pt progressing towards physical therapy goals. Requires +3 assist for OOB due to multiple lines/leads/tubes. Did well transitioning bed>chair, however feel a higher level of care at d/c will be required for continued progress. Recommending CIR at this time for improvement in strength and functional mobility prior to d/c home. Will continue to follow and progress as able per POC.   Follow Up Recommendations  CIR;Supervision/Assistance - 24 hour     Equipment Recommendations  Rolling walker with 5" wheels    Recommendations for Other Services Rehab consult     Precautions / Restrictions Precautions Precautions: Fall Precaution Comments: Trach, Vent, NG tube, Foley, Rectal tube, Multiple IV lines Restrictions Weight Bearing Restrictions: No    Mobility  Bed Mobility Overal bed mobility: Needs Assistance Bed Mobility: Supine to Sit     Supine to sit: Min assist     General bed mobility comments: HOB elevated. Pt able to transition to EOB with therapist assist for cueing (technique) and management of lines.   Transfers Overall transfer level: Needs assistance Equipment used: 2 person hand held assist Transfers: Sit to/from [employer] Sit to Stand: Mod assist;+2 physical assistance Stand pivot transfers: Min assist;+2 physical assistance       General transfer comment: Pt was able to power-up to full standing position and take a few pivotal steps around to the recliner chair. RN was present for management of vent lines.    Ambulation/Gait             General Gait Details: Unable at this time due to low O2 sats   Stairs            Wheelchair Mobility    Modified Rankin (Stroke Patients Only)       Balance Overall balance assessment: Needs assistance Sitting-balance support: Feet supported;No upper extremity supported Sitting balance-Leahy Scale: Fair     Standing balance support: Bilateral upper extremity supported;During functional activity Standing balance-Leahy Scale: Poor Standing balance comment: Requires support to maintain standing balance. Pt was not able to achieve full standing position with upright posture.                    Cognition Arousal/Alertness: Awake/alert Behavior During Therapy: WFL for tasks assessed/performed Overall Cognitive Status: Within Functional Limits for tasks assessed (Difficult to assess due to trach, however likely [facility])                      Exercises      General Comments        Pertinent Vitals/Pain Pain Assessment: Faces Faces Pain Scale: Hurts a little bit Pain Location: Pt indicates mid sternal pain initially - does not report any pain after that Pain Intervention(s): Limited activity within patient's tolerance;Monitored during session;Repositioned    Home Living                      Prior Function            PT Goals (current goals can now be  found in the care plan section) Acute Rehab PT Goals Patient Stated Goal: Pt did not state goals at this time.  PT Goal Formulation: With patient Time For Goal Achievement: 02/25/15 Potential to Achieve Goals: Good Progress towards PT goals: Progressing toward goals    Frequency  Min 3X/week    PT Plan Discharge plan needs to be updated    Co-evaluation             End of Session Equipment Utilized During Treatment: Gait belt;Oxygen Activity Tolerance: Treatment limited secondary to medical complications (Comment) (Decrease O2 sats with  mobility) Patient left: in chair;with call bell/phone within reach     Time: 0908-0940 PT Time Calculation (min) (ACUTE ONLY): 32 min  Charges:  $Therapeutic Activity: 23-37 mins                    G Codes:      Adam Benson 2015-03-02, 10:57 AM   Adam Benson, PT, DPT Acute Rehabilitation Services Pager: [phone]

## 2015-02-23 NOTE — Progress Notes (Signed)
PULMONARY / CRITICAL CARE MEDICINE   Name: Adam Benson MRN: [id number] DOB: Jan 30, 1961    ADMISSION DATE:  02/01/2015 CONSULTATION DATE:  02/01/2015  REFERRING MD :  [facility]   CHIEF COMPLAINT:  Severe CAP  INITIAL PRESENTATION: 54 yo male from [place] with dyspnea, wt gain, edema.  Developed VDRF from PNA and ARDS, a fib with RVR.  STUDIES:  9/04 Echo > EF 50 to 55%  SIGNIFICANT EVENTS: 9/04 Intubated, transfer to [facility] 9/09 FOB, Fever 103 9/16 Repeat FOB 9/20 trach placed, fib rvr 9/21 leak, dislodged trach, changed to xtra long successful 9/24 moved to ICU and placed on PCV  SUBJECTIVE: No acute events overnight. Dyspnea improving per patient. Intermittent coughing persists. Denies any chest pain or pressure.  REVIEW OF SYSTEMS:  No nausea, emesis, or abdominal pain. No fever, chills, or sweats.  VITAL SIGNS: Temp:  [99.1 F (37.3 C)-100.7 F (38.2 C)] 99.7 F (37.6 C) (09/26 1130) Pulse Rate:  [32-122] 102 (09/26 0700) Resp:  [13-33] 26 (09/26 0700) BP: (103-136)/(47-79) 105/50 mmHg (09/26 0700) SpO2:  [87 %-97 %] 96 % (09/26 1400) FiO2 (%):  [50 %-60 %] 50 % (09/26 1400) Weight:  [288 lb 9.3 oz (130.9 kg)] 288 lb 9.3 oz (130.9 kg) (09/26 0500)   VENTILATOR SETTINGS: Vent Mode:  [-] PCV FiO2 (%):  [50 %-60 %] 50 % Set Rate:  [26 bmp] 26 bmp PEEP:  [10 cmH20-12 cmH20] 10 cmH20 Plateau Pressure:  [25 cmH20-35 cmH20] 35 cmH20   INTAKE / OUTPUT:  Intake/Output Summary (Last 24 hours) at 02/23/15 1526 Last data filed at 02/23/15 1400  Gross per 24 hour  Intake   3695 ml  Output   2380 ml  Net   1315 ml    PHYSICAL EXAMINATION: General:  Awake. Alert. Sitting in chair watching TV. Father at bedside.  Integument:  Warm & dry. No rash on exposed skin.  HEENT:  Trach in place. No icterus. PERRL. Cardiovascular:  Regular rate. No appreciable JVD sitting up.  Pulmonary:  Bilateral basilar crackles. Normal work of breathing on pressure control  ventilation. Abdomen: Soft. Normal bowel sounds. Nondistended.  Neurological:  Following commands. Grossly non-focal. CN in tact grossly.  CMP Latest Ref Rng 02/23/2015 02/22/2015 02/21/2015  Glucose 65 - 99 mg/dL 048(G) 891(Q) 945(W)  BUN 6 - 20 mg/dL 38(U) 82(C) 00(L)  Creatinine 0.61 - 1.24 mg/dL 4.91 7.91 5.05  Sodium 135 - 145 mmol/L 150(H) 151(H) 152(H)  Potassium 3.5 - 5.1 mmol/L 3.8 3.4(L) 4.3  Chloride 101 - 111 mmol/L 102 101 101  CO2 22 - 32 mmol/L 39(H) 40(H) 39(H)  Calcium 8.9 - 10.3 mg/dL 6.9(V) 9.4(I) 8.3(L)  Total Protein 6.5 - 8.1 g/dL - - -  Total Bilirubin 0.3 - 1.2 mg/dL - - -  Alkaline Phos 38 - 126 U/L - - -  AST 15 - 41 U/L - - -  ALT 17 - 63 U/L - - -    CBC Latest Ref Rng 02/23/2015 02/22/2015 02/21/2015  WBC 4.0 - 10.5 K/uL 16.3(H) 16.0(H) 19.3(H)  Hemoglobin 13.0 - 17.0 g/dL 0.1(K) 5.5(V) 10.2(L)  Hematocrit 39.0 - 52.0 % 33.9(L) 33.7(L) 37.2(L)  Platelets 150 - 400 K/uL 281 377 501(H)    CBG (last 3)   Recent Labs  02/21/15 0405 02/21/15 0803 02/21/15 1120  GLUCAP 115* 106* 125*   ABG    Component Value Date/Time   PHART 7.466* 02/23/2015 0442   PCO2ART 55.6* 02/23/2015 0442   PO2ART 67.6* 02/23/2015 7482  HCO3 39.5* 02/23/2015 0442   TCO2 41.2 02/23/2015 0442   O2SAT 92.5 02/23/2015 0442     Dg Chest Port 1 View  02/23/2015   CLINICAL DATA:  Acute on chronic failure, septic shock, ARDS, CHF  EXAM: PORTABLE CHEST 1 VIEW  COMPARISON:  Portable chest x-ray of February 22, 2015  FINDINGS: The lungs are reasonably well inflated. The tracheostomy appliance tip projects to the level of the inferior margin of the clavicular heads. The pulmonary interstitial markings remain increased bilaterally but are slightly less conspicuous today. The cardiac silhouette remains enlarged. The pulmonary vascularity is less engorged. The right-sided PICC line tip projects over the midportion of the SVC. The bony thorax exhibits no acute abnormality.  IMPRESSION:  Slight interval improvement in the appearance of the pulmonary interstitium which may reflect resolving interstitial edema or pneumonia. Stable cardiomegaly with decreased pulmonary vascular prominence.   Electronically Signed   By: David  [place] M.D.   On: 02/23/2015 07:44   Dg Chest Port 1 View  02/22/2015   CLINICAL DATA:  Shortness of breath, ARDS  EXAM: PORTABLE CHEST 1 VIEW  COMPARISON:  02/21/2015  FINDINGS: Tracheostomy in satisfactory position.  Diffuse bilateral airspace opacities with and mild perihilar predominance, unchanged. No pleural effusion or pneumothorax.  Cardiomegaly.  Right arm PICC terminates at the cavoatrial junction.  IMPRESSION: Tracheostomy in satisfactory position.  Stable diffuse bilateral airspace opacities.   Electronically Signed   By: Charline Bills M.D.   On: 02/22/2015 07:32    LINES/TUBES: ETT 9/4 >> 9/20 R IJ CVL 9/4 >> 9/9 L IJ CVL 9/9 >> 9/24 picc 9/24>> Trach 9/20 >>  CULTURES: C diff 9/13 >> Antigen positive, toxin negative >> colonization Blood 9/22 >>  Sputum 9/23 >> few C albicans Urine 9/23 >> multiple species  ASSESSMENT / PLAN:  PULMONARY Trach change to XLT 9/21>> A: Acute on chronic hypoxic respiratory failure - Pneumonia  Ventilator Dependent Respiratory Failure - s/p tracheostomy  H/O Asthma  P: Continue Vent Bundle Wean FiO2 on pressure control before PEEP Continue Solu-Medrol IV q6hr (48 hours) - Plan to start taper tomorrow. Continuing Xopenex nebs q6hr  CARDIOLOGY PICC 9/24>> A: A fib with RVR - CHA2DS2-VASc score 1. Acute Diastolic CHF - resolved  P: Diltiazem  via tube q6hr ASA  daily Monitor on tele  RENAL A: Acute on Chronic Renal Failure (CKD stage III) - Resolved Hypernatremia - Improving Hypokalemia - Resolved  P: Continue free water q6hr Monitoring UOP Trending renal function daily with BUN/Creatinine Daily electrolyte panel  GASTROENTEROLOGY A: No acute issues. S/P  PEG  P: Continuing Tube Feeds Pepcid via tube q12hr  HEMATOLOGY A: Anemia - Secondary to cricital illness. No signs of active bleeding.  P: Heparin [place] q8hr SCDs Monitor Hgb daily w/ CBC  INFECTION A: Severe CAP - No organism identified. Septic shock - resolved. C diff colonization  P: D/C Vancomycin Continue Merrem for now but likely D/C in 48 hours  ENDOCRINE A: Hyperglycemia - Likely steroid induced.  P: Restarting SSI Accuchecks q4hr  NEUROLOGY A: Delirium - resolved Chronic Pain - on Methadone  P: Continue Lyrica  po q8hr (home dose) Methadone  po q6hr  Fentanyl IV  FAMILY UPDATE:  Father at bedside and updated regarding plan of care.  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 Seems to be improving clinically. Mental status is improved. Continuing to wean FiO2. Plan to start tapering steroids tomorrow. I am de-escalating IV antibiotic therapy without  obvious source of infection from culture data.  I have spent a total of 32 minutes of critical care time today caring for the patient, updating the patient's father at bedside, & reviewing the patient's electronic medical record.  Donna Christen Jamison Neighbor, M.D. [facility] Pulmonary & Critical Care Pager:  [phone] After 3pm or if no response, call [phone]  02/23/2015, 3:26 PM

## 2015-02-23 NOTE — Progress Notes (Signed)
Rehab Admissions Coordinator Note:  Patient was screened by Clois Dupes for appropriateness for an Inpatient Acute Rehab Consult per PT recommendation.  At this time, we are recommending follow his porgress over next several days to assist in planning dispo needs. I iwll follow.Clois Dupes 02/23/2015, 11:37 AM  I can be reached at [phone].

## 2015-02-24 ENCOUNTER — Inpatient Hospital Stay (HOSPITAL_COMMUNITY): Payer: Medicaid Other

## 2015-02-24 LAB — GLUCOSE, CAPILLARY
GLUCOSE-CAPILLARY: 197 mg/dL — AB (ref 65–99)
Glucose-Capillary: 246 mg/dL — ABNORMAL HIGH (ref 65–99)
Glucose-Capillary: 205 mg/dL — ABNORMAL HIGH (ref 65–99)
Glucose-Capillary: 197 mg/dL — ABNORMAL HIGH (ref 65–99)
Glucose-Capillary: 217 mg/dL — ABNORMAL HIGH (ref 65–99)
Glucose-Capillary: 183 mg/dL — ABNORMAL HIGH (ref 65–99)

## 2015-02-24 LAB — CBC WITH DIFFERENTIAL/PLATELET
BASOS PCT: 0 %
Basophils Absolute: 0 10*3/uL (ref 0.0–0.1)
EOS ABS: 0 10*3/uL (ref 0.0–0.7)
Eosinophils Relative: 0 %
HCT: 33.8 % — ABNORMAL LOW (ref 39.0–52.0)
HEMOGLOBIN: 9.4 g/dL — AB (ref 13.0–17.0)
LYMPHS ABS: 0.5 10*3/uL — AB (ref 0.7–4.0)
LYMPHS PCT: 5 %
MCH: 21.7 pg — AB (ref 26.0–34.0)
MCHC: 27.8 g/dL — AB (ref 30.0–36.0)
MCV: 78.1 fL (ref 78.0–100.0)
MONO ABS: 0.3 10*3/uL (ref 0.1–1.0)
Monocytes Relative: 3 %
NEUTROS ABS: 8.7 10*3/uL — AB (ref 1.7–7.7)
Neutrophils Relative %: 92 %
Platelets: 310 10*3/uL (ref 150–400)
RBC: 4.33 MIL/uL (ref 4.22–5.81)
RDW: 24.4 % — AB (ref 11.5–15.5)
WBC: 9.5 10*3/uL (ref 4.0–10.5)

## 2015-02-24 LAB — RENAL FUNCTION PANEL
Albumin: 2.4 g/dL — ABNORMAL LOW (ref 3.5–5.0)
Anion gap: 10 (ref 5–15)
BUN: 45 mg/dL — ABNORMAL HIGH (ref 6–20)
CHLORIDE: 97 mmol/L — AB (ref 101–111)
CO2: 40 mmol/L — AB (ref 22–32)
CREATININE: 1.02 mg/dL (ref 0.61–1.24)
Calcium: 8.2 mg/dL — ABNORMAL LOW (ref 8.9–10.3)
GFR calc Af Amer: >60 mL/min (ref >60)
GFR calc non Af Amer: >60 mL/min (ref >60)
Glucose, Bld: 234 mg/dL — ABNORMAL HIGH (ref 65–99)
Phosphorus: 6 mg/dL — ABNORMAL HIGH (ref 2.5–4.6)
Potassium: 3.5 mmol/L (ref 3.5–5.1)
Sodium: 147 mmol/L — ABNORMAL HIGH (ref 135–145)

## 2015-02-24 LAB — MAGNESIUM: MAGNESIUM: 2.4 mg/dL (ref 1.7–2.4)

## 2015-02-24 MED ORDER — METHYLPREDNISOLONE SODIUM SUCC 40 MG IJ SOLR
40.0000 mg | Freq: Two times a day (BID) | INTRAMUSCULAR | Status: DC
  Administered 2015-02-24 – 2015-02-27 (×6): 40 mg via INTRAVENOUS
  Filled 2015-02-24 (×7): qty 1

## 2015-02-24 MED ORDER — FUROSEMIDE 10 MG/ML IJ SOLN
20.0000 mg | Freq: Once | INTRAMUSCULAR | Status: AC
  Administered 2015-02-24: 20 mg via INTRAVENOUS
  Filled 2015-02-24: qty 2

## 2015-02-24 MED ORDER — INFLUENZA VAC SPLIT QUAD 0.5 ML IM SUSY
0.5000 mL | PREFILLED_SYRINGE | INTRAMUSCULAR | Status: AC
  Administered 2015-02-26: 0.5 mL via INTRAMUSCULAR
  Filled 2015-02-24: qty 0.5

## 2015-02-24 NOTE — Progress Notes (Signed)
PULMONARY / CRITICAL CARE MEDICINE   Name: Adam Benson MRN: [id number] DOB: 1960/08/22    ADMISSION DATE:  02/01/2015 CONSULTATION DATE:  02/01/2015  REFERRING MD :  [facility]   CHIEF COMPLAINT:  Severe CAP  INITIAL PRESENTATION: 54 yo male from [place] with dyspnea, wt gain, edema.  Developed VDRF from PNA and ARDS, a fib with RVR.  STUDIES:  9/04 Echo > EF 50 to 55%  SIGNIFICANT EVENTS: 9/04 Intubated, transfer to [facility] 9/09 FOB, Fever 103 9/16 Repeat FOB 9/20 trach placed, fib rvr 9/21 leak, dislodged trach, changed to xtra long successful 9/24 moved to ICU and placed on PCV  SUBJECTIVE: Patient and nursing staff report no acute events overnight. Patient has been documented as being witnessed removing ice from bags to consume as well as drinking excess water from washcloths. He has been educated on the potential for aspiration with his tracheostomy. Patient denies any worsening in his dyspnea and reports baseline. Denies any significant cough.  REVIEW OF SYSTEMS:  No subjective fever, chills, or sweats. No abdominal pain or nausea. Nursing having to replace Panda for tube feedings. No chest pain or pressure.  VITAL SIGNS: Temp:  [98.9 F (37.2 C)-99.3 F (37.4 C)] 99.3 F (37.4 C) (09/27 1158) Pulse Rate:  [65-129] 93 (09/27 1211) Resp:  [13-30] 23 (09/27 1211) BP: (91-134)/(50-96) 134/79 mmHg (09/27 1200) SpO2:  [87 %-98 %] 96 % (09/27 1211) FiO2 (%):  [50 %] 50 % (09/27 1211) Weight:  [295 lb 6.7 oz (134 kg)] 295 lb 6.7 oz (134 kg) (09/27 0500)   VENTILATOR SETTINGS: Vent Mode:  [-] PCV FiO2 (%):  [50 %] 50 % Set Rate:  [26 bmp] 26 bmp Vt Set:  [570 mL] 570 mL PEEP:  [10 cmH20] 10 cmH20 Plateau Pressure:  [18 cmH20-24 cmH20] 21 cmH20   INTAKE / OUTPUT:  Intake/Output Summary (Last 24 hours) at 02/24/15 1235 Last data filed at 02/24/15 1200  Gross per 24 hour  Intake   2835 ml  Output   2800 ml  Net     35 ml    PHYSICAL EXAMINATION: General:   Awake. Alert. Sitting in bed watching TV.  Integument:  Warm & dry. No rash on exposed skin.  HEENT:  Trach in place. No icterus. PERRL. Cardiovascular:  Regular rate. No appreciable JVD.  Pulmonary:  Basilar crackles persist crackles. Normal work of breathing on pressure control ventilation. Abdomen: Soft. Normal bowel sounds. Nontender.  Neurological:  Following commands. Grossly non-focal. CN in tact grossly.  CMP Latest Ref Rng 02/24/2015 02/23/2015 02/22/2015  Glucose 65 - 99 mg/dL 409(W) 119(J) 478(G)  BUN 6 - 20 mg/dL 95(A) 21(H) 08(M)  Creatinine 0.61 - 1.24 mg/dL 5.78 4.69 6.29  Sodium 135 - 145 mmol/L 147(H) 150(H) 151(H)  Potassium 3.5 - 5.1 mmol/L 3.5 3.8 3.4(L)  Chloride 101 - 111 mmol/L 97(L) 102 101  CO2 22 - 32 mmol/L 40(H) 39(H) 40(H)  Calcium 8.9 - 10.3 mg/dL 8.2(L) 8.5(L) 8.2(L)  Total Protein 6.5 - 8.1 g/dL - - -  Total Bilirubin 0.3 - 1.2 mg/dL - - -  Alkaline Phos 38 - 126 U/L - - -  AST 15 - 41 U/L - - -  ALT 17 - 63 U/L - - -    CBC Latest Ref Rng 02/24/2015 02/23/2015 02/22/2015  WBC 4.0 - 10.5 K/uL 9.5 16.3(H) 16.0(H)  Hemoglobin 13.0 - 17.0 g/dL 5.2(W) 4.1(L) 2.4(M)  Hematocrit 39.0 - 52.0 % 33.8(L) 33.9(L) 33.7(L)  Platelets 150 -  400 K/uL 310 281 377    CBG (last 3)   Recent Labs  02/24/15 0346 02/24/15 0746 02/24/15 1118  GLUCAP 205* 197* 217*   ABG    Component Value Date/Time   PHART 7.466* 02/23/2015 0442   PCO2ART 55.6* 02/23/2015 0442   PO2ART 67.6* 02/23/2015 0442   HCO3 39.5* 02/23/2015 0442   TCO2 41.2 02/23/2015 0442   O2SAT 92.5 02/23/2015 0442     Dg Abd 1 View  02/24/2015   CLINICAL DATA:  Feeding tube placement  EXAM: ABDOMEN - 1 VIEW  COMPARISON:  02/21/2015  FINDINGS: No feeding tube is identified on this study. Images extend from the level of the carina through the middle pelvis. Nonobstructive gas pattern.  IMPRESSION: No feeding tube identified.   Electronically Signed   By: Esperanza Heir M.D.   On: 02/24/2015 11:03    Dg Chest Port 1 View  02/23/2015   CLINICAL DATA:  Acute on chronic failure, septic shock, ARDS, CHF  EXAM: PORTABLE CHEST 1 VIEW  COMPARISON:  Portable chest x-ray of February 22, 2015  FINDINGS: The lungs are reasonably well inflated. The tracheostomy appliance tip projects to the level of the inferior margin of the clavicular heads. The pulmonary interstitial markings remain increased bilaterally but are slightly less conspicuous today. The cardiac silhouette remains enlarged. The pulmonary vascularity is less engorged. The right-sided PICC line tip projects over the midportion of the SVC. The bony thorax exhibits no acute abnormality.  IMPRESSION: Slight interval improvement in the appearance of the pulmonary interstitium which may reflect resolving interstitial edema or pneumonia. Stable cardiomegaly with decreased pulmonary vascular prominence.   Electronically Signed   By: David  [place] M.D.   On: 02/23/2015 07:44    LINES/TUBES: ETT 9/4 >> 9/20 R IJ CVL 9/4 >> 9/9 L IJ CVL 9/9 >> 9/24 picc 9/24>> Trach 9/20 >>  CULTURES: C diff 9/13 >> Antigen positive, toxin negative >> colonization Blood 9/22 >>  Sputum 9/23 >> few C albicans Urine 9/23 >> multiple species  ASSESSMENT / PLAN:  PULMONARY Trach change to XLT 9/21>> A: Acute on chronic hypoxic respiratory failure - Pneumonia  Ventilator Dependent Respiratory Failure - s/p tracheostomy  H/O Asthma  P: Continue Vent Bundle Wean FiO2 on pressure control before PEEP Switch Solu-medrol to q12hr Continuing Xopenex nebs q6hr Gentle diuresis with Lasix IV  CARDIOLOGY PICC 9/24>> A: A fib with RVR - CHA2DS2-VASc score 1. Acute Diastolic CHF - resolved  P: Diltiazem  via tube q6hr ASA  daily Monitor on tele Gentle Diuresis with Lasix IV  RENAL A: Acute on Chronic Renal Failure (CKD stage III) - Resolved Hypernatremia - Improving Hypokalemia - Resolved  P: Continue free water q6hr Monitoring  UOP Trending renal function daily with BUN/Creatinine Daily electrolyte panel  GASTROENTEROLOGY A: No acute issues. Replacing Panda today  P: Continuing Tube Feeds Pepcid via tube q12hr  HEMATOLOGY A: Anemia - Secondary to cricital illness. No signs of active bleeding.  P: Heparin [place] q8hr SCDs Monitor Hgb daily w/ CBC  INFECTION A: Severe CAP - No organism identified. Septic shock - resolved. C diff colonization  P: D/C Vancomycin Continue Merrem for now but likely D/C in 24 hours  ENDOCRINE A: Hyperglycemia - Likely steroid induced.  P: Continuing SSI Accuchecks q4hr  NEUROLOGY A: Delirium - resolved Chronic Pain - on Methadone  P: Continue Lyrica  po q8hr (home dose) Methadone  po q6hr  Fentanyl IV  FAMILY UPDATE:  Father at bedside and updated  regarding plan of care.  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 Seems to be improving clinically. Mental status is improved. Continuing to wean FiO2. Initiating steroid taper today. Gentle diuresis with Lasix.  I have spent a total of 34 minutes of critical care time today caring for the patient & reviewing the patient's electronic medical record.  Donna Christen Jamison Neighbor, M.D. [facility] Pulmonary & Critical Care Pager:  [phone] After 3pm or if no response, call [phone]  02/24/2015, 12:35 PM

## 2015-02-24 NOTE — Progress Notes (Signed)
Patient has been witnessed removing ice from ice bags and consuming ice.  As well as drinking excess water off wash cloths which have been given to patient for patient comfort measures.  Patient was educated that as a new trach patient, he should not be consuming liquids by mouth, as this could further complicate his pneumonia and respiratory status.

## 2015-02-24 NOTE — Progress Notes (Signed)
Inpatient Diabetes Program Recommendations  AACE/ADA: New Consensus Statement on Inpatient Glycemic Control (2015)  Target Ranges:  Prepandial:   less than 140 mg/dL      Peak postprandial:   less than 180 mg/dL (1-2 hours)      Critically ill patients:  140 - 180 mg/dL   Review of Glycemic Control:  Results for DAKARAI, JEPPSEN (MRN [id number]) as of 02/24/2015 14:05  Ref. Range 02/23/2015 19:21 02/24/2015 00:17 02/24/2015 03:46 02/24/2015 07:46 02/24/2015 11:18  Glucose-Capillary Latest Ref Range: 65-99 mg/dL 621 (H) 308 (H) 657 (H) 197 (H) 217 (H)    Inpatient Diabetes Program Recommendations:   Consider increasing Novolog correction to moderate q 4 hours.  Thanks, Beryl Meager, RN, BC-ADM Inpatient Diabetes Coordinator Pager [phone] (8a-5p)

## 2015-02-25 DIAGNOSIS — E870 Hyperosmolality and hypernatremia: Secondary | ICD-10-CM

## 2015-02-25 LAB — CULTURE, BLOOD (ROUTINE X 2)
CULTURE: NO GROWTH
Culture: NO GROWTH

## 2015-02-25 LAB — RENAL FUNCTION PANEL
ALBUMIN: 2.2 g/dL — AB (ref 3.5–5.0)
ANION GAP: 9 (ref 5–15)
BUN: 45 mg/dL — AB (ref 6–20)
CALCIUM: 8.5 mg/dL — AB (ref 8.9–10.3)
CO2: 39 mmol/L — AB (ref 22–32)
CREATININE: 0.78 mg/dL (ref 0.61–1.24)
Chloride: 101 mmol/L (ref 101–111)
GLUCOSE: 257 mg/dL — AB (ref 65–99)
POTASSIUM: 3.7 mmol/L (ref 3.5–5.1)
Phosphorus: 3.7 mg/dL (ref 2.5–4.6)
Sodium: 149 mmol/L — ABNORMAL HIGH (ref 135–145)

## 2015-02-25 LAB — GLUCOSE, CAPILLARY
GLUCOSE-CAPILLARY: 207 mg/dL — AB (ref 65–99)
GLUCOSE-CAPILLARY: 221 mg/dL — AB (ref 65–99)
GLUCOSE-CAPILLARY: 220 mg/dL — AB (ref 65–99)
GLUCOSE-CAPILLARY: 224 mg/dL — AB (ref 65–99)
Glucose-Capillary: 228 mg/dL — ABNORMAL HIGH (ref 65–99)
Glucose-Capillary: 239 mg/dL — ABNORMAL HIGH (ref 65–99)
Glucose-Capillary: 242 mg/dL — ABNORMAL HIGH (ref 65–99)

## 2015-02-25 LAB — CBC WITH DIFFERENTIAL/PLATELET
Basophils Absolute: 0 10*3/uL (ref 0.0–0.1)
Basophils Relative: 0 %
EOS PCT: 0 %
Eosinophils Absolute: 0 10*3/uL (ref 0.0–0.7)
HEMATOCRIT: 38.1 % — AB (ref 39.0–52.0)
HEMOGLOBIN: 10.7 g/dL — AB (ref 13.0–17.0)
LYMPHS ABS: 0.5 10*3/uL — AB (ref 0.7–4.0)
Lymphocytes Relative: 5 %
MCH: 21.6 pg — ABNORMAL LOW (ref 26.0–34.0)
MCHC: 28.1 g/dL — ABNORMAL LOW (ref 30.0–36.0)
MCV: 77 fL — AB (ref 78.0–100.0)
MONOS PCT: 9 %
Monocytes Absolute: 1 10*3/uL (ref 0.1–1.0)
Neutro Abs: 9.1 10*3/uL — ABNORMAL HIGH (ref 1.7–7.7)
Neutrophils Relative %: 86 %
Platelets: 306 10*3/uL (ref 150–400)
RBC: 4.95 MIL/uL (ref 4.22–5.81)
RDW: 24.5 % — AB (ref 11.5–15.5)
WBC: 10.6 10*3/uL — AB (ref 4.0–10.5)

## 2015-02-25 LAB — MAGNESIUM: Magnesium: 2.6 mg/dL — ABNORMAL HIGH (ref 1.7–2.4)

## 2015-02-25 MED ORDER — INSULIN GLARGINE 100 UNIT/ML ~~LOC~~ SOLN
10.0000 [IU] | Freq: Every day | SUBCUTANEOUS | Status: DC
  Administered 2015-02-25: 10 [IU] via SUBCUTANEOUS
  Filled 2015-02-25 (×2): qty 0.1

## 2015-02-25 NOTE — Progress Notes (Signed)
Physical Therapy Treatment Patient Details Name: Adam Benson MRN: [id number] DOB: 25-May-1961 Today's Date: 02/25/2015    History of Present Illness Pt is a 54 y/o male who was transferred from [facility] with dyspnea, weight gain and LE edema. He failed BiPap, was intubated 9/4 and transferred to [facility]. He was found to have CAP with ARDS. ICU couse has been complicated by a-fib with RVR and bradycardia.     PT Comments    Pt progressing towards physical therapy goals. Pt appears frustrated multiple times throughout session and is somewhat impulsive at times to initiate transfers. At start of session, pt declines OOB but later agrees to transfer to chair. Rehab effort is fair and pt requires motivation to participate. Will continue to follow.   Follow Up Recommendations  CIR;Supervision/Assistance - 24 hour     Equipment Recommendations  Rolling walker with 5" wheels    Recommendations for Other Services Rehab consult     Precautions / Restrictions Precautions Precautions: Fall Precaution Comments: Trach, Vent, NG tube, Foley, Rectal tube, Multiple IV lines Restrictions Weight Bearing Restrictions: No    Mobility  Bed Mobility Overal bed mobility: Needs Assistance;+ 2 for safety/equipment Bed Mobility: Supine to Sit     Supine to sit: Supervision     General bed mobility comments: HOB elevated. +2 utilized for management of lines. Pt initiating transition to EOB before equipment was ready. Pt appeared frustrated after therapist stopped him from sitting up, even after explaination.   Transfers Overall transfer level: Needs assistance Equipment used: 2 person hand held assist Transfers: Sit to/from [employer] Sit to Stand: Min assist;+2 physical assistance;+2 safety/equipment Stand pivot transfers: Min assist;+2 physical assistance;+2 safety/equipment       General transfer comment: Pt was able to power-up to full standing position and take a few  pivotal steps around to the recliner chair. RN was present for management of vent lines. Pt again impulsive with initiating transfers, and appeared frustrated when asked not to pull on chair, lines, tubes, etc.   Ambulation/Gait             General Gait Details: Unable at this time due to low O2 sats - to 74%. Also limited due to vent.    Stairs            Wheelchair Mobility    Modified Rankin (Stroke Patients Only)       Balance Overall balance assessment: Needs assistance Sitting-balance support: Feet supported;No upper extremity supported Sitting balance-Leahy Scale: Fair     Standing balance support: Bilateral upper extremity supported;During functional activity Standing balance-Leahy Scale: Poor                      Cognition Arousal/Alertness: Awake/alert Behavior During Therapy: Flat affect;Impulsive (frustrated at times, impulsive with initiating transfers) Overall Cognitive Status: Within Functional Limits for tasks assessed                      Exercises      General Comments        Pertinent Vitals/Pain Pain Assessment: No/denies pain    Home Living                      Prior Function            PT Goals (current goals can now be found in the care plan section) Acute Rehab PT Goals Patient Stated Goal: Pt did not state goals at this  time.  PT Goal Formulation: With patient Time For Goal Achievement: 03/20/15 Potential to Achieve Goals: Good Progress towards PT goals: Progressing toward goals    Frequency  Min 3X/week    PT Plan Current plan remains appropriate    Co-evaluation             End of Session Equipment Utilized During Treatment: Oxygen Activity Tolerance: Treatment limited secondary to medical complications (Comment) (Decreased O2 sats with mobility) Patient left: in chair;with call bell/phone within reach     Time: 1015-1032 PT Time Calculation (min) (ACUTE ONLY): 17 min  Charges:   $Therapeutic Activity: 8-22 mins                    G Codes:      Conni Slipper 03/20/15, 1:34 PM   Conni Slipper, PT, DPT Acute Rehabilitation Services Pager: [phone]

## 2015-02-25 NOTE — Progress Notes (Signed)
Nutrition Follow-up  DOCUMENTATION CODES:   Morbid obesity  INTERVENTION:    Continue TF via small NGT (panda) with Vital High Protein at goal rate of 75 ml/h (1800 ml per day) with Prostat 30 ml TID to provide 2100 kcals, 203 gm protein, 1505 ml free water daily.  NUTRITION DIAGNOSIS:   Inadequate oral intake related to inability to eat as evidenced by NPO status.  Ongoing   GOAL:   Provide needs based on ASPEN/SCCM guidelines  Met   MONITOR:   TF tolerance, Vent status, Labs, Weight trends  REASON FOR ASSESSMENT:   Consult Enteral/tube feeding initiation and management  ASSESSMENT:   Presented to [facility] after a few days of increasing dyspnea, weight gain, & lower extremity edema. Patient initially tried on BiPAP. Required intubation and transferred to [facility] on 9/4.  Discussed patient with RN today. Patient is tolerating TF well via panda with minimal residuals. Required replacement of panda yesterday. Receiving 400 ml free water flushes every 4 hours. Sodium remains elevated. Patient is thirsty. Patient caught eating ice from ice bags in room yesterday. Sitting up in a chair today. Acute renal failure resolved. Continues to receive steroids.   Diet Order:  Diet NPO time specified  Skin:  Reviewed, no issues  Last BM:  9/27 (loose, C diff +, rectal tube)  Height:   Ht Readings from Last 1 Encounters:  11/18/14 6' (1.829 m)    Weight:   Wt Readings from Last 1 Encounters:  02/25/15 287 lb 0.6 oz (130.2 kg)    Ideal Body Weight:  80.9 kg  BMI:  Body mass index is 38.92 kg/(m^2).  Estimated Nutritional Needs:   Kcal:  9672-8979  Protein:  202 gm  Fluid:  2.5 L  EDUCATION NEEDS:   No education needs identified at this time  Molli Barrows, [place], [place], [place] Pager [phone] After Hours Pager [phone]

## 2015-02-25 NOTE — Progress Notes (Signed)
Patient sat up for most part , transfer to the chair this morning , sat on the chair for most of the day. Transfer back to bed at 1800. Patient tolerated movements well.

## 2015-02-25 NOTE — Progress Notes (Signed)
PULMONARY / CRITICAL CARE MEDICINE   Name: Adam Benson MRN: [id number] DOB: 02/18/1961    ADMISSION DATE:  02/01/2015 CONSULTATION DATE:  02/01/2015  REFERRING MD :  [facility]   CHIEF COMPLAINT:  Severe CAP  INITIAL PRESENTATION: 54 yo male from [place] with dyspnea, wt gain, edema.  Developed VDRF from PNA and ARDS, a fib with RVR.  STUDIES:  9/04 Echo > EF 50 to 55%  SIGNIFICANT EVENTS: 9/04 Intubated, transfer to [facility] 9/09 FOB, Fever 103 9/16 Repeat FOB 9/20 trach placed, fib rvr 9/21 leak, dislodged trach, changed to xtra long successful 9/24 moved to ICU and placed on PCV  SUBJECTIVE: No acute events overnight. Continuing to wean pressure control ventilation. Denies any chest pain or pressure. Dyspnea unchanged. Minimal coughing.  REVIEW OF SYSTEMS:  No fever, chills, or sweats overnight. No nausea or vomiting. No abdominal pain.  VITAL SIGNS: Temp:  [98.5 F (36.9 C)-99.4 F (37.4 C)] 99 F (37.2 C) (09/28 1147) Pulse Rate:  [49-126] 76 (09/28 1222) Resp:  [7-32] 32 (09/28 1222) BP: (100-160)/(61-100) 111/64 mmHg (09/28 0900) SpO2:  [89 %-100 %] 92 % (09/28 1222) FiO2 (%):  [40 %-50 %] 40 % (09/28 1222) Weight:  [287 lb 0.6 oz (130.2 kg)] 287 lb 0.6 oz (130.2 kg) (09/28 0500)   VENTILATOR SETTINGS: Vent Mode:  [-] PCV FiO2 (%):  [40 %-50 %] 40 % Set Rate:  [26 bmp] 26 bmp PEEP:  [10 cmH20] 10 cmH20 Plateau Pressure:  [15 cmH20-24 cmH20] 15 cmH20   INTAKE / OUTPUT:  Intake/Output Summary (Last 24 hours) at 02/25/15 1417 Last data filed at 02/25/15 1000  Gross per 24 hour  Intake   1745 ml  Output   2270 ml  Net   -525 ml    PHYSICAL EXAMINATION: General:  Sitting in chair watching TV. No distress. Alert. Integument:  Warm & dry. No rash on exposed skin.  HEENT:  Trach in place. No scleral injection. PERRL. Cardiovascular:  Regular rate. No appreciated murmurs, rubs, or gallops. Pulmonary:  Mild improvement of bilateral basilar crackles. Normal  work of breathing on pressure control ventilation. Abdomen: Soft. Normal bowel sounds. Nontender. Protuberant. Neurological:  Following commands. Grossly non-focal. CN in tact grossly.  CMP Latest Ref Rng 02/25/2015 02/24/2015 02/23/2015  Glucose 65 - 99 mg/dL 712(R) 975(O) 832(P)  BUN 6 - 20 mg/dL 49(I) 26(E) 15(A)  Creatinine 0.61 - 1.24 mg/dL 3.09 4.07 6.80  Sodium 135 - 145 mmol/L 149(H) 147(H) 150(H)  Potassium 3.5 - 5.1 mmol/L 3.7 3.5 3.8  Chloride 101 - 111 mmol/L 101 97(L) 102  CO2 22 - 32 mmol/L 39(H) 40(H) 39(H)  Calcium 8.9 - 10.3 mg/dL 8.8(P) 1.0(R) 1.5(X)  Total Protein 6.5 - 8.1 g/dL - - -  Total Bilirubin 0.3 - 1.2 mg/dL - - -  Alkaline Phos 38 - 126 U/L - - -  AST 15 - 41 U/L - - -  ALT 17 - 63 U/L - - -    CBC Latest Ref Rng 02/25/2015 02/24/2015 02/23/2015  WBC 4.0 - 10.5 K/uL 10.6(H) 9.5 16.3(H)  Hemoglobin 13.0 - 17.0 g/dL 10.7(L) 9.4(L) 9.2(L)  Hematocrit 39.0 - 52.0 % 38.1(L) 33.8(L) 33.9(L)  Platelets 150 - 400 K/uL 306 310 281    CBG (last 3)   Recent Labs  02/25/15 0343 02/25/15 0743 02/25/15 1144  GLUCAP 228* 207* 221*   ABG    Component Value Date/Time   PHART 7.466* 02/23/2015 0442   PCO2ART 55.6* 02/23/2015 0442  PO2ART 67.6* 02/23/2015 0442   HCO3 39.5* 02/23/2015 0442   TCO2 41.2 02/23/2015 0442   O2SAT 92.5 02/23/2015 0442     Dg Abd 1 View  02/24/2015   CLINICAL DATA:  Feeding tube placement  EXAM: ABDOMEN - 1 VIEW  COMPARISON:  02/24/2015  FINDINGS: Dobbhoff feeding tube crosses the gastroesophageal junction with tip over the body of the stomach.  IMPRESSION: Dobbhoff feeding tube as described   Electronically Signed   By: Esperanza Heir M.D.   On: 02/24/2015 13:29   Dg Abd 1 View  02/24/2015   CLINICAL DATA:  Feeding tube placement  EXAM: ABDOMEN - 1 VIEW  COMPARISON:  02/21/2015  FINDINGS: No feeding tube is identified on this study. Images extend from the level of the carina through the middle pelvis. Nonobstructive gas pattern.   IMPRESSION: No feeding tube identified.   Electronically Signed   By: Esperanza Heir M.D.   On: 02/24/2015 11:03    LINES/TUBES: ETT 9/4 >> 9/20 R IJ CVL 9/4 >> 9/9 L IJ CVL 9/9 >> 9/24 picc 9/24>> Trach 9/20 >>  CULTURES: C diff 9/13 >> Antigen positive, toxin negative >> colonization Blood 9/22 >> negative Sputum 9/23 >> few C albicans Urine 9/23 >> multiple species  ASSESSMENT / PLAN:  PULMONARY Trach change to XLT 9/21>> A: Acute on chronic hypoxic respiratory failure - Pneumonia  Ventilator Dependent Respiratory Failure - s/p tracheostomy  H/O Asthma  P: Continue Vent Bundle Wean FiO2 on pressure control before PEEP Continue Solu-medrol to q12hr Continuing Xopenex nebs q6hr Holding on further diuresis  CARDIOLOGY PICC 9/24>> A: A fib with RVR - CHA2DS2-VASc score 1. Acute Diastolic CHF - resolved  P: Diltiazem  via tube q6hr ASA  daily Monitor on tele Holding on further diuresis  RENAL A: Acute on Chronic Renal Failure (CKD stage III) - Resolved Hypernatremia - stable Hypokalemia - Resolved  P: Continue free water q6hr Monitoring UOP Trending renal function daily with BUN/Creatinine Daily electrolyte panel  GASTROENTEROLOGY A: No acute issues.  P: Continuing Tube Feeds Pepcid via tube q12hr  HEMATOLOGY A: Anemia - Secondary to cricital illness. No signs of active bleeding.  P: Heparin [place] q8hr SCDs Monitor Hgb daily w/ CBC  INFECTION A: Severe CAP - No organism identified. Septic shock - resolved. C diff colonization  P: DC meropenem Plan to reculture for any fever  ENDOCRINE A: Hyperglycemia - Likely steroid induced. Unontrolled.  P: Continuing SSI Accuchecks q4hr Starting Lantus 10 units subcutaneous daily at bedtime   NEUROLOGY A: Delirium - resolved Chronic Pain - on Methadone  P: Continue Lyrica  po q8hr (home dose) Methadone  po q6hr  Fentanyl IV  FAMILY UPDATE:  Father at bedside and  updated regarding plan of care.  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 Seems to be improving clinically. Mental status is improved. Continuing to wean FiO2. Mild improvement and ventilator requirements today. Holding on further diuresis given persistent hypernatremia.  I have spent a total of 31 minutes of critical care time today caring for the patient & reviewing the patient's electronic medical record.  Donna Christen Jamison Neighbor, M.D. [facility] Pulmonary & Critical Care Pager:  [phone] After 3pm or if no response, call [phone]  02/25/2015, 2:17 PM

## 2015-02-26 DIAGNOSIS — I4891 Unspecified atrial fibrillation: Secondary | ICD-10-CM

## 2015-02-26 LAB — CBC WITH DIFFERENTIAL/PLATELET
BASOS PCT: 0 %
Basophils Absolute: 0 10*3/uL (ref 0.0–0.1)
EOS PCT: 0 %
Eosinophils Absolute: 0 10*3/uL (ref 0.0–0.7)
HEMATOCRIT: 38.2 % — AB (ref 39.0–52.0)
Hemoglobin: 11 g/dL — ABNORMAL LOW (ref 13.0–17.0)
LYMPHS ABS: 0.4 10*3/uL — AB (ref 0.7–4.0)
Lymphocytes Relative: 4 %
MCH: 21.8 pg — AB (ref 26.0–34.0)
MCHC: 28.8 g/dL — ABNORMAL LOW (ref 30.0–36.0)
MCV: 75.8 fL — AB (ref 78.0–100.0)
MONO ABS: 0.4 10*3/uL (ref 0.1–1.0)
MONOS PCT: 4 %
Neutro Abs: 8.7 10*3/uL — ABNORMAL HIGH (ref 1.7–7.7)
Neutrophils Relative %: 92 %
PLATELETS: 292 10*3/uL (ref 150–400)
RBC: 5.04 MIL/uL (ref 4.22–5.81)
RDW: 24.3 % — AB (ref 11.5–15.5)
WBC: 9.5 10*3/uL (ref 4.0–10.5)

## 2015-02-26 LAB — GLUCOSE, CAPILLARY
GLUCOSE-CAPILLARY: 228 mg/dL — AB (ref 65–99)
GLUCOSE-CAPILLARY: 235 mg/dL — AB (ref 65–99)
GLUCOSE-CAPILLARY: 238 mg/dL — AB (ref 65–99)
Glucose-Capillary: 234 mg/dL — ABNORMAL HIGH (ref 65–99)
Glucose-Capillary: 233 mg/dL — ABNORMAL HIGH (ref 65–99)

## 2015-02-26 LAB — RENAL FUNCTION PANEL
ALBUMIN: 2.2 g/dL — AB (ref 3.5–5.0)
ANION GAP: 8 (ref 5–15)
BUN: 41 mg/dL — ABNORMAL HIGH (ref 6–20)
CHLORIDE: 97 mmol/L — AB (ref 101–111)
CO2: 37 mmol/L — ABNORMAL HIGH (ref 22–32)
Calcium: 8.4 mg/dL — ABNORMAL LOW (ref 8.9–10.3)
Creatinine, Ser: 0.78 mg/dL (ref 0.61–1.24)
GFR calc Af Amer: >60 mL/min (ref >60)
Glucose, Bld: 261 mg/dL — ABNORMAL HIGH (ref 65–99)
PHOSPHORUS: 4.3 mg/dL (ref 2.5–4.6)
POTASSIUM: 4.3 mmol/L (ref 3.5–5.1)
Sodium: 142 mmol/L (ref 135–145)

## 2015-02-26 LAB — MAGNESIUM: Magnesium: 2.5 mg/dL — ABNORMAL HIGH (ref 1.7–2.4)

## 2015-02-26 MED ORDER — INSULIN NPH (HUMAN) (ISOPHANE) 100 UNIT/ML ~~LOC~~ SUSP
10.0000 [IU] | Freq: Once | SUBCUTANEOUS | Status: AC
  Administered 2015-02-26: 10 [IU] via SUBCUTANEOUS
  Filled 2015-02-26: qty 10

## 2015-02-26 MED ORDER — FUROSEMIDE 10 MG/ML IJ SOLN
20.0000 mg | Freq: Once | INTRAMUSCULAR | Status: AC
  Administered 2015-02-26: 20 mg via INTRAVENOUS
  Filled 2015-02-26: qty 2

## 2015-02-26 MED ORDER — INSULIN GLARGINE 100 UNIT/ML ~~LOC~~ SOLN
30.0000 [IU] | Freq: Every day | SUBCUTANEOUS | Status: DC
  Administered 2015-02-26 – 2015-03-03 (×6): 30 [IU] via SUBCUTANEOUS
  Filled 2015-02-26 (×7): qty 0.3

## 2015-02-26 NOTE — Evaluation (Signed)
Clinical/Bedside Swallow Evaluation Patient Details  Name: Adam Benson MRN: [id number] Date of Birth: 12/07/1960  Today's Date: 02/26/2015 Time: SLP Start Time (ACUTE ONLY): 1410 SLP Stop Time (ACUTE ONLY): 1425 SLP Time Calculation (min) (ACUTE ONLY): 15 min  Past Medical History:  Past Medical History  Diagnosis Date  . Gout   . HTN (hypertension), benign   . Chronic atrial fibrillation   . Hyperlipemia   . Nerve damage     right arm  . Obesity   . Insomnia   . GERD (gastroesophageal reflux disease)   . History of pneumonia 05/2014     two month hosp stay, vent, trach   . 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 Peptic ulcer 04/2014   Past Surgical History:  Past Surgical History  Procedure Laterality Date  . Arm surgery      right  . Appendectomy    . Knee surgery      right   HPI:  54 yo male from [place] with dyspnea, wt gain, edema. Developed VDRF from PNA and ARDS, a fib with RVR. Trach placed on 9/20, pt has remained on ventilator.    Assessment / Plan / Recommendation Clinical Impression  Pt seen for swallow eval while on ventilator with in line PMSV in place (see prior note). Given excellent tolerance and breath support, provided trials of ice chips and sips of water with finding of normal swallow function, clear vocal quality and no signs of aspiration over 6 oz intake. Explained rationale for use of PMSV for improved airway protection and risk of aspiration with PO intake otherwise. Recommend pt continue trials of thin and solid PO while utilizing in line PMSV.     Aspiration Risk  Moderate    Diet Recommendation NPO        Other  Recommendations Oral Care Recommendations: Oral care BID   Follow Up Recommendations  Inpatient Rehab    Frequency and Duration min 3x week      Pertinent Vitals/Pain NA    SLP Swallow Goals     Swallow Study Prior Functional Status       General Other Pertinent Information:  54 yo male from [place] with dyspnea, wt gain, edema. Developed VDRF from PNA and ARDS, a fib with RVR. Trach placed on 9/20, pt has remained on ventilator.  Type of Study: Bedside swallow evaluation Previous Swallow Assessment: none Diet Prior to this Study: NPO Temperature Spikes Noted: No Respiratory Status: Ventilator Trach Size and Type: Cuff;Deflated;With PMSV in place;#6 History of Recent Intubation: Yes Length of Intubations (days): 16 days Date extubated: 02/17/15 Behavior/Cognition: Alert;Cooperative;Pleasant mood Oral Cavity - Dentition: Adequate natural dentition/normal for age Self-Feeding Abilities: Able to feed self Patient Positioning: Upright in bed Baseline Vocal Quality: Normal Volitional Cough: Strong Volitional Swallow: Able to elicit    Oral/Motor/Sensory Function Overall Oral Motor/Sensory Function: Appears within functional limits for tasks assessed   Ice Chips Ice chips: Within functional limits   Thin Liquid Thin Liquid: Within functional limits Presentation: Cup;Straw;Self Fed    Nectar Thick Nectar Thick Liquid: Not tested   Honey Thick Honey Thick Liquid: Not tested   Puree Puree: Not tested   Solid   GO    Solid: Not tested      Harlon Ditty, MA CCC-SLP [phone]  [place], Riley Nearing 02/26/2015,2:51 PM

## 2015-02-26 NOTE — Clinical Social Work Placement (Signed)
   CLINICAL SOCIAL WORK PLACEMENT  NOTE  Date:  02/26/2015  Patient Details  Name: Adam Benson MRN: [id number] Date of Birth: [birth year]  Clinical Social Work is seeking post-discharge placement for this patient at the Skilled  Nursing Facility level of care (*CSW will initial, date and re-position this form in  chart as items are completed):  Yes   Patient/family provided with [facility] Clinical Social Work Department's list of facilities offering this level of care within the geographic area requested by the patient (or if unable, by the patient's family).  Yes   Patient/family informed of their freedom to choose among providers that offer the needed level of care, that participate in Medicare, Medicaid or managed care program needed by the patient, have an available bed and are willing to accept the patient.  Yes   Patient/family informed of [facility]'s ownership interest in [facility] and [facility], as well as of the fact that they are under no obligation to receive care at these facilities.  PASRR submitted to EDS on 02/26/15     PASRR number received on 02/26/15     Existing PASRR number confirmed on       FL2 transmitted to all facilities in geographic area requested by pt/family on 02/26/15     FL2 transmitted to all facilities within larger geographic area on       Patient informed that his/her managed care company has contracts with or will negotiate with certain facilities, including the following:            Patient/family informed of bed offers received.  Patient chooses bed at       Physician recommends and patient chooses bed at      Patient to be transferred to   on  .  Patient to be transferred to facility by       Patient family notified on   of transfer.  Name of family member notified:        PHYSICIAN       Additional Comment:    _______________________________________________ Derenda Fennel, MSW, LCSWA [phone] 02/26/2015  3:50 PM

## 2015-02-26 NOTE — Clinical Social Work Note (Signed)
Clinical Social Work Assessment  Patient Details  Name: Adam Benson MRN: [id number] Date of Birth: 04-10-1961  Date of referral:  02/26/15               Reason for consult:  Discharge Planning, Facility Placement                Permission sought to share information with:  Case Manager, Customer service manager, Family Supports Permission granted to share information::  Yes, Verbal Permission Granted  Name::      Adam Benson )  Agency::   (Vent SNF )  Relationship::   (Father )  Contact Information:   [phone])  Housing/Transportation Living arrangements for the past 2 months:  [place] of Information:  Patient, Parent Patient Interpreter Needed:  None Criminal Activity/Legal Involvement Pertinent to Current Situation/Hospitalization:  No - Comment as needed Significant Relationships:  Parents Lives with:  Self Do you feel safe going back to the place where you live?  Yes Need for family participation in patient care:  Yes (Comment)  Care giving concerns:  Patient likely requiring vent SNF at discharge.    Social Worker assessment / plan:  Holiday representative met with patient in reference to post-acute placement for SNF. CSW introduced CSW role and SNF process. Patient on ventilator at 40% however able to participate during assessment. Patient stated that she is agreeable to vent SNF placement ONLY in [place] ([facility]). Patient stated he will NOT consider going further outside of [facility] and [facility] and will plan to return home if he is unable to transition to [facility]. Patient confirmed that he lives alone and does not have any children. Pt stated he has friends and family that will assist him if he decides to return home. Patient stated he is not familiar with [facility] but would prefer placement closer to home. CSW review SNF process again pertaining to ventilator facilities and patient agreeable to [facility] SNF only at this  time. No further concerns reported at this time. BSW Intern has completed FL-2 and uploaded clinicals into Care Finder Pro. CSW to review and fax information to [facility] SNF for review. CSW will continue to follow pt and pt's family for continued support and to facilitate pt's discharge needs once medically stable.  Employment status:  Disabled (Comment on whether or not currently receiving Disability) Insurance information:  Medicaid [place] PT Recommendations:  Inpatient Rehab Consult Information / Referral to community resources:  [place]  Patient/Family's Response to care:  Pt sitting up in bed alert and oriented x4. Patient withdrawn during assessment watching TV. Pt's father supportive/involved in pt's care. Pt appreciated social work intervention.   Patient/Family's Understanding of and Emotional Response to Diagnosis, Current Treatment, and Prognosis:  Patient understanding of medical intervention and on-going treatment. Patient also understands that he will likely discharge to vent SNF. CSW remains available as needed.   Emotional Assessment Appearance:  Appears stated age Attitude/Demeanor/Rapport:  Apprehensive Affect (typically observed):  Accepting, Calm, Withdrawn Orientation:  Oriented to Self, Oriented to Place, Oriented to  Time, Oriented to Situation Alcohol / Substance use:  Not Applicable Psych involvement (Current and /or in the community):  No (Comment)  Discharge Needs  Concerns to be addressed:  Care Coordination, Basic Needs Readmission within the last 30 days:  No Current discharge risk:  Lives alone, Dependent with Mobility Barriers to Discharge:  Continued Medical Work up   [employer], MSW, [place] [phone] 02/26/2015 3:39  PM  

## 2015-02-26 NOTE — Progress Notes (Signed)
PULMONARY / CRITICAL CARE MEDICINE   Name: Adam Benson MRN: [id number] DOB: [birth year]    ADMISSION DATE:  02/01/2015 CONSULTATION DATE:  02/01/2015  REFERRING MD :  [facility]   CHIEF COMPLAINT:  Severe CAP  INITIAL PRESENTATION: 54 yo male from [place] with dyspnea, wt gain, edema.  Developed VDRF from PNA and ARDS, a fib with RVR.  STUDIES:  9/04 Echo > EF 50 to 55%  SIGNIFICANT EVENTS: 9/04 Intubated, transfer to [facility] 9/09 FOB, Fever 103 9/16 Repeat FOB 9/20 trach placed, fib rvr 9/21 leak, dislodged trach, changed to xtra long successful 9/24 moved to ICU and placed on PCV  SUBJECTIVE: Dyspnea unchanged. Minimal coughing. No chest pain or pressure. Continuing to wean on pressure control ventilation.  REVIEW OF SYSTEMS:  No nausea, vomiting, diarrhea, or abdominal pain. No subjective fever or chills. No sweats.  VITAL SIGNS: Temp:  [97.9 F (36.6 C)-99 F (37.2 C)] 97.9 F (36.6 C) (09/29 0800) Pulse Rate:  [59-98] 80 (09/29 1000) Resp:  [15-32] 22 (09/29 1000) BP: (99-131)/(65-84) 107/76 mmHg (09/29 1000) SpO2:  [91 %-98 %] 96 % (09/29 0900) FiO2 (%):  [40 %] 40 % (09/29 0843) Weight:  [284 lb 13.4 oz (129.2 kg)] 284 lb 13.4 oz (129.2 kg) (09/29 0600)   VENTILATOR SETTINGS: Vent Mode:  [-] PCV FiO2 (%):  [40 %] 40 % Set Rate:  [26 bmp] 26 bmp PEEP:  [10 cmH20] 10 cmH20 Plateau Pressure:  [15 cmH20-23 cmH20] 15 cmH20   INTAKE / OUTPUT:  Intake/Output Summary (Last 24 hours) at 02/26/15 1024 Last data filed at 02/26/15 1000  Gross per 24 hour  Intake   3780 ml  Output   3240 ml  Net    540 ml    PHYSICAL EXAMINATION: General:  Sitting in bed. No distress. Alert. Appears relaxed. Integument:  Warm & dry. No rash on exposed skin.  HEENT:  Trach in place. No scleral injection. NG tube in place. Cardiovascular:  Regular rate. No appreciated murmurs, rubs, or gallops. No edema. Pulmonary:  Slightly decreased breath sounds bilateral lung bases. Normal  work of breathing on pressure control ventilation. Abdomen: Soft. Normal bowel sounds. Nontender. Protuberant. Neurological:  Cranial nerves 2-12 intact grossly. No meningismus. Moving all 4 extremities equally.  CMP Latest Ref Rng 02/26/2015 02/25/2015 02/24/2015  Glucose 65 - 99 mg/dL 147(W) 295(A) 213(Y)  BUN 6 - 20 mg/dL 86(V) 78(I) 69(G)  Creatinine 0.61 - 1.24 mg/dL 2.95 2.84 1.32  Sodium 135 - 145 mmol/L 142 149(H) 147(H)  Potassium 3.5 - 5.1 mmol/L 4.3 3.7 3.5  Chloride 101 - 111 mmol/L 97(L) 101 97(L)  CO2 22 - 32 mmol/L 37(H) 39(H) 40(H)  Calcium 8.9 - 10.3 mg/dL 4.4(W) 1.0(U) 7.2(Z)  Total Protein 6.5 - 8.1 g/dL - - -  Total Bilirubin 0.3 - 1.2 mg/dL - - -  Alkaline Phos 38 - 126 U/L - - -  AST 15 - 41 U/L - - -  ALT 17 - 63 U/L - - -    CBC Latest Ref Rng 02/26/2015 02/25/2015 02/24/2015  WBC 4.0 - 10.5 K/uL 9.5 10.6(H) 9.5  Hemoglobin 13.0 - 17.0 g/dL 11.0(L) 10.7(L) 9.4(L)  Hematocrit 39.0 - 52.0 % 38.2(L) 38.1(L) 33.8(L)  Platelets 150 - 400 K/uL 292 306 310    CBG (last 3)   Recent Labs  02/26/15 0005 02/26/15 0356 02/26/15 0755  GLUCAP 235* 234* 228*   ABG    Component Value Date/Time   PHART 7.466* 02/23/2015 3664  PCO2ART 55.6* 02/23/2015 0442   PO2ART 67.6* 02/23/2015 0442   HCO3 39.5* 02/23/2015 0442   TCO2 41.2 02/23/2015 0442   O2SAT 92.5 02/23/2015 0442     Dg Abd 1 View  02/24/2015   CLINICAL DATA:  Feeding tube placement  EXAM: ABDOMEN - 1 VIEW  COMPARISON:  02/24/2015  FINDINGS: Dobbhoff feeding tube crosses the gastroesophageal junction with tip over the body of the stomach.  IMPRESSION: Dobbhoff feeding tube as described   Electronically Signed   By: Esperanza Heir M.D.   On: 02/24/2015 13:29   Dg Abd 1 View  02/24/2015   CLINICAL DATA:  Feeding tube placement  EXAM: ABDOMEN - 1 VIEW  COMPARISON:  02/21/2015  FINDINGS: No feeding tube is identified on this study. Images extend from the level of the carina through the middle pelvis.  Nonobstructive gas pattern.  IMPRESSION: No feeding tube identified.   Electronically Signed   By: Esperanza Heir M.D.   On: 02/24/2015 11:03    LINES/TUBES: ETT 9/4 >> 9/20 R IJ CVL 9/4 >> 9/9 L IJ CVL 9/9 >> 9/24 picc 9/24>> Trach 9/20 >>  CULTURES: C diff 9/13 >> Antigen positive, toxin negative >> colonization Blood 9/22 >> negative Sputum 9/23 >> few C albicans Urine 9/23 >> multiple species  ASSESSMENT / PLAN:  PULMONARY Trach change to XLT 9/21>> A: Acute on chronic hypoxic respiratory failure - Pneumonia. Improving. Ventilator Dependent Respiratory Failure - s/p tracheostomy  H/O Asthma  P: Continue Vent Bundle Wean FiO2 on pressure control before PEEP Continue Solu-medrol to q12hr & plan to transition to once daily tomorrow Continuing Xopenex nebs q6hr Gentle diuresis with Lasix 20 mg IV 1 today  CARDIOLOGY PICC 9/24>> A: A fib with RVR - CHA2DS2-VASc score 1. Acute Diastolic CHF - resolved  P: Diltiazem 90mg  via tube q6hr ASA 325mg  daily Monitor on tele Gentle diuresis with Lasix 20 mg IV 1 today  RENAL A: Acute on Chronic Renal Failure (CKD stage III) - Resolved Hypernatremia - resolved Hypokalemia - Resolved  P: Continue free water q6hr Monitoring UOP Trending renal function daily with BUN/Creatinine Daily electrolyte panel  GASTROENTEROLOGY A: No acute issues.  P: Continuing Tube Feeds Pepcid via tube q12hr  HEMATOLOGY A: Anemia - improving. Secondary to cricital illness. No signs of active bleeding.  P: Heparin [place] q8hr SCDs Monitor Hgb daily w/ CBC  INFECTION A: Severe CAP - No organism identified. Treated with vancomycin & Merrem. Septic shock - resolved. C diff colonization  P: Plan to reculture for any fever  ENDOCRINE A: Hyperglycemia - Likely steroid induced. Unontrolled.  P: Continuing SSI Accuchecks q4hr Increasing Lantus to 30 units daily at bedtime NPH 10 units subcutaneous once this  morning  NEUROLOGY A: Delirium - resolved Chronic Pain - on Methadone  P: Continue Lyrica 200mg  po q8hr (home dose) Methadone 5mg  po q6hr  Fentanyl IV prn  FAMILY UPDATE:  No family at bedside.  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 Seems to be improving clinicallyand ventilator requirement continues to wean . Mental status  changes resolved.  Increasing subcutaneous insulin for blood glucose control. Gentle diuresis with Lasix IV 1 today while monitoring electrolytes daily. Transfer patient to stepdown unit and will remain on PCCM service.  Donna Christen Jamison Neighbor, M.D. [facility] Pulmonary & Critical Care Pager:  [phone] After 3pm or if no response, call [phone]  02/26/2015, 10:24 AM

## 2015-02-26 NOTE — Progress Notes (Signed)
Pt. Seen for trach consult, all equipment at bedside, no education needed. Will continue to follow.

## 2015-02-26 NOTE — Progress Notes (Signed)
Results for DAEJOHN, AUMENT (MRN [id number]) as of 02/26/2015 08:36  Ref. Range 02/25/2015 11:44 02/25/2015 15:49 02/25/2015 19:40 02/26/2015 00:05 02/26/2015 03:56  Glucose-Capillary Latest Ref Range: 65-99 mg/dL 579 (H) 038 (H) 333 (H) 235 (H) 234 (H)  CBGs continue to be greater than 180 mg/dl.  Recommend increasing Lantus to 15 units daily.  May need to increase Novolog correction scale to MODERATE every 4 hours while NPO. Will continue to monitor blood sugars while in the hospital. Ringley Mince RN BSN CDE

## 2015-02-26 NOTE — Evaluation (Addendum)
Passy-Muir Speaking Valve - Evaluation Patient Details  Name: Adam Benson MRN: [id number] Date of Birth: [birth year]  Today's Date: 02/26/2015 Time: 1410-1425 SLP Time Calculation (min) (ACUTE ONLY): 15 min  Past Medical History:  Past Medical History  Diagnosis Date  . Gout   . HTN (hypertension), benign   . Chronic atrial fibrillation   . Hyperlipemia   . Nerve damage     right arm  . Obesity   . Insomnia   . GERD (gastroesophageal reflux disease)   . History of pneumonia 05/2014     two month hosp stay, vent, trach   . 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 Peptic ulcer 04/2014   Past Surgical History:  Past Surgical History  Procedure Laterality Date  . Arm surgery      right  . Appendectomy    . Knee surgery      right   HPI:  54 yo male from [place] with dyspnea, wt gain, edema. Developed VDRF from PNA and ARDS, a fib with RVR. Trach placed on 9/20, pt has remained on ventilator.    Assessment / Plan / Recommendation Clinical Impression  Pt demonstrates excellent tolerance of in-line PMSV while on the ventilator with Pressure Control. Prior to cuff deflation, pt speaking around cuff due to leak. Volume and breath support significantly improved with PMSV and SLP was able to proceed with swallow eval. Over 15 session, O2 saturation did progressively drop from 100 to 92 and improved with restoration of prior settings. Pt not particularly impressed by valve and was not interested in calling family or communicating anything in particular. Will continue efforts with PMSV and swallowing with goal of staff supervision for communication and PO intake.     SLP Assessment  Patient needs continued Speech Lanaguage Pathology Services    Follow Up Recommendations  Inpatient Rehab    Frequency and Duration min 3x week  2 weeks   Pertinent Vitals/Pain NA    SLP Goals Potential to Achieve Goals (ACUTE ONLY): Good   PMSV Trial  PMSV  was placed for: 15 minutes Able to redirect subglottic air through upper airway: Yes Able to Attain Phonation: Yes Voice Quality: Normal Able to Expectorate Secretions: No attempts Breath Support for Phonation: Adequate Intelligibility: Intelligible Respirations During Trial: 22 SpO2 During Trial: 92 % Pulse During Trial: 99 Behavior: Angry;Controlled;Cooperative   Tracheostomy Tube  Additional Tracheostomy Tube Assessment Fenestrated: No    Vent Dependency  Vent Dependent: Yes Vent Mode: PCV Set Rate: 26 bmp PEEP: 10 cmH20 FiO2 (%): 40 %    Cuff Deflation Trial Tolerated Cuff Deflation: Yes Length of Time for Cuff Deflation Trial: 15 minutes Behavior: Alert;Cooperative   Kiondre Grenz, Riley Nearing 02/26/2015, 2:42 PM

## 2015-02-27 LAB — CBC WITH DIFFERENTIAL/PLATELET
BASOS PCT: 0 %
Basophils Absolute: 0 10*3/uL (ref 0.0–0.1)
EOS ABS: 0 10*3/uL (ref 0.0–0.7)
Eosinophils Relative: 0 %
HCT: 38.1 % — ABNORMAL LOW (ref 39.0–52.0)
HEMOGLOBIN: 11.2 g/dL — AB (ref 13.0–17.0)
LYMPHS ABS: 0.3 10*3/uL — AB (ref 0.7–4.0)
LYMPHS PCT: 2 %
MCH: 21.8 pg — AB (ref 26.0–34.0)
MCHC: 29.4 g/dL — AB (ref 30.0–36.0)
MCV: 74.3 fL — ABNORMAL LOW (ref 78.0–100.0)
Monocytes Absolute: 0.6 10*3/uL (ref 0.1–1.0)
Monocytes Relative: 4 %
NEUTROS ABS: 13 10*3/uL — AB (ref 1.7–7.7)
Neutrophils Relative %: 94 %
Platelets: 274 10*3/uL (ref 150–400)
RBC: 5.13 MIL/uL (ref 4.22–5.81)
RDW: 24.1 % — AB (ref 11.5–15.5)
WBC: 13.9 10*3/uL — ABNORMAL HIGH (ref 4.0–10.5)

## 2015-02-27 LAB — RENAL FUNCTION PANEL
ANION GAP: 9 (ref 5–15)
Albumin: 2.2 g/dL — ABNORMAL LOW (ref 3.5–5.0)
BUN: 42 mg/dL — ABNORMAL HIGH (ref 6–20)
CHLORIDE: 95 mmol/L — AB (ref 101–111)
CO2: 34 mmol/L — AB (ref 22–32)
CREATININE: 0.84 mg/dL (ref 0.61–1.24)
Calcium: 8.3 mg/dL — ABNORMAL LOW (ref 8.9–10.3)
GFR calc Af Amer: >60 mL/min (ref >60)
GFR calc non Af Amer: >60 mL/min (ref >60)
Glucose, Bld: 224 mg/dL — ABNORMAL HIGH (ref 65–99)
Phosphorus: 4.5 mg/dL (ref 2.5–4.6)
Potassium: 4.4 mmol/L (ref 3.5–5.1)
Sodium: 138 mmol/L (ref 135–145)

## 2015-02-27 LAB — MAGNESIUM: MAGNESIUM: 2.4 mg/dL (ref 1.7–2.4)

## 2015-02-27 LAB — GLUCOSE, CAPILLARY
GLUCOSE-CAPILLARY: 232 mg/dL — AB (ref 65–99)
GLUCOSE-CAPILLARY: 229 mg/dL — AB (ref 65–99)
GLUCOSE-CAPILLARY: 213 mg/dL — AB (ref 65–99)
GLUCOSE-CAPILLARY: 219 mg/dL — AB (ref 65–99)
Glucose-Capillary: 219 mg/dL — ABNORMAL HIGH (ref 65–99)
Glucose-Capillary: 211 mg/dL — ABNORMAL HIGH (ref 65–99)
Glucose-Capillary: 214 mg/dL — ABNORMAL HIGH (ref 65–99)

## 2015-02-27 MED ORDER — FUROSEMIDE 10 MG/ML IJ SOLN
20.0000 mg | Freq: Once | INTRAMUSCULAR | Status: AC
  Administered 2015-02-27: 20 mg via INTRAVENOUS
  Filled 2015-02-27: qty 2

## 2015-02-27 NOTE — Progress Notes (Signed)
Inpatient Diabetes Program Recommendations  AACE/ADA: New Consensus Statement on Inpatient Glycemic Control (2015)  Target Ranges:  Prepandial:   less than 140 mg/dL      [place] postprandial:   less than 180 mg/dL (1-2 hours)      Critically ill patients:  140 - 180 mg/dL   Results for Adam, Benson (MRN [id number]) as of 02/27/2015 08:35  Ref. Range 02/26/2015 07:55 02/26/2015 11:32 02/26/2015 15:58 02/27/2015 04:02  Glucose-Capillary Latest Ref Range: 65-99 mg/dL 832 (H) 549 (H) 826 (H) 219 (H)   Review of Glycemic Control  Current orders for Inpatient glycemic control: Lantus 30 units QHS, Novolog Sensitive Q4hrs  Also on Solumedrol 40 mg Q12hrs, Vital HP at 75/hr  Inpatient Diabetes Program Recommendations: Insulin - Tube Feed Coverage: Glucose still in the mid 200 range after basal insulin was increased to 30 units. Please consider starting Novolog 3 units Q4hrs Tube Feed Coverage.  Thanks,  Christena Deem RN, MSN, [facility] Inpatient Diabetes Coordinator Team Pager [phone] (8a-5p)

## 2015-02-27 NOTE — Progress Notes (Signed)
Physical Therapy Treatment Patient Details Name: Adam Benson MRN: [id number] DOB: 11/19/1960 Today's Date: 02/27/2015    History of Present Illness Pt is a 54 y/o male who was transferred from [facility] with dyspnea, weight gain and LE edema. He failed BiPap, was intubated 9/4 and transferred to [facility]. He was found to have CAP with ARDS. ICU couse has been complicated by a-fib with RVR and bradycardia.     PT Comments    Pt progressing slowly towards physical therapy goals. Mobility was limited due to respiratory status in sitting. Pt became very anxious and panicked, stating he could not breathe. RN entered and assisted in calming pt, however O2 sats were dropping into the low 80's at times. Pt was assisted back into supine and session was ended.  Follow Up Recommendations  SNF;Supervision/Assistance - 24 hour     Equipment Recommendations  Rolling walker with 5" wheels;Wheelchair (measurements PT);Wheelchair cushion (measurements PT)    Recommendations for Other Services       Precautions / Restrictions Precautions Precautions: Fall Precaution Comments: Trach, Vent, NG tube, Foley, Rectal tube, Multiple IV lines Restrictions Weight Bearing Restrictions: No    Mobility  Bed Mobility Overal bed mobility: Needs Assistance;+ 2 for safety/equipment Bed Mobility: Supine to Sit;Sit to Supine     Supine to sit: Supervision Sit to supine: Min assist   General bed mobility comments: HOB elevated. Assist for management of lines. Pt initiating transition to EOB without warning. Once sitting EOB pt became anxious, stating he couldn't breathe. Increased coughing noted. RN present and pt returned to supine.  Transfers                    Ambulation/Gait                 Stairs            Wheelchair Mobility    Modified Rankin (Stroke Patients Only)       Balance Overall balance assessment: Needs assistance Sitting-balance support: Feet supported;No  upper extremity supported Sitting balance-Leahy Scale: Fair                              Cognition Arousal/Alertness: Awake/alert Behavior During Therapy: Flat affect;Impulsive (Frustrated at times, impulsive with initiating transfers) Overall Cognitive Status: Within Functional Limits for tasks assessed                      Exercises      General Comments        Pertinent Vitals/Pain Pain Assessment: No/denies pain    Home Living                      Prior Function            PT Goals (current goals can now be found in the care plan section) Acute Rehab PT Goals Patient Stated Goal: Pt did not state goals at this time.  PT Goal Formulation: With patient Time For Goal Achievement: 02/25/15 Potential to Achieve Goals: Good Progress towards PT goals: Progressing toward goals    Frequency  Min 2X/week    PT Plan Discharge plan needs to be updated;Frequency needs to be updated    Co-evaluation             End of Session Equipment Utilized During Treatment: Oxygen Activity Tolerance: Treatment limited secondary to medical complications (Comment) (respiratory status in sitting) Patient  left: in bed;with call bell/phone within reach;with family/visitor present     Time: 8295-6213 PT Time Calculation (min) (ACUTE ONLY): 23 min  Charges:  $Therapeutic Activity: 23-37 mins                    G Codes:      Conni Slipper 03/20/15, 2:29 PM   Conni Slipper, PT, DPT Acute Rehabilitation Services Pager: [phone]

## 2015-02-27 NOTE — Clinical Social Work Note (Signed)
Clinical Social Worker has faxed FL-2 and clinicals to [facility] SN[facility] for review. CSW has also notified admissions coordinator to review information. Patient is NOT agreeable to extended vent SNF search outside of [place]  CSW remains available as needed.   Derenda Fennel, MSW, LCSWA [phone] 02/27/2015 11:53 AM

## 2015-02-27 NOTE — Progress Notes (Signed)
RT has attempted numerous times to suction patient. Patient refuses. RT explained the possible complications of not suctioning patient consistently. Patient still refuses stating "it hurts". RN aware. RT will continue to monitor.

## 2015-02-27 NOTE — Clinical Social Work Note (Signed)
[facility] SNF has reviewed patient's clinicals however facility currently does NOT have male beds available. CSW will follow up with [facility] SN[facility] early next week to check availability. Patient is NOT agreeable to extended SNF search outside of [facility] for vent SNF.  CSW remains available as needed.   Derenda Fennel, MSW, LCSWA [phone] 02/27/2015 2:36 PM

## 2015-02-27 NOTE — Progress Notes (Signed)
PULMONARY / CRITICAL CARE MEDICINE   Name: Adam Benson MRN: [id number] DOB: 01-01-1961    ADMISSION DATE:  02/01/2015 CONSULTATION DATE:  02/01/2015  REFERRING MD :  [facility]   CHIEF COMPLAINT:  Severe CAP  INITIAL PRESENTATION: 54 yo male from [place] with dyspnea, wt gain, edema.  Developed VDRF from PNA and ARDS, a fib with RVR.  STUDIES:  9/04 Echo > EF 50 to 55%  SIGNIFICANT EVENTS: 9/04 Intubated, transfer to [facility] 9/09 FOB, Fever 103 9/16 Repeat FOB 9/20 trach placed, fib rvr 9/21 leak, dislodged trach, changed to xtra long successful 9/24 moved to ICU and placed on PCV  SUBJECTIVE: Patient reports more dyspnea today with attempting to ambulate to bedside chair. Denies any chest pain or pressure. Intermittent nonproductive cough. No subjective fever, chills or sweats. Tolerating tube feedings.  REVIEW OF SYSTEMS:  No nausea, vomiting, diarrhea, or abdominal pain. No headache or vision changes.  VITAL SIGNS: Temp:  [97.3 F (36.3 C)-98.2 F (36.8 C)] 97.3 F (36.3 C) (09/30 1157) Pulse Rate:  [53-144] 90 (09/30 1506) Resp:  [15-34] 31 (09/30 1506) BP: (103-137)/(47-95) 130/47 mmHg (09/30 1506) SpO2:  [90 %-100 %] 96 % (09/30 1506) FiO2 (%):  [40 %] 40 % (09/30 1506)   VENTILATOR SETTINGS: Vent Mode:  [-] PCV FiO2 (%):  [40 %] 40 % Set Rate:  [26 bmp] 26 bmp PEEP:  [8 cmH20] 8 cmH20 Plateau Pressure:  [17 cmH20-28 cmH20] 17 cmH20   INTAKE / OUTPUT:  Intake/Output Summary (Last 24 hours) at 02/27/15 1507 Last data filed at 02/27/15 1500  Gross per 24 hour  Intake   3665 ml  Output   2500 ml  Net   1165 ml    PHYSICAL EXAMINATION: General:  Patient appears somewhat anxious. Laying in bed watching TV. No acute distress. Integument:  Warm & dry. No rash on exposed skin.  HEENT:  Trach in place. No scleral icterus. NG tube in place. Cardiovascular:  Regular rate. No appreciated murmurs, rubs, or gallops. No edema. No appreciable JVD given body  habitus. Pulmonary:  Normal work of breathing on pressure control ventilation. Symmetric chest wall rise. Coarse breath sounds bilaterally. Abdomen: Soft. Normal bowel sounds. Nontender. Protuberant. Neurological:  Cranial nerves 2-12 intact grossly. No meningismus. Moving all 4 extremities equally.  CMP Latest Ref Rng 02/27/2015 02/26/2015 02/25/2015  Glucose 65 - 99 mg/dL 559(R) 416(L) 845(X)  BUN 6 - 20 mg/dL 64(W) 80(H) 21(Y)  Creatinine 0.61 - 1.24 mg/dL 2.48 2.50 0.37  Sodium 135 - 145 mmol/L 138 142 149(H)  Potassium 3.5 - 5.1 mmol/L 4.4 4.3 3.7  Chloride 101 - 111 mmol/L 95(L) 97(L) 101  CO2 22 - 32 mmol/L 34(H) 37(H) 39(H)  Calcium 8.9 - 10.3 mg/dL 8.3(L) 8.4(L) 8.5(L)  Total Protein 6.5 - 8.1 g/dL - - -  Total Bilirubin 0.3 - 1.2 mg/dL - - -  Alkaline Phos 38 - 126 U/L - - -  AST 15 - 41 U/L - - -  ALT 17 - 63 U/L - - -    CBC Latest Ref Rng 02/27/2015 02/26/2015 02/25/2015  WBC 4.0 - 10.5 K/uL 13.9(H) 9.5 10.6(H)  Hemoglobin 13.0 - 17.0 g/dL 11.2(L) 11.0(L) 10.7(L)  Hematocrit 39.0 - 52.0 % 38.1(L) 38.2(L) 38.1(L)  Platelets 150 - 400 K/uL 274 292 306    CBG (last 3)   Recent Labs  02/27/15 0402 02/27/15 0824 02/27/15 1149  GLUCAP 219* 229* 213*   ABG    Component Value Date/Time  PHART 7.466* 02/23/2015 0442   PCO2ART 55.6* 02/23/2015 0442   PO2ART 67.6* 02/23/2015 0442   HCO3 39.5* 02/23/2015 0442   TCO2 41.2 02/23/2015 0442   O2SAT 92.5 02/23/2015 0442     No results found.  LINES/TUBES: ETT 9/4 >> 9/20 R IJ CVL 9/4 >> 9/9 L IJ CVL 9/9 >> 9/24 picc 9/24>> Trach 9/20 >>  CULTURES: C diff 9/13 >> Antigen positive, toxin negative >> colonization Blood 9/22 >> negative Sputum 9/23 >> few C albicans Urine 9/23 >> multiple species  ASSESSMENT / PLAN:  PULMONARY Trach change to XLT 9/21>> A: Acute on chronic hypoxic respiratory failure - Pneumonia. Improving. Ventilator Dependent Respiratory Failure - s/p tracheostomy  H/O  Asthma  P: Continue Vent Bundle Slowly weaning PEEP & FiO2 Discontinuing Solu-Medrol Continuing Xopenex nebs q6hr Gentle diuresis with Lasix 20 mg IV 1 today  CARDIOLOGY PICC 9/24>> A: A fib with RVR - CHA2DS2-VASc score 1. Acute Diastolic CHF - resolved  P: Diltiazem  via tube q6hr ASA  daily Monitor on tele Gentle diuresis with Lasix 20 mg IV 1 today Attempt to maintain even volume status  RENAL A: Acute on Chronic Renal Failure (CKD stage III) - Resolved Hypernatremia - resolved Hypokalemia - Resolved  P: Continue free water q6hr Monitoring UOP Trending renal function daily with BUN/Creatinine Daily electrolyte panel  GASTROENTEROLOGY A: No acute issues.  P: Continuing Tube Feeds Pepcid via tube q12hr  HEMATOLOGY A: Anemia - improving. Secondary to cricital illness. No signs of active bleeding. Leukocytosis - unclear etiology. No fever yet.  P: Heparin [place] q8hr SCDs Monitor Hgb daily w/ CBC Trending daily leukocytosis with CBC  INFECTION A: Severe CAP - No organism identified. Treated with vancomycin & Merrem. Septic shock - resolved. C diff colonization  P: Plan to reculture for any fever  ENDOCRINE A: Hyperglycemia - Likely steroid induced. Unontrolled but suspect will improve as I'm discontinuing Solu-Medrol today.  P: Continuing SSI Accuchecks q4hr Continuing Lantus to 30 units daily at bedtime  NEUROLOGY A: Delirium - resolved Chronic Pain - on Methadone  P: Continue Lyrica  po q8hr (home dose) Methadone  po q6hr  Fentanyl IV prn  FAMILY UPDATE:  No family at bedside.  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 Seems to be improving clinicallyand ventilator requirement continues to wean . Mental status  changes resolved. I'm discontinuing the patient's Solu-Medrol today. Continuing to wean on pressure control ventilator support. Continuing the patient on his current dose of  Lantus as I suspect his hyperglycemia will improve off of steroids. I attempted to reassure the patient that he will have more dyspnea with decreased ventilator support.  Donna Christen Jamison Neighbor, M.D. [facility] Pulmonary & Critical Care Pager:  [phone] After 3pm or if no response, call [phone]  02/27/2015, 3:07 PM

## 2015-02-28 LAB — CBC WITH DIFFERENTIAL/PLATELET
BASOS ABS: 0 10*3/uL (ref 0.0–0.1)
BASOS PCT: 0 %
EOS ABS: 0 10*3/uL (ref 0.0–0.7)
Eosinophils Relative: 0 %
HEMATOCRIT: 35.8 % — AB (ref 39.0–52.0)
HEMOGLOBIN: 10.9 g/dL — AB (ref 13.0–17.0)
Lymphocytes Relative: 4 %
Lymphs Abs: 0.7 10*3/uL (ref 0.7–4.0)
MCH: 22.4 pg — ABNORMAL LOW (ref 26.0–34.0)
MCHC: 30.4 g/dL (ref 30.0–36.0)
MCV: 73.7 fL — AB (ref 78.0–100.0)
MONO ABS: 0.7 10*3/uL (ref 0.1–1.0)
Monocytes Relative: 4 %
NEUTROS PCT: 92 %
Neutro Abs: 17 10*3/uL — ABNORMAL HIGH (ref 1.7–7.7)
Platelets: 280 10*3/uL (ref 150–400)
RBC: 4.86 MIL/uL (ref 4.22–5.81)
RDW: 24.8 % — AB (ref 11.5–15.5)
WBC: 18.4 10*3/uL — ABNORMAL HIGH (ref 4.0–10.5)

## 2015-02-28 LAB — GLUCOSE, CAPILLARY
GLUCOSE-CAPILLARY: 177 mg/dL — AB (ref 65–99)
GLUCOSE-CAPILLARY: 184 mg/dL — AB (ref 65–99)
Glucose-Capillary: 146 mg/dL — ABNORMAL HIGH (ref 65–99)
Glucose-Capillary: 149 mg/dL — ABNORMAL HIGH (ref 65–99)
Glucose-Capillary: 139 mg/dL — ABNORMAL HIGH (ref 65–99)
Glucose-Capillary: 138 mg/dL — ABNORMAL HIGH (ref 65–99)

## 2015-02-28 LAB — RENAL FUNCTION PANEL
ALBUMIN: 2.1 g/dL — AB (ref 3.5–5.0)
ANION GAP: 8 (ref 5–15)
BUN: 36 mg/dL — ABNORMAL HIGH (ref 6–20)
CALCIUM: 8.2 mg/dL — AB (ref 8.9–10.3)
CO2: 32 mmol/L (ref 22–32)
Chloride: 96 mmol/L — ABNORMAL LOW (ref 101–111)
Creatinine, Ser: 0.78 mg/dL (ref 0.61–1.24)
GFR calc non Af Amer: >60 mL/min (ref >60)
Glucose, Bld: 178 mg/dL — ABNORMAL HIGH (ref 65–99)
PHOSPHORUS: 4.1 mg/dL (ref 2.5–4.6)
POTASSIUM: 4.5 mmol/L (ref 3.5–5.1)
SODIUM: 136 mmol/L (ref 135–145)

## 2015-02-28 LAB — MAGNESIUM: Magnesium: 2.3 mg/dL (ref 1.7–2.4)

## 2015-02-28 MED ORDER — FREE WATER
400.0000 mL | Freq: Four times a day (QID) | Status: DC
Start: 2015-02-28 — End: 2015-03-03
  Administered 2015-02-28 – 2015-03-03 (×13): 400 mL

## 2015-02-28 MED ORDER — FUROSEMIDE 10 MG/ML IJ SOLN
20.0000 mg | Freq: Once | INTRAMUSCULAR | Status: AC
  Administered 2015-02-28: 20 mg via INTRAVENOUS
  Filled 2015-02-28: qty 2

## 2015-02-28 MED ORDER — PREGABALIN 100 MG PO CAPS
200.0000 mg | ORAL_CAPSULE | Freq: Three times a day (TID) | ORAL | Status: DC
  Administered 2015-02-28 – 2015-03-07 (×23): 200 mg
  Filled 2015-02-28 (×23): qty 2

## 2015-02-28 MED ORDER — FENTANYL CITRATE (PF) 100 MCG/2ML IJ SOLN
50.0000 ug | INTRAMUSCULAR | Status: DC | PRN
  Administered 2015-03-01 – 2015-03-02 (×6): 100 ug via INTRAVENOUS
  Filled 2015-02-28 (×6): qty 2

## 2015-02-28 MED ORDER — DILTIAZEM 12 MG/ML ORAL SUSPENSION
60.0000 mg | Freq: Four times a day (QID) | ORAL | Status: DC
  Administered 2015-02-28: 60 mg
  Filled 2015-02-28 (×4): qty 6

## 2015-02-28 MED ORDER — LEVALBUTEROL HCL 0.63 MG/3ML IN NEBU
0.6300 mg | INHALATION_SOLUTION | RESPIRATORY_TRACT | Status: DC | PRN
  Administered 2015-03-02 (×2): 0.63 mg via RESPIRATORY_TRACT
  Filled 2015-02-28 (×3): qty 3

## 2015-02-28 MED ORDER — DILTIAZEM 12 MG/ML ORAL SUSPENSION
30.0000 mg | Freq: Four times a day (QID) | ORAL | Status: DC
  Administered 2015-03-01 – 2015-03-05 (×19): 30 mg
  Filled 2015-02-28 (×25): qty 3

## 2015-02-28 MED ORDER — METHADONE HCL 5 MG PO TABS
5.0000 mg | ORAL_TABLET | Freq: Three times a day (TID) | ORAL | Status: DC
  Administered 2015-02-28 – 2015-03-04 (×12): 5 mg via ORAL
  Filled 2015-02-28 (×12): qty 1

## 2015-02-28 MED ORDER — FREE WATER: 300.0000 mL | Freq: Four times a day (QID) | Status: DC

## 2015-02-28 NOTE — Progress Notes (Signed)
eLink Physician-Brief Progress Note Patient Name: Adam Benson DOB: 07-Apr-1961 MRN: [id number]   Date of Service  02/28/2015  HPI/Events of Note  Concerns about bradycardia from RN. HR into the 40's with sleep.  eICU Interventions  Decreased diltiazem from 60mg  to 30mg         Emeterio Reeve 02/28/2015, 6:21 PM

## 2015-02-28 NOTE — Progress Notes (Signed)
Patients PICC line dressing changed, using sterile technique.  Patient tolerated well.  No s/s of infection.

## 2015-02-28 NOTE — Progress Notes (Signed)
PULMONARY / CRITICAL CARE MEDICINE   Name: Adam Benson MRN: [id number] DOB: 1960-07-21    ADMISSION DATE:  02/01/2015 CONSULTATION DATE:  02/01/2015  REFERRING MD :  [facility]   CHIEF COMPLAINT:  Severe CAP  INITIAL PRESENTATION: 54 yo male from [place] with dyspnea, wt gain, edema.  Developed VDRF from PNA and ARDS, a fib with RVR.  STUDIES:  9/04 Echo > EF 50 to 55%  SIGNIFICANT EVENTS: 9/04 Intubated, transfer to [facility] 9/09 FOB, Fever 103 9/16 Repeat FOB 9/20 trach placed, fib rvr 9/21 leak, dislodged trach, changed to xtra long successful 9/24 moved to ICU and placed on PCV  SUBJECTIVE: Up in chair this am  On vent.   Tolerating TF .  Dyspnea at baseline. Able to talk despite trach/vent .  Episodes of bradycardia overnight    VITAL SIGNS: Temp:  [97.3 F (36.3 C)-98.3 F (36.8 C)] 97.9 F (36.6 C) (10/01 0317) Pulse Rate:  [46-144] 91 (10/01 0744) Resp:  [17-34] 28 (10/01 0744) BP: (94-137)/(47-95) 106/68 mmHg (10/01 0744) SpO2:  [93 %-100 %] 99 % (10/01 0744) FiO2 (%):  [40 %] 40 % (10/01 0745) Weight:  [130.7 kg (288 lb 2.3 oz)] 130.7 kg (288 lb 2.3 oz) (10/01 0600)   VENTILATOR SETTINGS: Vent Mode:  [-] PCV FiO2 (%):  [40 %] 40 % Set Rate:  [26 bmp] 26 bmp PEEP:  [8 cmH20] 8 cmH20 Plateau Pressure:  [15 cmH20-24 cmH20] 24 cmH20   INTAKE / OUTPUT:  Intake/Output Summary (Last 24 hours) at 02/28/15 0748 Last data filed at 02/28/15 0600  Gross per 24 hour  Intake   4405 ml  Output   3750 ml  Net    655 ml    PHYSICAL EXAMINATION: General:  Up in chair on vent  Integument:  Warm & dry. No rash on exposed skin.  HEENT:  Trach in place. No scleral icterus. NG tube in place. Cardiovascular:  Regular rate. No appreciated murmurs, rubs, or gallops. No edema. No appreciable JVD given body habitus. Pulmonary:   Symmetric chest wall rise. Coarse breath sounds bilaterally. Abdomen: Soft. Normal bowel sounds. Nontender. Protuberant. Neurological:   No  meningismus. Moving all 4 extremities equally.  Follows commands and alert   CMP Latest Ref Rng 02/28/2015 02/27/2015 02/26/2015  Glucose 65 - 99 mg/dL 431(V) 400(Q) 676(P)  BUN 6 - 20 mg/dL 95(K) 93(O) 67(T)  Creatinine 0.61 - 1.24 mg/dL 2.45 8.09 9.83  Sodium 135 - 145 mmol/L 136 138 142  Potassium 3.5 - 5.1 mmol/L 4.5 4.4 4.3  Chloride 101 - 111 mmol/L 96(L) 95(L) 97(L)  CO2 22 - 32 mmol/L 32 34(H) 37(H)  Calcium 8.9 - 10.3 mg/dL 8.2(L) 8.3(L) 8.4(L)  Total Protein 6.5 - 8.1 g/dL - - -  Total Bilirubin 0.3 - 1.2 mg/dL - - -  Alkaline Phos 38 - 126 U/L - - -  AST 15 - 41 U/L - - -  ALT 17 - 63 U/L - - -    CBC Latest Ref Rng 02/28/2015 02/27/2015 02/26/2015  WBC 4.0 - 10.5 K/uL 18.4(H) 13.9(H) 9.5  Hemoglobin 13.0 - 17.0 g/dL 10.9(L) 11.2(L) 11.0(L)  Hematocrit 39.0 - 52.0 % 35.8(L) 38.1(L) 38.2(L)  Platelets 150 - 400 K/uL 280 274 292    CBG (last 3)   Recent Labs  02/27/15 1924 02/28/15 0017 02/28/15 0316  GLUCAP 214* 184* 177*   ABG    Component Value Date/Time   PHART 7.466* 02/23/2015 0442   PCO2ART 55.6* 02/23/2015 0442  PO2ART 67.6* 02/23/2015 0442   HCO3 39.5* 02/23/2015 0442   TCO2 41.2 02/23/2015 0442   O2SAT 92.5 02/23/2015 0442     No results found.  LINES/TUBES: ETT 9/4 >> 9/20 R IJ CVL 9/4 >> 9/9 L IJ CVL 9/9 >> 9/24 picc 9/24>> Trach 9/20 >>  CULTURES: C diff 9/13 >> Antigen positive, toxin negative >> colonization Blood 9/22 >> negative Sputum 9/23 >> few C albicans Urine 9/23 >> multiple species  ASSESSMENT / PLAN:  PULMONARY Trach change to XLT 9/21>> A: Acute on chronic hypoxic respiratory failure - Pneumonia. Improving. Ventilator Dependent Respiratory Failure - s/p tracheostomy  H/O Asthma 10/1 >cont to talk around trach   P: Continue Vent Bundle Slowly weaning PEEP & FiO2 Continuing Xopenex nebs q6hr Monitor trach/cuff for leaks  Check cxr   CARDIOLOGY PICC 9/24>> A: A fib with RVR - CHA2DS2-VASc score 1. Acute  Diastolic CHF - resolved 10/1 : remains in + bal  P: Decrease Diltiazem  via tube q6hr ASA  daily Monitor on tele Gentle diuresis with Lasix 20 mg IV 1 today Attempt to maintain even volume status  RENAL A: Acute on Chronic Renal Failure (CKD stage III) - Resolved Hypernatremia - resolved Hypokalemia - Resolved  P: Decrease free water q6hr Monitoring UOP Trending renal function daily with BUN/Creatinine Daily electrolyte panel  GASTROENTEROLOGY A: Diarrhea w/ flexiseal /CDiff colonization   P: Continuing Tube Feeds Pepcid via tube q12hr  HEMATOLOGY A: Anemia - improving. Secondary to cricital illness. No signs of active bleeding. Leukocytosis - unclear etiology. No fever yet.  P: Heparin [place] q8hr SCDs Monitor Hgb daily w/ CBC Trending daily leukocytosis with CBC  INFECTION A: Severe CAP - No organism identified. Treated with vancomycin & Merrem. Septic shock - resolved. C diff colonization  P: Plan to reculture for any fever Tr wbc and temp   ENDOCRINE A: Hyperglycemia - Likely steroid induced.  >should improve with steriods stopped on 9/30  P: Continuing SSI Accuchecks q4hr Continuing Lantus to 30 units daily at bedtime  NEUROLOGY A: Delirium - resolved Chronic Pain - on Methadone  P: Continue Lyrica  po q8hr (home dose) Methadone  po q6hr  Fentanyl IV prn  FAMILY UPDATE:  No family at bedside.  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 Seems to be improving clinicallyand ventilator requirement continues to wean .  Marland Kitchen Continuing to wean on pressure control ventilator support.  Monitor trach/cuff as remains able to speak around trach/vent. Bradycardia overnight will decrease cardizem dose. Gentle diuresis with lasix to keep neg/even bal and avoid vol overload. Decrease Free water freq.   Tammy Parrett NP-C  [facility]  [phone]   Reviewed above.  He feels  comfortable with breathing, and tolerated some pressure support.  Denies chest/abd pain.  Sitting in chair, pleasant, able to speak around trach, HR regular, b/l crackles, abd soft.  Okay to wean on pressure support to TC as tolerated.  Adjust PEEP/FiO2 to keep SpO2 90 to 95%.  F/u CXR.    Updated pt's family at bedside.  Coralyn Helling, MD [facility] Pulmonary/Critical Care 02/28/2015, 1:34 PM Pager:  [phone] After 3pm call: [phone]

## 2015-03-01 ENCOUNTER — Inpatient Hospital Stay (HOSPITAL_COMMUNITY): Payer: Medicaid Other

## 2015-03-01 LAB — BASIC METABOLIC PANEL
ANION GAP: 6 (ref 5–15)
BUN: 36 mg/dL — AB (ref 6–20)
CO2: 33 mmol/L — AB (ref 22–32)
Calcium: 7.9 mg/dL — ABNORMAL LOW (ref 8.9–10.3)
Chloride: 96 mmol/L — ABNORMAL LOW (ref 101–111)
Creatinine, Ser: 0.77 mg/dL (ref 0.61–1.24)
GFR calc Af Amer: >60 mL/min (ref >60)
GLUCOSE: 104 mg/dL — AB (ref 65–99)
Potassium: 4 mmol/L (ref 3.5–5.1)
Sodium: 135 mmol/L (ref 135–145)

## 2015-03-01 LAB — CBC
HEMATOCRIT: 35.5 % — AB (ref 39.0–52.0)
Hemoglobin: 10.6 g/dL — ABNORMAL LOW (ref 13.0–17.0)
MCH: 22.4 pg — AB (ref 26.0–34.0)
MCHC: 29.9 g/dL — ABNORMAL LOW (ref 30.0–36.0)
MCV: 74.9 fL — AB (ref 78.0–100.0)
PLATELETS: 220 10*3/uL (ref 150–400)
RBC: 4.74 MIL/uL (ref 4.22–5.81)
RDW: 25.1 % — AB (ref 11.5–15.5)
WBC: 15.1 10*3/uL — ABNORMAL HIGH (ref 4.0–10.5)

## 2015-03-01 LAB — MAGNESIUM: Magnesium: 2.1 mg/dL (ref 1.7–2.4)

## 2015-03-01 LAB — GLUCOSE, CAPILLARY
GLUCOSE-CAPILLARY: 115 mg/dL — AB (ref 65–99)
GLUCOSE-CAPILLARY: 94 mg/dL (ref 65–99)
GLUCOSE-CAPILLARY: 103 mg/dL — AB (ref 65–99)
GLUCOSE-CAPILLARY: 82 mg/dL (ref 65–99)
Glucose-Capillary: 112 mg/dL — ABNORMAL HIGH (ref 65–99)

## 2015-03-01 NOTE — Progress Notes (Signed)
PULMONARY / CRITICAL CARE MEDICINE   Name: Adam Benson MRN: [id number] DOB: 28-Apr-1961    ADMISSION DATE:  02/01/2015 CONSULTATION DATE:  02/01/2015  REFERRING MD :  [facility]   CHIEF COMPLAINT:  Severe CAP  INITIAL PRESENTATION: 54 yo male from [place] with dyspnea, wt gain, edema.  Developed VDRF from PNA and ARDS, a fib with RVR.  STUDIES:  9/04 Echo > EF 50 to 55%  SIGNIFICANT EVENTS: 9/04 Intubated, transfer to [facility] 9/09 FOB, Fever 103 9/16 Repeat FOB 9/20 trach placed, fib rvr 9/21 leak, dislodged trach, changed to xtra long successful 9/24 moved to ICU and placed on PCV 10/2- to stepdown  SUBJECTIVE: Sitting up in bed  On vent.   Tolerating TF . Family in room Dyspnea at baseline. Able to talk despite trach/vent .  Episodes of bradycardia overnight - ELink reduced diltiazem   VITAL SIGNS: Temp:  [97.5 F (36.4 C)-98.4 F (36.9 C)] 98.2 F (36.8 C) (10/02 0930) Pulse Rate:  [59-111] 102 (10/02 0930) Resp:  [19-31] 20 (10/02 0930) BP: (95-133)/(49-82) 97/64 mmHg (10/02 0930) SpO2:  [94 %-100 %] 98 % (10/02 0930) FiO2 (%):  [40 %] 40 % (10/02 0927) Weight:  [129.4 kg (285 lb 4.4 oz)] 129.4 kg (285 lb 4.4 oz) (10/02 0600)   VENTILATOR SETTINGS: Vent Mode:  [-] PCV FiO2 (%):  [40 %] 40 % Set Rate:  [26 bmp] 26 bmp PEEP:  [8 cmH20] 8 cmH20 Plateau Pressure:  [16 cmH20-22 cmH20] 16 cmH20   INTAKE / OUTPUT:  Intake/Output Summary (Last 24 hours) at 03/01/15 1058 Last data filed at 03/01/15 0800  Gross per 24 hour  Intake   3070 ml  Output   2850 ml  Net    220 ml    PHYSICAL EXAMINATION: General:  In bed on vent  Integument:  Warm & dry. No rash on exposed skin.  HEENT:  Trach in place. No scleral icterus. NG tube in place. Speaks around cuff/ minimal leak Cardiovascular:  Regular rate. No appreciated murmurs, rubs, or gallops. No edema. No appreciable JVD given body habitus. Pulmonary:   Symmetric chest wall rise. Coarse breath sounds  bilaterally. Abdomen: Soft. Normal bowel sounds. Nontender. Protuberant. Neurological:   No meningismus. Moving all 4 extremities equally.  Follows commands and alert   CMP Latest Ref Rng 03/01/2015 02/28/2015 02/27/2015  Glucose 65 - 99 mg/dL 837(G) 902(X) 115(Z)  BUN 6 - 20 mg/dL 20(E) 02(M) 33(K)  Creatinine 0.61 - 1.24 mg/dL 1.22 4.49 7.53  Sodium 135 - 145 mmol/L 135 136 138  Potassium 3.5 - 5.1 mmol/L 4.0 4.5 4.4  Chloride 101 - 111 mmol/L 96(L) 96(L) 95(L)  CO2 22 - 32 mmol/L 33(H) 32 34(H)  Calcium 8.9 - 10.3 mg/dL 7.9(L) 8.2(L) 8.3(L)  Total Protein 6.5 - 8.1 g/dL - - -  Total Bilirubin 0.3 - 1.2 mg/dL - - -  Alkaline Phos 38 - 126 U/L - - -  AST 15 - 41 U/L - - -  ALT 17 - 63 U/L - - -    CBC Latest Ref Rng 03/01/2015 02/28/2015 02/27/2015  WBC 4.0 - 10.5 K/uL 15.1(H) 18.4(H) 13.9(H)  Hemoglobin 13.0 - 17.0 g/dL 10.6(L) 10.9(L) 11.2(L)  Hematocrit 39.0 - 52.0 % 35.5(L) 35.8(L) 38.1(L)  Platelets 150 - 400 K/uL 220 280 274    CBG (last 3)   Recent Labs  02/28/15 2347 03/01/15 0341 03/01/15 0802  GLUCAP 112* 115* 94   ABG    Component Value Date/Time  PHART 7.466* 02/23/2015 0442   PCO2ART 55.6* 02/23/2015 0442   PO2ART 67.6* 02/23/2015 0442   HCO3 39.5* 02/23/2015 0442   TCO2 41.2 02/23/2015 0442   O2SAT 92.5 02/23/2015 0442     Dg Chest Port 1 View  03/01/2015   CLINICAL DATA:  Pneumonia  EXAM: PORTABLE CHEST 1 VIEW  COMPARISON:  02/23/2015  FINDINGS: Tracheostomy in good position. Right arm PICC tip in the SVC unchanged. Feeding tube in the stomach.  Mild improvement in diffuse bilateral airspace disease, basilar predominant. No effusion.  IMPRESSION: Mild improvement in bilateral airspace disease.   Electronically Signed   By: Marlan Palau M.D.   On: 03/01/2015 07:11    LINES/TUBES: ETT 9/4 >> 9/20 R IJ CVL 9/4 >> 9/9 L IJ CVL 9/9 >> 9/24 picc 9/24>> Trach 9/20 >>  CULTURES: C diff 9/13 >> Antigen positive, toxin negative >> colonization Blood  9/22 >> negative Sputum 9/23 >> few C albicans Urine 9/23 >> multiple species  ASSESSMENT / PLAN:  PULMONARY Trach change to XLT 9/21>> A: Acute on chronic hypoxic respiratory failure - Pneumonia. Improving. Ventilator Dependent Respiratory Failure - s/p tracheostomy  H/O Asthma 10/1 >cont to talk around trach   P: Continue Vent Bundle Slowly weaning PEEP & FiO2 Continuing Xopenex nebs q6hr Monitor trach/cuff for leaks  Check cxr   CARDIOLOGY PICC 9/24>> A: A fib with RVR - CHA2DS2-VASc score 1. Acute Diastolic CHF - resolved 10/1 : remains in + bal  P: Decreased Diltiazem  via tube q6hr ASA  daily Monitor on tele Gentle diuresis with Lasix 20 mg IV 1 today Attempt to maintain even volume status  RENAL A: Acute on Chronic Renal Failure (CKD stage III) - Resolved Hypernatremia - resolved Hypokalemia - Resolved  P: Decrease free water q6hr Monitoring UOP Trending renal function daily with BUN/Creatinine Daily electrolyte panel  GASTROENTEROLOGY A: Diarrhea w/ flexiseal /CDiff colonization   P: Continuing Tube Feeds Pepcid via tube q12hr  HEMATOLOGY A: Anemia - improving. Secondary to cricital illness. No signs of active bleeding. Leukocytosis - unclear etiology. No fever yet.  P: Heparin [place] q8hr SCDs Monitor Hgb daily w/ CBC Trending daily leukocytosis with CBC  INFECTION A: Severe CAP - No organism identified. Treated with vancomycin & Merrem. Septic shock - resolved. C diff colonization  P: Plan to reculture for any fever Tr wbc and temp   ENDOCRINE A: Hyperglycemia - Likely steroid induced.  >should improve with steriods stopped on 9/30  P: Continuing SSI Accuchecks q4hr Continuing Lantus to 30 units daily at bedtime  NEUROLOGY A: Delirium - resolved Chronic Pain - on Methadone  P: Continue Lyrica  po q8hr (home dose) Methadone  po q6hr  Fentanyl IV prn  FAMILY UPDATE:  No family at bedside.  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 Seems to be improving clinicallyand ventilator requirement continues to wean .  Marland Kitchen Continuing to wean on pressure control ventilator support.  Monitor trach/cuff as remains able to speak around trach/vent. Bradycardia overnight will decrease cardizem dose. Gentle diuresis with lasix to keep neg/even bal and avoid vol overload. Decrease Free water freq.  Okay to wean on pressure support to TC as tolerated.  Adjust PEEP/FiO2 to keep SpO2 90 to 95%.  F/u CXR.   Updated pt's family at bedside.  CD Maple Hudson, MD [facility] Pulmonary/Critical Care 03/01/2015, 10:58 AM Pager:  [phone] After 3pm call: [phone]

## 2015-03-01 NOTE — Progress Notes (Signed)
Pt's alarm going off.  MV low.  Kept saying he could not breathe.  Took off vent and bagged pt.  Added air in cuff and seems to be better.  Mv now around 300-400

## 2015-03-01 NOTE — Progress Notes (Signed)
Pt having problems.  Called Dr. Sung Amabile  Worked with setting and cuff.  Deflated cuff some and placed on pressure support pt doing well

## 2015-03-01 NOTE — Progress Notes (Signed)
eLink Physician-Brief Progress Note Patient Name: Adam Benson DOB: 11-25-1960 MRN: [id number]   Date of Service  03/01/2015  HPI/Events of Note  Vent discomfort Apparent cuff leak  eICU Interventions  Vent changes made. Might need trach tube changed out 10/03 - for now will allow for cuff leak     Intervention Category Major Interventions: Respiratory failure - evaluation and management  Billy Fischer 03/01/2015, 5:25 PM

## 2015-03-01 NOTE — Progress Notes (Addendum)
Assumed care from off going Rn @ [phone]; patient alert & oriented on vent; AFIB on monitor; patient has c/o's pain 9 on pain scale 0-10 in hands; no signs of respiratory distress; feeding tube @ 75; NS @ KVO; bed in lowest position with call bell in reach; RT will continue to monitor patient  @1700  patient begin having some SOB problems; RT was in room; changed settings & cuff @ 1718; RT spoke with MD; patient alert and sitting high fowler's in bed ; no acute distress at this time

## 2015-03-01 NOTE — Progress Notes (Signed)
Pt transported to 2C on vent.  Pt tolerated well.  RT will continue to monitor.

## 2015-03-01 NOTE — Progress Notes (Signed)
Pt refuses suctioning, pt is stable at this time. 97% o2 saturations.

## 2015-03-02 ENCOUNTER — Encounter (HOSPITAL_COMMUNITY): Payer: Self-pay | Admitting: Certified Registered Nurse Anesthetist

## 2015-03-02 ENCOUNTER — Encounter (HOSPITAL_COMMUNITY)
Admission: AD | Disposition: A | Discharge status: Rehabilitation Facility | Payer: Self-pay | Source: Other Acute Inpatient Hospital | Attending: Pulmonary Disease

## 2015-03-02 ENCOUNTER — Inpatient Hospital Stay (HOSPITAL_COMMUNITY): Payer: Medicaid Other

## 2015-03-02 DIAGNOSIS — R059 Cough, unspecified: Secondary | ICD-10-CM | POA: Insufficient documentation

## 2015-03-02 DIAGNOSIS — J9621 Acute and chronic respiratory failure with hypoxia: Secondary | ICD-10-CM | POA: Insufficient documentation

## 2015-03-02 DIAGNOSIS — R05 Cough: Secondary | ICD-10-CM

## 2015-03-02 DIAGNOSIS — Z430 Encounter for attention to tracheostomy: Secondary | ICD-10-CM | POA: Insufficient documentation

## 2015-03-02 LAB — BASIC METABOLIC PANEL
ANION GAP: 5 (ref 5–15)
BUN: 28 mg/dL — ABNORMAL HIGH (ref 6–20)
CALCIUM: 8.3 mg/dL — AB (ref 8.9–10.3)
CHLORIDE: 102 mmol/L (ref 101–111)
CO2: 32 mmol/L (ref 22–32)
Creatinine, Ser: 0.73 mg/dL (ref 0.61–1.24)
GFR calc non Af Amer: >60 mL/min (ref >60)
Glucose, Bld: 103 mg/dL — ABNORMAL HIGH (ref 65–99)
Potassium: 4 mmol/L (ref 3.5–5.1)
Sodium: 139 mmol/L (ref 135–145)

## 2015-03-02 LAB — GLUCOSE, CAPILLARY
GLUCOSE-CAPILLARY: 87 mg/dL (ref 65–99)
GLUCOSE-CAPILLARY: 114 mg/dL — AB (ref 65–99)
GLUCOSE-CAPILLARY: 103 mg/dL — AB (ref 65–99)
Glucose-Capillary: 101 mg/dL — ABNORMAL HIGH (ref 65–99)
Glucose-Capillary: 102 mg/dL — ABNORMAL HIGH (ref 65–99)
Glucose-Capillary: 106 mg/dL — ABNORMAL HIGH (ref 65–99)
Glucose-Capillary: 86 mg/dL (ref 65–99)

## 2015-03-02 LAB — CBC
HEMATOCRIT: 34.9 % — AB (ref 39.0–52.0)
HEMOGLOBIN: 10.4 g/dL — AB (ref 13.0–17.0)
MCH: 22.6 pg — ABNORMAL LOW (ref 26.0–34.0)
MCHC: 29.8 g/dL — ABNORMAL LOW (ref 30.0–36.0)
MCV: 75.7 fL — ABNORMAL LOW (ref 78.0–100.0)
Platelets: 195 10*3/uL (ref 150–400)
RBC: 4.61 MIL/uL (ref 4.22–5.81)
RDW: 25.2 % — ABNORMAL HIGH (ref 11.5–15.5)
WBC: 13.5 10*3/uL — AB (ref 4.0–10.5)

## 2015-03-02 LAB — LEGIONELLA CULTURE

## 2015-03-02 LAB — APTT: aPTT: 27 seconds (ref 24–37)

## 2015-03-02 LAB — PROTIME-INR
INR: 1.12 (ref 0.00–1.49)
PROTHROMBIN TIME: 14.6 s (ref 11.6–15.2)

## 2015-03-02 SURGERY — CREATION, TRACHEOSTOMY
Anesthesia: General

## 2015-03-02 MED ORDER — MIDAZOLAM HCL 2 MG/2ML IJ SOLN
INTRAMUSCULAR | Status: AC
  Administered 2015-03-02: 2 mg
  Filled 2015-03-02: qty 2

## 2015-03-02 MED ORDER — FENTANYL CITRATE (PF) 100 MCG/2ML IJ SOLN
INTRAMUSCULAR | Status: AC
  Administered 2015-03-02: 50 ug
  Filled 2015-03-02: qty 2

## 2015-03-02 MED ORDER — HYDROCODONE-HOMATROPINE 5-1.5 MG/5ML PO SYRP
5.0000 mL | ORAL_SOLUTION | ORAL | Status: DC | PRN
  Administered 2015-03-03 – 2015-03-06 (×9): 5 mL
  Filled 2015-03-02 (×9): qty 5

## 2015-03-02 MED ORDER — FENTANYL CITRATE (PF) 250 MCG/5ML IJ SOLN
INTRAMUSCULAR | Status: AC
  Filled 2015-03-02: qty 5

## 2015-03-02 MED ORDER — MIDAZOLAM HCL 2 MG/2ML IJ SOLN
INTRAMUSCULAR | Status: AC
  Filled 2015-03-02: qty 4

## 2015-03-02 MED ORDER — GUAIFENESIN-DM 100-10 MG/5ML PO SYRP
5.0000 mL | ORAL_SOLUTION | ORAL | Status: DC | PRN
  Administered 2015-03-02: 5 mL via ORAL
  Filled 2015-03-02: qty 5

## 2015-03-02 MED ORDER — MIDAZOLAM HCL 2 MG/2ML IJ SOLN
INTRAMUSCULAR | Status: AC
  Administered 2015-03-02: 1 mg
  Filled 2015-03-02: qty 2

## 2015-03-02 MED ORDER — FENTANYL CITRATE (PF) 100 MCG/2ML IJ SOLN: 100.0000 ug | Freq: Once | INTRAMUSCULAR | Status: AC

## 2015-03-02 MED ORDER — FENTANYL CITRATE (PF) 100 MCG/2ML IJ SOLN
INTRAMUSCULAR | Status: AC
  Administered 2015-03-02: 100 ug
  Filled 2015-03-02: qty 2

## 2015-03-02 MED ORDER — LIDOCAINE HCL (PF) 1 % IJ SOLN
INTRAMUSCULAR | Status: AC
  Filled 2015-03-02: qty 5

## 2015-03-02 MED ORDER — FENTANYL CITRATE (PF) 100 MCG/2ML IJ SOLN
25.0000 ug | INTRAMUSCULAR | Status: DC | PRN
  Administered 2015-03-04 – 2015-03-05 (×3): 50 ug via INTRAVENOUS
  Administered 2015-03-06 (×3): 25 ug via INTRAVENOUS
  Administered 2015-03-06 – 2015-03-07 (×2): 50 ug via INTRAVENOUS
  Administered 2015-03-07: 25 ug via INTRAVENOUS
  Administered 2015-03-07 – 2015-03-09 (×15): 50 ug via INTRAVENOUS
  Filled 2015-03-02 (×25): qty 2

## 2015-03-02 MED ORDER — MIDAZOLAM HCL 2 MG/2ML IJ SOLN: 2.0000 mg | Freq: Once | INTRAMUSCULAR | Status: AC

## 2015-03-02 NOTE — Progress Notes (Signed)
eLink Physician-Brief Progress Note Patient Name: Adam Benson DOB: 1960-08-06 MRN: [id number]   Date of Service  03/02/2015  HPI/Events of Note  Concern for total trach leak despite suctioning, replacement of inner cannula  eICU Interventions  Dr Jamison Neighbor to bedside         [facility] 03/02/2015, 4:41 PM

## 2015-03-02 NOTE — Progress Notes (Signed)
°   03/02/15 1817  Clinical Encounter Type  Visited With Patient not available;Health care provider  Visit Type Code  Referral From Nurse   Responded to code page. Patient was having difficulty breathing. Patient was going to have surgery. Nurse sated his father was his next of kin. I contacted patient's father, Adam Benson. Nurse will call when father arrives, as he was not sure he would come back today.   Lorna Few, Chaplain 03/02/2015, 7:03 PM

## 2015-03-02 NOTE — Progress Notes (Signed)
Gave 50 mcgFentanyl and 1 mg of versed per Dr. Jamison Neighbor..Waisted remaining drugs and witnessed by Kristeen Miss, RN

## 2015-03-02 NOTE — Progress Notes (Signed)
ENT at bedside

## 2015-03-02 NOTE — Progress Notes (Signed)
Pulmonary at bedside with trach suturing.

## 2015-03-02 NOTE — Progress Notes (Signed)
Dr. Mickel Crow gave order to change trachea.

## 2015-03-02 NOTE — Progress Notes (Signed)
RT attempted to wean patient at 11:45 on CPAP 5 PS 5 through the vent prior to attempting trach collar. Pt failed wean due to increase in heart rate up to 144, RR 46, increased accessory muscle use. RT terminated the wean attempt and placed pt back on PS 20/ CPAP 5.  Patient is having coughing fits, and states he can't breath well. RT gave pt a PRN Xopenex treatment.  SPO2 98%, HR 147, RR 21.  RN and RN Case Manager notified.  RT will continue to monitor.

## 2015-03-02 NOTE — Progress Notes (Addendum)
Pt desat to 67%. Dark brown hemoptysis mucus plug had been coughed out of the trach by patient.  RT found on patient's chest. Taken off ATC and placed back on vent full support. Increased FIO2 to 100%.

## 2015-03-02 NOTE — Progress Notes (Signed)
Dr Jamison Neighbor in  change out trach and patient continues to desat  and a code was called.

## 2015-03-02 NOTE — Progress Notes (Signed)
Bleeding stopped; hold off on transfer until re-evaluation per Dr. Jamison Neighbor. On Vent at full support.

## 2015-03-02 NOTE — Progress Notes (Signed)
CSW spoke earlier today with Stacy at [facility]. She stated that at present she does not have a vent SNF bed available for patient and requested follow up later in the week.  She requests clarification re: father's ability to help with applying for long term care Medicaid should [facility] accept patient. Will need to discuss with patient's father.  Lorri Frederick. Jaci Lazier, [place] [phone] (coverage)

## 2015-03-02 NOTE — Progress Notes (Signed)
PULMONARY / CRITICAL CARE MEDICINE   Name: Adam Benson MRN: [id number] DOB: February 08, 1961    ADMISSION DATE:  02/01/2015 CONSULTATION DATE:  02/01/2015  REFERRING MD :  [facility]   CHIEF COMPLAINT:  Severe CAP  INITIAL PRESENTATION: 54 yo male from [place] with dyspnea, wt gain, edema.  Developed VDRF from PNA and ARDS, a fib with RVR.  STUDIES:  9/04 Echo > EF 50 to 55%  SIGNIFICANT EVENTS: 9/04 Intubated, transfer to [facility] 9/09 FOB, Fever 103 9/16 Repeat FOB 9/20 trach placed, fib rvr 9/21 leak, dislodged trach, changed to xtra long successful 9/24 moved to ICU and placed on PCV 10/2- to stepdown 10/3 - continues to talk around cuff, anxious to get trach out.  SUBJECTIVE: Resting comfortably in bed visiting with family.  Remains on vent but continues to talk around cuff.  He is anxious to get trach out as soon as possible.  Vitals stable.  Denies difficulty breathing. No secretions.  VITAL SIGNS: Temp:  [97.5 F (36.4 C)-98.3 F (36.8 C)] 97.5 F (36.4 C) (10/03 0833) Pulse Rate:  [73-113] 97 (10/03 0833) Resp:  [14-28] 20 (10/03 0833) BP: (94-114)/(59-73) 98/62 mmHg (10/03 0833) SpO2:  [95 %-100 %] 97 % (10/03 0833) FiO2 (%):  [40 %] 40 % (10/03 0833) Weight:  [133.9 kg (295 lb 3.1 oz)] 133.9 kg (295 lb 3.1 oz) (10/03 0434)   VENTILATOR SETTINGS: Vent Mode:  [-] PSV;CPAP FiO2 (%):  [40 %] 40 % Set Rate:  [26 bmp] 26 bmp PEEP:  [5 cmH20-8 cmH20] 5 cmH20 Pressure Support:  [20 cmH20] 20 cmH20 Plateau Pressure:  [17 cmH20-28 cmH20] 17 cmH20   INTAKE / OUTPUT:  Intake/Output Summary (Last 24 hours) at 03/02/15 1035 Last data filed at 03/02/15 0840  Gross per 24 hour  Intake   2450 ml  Output   3300 ml  Net   -850 ml    PHYSICAL EXAMINATION: General:  In bed on vent, resting comfortably visiting with family. Integument:  Warm & dry. No rash on exposed skin.  HEENT:  Trach in place. No scleral icterus. NG tube in place. Speaks around cuff/ minimal  leak Cardiovascular:  Regular rate. No appreciated murmurs, rubs, or gallops. No edema. No appreciable JVD given body habitus. Pulmonary:   Symmetric chest wall rise. Coarse breath sounds bilaterally. Abdomen: Soft. Normal bowel sounds. Nontender. Protuberant. Neurological:   No meningismus. Moving all 4 extremities equally.  Follows commands and alert   CMP Latest Ref Rng 03/01/2015 02/28/2015 02/27/2015  Glucose 65 - 99 mg/dL 143(O) 887(N) 797(K)  BUN 6 - 20 mg/dL 82(S) 60(R) 56(F)  Creatinine 0.61 - 1.24 mg/dL 5.37 9.43 2.76  Sodium 135 - 145 mmol/L 135 136 138  Potassium 3.5 - 5.1 mmol/L 4.0 4.5 4.4  Chloride 101 - 111 mmol/L 96(L) 96(L) 95(L)  CO2 22 - 32 mmol/L 33(H) 32 34(H)  Calcium 8.9 - 10.3 mg/dL 7.9(L) 8.2(L) 8.3(L)  Total Protein 6.5 - 8.1 g/dL - - -  Total Bilirubin 0.3 - 1.2 mg/dL - - -  Alkaline Phos 38 - 126 U/L - - -  AST 15 - 41 U/L - - -  ALT 17 - 63 U/L - - -    CBC Latest Ref Rng 03/01/2015 02/28/2015 02/27/2015  WBC 4.0 - 10.5 K/uL 15.1(H) 18.4(H) 13.9(H)  Hemoglobin 13.0 - 17.0 g/dL 10.6(L) 10.9(L) 11.2(L)  Hematocrit 39.0 - 52.0 % 35.5(L) 35.8(L) 38.1(L)  Platelets 150 - 400 K/uL 220 280 274    CBG (last  3)   Recent Labs  03/02/15 0030 03/02/15 0431 03/02/15 0801  GLUCAP 106* 86 87   ABG    Component Value Date/Time   PHART 7.466* 02/23/2015 0442   PCO2ART 55.6* 02/23/2015 0442   PO2ART 67.6* 02/23/2015 0442   HCO3 39.5* 02/23/2015 0442   TCO2 41.2 02/23/2015 0442   O2SAT 92.5 02/23/2015 0442     Dg Chest Port 1 View  03/01/2015   CLINICAL DATA:  Pneumonia  EXAM: PORTABLE CHEST 1 VIEW  COMPARISON:  02/23/2015  FINDINGS: Tracheostomy in good position. Right arm PICC tip in the SVC unchanged. Feeding tube in the stomach.  Mild improvement in diffuse bilateral airspace disease, basilar predominant. No effusion.  IMPRESSION: Mild improvement in bilateral airspace disease.   Electronically Signed   By: Marlan Palau M.D.   On: 03/01/2015 07:11     LINES/TUBES: ETT 9/4 >> 9/20 R IJ CVL 9/4 >> 9/9 L IJ CVL 9/9 >> 9/24 picc 9/24>> Trach 9/20 >>  CULTURES: C diff 9/13 >> Antigen positive, toxin negative >> colonization Blood 9/22 >> negative Sputum 9/23 >> few C albicans Urine 9/23 >> multiple species  ASSESSMENT / PLAN:  PULMONARY Trach change to XLT 9/21>> A: Acute on chronic hypoxic respiratory failure - Pneumonia. Improving. Ventilator Dependent Respiratory Failure - s/p tracheostomy.  Tolerating PCV well. H/O Asthma. 10/3 > continues to talk around trach. P: Continue vent bundle. Continue to wean with goal to ATC. Continuing Xopenex nebs q6hr. Monitor trach/cuff for leaks.  Check CXR in AM.  CARDIOLOGY PICC 9/24>> A: A fib with RVR - CHA2DS2-VASc score 1. Acute Diastolic CHF - resolved. P: Decreased Diltiazem  via tube q6hr. ASA  daily. Monitor on tele. Attempt to maintain even volume status .  RENAL A: Acute on Chronic Renal Failure (CKD stage III) - Resolved. Hypernatremia - resolved. Hypokalemia - Resolved. P: Decrease free water q6hr. Monitoring UOP. Check BMP now (none today) and in AM.  GASTROENTEROLOGY A: Diarrhea w/ flexiseal /CDiff colonization. P: Continuing Tube Feeds. Pepcid via tube q12hr.  HEMATOLOGY A: Anemia - improving. Secondary to cricital illness. No signs of active bleeding. VTE prophylaxis. P: Transfuse per usual guidelines. Heparin [place] q8hr / SCDs. CBC in AM.  INFECTION A: Severe CAP - No organism identified; s/p treatment with vancomycin & Merrem. Septic shock - resolved. C diff colonization. P: Plan to reculture for any fever. Tr wbc and temp.   ENDOCRINE A: Hyperglycemia - steroid induced; improving since steroids d/c'd. P: Continuing SSI. Accuchecks q4hr. Continuing Lantus to 30 units daily at bedtime.  NEUROLOGY A: Delirium - resolved. Chronic Pain - on Methadone. P: Continue Lyrica  po q8hr (home dose) Methadone  po  q6hr. Fentanyl IV prn.   FAMILY UPDATE:  Family updated at bedside.  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 Seems to be improving clinically and ventilator requirement continues to wean. Continuing to wean on pressure control ventilator support.  Monitor trach/cuff as remains able to speak around trach/vent.  Diurese as needed to maintain even balance.  Okay to wean to TC as tolerated.  Adjust PEEP/FiO2 to keep SpO2 90 to 95%.  F/u CXR in AM.   Rutherford Guys, PA - C  Pulmonary & Critical Care Medicine Pager: [phone]  or [phone] 03/02/2015, 10:45 AM

## 2015-03-02 NOTE — Progress Notes (Addendum)
   D/w Dr Jamison Neighbor from bedside   - New trach changed 6XLT and then with emergent ENT helped packed with thrombin due to granulation tissue bleed. Cuff should not be deflated tonight  - if bleeds again - Dr Suszanne Conners to be called agai for trip to OR   Dr. Kalman Shan, M.D., [facility].C.P Pulmonary and Critical Care Medicine Staff Physician [facility] [place] [facility] Pager: [phone], If no answer or between  15:00h - 7:00h: call [phone]  03/02/2015 7:37 PM

## 2015-03-02 NOTE — Progress Notes (Signed)
Continues to bleed from trach and OR MD suggest not to remove trach at this time. ENT MD Called.

## 2015-03-02 NOTE — Progress Notes (Signed)
In room pt coughing; desatting to mid 60's.  Resp Therapy in to see. Color cyanotic. E link called and Dr. Princella Pellegrini orders for 100mg  of Fentanyl in divided doses and versed  2mg . Pt coughed up 1/2 dollar sized bloody mucous plug. Suctioned by resp. Therapy, inner cannula changed and breathing treatment given.

## 2015-03-02 NOTE — Progress Notes (Signed)
Increased peep due to decreased SPO2. Pt bag lavaged with 10cc saline through trach. No secretions removed at this time. SPO2 increased to 95%.

## 2015-03-02 NOTE — Consult Note (Signed)
Reason for Consult: Hemorrhage from trach stoma  HPI:  Adam Benson is an 54 y.o. male who was originally transferred from [place] with dyspnea, weight gain and LE edema. He failed BiPap, was intubated 9/4 and transferred to [facility]. He was found to have CAP with ARDS. ICU course has been complicated by a-fib with RVR and bradycardia. A tracheostomy tube was subsequently placed by the critical care team. The trach was dislodged earlier today. The 6XLT trach was replaced without difficulty. However, the patient developed bleeding from his tracheostomy site soon after. ENT consulted for further evaluation and treatment.  Past Medical History  Diagnosis Date  . Gout   . HTN (hypertension), benign   . Chronic atrial fibrillation   . Hyperlipemia   . Nerve damage     right arm  . Obesity   . Insomnia   . GERD (gastroesophageal reflux disease)   . History of pneumonia 05/2014     two month hosp stay, vent, trach   . 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 Peptic ulcer 04/2014    Past Surgical History  Procedure Laterality Date  . Arm surgery      right  . Appendectomy    . Knee surgery      right    Family History  Problem Relation Age of Onset  . Heart disease Mother   . Diabetes Mother   . Hypertension Mother     Social History:  reports that he has never smoked. His smokeless tobacco use includes Chew. He reports that he does not drink alcohol. His drug history is not on file.  Allergies:  Allergies  Allergen Reactions  . Penicillins Hives and Shortness Of Breath  . Hydrochlorothiazide Other (See Comments)    hypercalemia    Prior to Admission medications   Medication Sig Start Date End Date Taking? Authorizing Provider  colchicine 0.6 MG tablet Take 0.6 mg by mouth 2 (two) times daily.   Yes Historical Provider, MD  diltiazem (CARDIZEM CD) 240 MG 24 hr capsule Take 240 mg by mouth daily.   Yes Historical Provider, MD   Febuxostat 80 MG TABS Take 80 mg by mouth daily.   Yes Historical Provider, MD  fenofibrate (TRICOR) 145 MG tablet Take 145 mg by mouth daily.   Yes Historical Provider, MD  furosemide (LASIX) 20 MG tablet Take 20 mg by mouth daily.   Yes Historical Provider, MD  metoprolol tartrate (LOPRESSOR) 25 MG tablet Take 25 mg by mouth 2 (two) times daily.   Yes Historical Provider, MD  omeprazole (PRILOSEC) 20 MG capsule Take 20 mg by mouth 2 (two) times daily before a meal.   Yes Historical Provider, MD  pregabalin (LYRICA) 200 MG capsule Take 200 mg by mouth. Take 1 capsule three times a day.   Yes Historical Provider, MD    Medications:  I have reviewed the patient's current medications. Scheduled: . antiseptic oral rinse  7 mL Mouth Rinse QID  . chlorhexidine gluconate  15 mL Mouth Rinse BID  . diltiazem  30 mg Per Tube 4 times per day  . famotidine  20 mg Per Tube Q12H  . feeding supplement (PRO-STAT SUGAR FREE 64)  30 mL Per Tube TID  . free water  400 mL Per Tube Q6H  . insulin aspart  0-9 Units Subcutaneous 6 times per day  . insulin glargine  30 Units Subcutaneous QHS  . lidocaine (PF)      .  methadone  5 mg Oral 3 times per day  . pregabalin  200 mg Per Tube 3 times per day  . sodium chloride  10-40 mL Intracatheter Q12H   ZOX:WRUEAV chloride, acetaminophen (TYLENOL) oral liquid 160 mg/5 mL, fentaNYL (SUBLIMAZE) injection, HYDROcodone-homatropine, levalbuterol, sodium chloride  Results for orders placed or performed during the hospital encounter of 02/01/15 (from the past 48 hour(s))  Glucose, capillary     Status: Abnormal   Collection Time: 02/28/15 11:47 PM  Result Value Ref Range   Glucose-Capillary 112 (H) 65 - 99 mg/dL   Comment 1 Notify RN   Glucose, capillary     Status: Abnormal   Collection Time: 03/01/15  3:41 AM  Result Value Ref Range   Glucose-Capillary 115 (H) 65 - 99 mg/dL  CBC     Status: Abnormal   Collection Time: 03/01/15  5:24 AM  Result Value Ref Range    WBC 15.1 (H) 4.0 - 10.5 K/uL   RBC 4.74 4.22 - 5.81 MIL/uL   Hemoglobin 10.6 (L) 13.0 - 17.0 g/dL   HCT 35.5 (L) 39.0 - 52.0 %   MCV 74.9 (L) 78.0 - 100.0 fL   MCH 22.4 (L) 26.0 - 34.0 pg   MCHC 29.9 (L) 30.0 - 36.0 g/dL   RDW 25.1 (H) 11.5 - 15.5 %   Platelets 220 150 - 400 K/uL    Comment: SPECIMEN CHECKED FOR CLOTS REPEATED TO VERIFY CONSISTENT WITH PREVIOUS RESULT   Basic metabolic panel     Status: Abnormal   Collection Time: 03/01/15  5:24 AM  Result Value Ref Range   Sodium 135 135 - 145 mmol/L   Potassium 4.0 3.5 - 5.1 mmol/L   Chloride 96 (L) 101 - 111 mmol/L   CO2 33 (H) 22 - 32 mmol/L   Glucose, Bld 104 (H) 65 - 99 mg/dL   BUN 36 (H) 6 - 20 mg/dL   Creatinine, Ser 0.77 0.61 - 1.24 mg/dL   Calcium 7.9 (L) 8.9 - 10.3 mg/dL   GFR calc non Af Amer >60 >60 mL/min   GFR calc Af Amer >60 >60 mL/min    Comment: (NOTE) The eGFR has been calculated using the CKD EPI equation. This calculation has not been validated in all clinical situations. eGFR's persistently <60 mL/min signify possible Chronic Kidney Disease.    Anion gap 6 5 - 15  Magnesium     Status: None   Collection Time: 03/01/15  5:24 AM  Result Value Ref Range   Magnesium 2.1 1.7 - 2.4 mg/dL  Glucose, capillary     Status: None   Collection Time: 03/01/15  8:02 AM  Result Value Ref Range   Glucose-Capillary 94 65 - 99 mg/dL  Glucose, capillary     Status: Abnormal   Collection Time: 03/01/15 12:37 PM  Result Value Ref Range   Glucose-Capillary 103 (H) 65 - 99 mg/dL  Glucose, capillary     Status: None   Collection Time: 03/01/15  4:06 PM  Result Value Ref Range   Glucose-Capillary 82 65 - 99 mg/dL  Glucose, capillary     Status: Abnormal   Collection Time: 03/01/15  8:54 PM  Result Value Ref Range   Glucose-Capillary 101 (H) 65 - 99 mg/dL  Glucose, capillary     Status: Abnormal   Collection Time: 03/02/15 12:30 AM  Result Value Ref Range   Glucose-Capillary 106 (H) 65 - 99 mg/dL  Glucose,  capillary     Status: None   Collection  Time: 03/02/15  4:31 AM  Result Value Ref Range   Glucose-Capillary 86 65 - 99 mg/dL   Comment 1 Notify RN    Comment 2 Document in Chart   Glucose, capillary     Status: None   Collection Time: 03/02/15  8:01 AM  Result Value Ref Range   Glucose-Capillary 87 65 - 99 mg/dL  Glucose, capillary     Status: Abnormal   Collection Time: 03/02/15 12:35 PM  Result Value Ref Range   Glucose-Capillary 114 (H) 65 - 99 mg/dL  CBC     Status: Abnormal   Collection Time: 03/02/15  2:00 PM  Result Value Ref Range   WBC 13.5 (H) 4.0 - 10.5 K/uL   RBC 4.61 4.22 - 5.81 MIL/uL   Hemoglobin 10.4 (L) 13.0 - 17.0 g/dL   HCT 34.9 (L) 39.0 - 52.0 %   MCV 75.7 (L) 78.0 - 100.0 fL   MCH 22.6 (L) 26.0 - 34.0 pg   MCHC 29.8 (L) 30.0 - 36.0 g/dL   RDW 25.2 (H) 11.5 - 15.5 %   Platelets 195 150 - 400 K/uL  Basic metabolic panel     Status: Abnormal   Collection Time: 03/02/15  2:00 PM  Result Value Ref Range   Sodium 139 135 - 145 mmol/L   Potassium 4.0 3.5 - 5.1 mmol/L   Chloride 102 101 - 111 mmol/L   CO2 32 22 - 32 mmol/L   Glucose, Bld 103 (H) 65 - 99 mg/dL   BUN 28 (H) 6 - 20 mg/dL   Creatinine, Ser 0.73 0.61 - 1.24 mg/dL   Calcium 8.3 (L) 8.9 - 10.3 mg/dL   GFR calc non Af Amer >60 >60 mL/min   GFR calc Af Amer >60 >60 mL/min    Comment: (NOTE) The eGFR has been calculated using the CKD EPI equation. This calculation has not been validated in all clinical situations. eGFR's persistently <60 mL/min signify possible Chronic Kidney Disease.    Anion gap 5 5 - 15  Glucose, capillary     Status: Abnormal   Collection Time: 03/02/15  5:01 PM  Result Value Ref Range   Glucose-Capillary 103 (H) 65 - 99 mg/dL    Dg Chest Port 1 View  03/02/2015   CLINICAL DATA:  Respiratory distress, shortness of breath  EXAM: PORTABLE CHEST 1 VIEW  COMPARISON:  Chest radiograph from one day prior.  FINDINGS: Weighted enteric tube tip is in the proximal stomach. Right PICC  terminates at the cavoatrial junction. Stable cardiomediastinal silhouette with mild cardiomegaly. No pneumothorax. No pleural effusion. There are diffuse fluffy airspace opacities throughout both lungs, asymmetrically prominent on the left, significantly worsened in the interval.  IMPRESSION: Stable mild cardiomegaly. Significant interval worsening of diffuse fluffy airspace opacities throughout both lungs, asymmetrically prominent on the left, favor worsening severe pulmonary edema, differential includes worsening multifocal pneumonia.   Electronically Signed   By: Ilona Sorrel M.D.   On: 03/02/2015 16:56   Dg Chest Port 1 View  03/01/2015   CLINICAL DATA:  Pneumonia  EXAM: PORTABLE CHEST 1 VIEW  COMPARISON:  02/23/2015  FINDINGS: Tracheostomy in good position. Right arm PICC tip in the SVC unchanged. Feeding tube in the stomach.  Mild improvement in diffuse bilateral airspace disease, basilar predominant. No effusion.  IMPRESSION: Mild improvement in bilateral airspace disease.   Electronically Signed   By: Franchot Gallo M.D.   On: 03/01/2015 07:11   Blood pressure 86/56, pulse 98, temperature 98.1 F (36.7  C), temperature source Axillary, resp. rate 22, height 6' (1.829 m), weight 133.9 kg (295 lb 3.1 oz), SpO2 100 %.   Physical Exam: General:In bed on vent. Alert and follows commands. Integument: Warm & dry. No rash on exposed skin.  Eyes: PERRL, EOMI. Ears: Normal auricles and EACs. Nose: Normal mucosa, septum, and turbinates Mouth: Normal mucosa. No lesion. Neck: Trach in place. A small amount of blood around the trach stoma. Cardiovascular: Regular rate. NSR. Pulmonary: Symmetric chest wall rise. Coarse breath sounds bilaterally. Abdomen: Soft. Normal bowel sounds. Nontender.  Neurological: No meningismus. Moving all 4 extremities equally. Follows commands and alert   Procedure: Exploration for postoperative tracheostomy hemorrhage Anesthesia: IV fentanyl Description: The  patient is placed supine on his hospital bed. A flexible scope is inserted via the trach tube into the trachea. No bleeding is noted within the trachea and the mainstem bronchi. The scope is withdrawn. Examination of the trach stoma showed some bleeding granulation tissue. The stoma is then packed with surgicel snow. No desaturation noted during the exam.  Assessment/Plan: Hemorrhage from trach stoma, likely from the granulation tissue. The trach stoma is packed with surgicel snow. No active bleeding noted on flexible scope. Will leave the trach cuff inflated for tonight. May wean vent support tomorrow.  Alphonsa Brickle,SUI W 03/02/2015, 9:21 PM

## 2015-03-02 NOTE — Progress Notes (Signed)
Resp Therapy continues to be at bedside. Dr. Mickel Crow called again from [facility].

## 2015-03-02 NOTE — Progress Notes (Signed)
PCCM Attending Note:  I was notified by Dr. [name] that the patient had a coughing spell producing a thick, bloody mucus. This occurred earlier this evening dislodging the patient's inner cannula. [name] inner cannula was then replaced by respiratory therapy. I traveled from [facility] to [facility] to assess the patient immediately. On my arrival in the room the patient was in no distress. Sitting comfortably with audible phonation despite a partially inflated cuff and the tracheostomy dislodge projecting at least halfway out of his stoma. The tracheostomy cuff was then completely deflated and the tracheostomy was removed. A Shiley #6 XLT was replaced by myself at bedside with the ease. There is no bleeding immediately before or after replacing via tracheostomy. His old tracheostomy had no bloody secretions that were visible. I attempted to perform an airway inspection with a bedside bronchoscope that was not fiberoptic. However, due to the poor quality of the instrument I could not see any discernable images. Approximately 3 minutes after the tracheostomy was placed  with the balloon inflated now the patient began to expectorate bright red blood from his tracheostomy. The  patient began desaturating and I immediately  instructed the nurse to call a CODE BLUE. At no point did the patient become hypotensive, lose a pulse, or lose consciousness during this entire process. Anesthesia came to bedside immediately. After further discussion the anesthesiologist did not feel that performing endotracheal intubation was necessary or recommended at this time given the patient did have an airway in place. His airway underwent repeated lavage and suctioning of bloody secretions with return of saturations through bag mask ventilation and ultimately the tracheostomy was reattached to the ventilator circuit with improved oxygenation. Anesthesia contacted ENT who immediately came to bedside to assess the  patient.  With the tracheostomy cuff now inflated we began weaning the patient's oxygen requirement. The bleeding had spontaneously stopped. ENT performed a bedside fiberoptic airway evaluation which reportedly showed no active bleeding below the site of the tracheostomy. ENT then proceeded to apply thrombin coagulant to the tracheostomy stoma believing that granulation tissue was a source for the patient's bleeding with manipulation from removal of prior tracheostomy & replacement with new tracheostomy. Hemostasis persisted for over 30 minutes and patient was given a one-time dose of Versed & fentanyl as well as 1% lidocaine subcutaneously while his tracheostomy was sutured into place by Rutherford Guys, PA. I communicated with Dr. [name] to make him aware of the events that transpired. Joneen Roach, NP, will reassess the patient approximately one hour from this event to ensure that he continues to remain stable and is clinically improving before transferring the patient to the intensive care unit for further monitoring if necessary.   Donna Christen Jamison Neighbor, M.D. [facility] Pager:  [phone] After 3pm or if no response, call [phone]

## 2015-03-03 ENCOUNTER — Inpatient Hospital Stay (HOSPITAL_COMMUNITY): Payer: Medicaid Other

## 2015-03-03 DIAGNOSIS — J189 Pneumonia, unspecified organism: Secondary | ICD-10-CM | POA: Insufficient documentation

## 2015-03-03 DIAGNOSIS — J849 Interstitial pulmonary disease, unspecified: Secondary | ICD-10-CM | POA: Insufficient documentation

## 2015-03-03 LAB — GLUCOSE, CAPILLARY
Glucose-Capillary: 88 mg/dL (ref 65–99)
Glucose-Capillary: 95 mg/dL (ref 65–99)
Glucose-Capillary: 93 mg/dL (ref 65–99)

## 2015-03-03 LAB — CBC
HCT: 34.8 % — ABNORMAL LOW (ref 39.0–52.0)
Hemoglobin: 10.1 g/dL — ABNORMAL LOW (ref 13.0–17.0)
MCH: 22.4 pg — ABNORMAL LOW (ref 26.0–34.0)
MCHC: 29 g/dL — ABNORMAL LOW (ref 30.0–36.0)
MCV: 77.2 fL — ABNORMAL LOW (ref 78.0–100.0)
PLATELETS: 197 10*3/uL (ref 150–400)
RBC: 4.51 MIL/uL (ref 4.22–5.81)
RDW: 25.4 % — ABNORMAL HIGH (ref 11.5–15.5)
WBC: 12.9 10*3/uL — AB (ref 4.0–10.5)

## 2015-03-03 LAB — MAGNESIUM: MAGNESIUM: 2.1 mg/dL (ref 1.7–2.4)

## 2015-03-03 LAB — BASIC METABOLIC PANEL
Anion gap: 8 (ref 5–15)
BUN: 29 mg/dL — ABNORMAL HIGH (ref 6–20)
CO2: 29 mmol/L (ref 22–32)
Calcium: 8.1 mg/dL — ABNORMAL LOW (ref 8.9–10.3)
Chloride: 100 mmol/L — ABNORMAL LOW (ref 101–111)
Creatinine, Ser: 0.75 mg/dL (ref 0.61–1.24)
GFR calc Af Amer: >60 mL/min (ref >60)
Glucose, Bld: 98 mg/dL (ref 65–99)
POTASSIUM: 4 mmol/L (ref 3.5–5.1)
SODIUM: 137 mmol/L (ref 135–145)

## 2015-03-03 LAB — PHOSPHORUS: PHOSPHORUS: 4.6 mg/dL (ref 2.5–4.6)

## 2015-03-03 MED ORDER — MIDAZOLAM HCL 2 MG/2ML IJ SOLN
INTRAMUSCULAR | Status: AC
  Administered 2015-03-03: 2 mg
  Filled 2015-03-03: qty 2

## 2015-03-03 MED ORDER — FUROSEMIDE 10 MG/ML IJ SOLN
40.0000 mg | Freq: Two times a day (BID) | INTRAMUSCULAR | Status: DC
  Administered 2015-03-03 – 2015-03-04 (×2): 40 mg via INTRAVENOUS
  Filled 2015-03-03 (×5): qty 4

## 2015-03-03 MED ORDER — MORPHINE SULFATE (PF) 2 MG/ML IV SOLN
2.0000 mg | INTRAVENOUS | Status: AC | PRN
Start: 2015-03-03 — End: 2015-03-03
  Administered 2015-03-03: 2 mg via INTRAVENOUS
  Administered 2015-03-03: 4 mg via INTRAVENOUS
  Filled 2015-03-03: qty 2
  Filled 2015-03-03: qty 1

## 2015-03-03 MED ORDER — MORPHINE SULFATE (PF) 2 MG/ML IV SOLN
INTRAVENOUS | Status: AC
  Administered 2015-03-03: 2 mg via INTRAVENOUS
  Filled 2015-03-03: qty 1

## 2015-03-03 MED ORDER — SODIUM CHLORIDE 0.9 % IV SOLN
50.0000 ug/h | INTRAVENOUS | Status: DC
  Administered 2015-03-03: 50 ug/h via INTRAVENOUS
  Filled 2015-03-03: qty 50

## 2015-03-03 NOTE — Progress Notes (Addendum)
PCCM Attending Note: Patient had repeat episode of hemoptysis with his cough fully inflated. I was called immediately the bedside. IV morphine & Versed were given for relaxation and cough suppression. Minimal suctioning was used to remove bloody secretions and circuit was changed. Patient now oxygenating and comfortable. ENT contacted by Dr. Vassie Loll. Starting fentanyl infusion for cough suppression. Transferring patient to the intensive care unit for close monitoring. Further management per ENT. Patient was awake during the entire episode and had only transient episodes of hypoxemia. He was never hypotensive nor did he lose blood pressure or consciousness. Father updated via phone regarding transfer.  Donna Christen Jamison Neighbor, M.D. [facility] Pager:  [phone] After 3pm or if no response, call [phone]

## 2015-03-03 NOTE — Progress Notes (Signed)
SLP Cancellation Note  Patient Details Name: Adam Benson MRN: [id number] DOB: [birth year]   Cancelled treatment:       Reason Eval/Treat Not Completed: Medical issues which prohibited therapy. Will hold therapy today given events of last night.   Harlon Ditty, MA CCC-SLP [phone]  Claudine Mouton 03/03/2015, 10:14 AM

## 2015-03-03 NOTE — Progress Notes (Signed)
Patient had a hemoptysis coughing spell post tracheal inline suction and.  Patient became in distress and stated he couldn't breathe. Patient was bagged and lavaged and minimal suction performed to clear airway.  Patient continues to have a strong cough producing hemoptysis.  Patient also has thick pale yellow secretions oozing from trach site.  Patient given versed, relaxed, coughing ceased, no return of bloody secretions at this time. Patient placed back on vent at this time.

## 2015-03-03 NOTE — Progress Notes (Signed)
PT Cancellation Note  Patient Details Name: Brodi Toren MRN: [id number] DOB: [birth year]   Cancelled Treatment:    Reason Eval/Treat Not Completed: Medical issues which prohibited therapy. Pt with episode of bleeding last night and transferred back to ICU on vent. Will hold PT at this time and check back for medical readiness to participate at next available date.    Conni Slipper 03/03/2015, 10:27 AM   Conni Slipper, PT, DPT Acute Rehabilitation Services Pager: [phone]

## 2015-03-03 NOTE — Progress Notes (Signed)
Came in to see pt for apparent bleeding. Bleeding was minor and spontaneously resolved. Tracheoscopy performed at bedside shows no blood in trachea or mainstem bronchi. No intervention needed from ENT perspective. Expect some minor bleeding over the next few days due to trauma of trach re-insertion yesterday. Fresh trach care.

## 2015-03-03 NOTE — Progress Notes (Addendum)
PULMONARY / CRITICAL CARE MEDICINE   Name: Adam Benson MRN: [id number] DOB: 14-Sep-1960    ADMISSION DATE:  02/01/2015 CONSULTATION DATE:  02/01/2015  REFERRING MD :  [facility]   CHIEF COMPLAINT:  Severe CAP  INITIAL PRESENTATION: 54 yo male from [place] with dyspnea, wt gain, edema.  Developed VDRF from PNA and ARDS, a fib with RVR.  STUDIES:  9/04 Echo > EF 50 to 55%  SIGNIFICANT EVENTS: 9/04 Intubated, transfer to [facility] 9/09 FOB, Fever 103 9/16 Repeat FOB 9/20 trach placed, fib rvr 9/21 leak, dislodged trach, changed to xtra long successful 9/24 moved to ICU and placed on PCV 10/2- to stepdown 10/3 - continues to talk around cuff, anxious to get trach out. 10/3- loss of volumes = trach change, bleeding  SUBJECTIVE: No bleeding this am .  VITAL SIGNS: Temp:  [98.1 F (36.7 C)-98.8 F (37.1 C)] 98.8 F (37.1 C) (10/04 1249) Pulse Rate:  [47-110] 86 (10/04 1300) Resp:  [15-23] 16 (10/04 1300) BP: (86-124)/(53-87) 90/76 mmHg (10/04 1300) SpO2:  [90 %-100 %] 95 % (10/04 1300) FiO2 (%):  [40 %-100 %] 60 % (10/04 1200) Weight:  [128.7 kg (283 lb 11.7 oz)] 128.7 kg (283 lb 11.7 oz) (10/04 0500)   VENTILATOR SETTINGS: Vent Mode:  [-] PCV FiO2 (%):  [40 %-100 %] 60 % Set Rate:  [16 bmp] 16 bmp PEEP:  [5 cmH20-8 cmH20] 8 cmH20 Plateau Pressure:  [16 cmH20] 16 cmH20   INTAKE / OUTPUT:  Intake/Output Summary (Last 24 hours) at 03/03/15 1355 Last data filed at 03/03/15 1200  Gross per 24 hour  Intake 2680.08 ml  Output   2600 ml  Net  80.08 ml    PHYSICAL EXAMINATION: General:  In bed on vent, resting comfortably Integument:  Warm & dry. No rash on exposed skin.  HEENT:  Trach in place. Mild discharge top wound, no bleed Cardiovascular:  Regular rate s1 s2 . Pulmonary:   Anterior distant but clear Abdomen: Soft. Normal bowel sounds. Nontender. Protuberant. Neurological:   No meningismus. Moving all 4 extremities equally.  Follows commands and alert   CMP  Latest Ref Rng 03/03/2015 03/02/2015 03/01/2015  Glucose 65 - 99 mg/dL 98 829(F) 621(H)  BUN 6 - 20 mg/dL 08(M) 57(Q) 46(N)  Creatinine 0.61 - 1.24 mg/dL 6.29 5.28 4.13  Sodium 135 - 145 mmol/L 137 139 135  Potassium 3.5 - 5.1 mmol/L 4.0 4.0 4.0  Chloride 101 - 111 mmol/L 100(L) 102 96(L)  CO2 22 - 32 mmol/L 29 32 33(H)  Calcium 8.9 - 10.3 mg/dL 8.1(L) 8.3(L) 7.9(L)  Total Protein 6.5 - 8.1 g/dL - - -  Total Bilirubin 0.3 - 1.2 mg/dL - - -  Alkaline Phos 38 - 126 U/L - - -  AST 15 - 41 U/L - - -  ALT 17 - 63 U/L - - -    CBC Latest Ref Rng 03/03/2015 03/02/2015 03/01/2015  WBC 4.0 - 10.5 K/uL 12.9(H) 13.5(H) 15.1(H)  Hemoglobin 13.0 - 17.0 g/dL 10.1(L) 10.4(L) 10.6(L)  Hematocrit 39.0 - 52.0 % 34.8(L) 34.9(L) 35.5(L)  Platelets 150 - 400 K/uL 197 195 220    CBG (last 3)   Recent Labs  03/03/15 0011 03/03/15 0434 03/03/15 1249  GLUCAP 88 95 93   ABG    Component Value Date/Time   PHART 7.466* 02/23/2015 0442   PCO2ART 55.6* 02/23/2015 0442   PO2ART 67.6* 02/23/2015 0442   HCO3 39.5* 02/23/2015 0442   TCO2 41.2 02/23/2015 0442  O2SAT 92.5 02/23/2015 0442     Dg Chest Port 1 View  03/03/2015   CLINICAL DATA:  Cough and shortness breath, acute respiratory failure, CHF, ARDS.  EXAM: PORTABLE CHEST 1 VIEW  COMPARISON:  Portable chest x-ray of July 31, 2014  FINDINGS: The lungs are adequately inflated. The interstitial markings remain increased bilaterally. The tracheostomy appliance tip lies at the level of the clavicular heads. The left hemidiaphragm is less well demonstrated today. The cardiac silhouette remains enlarged. The pulmonary vascularity is more engorged today. The feeding tube tip projects below the inferior margin of the image. The right-sided PICC line tip projects at the junction of the SVC with the right atrium.  IMPRESSION: Interstitial edema likely secondary CHF. Left lower lobe atelectasis or pneumonia. Small left pleural effusion suspected. Minimal change since  yesterday's study.   Electronically Signed   By: David  [place] M.D.   On: 03/03/2015 07:10   Dg Chest Port 1 View  03/02/2015   CLINICAL DATA:  Respiratory distress, shortness of breath  EXAM: PORTABLE CHEST 1 VIEW  COMPARISON:  Chest radiograph from one day prior.  FINDINGS: Weighted enteric tube tip is in the proximal stomach. Right PICC terminates at the cavoatrial junction. Stable cardiomediastinal silhouette with mild cardiomegaly. No pneumothorax. No pleural effusion. There are diffuse fluffy airspace opacities throughout both lungs, asymmetrically prominent on the left, significantly worsened in the interval.  IMPRESSION: Stable mild cardiomegaly. Significant interval worsening of diffuse fluffy airspace opacities throughout both lungs, asymmetrically prominent on the left, favor worsening severe pulmonary edema, differential includes worsening multifocal pneumonia.   Electronically Signed   By: Delbert Phenix M.D.   On: 03/02/2015 16:56    LINES/TUBES: ETT 9/4 >> 9/20 R IJ CVL 9/4 >> 9/9 L IJ CVL 9/9 >> 9/24 picc 9/24>> Trach (DF) 9/20 >>  CULTURES: C diff 9/13 >> Antigen positive, toxin negative >> colonization Blood 9/22 >> negative Sputum 9/23 >> few C albicans Urine 9/23 >> multiple species  ASSESSMENT / PLAN:  PULMONARY Trach change to XLT 9/21>> A: Acute on chronic hypoxic respiratory failure - Pneumonia. Improving. Ventilator Dependent Respiratory Failure - s/p tracheostomy.  Tolerating PCV well. H/O Asthma. 10/3 > continues to talk around trach. 10/4 low volumes, trach changed, bleeding  P: Continue vent bundle. Continue to wean to cpap 8, ps 10 given peep needs this am  Continuing Xopenex nebs q6hr. Monitor trach/cuff for leaks., not noted If leak , i will upsize him to 8 extra long Check CXR in AM  CARDIOLOGY PICC 9/24>> A: A fib with RVR - CHA2DS2-VASc score 1. Acute Diastolic CHF - resolved. P: Diltiazem  via tube q6hr. ASA  daily. Monitor on  tele. Attempt to maintain even volume status, but likley to neg in am  QTC on methadone  RENAL A: Acute on Chronic Renal Failure (CKD stage III) - Resolved. Hypernatremia - resolved. Hypokalemia - Resolved. P: Dc free water Monitoring UOP. Check BMP now (none today) and in AM. Low threshold lasix  GASTROENTEROLOGY A: Diarrhea w/ flexiseal /CDiff colonization. P: Continuing Tube Feeds. Pepcid via tube q12hr. Monitor output  HEMATOLOGY A: Anemia - improving. Secondary to cricital illness. No signs of active bleeding. VTE prophylaxis. Bleeding trach post change 10/3 P: Transfuse per usual guidelines. SCDs. CBC in AM. Holding off heparin  INFECTION A: Severe CAP - No organism identified; s/p treatment with vancomycin & Merrem. Septic shock - resolved. C diff colonization. P: Follow stool output and fever curve  ENDOCRINE A: Hyperglycemia -  steroid induced; improving since steroids d/c'd. P: Continuing SSI. Accuchecks q4hr. Continuing Lantus to 30 units daily at bedtime.  NEUROLOGY A: Delirium - resolved. Chronic Pain - on Methadone. P: Continue Lyrica 200mg  po q8hr (home dose) Methadone 5mg  po q6hr. Fentanyl IV prn.   FAMILY UPDATE:  Family updated at bedside.  Ccm time 30 min  [name]. Tyson Alias, MD, FACP Pgr: [phone] [facility]

## 2015-03-03 NOTE — Progress Notes (Signed)
RT called to room due to increased RR. Placed pt back on full support at this time. Pt was continuously coughing up large,thick,tan and bloody secretions, stating he could not breath. Pt bagged, lavaged and suctioned. Pt coughed up large tan and bloody plug, which then relieved his shortness of breath.

## 2015-03-03 NOTE — Progress Notes (Signed)
0400 pt having episodes of SOB and coughing up blood, ELINK button and PCCM made aware MD Nestor to bedside with pt. Pt given 2mg  of Morphine and 2mg  of Versed. SOB and hemoptysis resolved when pt stopped coughing. Pt was transferred to 77M for comfort Fentanyl drip, pt stable, report given to [employer], RN.

## 2015-03-04 ENCOUNTER — Inpatient Hospital Stay (HOSPITAL_COMMUNITY): Payer: Medicaid Other

## 2015-03-04 LAB — CBC WITH DIFFERENTIAL/PLATELET
BASOS PCT: 0 %
Basophils Absolute: 0 10*3/uL (ref 0.0–0.1)
EOS PCT: 7 %
Eosinophils Absolute: 0.7 10*3/uL (ref 0.0–0.7)
HCT: 31.4 % — ABNORMAL LOW (ref 39.0–52.0)
HEMOGLOBIN: 9.1 g/dL — AB (ref 13.0–17.0)
Lymphocytes Relative: 7 %
Lymphs Abs: 0.7 10*3/uL (ref 0.7–4.0)
MCH: 22.4 pg — AB (ref 26.0–34.0)
MCHC: 29 g/dL — AB (ref 30.0–36.0)
MCV: 77.3 fL — AB (ref 78.0–100.0)
MONO ABS: 0.5 10*3/uL (ref 0.1–1.0)
Monocytes Relative: 5 %
NEUTROS ABS: 8 10*3/uL — AB (ref 1.7–7.7)
NEUTROS PCT: 81 %
PLATELETS: 174 10*3/uL (ref 150–400)
RBC: 4.06 MIL/uL — ABNORMAL LOW (ref 4.22–5.81)
RDW: 25.4 % — ABNORMAL HIGH (ref 11.5–15.5)
WBC: 9.9 10*3/uL (ref 4.0–10.5)

## 2015-03-04 LAB — GLUCOSE, CAPILLARY
GLUCOSE-CAPILLARY: 119 mg/dL — AB (ref 65–99)
Glucose-Capillary: 85 mg/dL (ref 65–99)
Glucose-Capillary: 91 mg/dL (ref 65–99)
Glucose-Capillary: 89 mg/dL (ref 65–99)
Glucose-Capillary: 102 mg/dL — ABNORMAL HIGH (ref 65–99)
Glucose-Capillary: 112 mg/dL — ABNORMAL HIGH (ref 65–99)

## 2015-03-04 LAB — BASIC METABOLIC PANEL
Anion gap: 7 (ref 5–15)
BUN: 24 mg/dL — AB (ref 6–20)
CO2: 33 mmol/L — ABNORMAL HIGH (ref 22–32)
CREATININE: 0.68 mg/dL (ref 0.61–1.24)
Calcium: 8 mg/dL — ABNORMAL LOW (ref 8.9–10.3)
Chloride: 96 mmol/L — ABNORMAL LOW (ref 101–111)
GFR calc Af Amer: >60 mL/min (ref >60)
GLUCOSE: 100 mg/dL — AB (ref 65–99)
POTASSIUM: 3.9 mmol/L (ref 3.5–5.1)
SODIUM: 136 mmol/L (ref 135–145)

## 2015-03-04 LAB — PHOSPHORUS: Phosphorus: 4.1 mg/dL (ref 2.5–4.6)

## 2015-03-04 LAB — MAGNESIUM: Magnesium: 2 mg/dL (ref 1.7–2.4)

## 2015-03-04 MED ORDER — INSULIN GLARGINE 100 UNIT/ML ~~LOC~~ SOLN
20.0000 [IU] | Freq: Every day | SUBCUTANEOUS | Status: DC
  Administered 2015-03-04 – 2015-03-06 (×3): 20 [IU] via SUBCUTANEOUS
  Filled 2015-03-04 (×4): qty 0.2

## 2015-03-04 MED ORDER — FUROSEMIDE 10 MG/ML IJ SOLN
40.0000 mg | Freq: Two times a day (BID) | INTRAMUSCULAR | Status: DC
  Administered 2015-03-04 – 2015-03-05 (×2): 40 mg via INTRAVENOUS
  Filled 2015-03-04 (×2): qty 4

## 2015-03-04 MED ORDER — METHADONE HCL 5 MG PO TABS
5.0000 mg | ORAL_TABLET | Freq: Two times a day (BID) | ORAL | Status: DC
  Administered 2015-03-04 – 2015-03-05 (×2): 5 mg via ORAL
  Filled 2015-03-04 (×2): qty 1

## 2015-03-04 NOTE — Progress Notes (Signed)
PULMONARY / CRITICAL CARE MEDICINE   Name: Adam Benson MRN: [id number] DOB: [birth year]    ADMISSION DATE:  02/01/2015 CONSULTATION DATE:  02/01/2015  REFERRING MD :  [facility]   CHIEF COMPLAINT:  Severe CAP  INITIAL PRESENTATION: 54 yo male from [place] with dyspnea, wt gain, edema.  Developed VDRF from PNA and ARDS, a fib with RVR.  STUDIES:  9/04 Echo > EF 50 to 55%  SIGNIFICANT EVENTS: 9/04 Intubated, transfer to [facility] 9/09 FOB, Fever 103 9/16 Repeat FOB 9/20 trach placed, fib rvr 9/21 leak, dislodged trach, changed to xtra long successful 9/24 moved to ICU and placed on PCV 10/2- to stepdown 10/3 - continues to talk around cuff, anxious to get trach out. 10/3- loss of volumes = trach change, bleeding  SUBJECTIVE: No bleeding  Weaning 10/5 O2 needs better after neg 1 l.4 liters and improved edema on opcxr  VITAL SIGNS: Temp:  [98.7 F (37.1 C)-99.4 F (37.4 C)] 99.3 F (37.4 C) (10/05 1157) Pulse Rate:  [59-116] 98 (10/05 1400) Resp:  [14-23] 15 (10/05 1400) BP: (84-153)/(45-75) 84/69 mmHg (10/05 1400) SpO2:  [92 %-100 %] 100 % (10/05 1400) FiO2 (%):  [50 %-60 %] 50 % (10/05 1200) Weight:  [127.5 kg (281 lb 1.4 oz)] 127.5 kg (281 lb 1.4 oz) (10/05 0500)   VENTILATOR SETTINGS: Vent Mode:  [-] PSV;CPAP FiO2 (%):  [50 %-60 %] 50 % Set Rate:  [16 bmp] 16 bmp PEEP:  [5 cmH20-8 cmH20] 5 cmH20 Pressure Support:  [10 cmH20] 10 cmH20   INTAKE / OUTPUT:  Intake/Output Summary (Last 24 hours) at 03/04/15 1446 Last data filed at 03/04/15 1400  Gross per 24 hour  Intake   1965 ml  Output   6825 ml  Net  -4860 ml    PHYSICAL EXAMINATION: General:  In bed on vent Integument: No rash on exposed skin.  HEENT:  Trach clean, no blood Cardiovascular:  Regular rate s1 s2 . Pulmonary:   Coarse to clear with cough Abdomen: Soft. Normal bowel sounds. Nontender. Protuberant. Neurological:  Moving all 4 extremities equally.  Follows commands and alert   CMP Latest  Ref Rng 03/04/2015 03/03/2015 03/02/2015  Glucose 65 - 99 mg/dL 875(Z) 98 972(Q)  BUN 6 - 20 mg/dL 20(U) 01(V) 61(B)  Creatinine 0.61 - 1.24 mg/dL 3.79 4.32 7.61  Sodium 135 - 145 mmol/L 136 137 139  Potassium 3.5 - 5.1 mmol/L 3.9 4.0 4.0  Chloride 101 - 111 mmol/L 96(L) 100(L) 102  CO2 22 - 32 mmol/L 33(H) 29 32  Calcium 8.9 - 10.3 mg/dL 8.0(L) 8.1(L) 8.3(L)  Total Protein 6.5 - 8.1 g/dL - - -  Total Bilirubin 0.3 - 1.2 mg/dL - - -  Alkaline Phos 38 - 126 U/L - - -  AST 15 - 41 U/L - - -  ALT 17 - 63 U/L - - -    CBC Latest Ref Rng 03/04/2015 03/03/2015 03/02/2015  WBC 4.0 - 10.5 K/uL 9.9 12.9(H) 13.5(H)  Hemoglobin 13.0 - 17.0 g/dL 4.7(W) 10.1(L) 10.4(L)  Hematocrit 39.0 - 52.0 % 31.4(L) 34.8(L) 34.9(L)  Platelets 150 - 400 K/uL 174 197 195    CBG (last 3)   Recent Labs  03/04/15 0340 03/04/15 0740 03/04/15 1130  GLUCAP 85 89 102*   ABG    Component Value Date/Time   PHART 7.466* 02/23/2015 0442   PCO2ART 55.6* 02/23/2015 0442   PO2ART 67.6* 02/23/2015 0442   HCO3 39.5* 02/23/2015 0442   TCO2 41.2 02/23/2015 0442  O2SAT 92.5 02/23/2015 0442     Dg Chest Port 1 View  03/04/2015   CLINICAL DATA:  Interstitial lung disease.  Shortness of breath.  EXAM: PORTABLE CHEST 1 VIEW  COMPARISON:  03/03/2015.  FINDINGS: PICC line and feeding tube in stable position. Cardiomegaly with with bibasilar pulmonary infiltrates suggesting pulmonary edema. Bibasilar pneumonia particular on the left cannot be excluded. Slight interim improvement from prior exam. No pleural effusion or pneumothorax.  IMPRESSION: 1. Lines and tubes stable position. 2. Cardiomegaly with bilateral pulmonary infiltrates suggesting improving congestive heart failure. Slight interim improvement from prior exam. Superimposed basilar pneumonia cannot be excluded .   Electronically Signed   By: Maisie Fus  Register   On: 03/04/2015 07:03   Dg Chest Port 1 View  03/03/2015   CLINICAL DATA:  Cough and shortness breath, acute  respiratory failure, CHF, ARDS.  EXAM: PORTABLE CHEST 1 VIEW  COMPARISON:  Portable chest x-ray of July 31, 2014  FINDINGS: The lungs are adequately inflated. The interstitial markings remain increased bilaterally. The tracheostomy appliance tip lies at the level of the clavicular heads. The left hemidiaphragm is less well demonstrated today. The cardiac silhouette remains enlarged. The pulmonary vascularity is more engorged today. The feeding tube tip projects below the inferior margin of the image. The right-sided PICC line tip projects at the junction of the SVC with the right atrium.  IMPRESSION: Interstitial edema likely secondary CHF. Left lower lobe atelectasis or pneumonia. Small left pleural effusion suspected. Minimal change since yesterday's study.   Electronically Signed   By: David  [place]iland M.D.   On: 03/03/2015 07:10   Dg Chest Port 1 View  03/02/2015   CLINICAL DATA:  Respiratory distress, shortness of breath  EXAM: PORTABLE CHEST 1 VIEW  COMPARISON:  Chest radiograph from one day prior.  FINDINGS: Weighted enteric tube tip is in the proximal stomach. Right PICC terminates at the cavoatrial junction. Stable cardiomediastinal silhouette with mild cardiomegaly. No pneumothorax. No pleural effusion. There are diffuse fluffy airspace opacities throughout both lungs, asymmetrically prominent on the left, significantly worsened in the interval.  IMPRESSION: Stable mild cardiomegaly. Significant interval worsening of diffuse fluffy airspace opacities throughout both lungs, asymmetrically prominent on the left, favor worsening severe pulmonary edema, differential includes worsening multifocal pneumonia.   Electronically Signed   By: Delbert Phenix M.D.   On: 03/02/2015 16:56    LINES/TUBES: ETT 9/4 >> 9/20 R IJ CVL 9/4 >> 9/9 L IJ CVL 9/9 >> 9/24 picc 9/24>> Trach (DF) 9/20 >>  CULTURES: C diff 9/13 >> Antigen positive, toxin negative >> colonization Blood 9/22 >> negative Sputum 9/23 >> few C  albicans Urine 9/23 >> multiple species  ASSESSMENT / PLAN:  PULMONARY Trach change to XLT 9/21>> A: Acute on chronic hypoxic respiratory failure - Pneumonia. Improving. Ventilator Dependent Respiratory Failure - s/p tracheostomy.  Tolerating PCV well. H/O Asthma. 10/3 > continues to talk around trach. 10/4 low volumes, trach changed, bleeding  P: Continue vent bundle. fio2 to 50% cpap 5 ps 10, goal 8 hrs Neg balance as tolerated  CARDIOLOGY PICC 9/24>> A: A fib with RVR - CHA2DS2-VASc score 1. Acute Diastolic CHF - resolved. P: Diltiazem  via tube q6hr. ASA  daily. Monitor on tele. lasix QTC on methadone wnl  RENAL A: Acute on Chronic Renal Failure (CKD stage III) - Resolved. Hypernatremia - resolved. Hypokalemia - Resolved. P: Monitoring UOP. Check BMP am Maintain lasix  GASTROENTEROLOGY A: Diarrhea w/ flexiseal /CDiff colonization. P: Continuing Tube Feeds. Pepcid via  tube q12hr. Consider peg  HEMATOLOGY A: Anemia - improving. Secondary to cricital illness. No signs of active bleeding. VTE prophylaxis. Bleeding trach post change 10/3 P: Transfuse per usual guidelines. SCDs. CBC in AM. Holding off heparin, may consider restart in am if trach remains no bleed  INFECTION A: Severe CAP - No organism identified; s/p treatment with vancomycin & Merrem. Septic shock - resolved. C diff colonization. P: Follow stool output and fever curve  ENDOCRINE A: Hyperglycemia - resolved Low glu 85 P: Continuing SSI. Accuchecks q4hr. Continuing Lantus to reduce to 20   NEUROLOGY A: Delirium - resolved. Chronic Pain - on Methadone. P: Continue Lyrica  po q8hr (home dose) Methadone  change to q12h PT active Fentanyl IV prn.  Await sdu bed  [name]. Tyson Alias, MD, FACP Pgr: [phone] [facility]

## 2015-03-04 NOTE — Progress Notes (Signed)
Nutrition Follow-up  DOCUMENTATION CODES:   Morbid obesity  INTERVENTION:    Continue TF via small NGT (panda) with Vital High Protein at goal rate of 75 ml/h (1800 ml per day) with Prostat 30 ml TID to provide 2100 kcals, 203 gm protein, 1505 ml free water daily.  NUTRITION DIAGNOSIS:   Inadequate oral intake related to inability to eat as evidenced by NPO status.  Ongoing  GOAL:   Provide needs based on ASPEN/SCCM guidelines  Met  MONITOR:   TF tolerance, Vent status, Labs, Weight trends   ASSESSMENT:   Presented to [facility] after a few days of increasing dyspnea, weight gain, & lower extremity edema. Patient initially tried on BiPAP. Required intubation and transferred to [facility] on 9/4.  Discussed patient in ICU rounds and with RN today. Patient with trach, on ventilator. Tolerating TF well via NGT; currently receiving Vital High Protein via NGT at 75 ml/h (1800 ml/day) with Prostat 30 ml TID to provide 2100 kcals, 203 gm protein, 1505 ml free water daily.   Diet Order:   NPO  Skin:  Reviewed, no issues  Last BM:  10/4  Height:   Ht Readings from Last 1 Encounters:  03/01/15 6' (1.829 m)    Weight:   Wt Readings from Last 1 Encounters:  03/04/15 281 lb 1.4 oz (127.5 kg)    Ideal Body Weight:  80.9 kg  BMI:  Body mass index is 38.11 kg/(m^2).  Estimated Nutritional Needs:   Kcal:  2575-0518  Protein:  202 gm  Fluid:  2.5 L  EDUCATION NEEDS:   No education needs identified at this time   Molli Barrows, [place], [place], [place] Pager [phone] After Hours Pager [phone]

## 2015-03-04 NOTE — Progress Notes (Signed)
PT Cancellation Note  Patient Details Name: Adam Benson MRN: [id number] DOB: 09-Jul-1960   Cancelled Treatment:    Reason Eval/Treat Not Completed: Pt refusing to work with therapy at this time. Offered OOB, EOB, and therapeutic exercise and pt declines all options. States he does not feel like it, and upon further questioning admits he is concerned about his breathing when he moves. Pt was educated on benefits of participating with therapy, and pt states "I understand all that." Will continue to follow and progress as able per POC.    Conni Slipper 03/04/2015, 1:50 PM   Conni Slipper, PT, DPT Acute Rehabilitation Services Pager: [phone]

## 2015-03-05 LAB — BASIC METABOLIC PANEL
ANION GAP: 6 (ref 5–15)
BUN: 27 mg/dL — ABNORMAL HIGH (ref 6–20)
CO2: 36 mmol/L — AB (ref 22–32)
Calcium: 8.2 mg/dL — ABNORMAL LOW (ref 8.9–10.3)
Chloride: 96 mmol/L — ABNORMAL LOW (ref 101–111)
Creatinine, Ser: 0.73 mg/dL (ref 0.61–1.24)
GFR calc Af Amer: >60 mL/min (ref >60)
GLUCOSE: 110 mg/dL — AB (ref 65–99)
POTASSIUM: 3.5 mmol/L (ref 3.5–5.1)
Sodium: 138 mmol/L (ref 135–145)

## 2015-03-05 LAB — CBC
HEMATOCRIT: 31.9 % — AB (ref 39.0–52.0)
HEMOGLOBIN: 9.5 g/dL — AB (ref 13.0–17.0)
MCH: 23 pg — AB (ref 26.0–34.0)
MCHC: 29.8 g/dL — AB (ref 30.0–36.0)
MCV: 77.2 fL — ABNORMAL LOW (ref 78.0–100.0)
Platelets: 162 10*3/uL (ref 150–400)
RBC: 4.13 MIL/uL — ABNORMAL LOW (ref 4.22–5.81)
RDW: 25.5 % — ABNORMAL HIGH (ref 11.5–15.5)
WBC: 9.2 10*3/uL (ref 4.0–10.5)

## 2015-03-05 LAB — GLUCOSE, CAPILLARY
GLUCOSE-CAPILLARY: 102 mg/dL — AB (ref 65–99)
GLUCOSE-CAPILLARY: 119 mg/dL — AB (ref 65–99)
Glucose-Capillary: 135 mg/dL — ABNORMAL HIGH (ref 65–99)
Glucose-Capillary: 87 mg/dL (ref 65–99)

## 2015-03-05 MED ORDER — DILTIAZEM 12 MG/ML ORAL SUSPENSION
30.0000 mg | Freq: Two times a day (BID) | ORAL | Status: DC
  Administered 2015-03-05 – 2015-03-06 (×2): 30 mg
  Filled 2015-03-05 (×3): qty 3

## 2015-03-05 MED ORDER — AMIODARONE LOAD VIA INFUSION
150.0000 mg | Freq: Once | INTRAVENOUS | Status: AC
  Administered 2015-03-05: 150 mg via INTRAVENOUS
  Filled 2015-03-05: qty 83.34

## 2015-03-05 MED ORDER — HEPARIN SODIUM (PORCINE) 5000 UNIT/ML IJ SOLN
5000.0000 [IU] | Freq: Three times a day (TID) | INTRAMUSCULAR | Status: DC
  Administered 2015-03-05 – 2015-03-09 (×13): 5000 [IU] via SUBCUTANEOUS
  Filled 2015-03-05 (×13): qty 1

## 2015-03-05 MED ORDER — POTASSIUM CHLORIDE 20 MEQ/15ML (10%) PO SOLN
20.0000 meq | ORAL | Status: AC
  Administered 2015-03-05 (×2): 20 meq
  Filled 2015-03-05 (×2): qty 15

## 2015-03-05 MED ORDER — METHADONE HCL 5 MG PO TABS
2.5000 mg | ORAL_TABLET | Freq: Two times a day (BID) | ORAL | Status: DC
  Administered 2015-03-05 – 2015-03-09 (×8): 2.5 mg via ORAL
  Filled 2015-03-05 (×8): qty 1

## 2015-03-05 MED ORDER — AMIODARONE HCL IN DEXTROSE 360-4.14 MG/200ML-% IV SOLN
30.0000 mg/h | INTRAVENOUS | Status: DC
  Administered 2015-03-06: 30 mg/h via INTRAVENOUS
  Filled 2015-03-05: qty 200

## 2015-03-05 MED ORDER — FUROSEMIDE 10 MG/ML IJ SOLN
20.0000 mg | Freq: Two times a day (BID) | INTRAMUSCULAR | Status: DC
  Administered 2015-03-05 – 2015-03-11 (×12): 20 mg via INTRAVENOUS
  Filled 2015-03-05 (×12): qty 2

## 2015-03-05 MED ORDER — AMIODARONE HCL IN DEXTROSE 360-4.14 MG/200ML-% IV SOLN
60.0000 mg/h | INTRAVENOUS | Status: DC
  Administered 2015-03-05 – 2015-03-06 (×3): 60 mg/h via INTRAVENOUS
  Filled 2015-03-05 (×2): qty 200

## 2015-03-05 NOTE — Progress Notes (Addendum)
eLink Physician-Brief Progress Note Patient Name: Adam Benson DOB: [birth year] MRN: [id number]   Date of Service  03/05/2015  HPI/Events of Note  Chronic AFIB. Now with RVR (HR 125-155). Already on Cardizem PO. BP = 91/64.  eICU Interventions  Will order: 1. Amiodarone IV bolus and infusion. 2. BMP and Magnesium level now.      Intervention Category Major Interventions: Arrhythmia - evaluation and management  Sommer,Steven Eugene 03/05/2015, 10:11 PM

## 2015-03-05 NOTE — Progress Notes (Signed)
Physical Therapy Treatment Patient Details Name: Adam Benson MRN: [id number] DOB: 1961/01/24 Today's Date: 03/05/2015    History of Present Illness Pt is a 54 y/o male who was transferred from [facility] with dyspnea, weight gain and LE edema. He failed BiPap, was intubated 9/4 and transferred to [facility]. He was found to have CAP with ARDS. ICU couse has been complicated by a-fib with RVR and bradycardia.     PT Comments    Pt progressing towards physical therapy goals. Was willing to attempt OOB this session and voiced concern that he didn't have the strength to stand up. With RW and assist from therapist, pt was able to power-up to full stand and take pivotal steps around to the recliner chair. RN present during session and reports HR has been elevated - was helping to monitor vitals throughout transfer. Pt reports no complaints outside of ~1 minute of difficulty breathing once in the chair. Will continue to follow and progress as able per POC.   Follow Up Recommendations  SNF;Supervision/Assistance - 24 hour     Equipment Recommendations  Rolling walker with 5" wheels;Wheelchair (measurements PT);Wheelchair cushion (measurements PT)    Recommendations for Other Services       Precautions / Restrictions Precautions Precautions: Fall Precaution Comments: Trach, Vent, NG tube, Foley, Rectal tube, Multiple IV lines Restrictions Weight Bearing Restrictions: No    Mobility  Bed Mobility Overal bed mobility: Needs Assistance;+ 2 for safety/equipment Bed Mobility: Supine to Sit     Supine to sit: Supervision     General bed mobility comments: HOB elevated. Assist for management of lines.   Transfers Overall transfer level: Needs assistance Equipment used: Rolling walker (2 wheeled) Transfers: Sit to/from [employer] Sit to Stand: Min assist;+2 physical assistance;+2 safety/equipment Stand pivot transfers: Min assist;+2 safety/equipment;+2 physical  assistance       General transfer comment: Pt was able to power-up to full standing position and take a few pivotal steps around to the recliner chair. RN was present for management of vent lines. Pt required x2 attempts to achieve full stand.  Ambulation/Gait                 Stairs            Wheelchair Mobility    Modified Rankin (Stroke Patients Only)       Balance Overall balance assessment: Needs assistance Sitting-balance support: Feet supported;No upper extremity supported Sitting balance-Leahy Scale: Fair     Standing balance support: Bilateral upper extremity supported Standing balance-Leahy Scale: Poor                      Cognition Arousal/Alertness: Awake/alert Behavior During Therapy: Flat affect Overall Cognitive Status: Within Functional Limits for tasks assessed                      Exercises      General Comments General comments (skin integrity, edema, etc.): HR increased to 197 bpm briefly after transition to chair. RN present and monitoring vitals throughout session.       Pertinent Vitals/Pain Pain Assessment: No/denies pain    Home Living                      Prior Function            PT Goals (current goals can now be found in the care plan section) Acute Rehab PT Goals Patient Stated Goal: To be able  to do more PT Goal Formulation: With patient Time For Goal Achievement: 03/19/15 Potential to Achieve Goals: Good Progress towards PT goals: Progressing toward goals    Frequency  Min 2X/week    PT Plan Current plan remains appropriate    Co-evaluation             End of Session Equipment Utilized During Treatment: Gait belt;Oxygen Activity Tolerance: Patient tolerated treatment well Patient left: in chair;with call bell/phone within reach;with nursing/sitter in room     Time: 1111-1135 PT Time Calculation (min) (ACUTE ONLY): 24 min  Charges:  $Therapeutic Activity: 23-37  mins                    G Codes:      Conni Slipper Mar 23, 2015, 1:53 PM   Conni Slipper, PT, DPT Acute Rehabilitation Services Pager: [phone]

## 2015-03-05 NOTE — Progress Notes (Signed)
SLP Cancellation Note  Patient Details Name: Adam Benson MRN: [id number] DOB: December 12, 1960   Cancelled treatment:       Reason Eval/Treat Not Completed:  (Pt may go on trach collar, PMSV and swallow eval tomorrow)   Claudine Mouton 03/05/2015, 2:01 PM

## 2015-03-05 NOTE — Progress Notes (Signed)
75cc Fentanyl IV drip wasted. Witnessed by Caralee Ates, RN

## 2015-03-05 NOTE — Progress Notes (Signed)
Patient HR increased to 145 and RR increased to 45. RT placed patient on ventilator. HR is now 130 and RR is 26. RN is aware.  RT will continue to monitor.

## 2015-03-05 NOTE — Progress Notes (Signed)
Pt is on ATC 98% at this time, o2 saturations presents at 92-95%. Pt is prepped for transfer to 2C. Unit RT aware.

## 2015-03-05 NOTE — Progress Notes (Signed)
PULMONARY / CRITICAL CARE MEDICINE   Name: Adam Benson MRN: [id number] DOB: Jan 14, 1961    ADMISSION DATE:  02/01/2015 CONSULTATION DATE:  02/01/2015  REFERRING MD :  [facility]   CHIEF COMPLAINT:  Severe CAP  INITIAL PRESENTATION: 54 yo male from [place] with dyspnea, wt gain, edema.  Developed VDRF from PNA and ARDS, a fib with RVR.  STUDIES:  9/04 Echo > EF 50 to 55%  SIGNIFICANT EVENTS: 9/04 Intubated, transfer to [facility] 9/09 FOB, Fever 103 9/16 Repeat FOB 9/20 trach placed, fib rvr 9/21 leak, dislodged trach, changed to xtra long successful 9/24 moved to ICU and placed on PCV 10/2- to stepdown 10/3 - continues to talk around cuff, anxious to get trach out. 10/3- loss of volumes = trach change, bleeding  SUBJECTIVE: In chair weaning well PS 10  VITAL SIGNS: Temp:  [98.1 F (36.7 C)-99.6 F (37.6 C)] 99.6 F (37.6 C) (10/06 1218) Pulse Rate:  [37-176] 119 (10/06 1200) Resp:  [0-30] 22 (10/06 1200) BP: (84-120)/(47-75) 88/57 mmHg (10/06 1200) SpO2:  [94 %-100 %] 98 % (10/06 1200) FiO2 (%):  [50 %] 50 % (10/06 1200)   VENTILATOR SETTINGS: Vent Mode:  [-] PSV;CPAP FiO2 (%):  [50 %] 50 % Set Rate:  [16 bmp] 16 bmp Vt Set:  [570 mL] 570 mL PEEP:  [5 cmH20] 5 cmH20 Pressure Support:  [10 cmH20] 10 cmH20 Plateau Pressure:  [17 cmH20-19 cmH20] 17 cmH20   INTAKE / OUTPUT:  Intake/Output Summary (Last 24 hours) at 03/05/15 1246 Last data filed at 03/05/15 1200  Gross per 24 hour  Intake   2085 ml  Output   6075 ml  Net  -3990 ml    PHYSICAL EXAMINATION: General:  In chair, calm on PS 10 Integument: No rash on exposed skin.  HEENT:  Trach clean Cardiovascular:  Regular rate s1 s2 . Pulmonary:   CTA Abdomen: Soft. Normal bowel sounds. Nontender. Protuberant. Neurological:  Moving all 4 extremities equally.  Follows commands and alert , cooperative  CMP Latest Ref Rng 03/05/2015 03/04/2015 03/03/2015  Glucose 65 - 99 mg/dL 409(W) 119(J) 98  BUN 6 - 20  mg/dL 47(W) 29(F) 62(Z)  Creatinine 0.61 - 1.24 mg/dL 3.08 6.57 8.46  Sodium 135 - 145 mmol/L 138 136 137  Potassium 3.5 - 5.1 mmol/L 3.5 3.9 4.0  Chloride 101 - 111 mmol/L 96(L) 96(L) 100(L)  CO2 22 - 32 mmol/L 36(H) 33(H) 29  Calcium 8.9 - 10.3 mg/dL 8.2(L) 8.0(L) 8.1(L)  Total Protein 6.5 - 8.1 g/dL - - -  Total Bilirubin 0.3 - 1.2 mg/dL - - -  Alkaline Phos 38 - 126 U/L - - -  AST 15 - 41 U/L - - -  ALT 17 - 63 U/L - - -    CBC Latest Ref Rng 03/05/2015 03/04/2015 03/03/2015  WBC 4.0 - 10.5 K/uL 9.2 9.9 12.9(H)  Hemoglobin 13.0 - 17.0 g/dL 9.6(E) 9.5(M) 10.1(L)  Hematocrit 39.0 - 52.0 % 31.9(L) 31.4(L) 34.8(L)  Platelets 150 - 400 K/uL 162 174 197    CBG (last 3)   Recent Labs  03/04/15 1915 03/04/15 2338 03/05/15 0734  GLUCAP 119* 135* 102*   ABG    Component Value Date/Time   PHART 7.466* 02/23/2015 0442   PCO2ART 55.6* 02/23/2015 0442   PO2ART 67.6* 02/23/2015 0442   HCO3 39.5* 02/23/2015 0442   TCO2 41.2 02/23/2015 0442   O2SAT 92.5 02/23/2015 0442     Dg Chest Port 1 View  03/04/2015  CLINICAL DATA:  Interstitial lung disease.  Shortness of breath.  EXAM: PORTABLE CHEST 1 VIEW  COMPARISON:  03/03/2015.  FINDINGS: PICC line and feeding tube in stable position. Cardiomegaly with with bibasilar pulmonary infiltrates suggesting pulmonary edema. Bibasilar pneumonia particular on the left cannot be excluded. Slight interim improvement from prior exam. No pleural effusion or pneumothorax.  IMPRESSION: 1. Lines and tubes stable position. 2. Cardiomegaly with bilateral pulmonary infiltrates suggesting improving congestive heart failure. Slight interim improvement from prior exam. Superimposed basilar pneumonia cannot be excluded .   Electronically Signed   By: Maisie Fus  Register   On: 03/04/2015 07:03    LINES/TUBES: ETT 9/4 >> 9/20 R IJ CVL 9/4 >> 9/9 L IJ CVL 9/9 >> 9/24 picc 9/24>> Trach (DF) 9/20 >>  CULTURES: C diff 9/13 >> Antigen positive, toxin negative >>  colonization Blood 9/22 >> negative Sputum 9/23 >> few C albicans Urine 9/23 >> multiple species  ASSESSMENT / PLAN:  PULMONARY Trach change to XLT 9/21>> A: Acute on chronic hypoxic respiratory failure - Pneumonia. Improving. Ventilator Dependent Respiratory Failure - s/p tracheostomy.  Tolerating PCV well. H/O Asthma. 10/3 > continues to talk around trach. 10/4 low volumes, trach changed, bleeding  P: Continue vent bundle. fio2 to 50% cpap 5 ps 5 , if able would like to move to tc Neg 6.4 liters last 24 hrs has helped weaning In line PMV ok  CARDIOLOGY PICC 9/24>> A: A fib with RVR - CHA2DS2-VASc score 1. Acute Diastolic CHF - resolved. P: Diltiazem 30mg  via tube to q12h ASA 325mg  daily. Monitor on tele. Lasix, slight reduce  butcontinue QTC daily  RENAL A: Acute on Chronic Renal Failure (CKD stage III) - Resolved. Hypernatremia - resolved. Hypokalemia - Resolved. Awesome diuresis with improved resp status P: Monitoring UOP. Check BMP am k supp Maintain lasix, to 20 mg  GASTROENTEROLOGY A: Diarrhea w/ flexiseal /CDiff colonization. P: Continuing Tube Feeds. Pepcid via tube q12hr. Hold off peg as may go to Trach collar  HEMATOLOGY A: Anemia - improving. Secondary to cricital illness. No signs of active bleeding. VTE prophylaxis. Bleeding trach post change 10/3 P: Transfuse per usual guidelines. SCDs. CBC in AM. Restart sub q hep, no bleeding further  INFECTION A: Severe CAP - No organism identified; s/p treatment with vancomycin & Merrem. Septic shock - resolved. C diff colonization. P: No fevers Get off vent as able, will increase vap with vent support  ENDOCRINE A: Hyperglycemia - resolved Low glu 85 P: Continuing SSI. Accuchecks q4hr. Continuing Lantus at 20   NEUROLOGY A: Delirium - resolved. Chronic Pain - on Methadone. P: Continue Lyrica 200mg  po q8hr (home dose) Methadone 5mg  change to q12h, to 2.5 mg for a few days PT  active Fentanyl IV prn.  Await sdu bed for 4 days  [name]. Tyson Alias, MD, FACP Pgr: [phone] [facility]

## 2015-03-05 NOTE — Progress Notes (Signed)
Patient seen for trach team follow up.  No education needed at this time.  All needed equipment at bedside.  Will continue to follow.

## 2015-03-05 NOTE — Progress Notes (Signed)
[facility] ADULT ICU REPLACEMENT PROTOCOL FOR AM LAB REPLACEMENT ONLY  The patient does apply for the [facility] Adult ICU Electrolyte Replacment Protocol based on the criteria listed below:   1. Is GFR >/= 40 ml/min? Yes.    Patient's GFR today is >60 2. Is urine output >/= 0.5 ml/kg/hr for the last 6 hours? Yes.   Patient's UOP is 1.3 ml/kg/hr 3. Is BUN < 60 mg/dL? Yes.    Patient's BUN today is 27 4. Abnormal electrolyte(s):K3.5 5. Ordered repletion with: per protocol 6. If a panic level lab has been reported, has the CCM MD in charge been notified? Yes.  .   Physician:  Belia Heman MD  Melrose Nakayama 03/05/2015 6:00 AM

## 2015-03-05 NOTE — Trach Care Team (Addendum)
Trach Care Progression Note   Patient Details Name: Adam Benson MRN: [id number] DOB: 01-29-1961 Today's Date: 03/05/2015   Tracheostomy Assessment    Tracheostomy  6 mm Cuffed (Active)  Status Secured 03/05/2015 12:00 PM  Site Assessment Clean;Dry 03/05/2015 12:00 PM  Site Care Cleansed;Dried;Dressing applied 03/05/2015 12:00 PM  Inner Cannula Care Changed/new 03/05/2015 12:00 PM  Ties Assessment Clean;Dry;Secure 03/05/2015 12:00 PM  Cuff pressure (cm) 26 cm 03/04/2015  3:04 PM  Trach Changed Yes 03/02/2015  6:10 PM  Emergency Equipment at bedside Yes 03/05/2015 12:00 PM     Care Needs     Respiratory Therapy Tracheostomy: Chronic trach O2 Device: Ventilator FiO2 (%): 50 % SpO2: 99 % Education: Ongoing Who was educated?: Patient Follow up recommendations:  (will follow for progression) Respiratory barriers to progression: Other (comment) (requiring vent support)    Speech Language Pathology  SLP chart review complete: Patient currently receiving at SLP services. Plan for PMSV on ATC or vent tomorrow with FEES if still on vent. Patient may use Passy-Muir Speech Valve: with SLP only PMSV Supervision: Full Follow up Recommendations: Inpatient Rehab   Physical Therapy PT Recommendation/Assessment: Patient needs continued PT services Follow Up Recommendations: SNF, Supervision/Assistance - 24 hour PT equipment: Rolling walker with 5" wheels, Wheelchair (measurements PT), Wheelchair cushion (measurements PT)    Occupational Therapy      Nutritional Patient's Current Diet: Tube feeding Tube Feeding: Vital High Protein Tube Feeding Frequency: Continuous Tube Feeding Strength: Full strength SLP Diet Recommendations: NPO    Case Management/Social Work Level of patient care prior to hospitalization: Home-Self care Living status: Family (document relation) (parent) Insurance payer: Medicaid Anticipated discharge disposition: LTACH            Harlon Ditty, [place] CCC-SLP  [phone]  Claudine Mouton 03/05/2015, 2:16 PM

## 2015-03-06 ENCOUNTER — Inpatient Hospital Stay (HOSPITAL_COMMUNITY): Payer: Medicaid Other

## 2015-03-06 LAB — GLUCOSE, CAPILLARY
GLUCOSE-CAPILLARY: 135 mg/dL — AB (ref 65–99)
Glucose-Capillary: 119 mg/dL — ABNORMAL HIGH (ref 65–99)
Glucose-Capillary: 88 mg/dL (ref 65–99)
Glucose-Capillary: 123 mg/dL — ABNORMAL HIGH (ref 65–99)
Glucose-Capillary: 122 mg/dL — ABNORMAL HIGH (ref 65–99)
Glucose-Capillary: 141 mg/dL — ABNORMAL HIGH (ref 65–99)

## 2015-03-06 LAB — BASIC METABOLIC PANEL
Anion gap: 10 (ref 5–15)
Anion gap: 9 (ref 5–15)
BUN: 27 mg/dL — AB (ref 6–20)
BUN: 30 mg/dL — AB (ref 6–20)
CALCIUM: 8.1 mg/dL — AB (ref 8.9–10.3)
CALCIUM: 8.4 mg/dL — AB (ref 8.9–10.3)
CHLORIDE: 87 mmol/L — AB (ref 101–111)
CO2: 35 mmol/L — ABNORMAL HIGH (ref 22–32)
CO2: 35 mmol/L — AB (ref 22–32)
CREATININE: 0.78 mg/dL (ref 0.61–1.24)
CREATININE: 0.79 mg/dL (ref 0.61–1.24)
Chloride: 89 mmol/L — ABNORMAL LOW (ref 101–111)
GFR calc non Af Amer: >60 mL/min (ref >60)
Glucose, Bld: 127 mg/dL — ABNORMAL HIGH (ref 65–99)
Glucose, Bld: 123 mg/dL — ABNORMAL HIGH (ref 65–99)
Potassium: 3.8 mmol/L (ref 3.5–5.1)
Potassium: 3.1 mmol/L — ABNORMAL LOW (ref 3.5–5.1)
SODIUM: 131 mmol/L — AB (ref 135–145)
SODIUM: 134 mmol/L — AB (ref 135–145)

## 2015-03-06 LAB — MAGNESIUM
MAGNESIUM: 1.8 mg/dL (ref 1.7–2.4)
Magnesium: 1.7 mg/dL (ref 1.7–2.4)

## 2015-03-06 LAB — PHOSPHORUS: PHOSPHORUS: 3.9 mg/dL (ref 2.5–4.6)

## 2015-03-06 MED ORDER — POTASSIUM CHLORIDE 20 MEQ/15ML (10%) PO SOLN
40.0000 meq | Freq: Once | ORAL | Status: AC
  Administered 2015-03-06: 40 meq
  Filled 2015-03-06: qty 30

## 2015-03-06 MED ORDER — DILTIAZEM 12 MG/ML ORAL SUSPENSION
30.0000 mg | Freq: Three times a day (TID) | ORAL | Status: DC
  Administered 2015-03-06 – 2015-03-07 (×4): 30 mg
  Filled 2015-03-06 (×6): qty 3

## 2015-03-06 NOTE — Care Management Note (Signed)
Case Management Note  Patient Details  Name: Adam Benson MRN: [id number] Date of Birth: 11/19/1960  Subjective/Objective:    Pt lives alone and PT recommends SNF vs CIR but pt adamant that he will not go out of [facility] for rehab.  Dad lives 3 miles away and checks on him frequently.  Pt is interested in discharging home with e health services - [facility] liaison will determine if pt eligible for any private duty services.                           Expected Discharge Plan:  Home w Home Health Services  Discharge planning Services  CM Consult  Status of Service:  In process, will continue to follow  Magdalene River, RN 03/06/2015, 2:04 PM

## 2015-03-06 NOTE — Progress Notes (Signed)
PULMONARY / CRITICAL CARE MEDICINE   Name: Adam Benson MRN: [id number] DOB: May 19, 1961    ADMISSION DATE:  02/01/2015 CONSULTATION DATE:  02/01/2015  REFERRING MD :  [facility]   CHIEF COMPLAINT:  Severe CAP  INITIAL PRESENTATION: 54 yo male from [place] with dyspnea, wt gain, edema.  Developed VDRF from PNA and ARDS, a fib with RVR.  STUDIES:  9/04 Echo > EF 50 to 55%  SIGNIFICANT EVENTS: 9/04 Intubated, transfer to [facility] 9/09 FOB, Fever 103 9/16 Repeat FOB 9/20 trach placed, fib rvr 9/21 leak, dislodged trach, changed to xtra long successful 9/24 moved to ICU and placed on PCV 10/2- to stepdown 10/3 - continues to talk around cuff, anxious to get trach out. 10/3- loss of volumes = trach change, bleeding 10/6 ATC x 6h , amio gtt for AF-RVR & hypotension  SUBJECTIVE: Afebrile No obvious pain No more tach bleeding Good UO with lasix On ATC  VITAL SIGNS: Temp:  [98.3 F (36.8 C)-99.7 F (37.6 C)] 98.3 F (36.8 C) (10/07 0800) Pulse Rate:  [34-144] 94 (10/07 1000) Resp:  [15-34] 24 (10/07 1000) BP: (80-117)/(48-76) 96/65 mmHg (10/07 1000) SpO2:  [79 %-100 %] 90 % (10/07 1000) FiO2 (%):  [50 %-98 %] 50 % (10/07 0819)   VENTILATOR SETTINGS: Vent Mode:  [-] PCV FiO2 (%):  [50 %-98 %] 50 % Set Rate:  [16 bmp] 16 bmp Vt Set:  [570 mL] 570 mL PEEP:  [5 cmH20-8 cmH20] 8 cmH20 Pressure Support:  [5 cmH20] 5 cmH20 Plateau Pressure:  [15 cmH20-22 cmH20] 22 cmH20   INTAKE / OUTPUT:  Intake/Output Summary (Last 24 hours) at 03/06/15 1134 Last data filed at 03/06/15 1000  Gross per 24 hour  Intake 3903.22 ml  Output   2850 ml  Net 1053.22 ml    PHYSICAL EXAMINATION: General:  In bed, chr ill, , calm  Integument: No rash on exposed skin.  HEENT:  Trach clean Cardiovascular:  Regular rate s1 s2 . Pulmonary:   CTA Abdomen: Soft. Normal bowel sounds. Nontender. Protuberant. Neurological:  Moving all 4 extremities equally.  Follows commands and alert ,  cooperative  CMP Latest Ref Rng 03/06/2015 03/06/2015 03/05/2015  Glucose 65 - 99 mg/dL 829(F) 621(H) 086(V)  BUN 6 - 20 mg/dL 78(I) 69(G) 29(B)  Creatinine 0.61 - 1.24 mg/dL 2.84 1.32 4.40  Sodium 135 - 145 mmol/L 131(L) 134(L) 138  Potassium 3.5 - 5.1 mmol/L 3.1(L) 3.8 3.5  Chloride 101 - 111 mmol/L 87(L) 89(L) 96(L)  CO2 22 - 32 mmol/L 35(H) 35(H) 36(H)  Calcium 8.9 - 10.3 mg/dL 8.1(L) 8.4(L) 8.2(L)  Total Protein 6.5 - 8.1 g/dL - - -  Total Bilirubin 0.3 - 1.2 mg/dL - - -  Alkaline Phos 38 - 126 U/L - - -  AST 15 - 41 U/L - - -  ALT 17 - 63 U/L - - -    CBC Latest Ref Rng 03/05/2015 03/04/2015 03/03/2015  WBC 4.0 - 10.5 K/uL 9.2 9.9 12.9(H)  Hemoglobin 13.0 - 17.0 g/dL 1.0(U) 7.2(Z) 10.1(L)  Hematocrit 39.0 - 52.0 % 31.9(L) 31.4(L) 34.8(L)  Platelets 150 - 400 K/uL 162 174 197    CBG (last 3)   Recent Labs  03/05/15 1914 03/06/15 0010 03/06/15 0444  GLUCAP 119* 135* 119*   ABG    Component Value Date/Time   PHART 7.466* 02/23/2015 0442   PCO2ART 55.6* 02/23/2015 0442   PO2ART 67.6* 02/23/2015 0442   HCO3 39.5* 02/23/2015 0442   TCO2 41.2 02/23/2015  5379   O2SAT 92.5 02/23/2015 0442     Dg Chest Port 1 View  03/06/2015   CLINICAL DATA:  ARDS.  Subsequent encounter.  EXAM: PORTABLE CHEST 1 VIEW  COMPARISON:  03/04/2015  FINDINGS: Bilateral ill-defined interstitial and hazy airspace opacities, predominantly in the lower lungs, are stable. No new lung abnormalities. No obvious pleural effusion and no pneumothorax evident  Cardiac silhouette is mildly enlarged.  Tracheostomy tube, enteric feeding tube and right PICC are stable.  IMPRESSION: 1. Persistent lung opacities without change from the prior exam. 2. Support apparatus is also stable, and well-positioned.   Electronically Signed   By: Amie Portland M.D.   On: 03/06/2015 07:19   Dg Abd Portable 1v  03/06/2015   CLINICAL DATA:  Feeding tube placement  EXAM: PORTABLE ABDOMEN - 1 VIEW  COMPARISON:  February 24, 2015   FINDINGS: Feeding tube tip is in the stomach. Bowel gas pattern is unremarkable. No demonstrable obstruction or free air.  IMPRESSION: Feeding tube tip in stomach.  Bowel gas pattern unremarkable.   Electronically Signed   By: Bretta Bang III M.D.   On: 03/06/2015 07:58    LINES/TUBES: ETT 9/4 >> 9/20 R IJ CVL 9/4 >> 9/9 L IJ CVL 9/9 >> 9/24 picc 9/24>> Trach (DF) 9/20 >>  CULTURES: C diff 9/13 >> Antigen positive, toxin negative >> colonization Blood 9/22 >> negative Sputum 9/23 >> few C albicans Urine 9/23 >> multiple species  ASSESSMENT / PLAN:  PULMONARY Trach change to XLT 9/21>> A: Acute on chronic hypoxic respiratory failure - Pneumonia. Improving with diuresis Ventilator Dependent Respiratory Failure - s/p tracheostomy.  H/O Asthma. 10/4 low volumes, trach changed, bleeding  P: ATC daytime, noct vent , goal 24h ATC eventually fio2 titrate to satn 90% In line PMV ok  CARDIOLOGY PICC 9/24>> A: A fib with RVR - CHA2DS2-VASc score 1. Acute Diastolic CHF - resolved. P: Diltiazem 30mg  via tube to q8 h ASA 325mg  daily. Monitor on tele. Lasix 20 q 12h as Bp permits QTC daily Dc amio -not long term option anyways since ILD  RENAL A: Acute on Chronic Renal Failure (CKD stage III) - Resolved. Hypernatremia - resolved. Hypokalemia - Resolved. P: replte lytes as needed Maintain lasix, to 20 mg as BP permits  GASTROENTEROLOGY A: Diarrhea w/ flexiseal /CDiff colonization. P: Continuing Tube Feeds. Pepcid via tube q12hr. Hold off peg as may go to Trach collar  HEMATOLOGY A: Anemia - improving. Secondary to cricital illness. No signs of active bleeding. VTE prophylaxis. Bleeding trach post change 10/3 P: Transfuse per usual guidelines. SCDs. CBC in AM. Restart sub q hep, no bleeding further  INFECTION A: Severe CAP - No organism identified; s/p treatment with vancomycin & Merrem. Septic shock - resolved. C diff colonization. P: No  fevers  ENDOCRINE A: Hyperglycemia - resolved Low glu 85 P: Continuing SSI. Accuchecks q4hr. Continuing Lantus at 20   NEUROLOGY A: Delirium - resolved. Chronic Pain - on Methadone. P: Continue Lyrica 200mg  po q8hr (home dose) Methadone 5mg  change to q12h, to 2.5 mg for a few days PT active Fentanyl IV prn.  Summary - Improving hypoxia, doing some ATC, can transfer to LTAC if eligible  Cyril Mourning MD. FCCP. [facility] Pager [phone] If no response call [phone]

## 2015-03-06 NOTE — Progress Notes (Signed)
Speech Language Pathology Treatment: Adam Benson Speaking valve  Patient Details Name: Adam Benson MRN: [id number] DOB: [birth year] Today's Date: 03/06/2015 Time: 1000-1017 SLP Time Calculation (min) (ACUTE ONLY): 17 min  Assessment / Plan / Recommendation Clinical Impression  Pt now on trach collar, on ATC yesterday from 12 noon until 9:30 pm. SLP seeing pt to educate/explain FEES and RN reported pt wanted PMSV placed. He tolerated cuff deflation and placement of valve for approximately 3-4 minutes until SpO2 sats slowly dropped to 83%. Second trial with valve for 3 minutes resulted in same results. Although he is verbalizing around trach pt achieves greater mean length of utterance per breath. FEES will be performed at 11:30 without PMSV in place. PMSV with ST only.   HPI Other Pertinent Information: 54 yo male from [place] with dyspnea, wt gain, edema. Developed VDRF from PNA and ARDS, a fib with RVR. Trach placed on 9/20, pt has remained on ventilator.    Pertinent Vitals Pain Assessment: No/denies pain  SLP Plan  Continue with current plan of care (FEES)    Recommendations        Patient may use Passy-Muir Speech Valve: with SLP only PMSV Supervision: Full       Oral Care Recommendations: Oral care QID Follow up Recommendations:  (TBD) Plan: Continue with current plan of care (FEES)    GO     Adam Benson 03/06/2015, 10:37 AM   Adam Benson Adam Benson.Ed [employer] [phone]

## 2015-03-06 NOTE — Procedures (Signed)
  Objective Swallowing Evaluation:  (FEES)  Patient Details  Name: Adam Benson MRN: [id number] Date of Birth: [birth year]  Today's Date: 03/06/2015 Time: SLP Start Time (ACUTE ONLY): 1210-SLP Stop Time (ACUTE ONLY): 1230 SLP Time Calculation (min) (ACUTE ONLY): 20 min  Past Medical History:  Past Medical History  Diagnosis Date  . Gout   . HTN (hypertension), benign   . Chronic atrial fibrillation   . Hyperlipemia   . Nerve damage     right arm  . Obesity   . Insomnia   . GERD (gastroesophageal reflux disease)   . History of pneumonia 05/2014     two month hosp stay, vent, trach   . 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 Peptic ulcer 04/2014   Past Surgical History:  Past Surgical History  Procedure Laterality Date  . Arm surgery      right  . Appendectomy    . Knee surgery      right   HPI:  Other Pertinent Information: 54 yo male from [place] with dyspnea, wt gain, edema. Developed VDRF from PNA and ARDS, a fib with RVR. Trach placed on 9/20, pt has remained on ventilator. As of 10/6 pt on trach collar during the day. FEES recommended.   No Data Recorded  Assessment / Plan / Recommendation CHL IP CLINICAL IMPRESSIONS 03/06/2015  Therapy Diagnosis (None)  Clinical Impression FEES completed while pt on trach collar and without PMSV due to previous PMSV trial resulting in consistent desaturation. Pt exhibited mild pharyngeal dysphagia with decreased sensation leading to swallow initiation at the valleculae and pyriform sinuses. Trace silent laryngeal penetration with straw sips thin given increased velocity and volume prevented with cup sips thin. Trace-mild vallecular and pyriform sinus residue. Recommend regular texture and thin liquids, no straws, pills whole in applesauce and most likely meet nutritional needs without PANDA. ST will continue to follow.       CHL IP TREATMENT RECOMMENDATION 03/06/2015  Treatment Recommendations  Therapy as outlined in treatment plan below     CHL IP DIET RECOMMENDATION 03/06/2015  SLP Diet Recommendations Age appropriate regular solids;Thin  Liquid Administration via (None)  Medication Administration Whole meds with puree  Compensations Slow rate;Small sips/bites  Postural Changes and/or Swallow Maneuvers (None)     CHL IP OTHER RECOMMENDATIONS 03/06/2015  Recommended Consults (None)  Oral Care Recommendations Oral care QID  Other Recommendations (None)     CHL IP FOLLOW UP RECOMMENDATIONS 03/06/2015  Follow up Recommendations (No Data)     CHL IP FREQUENCY AND DURATION 03/06/2015  Speech Therapy Frequency (ACUTE ONLY) min 2x/week  Treatment Duration 2 weeks     Pertinent Vitals/Pain     SLP Swallow Goals No flowsheet data found.  No flowsheet data found.    CHL IP REASON FOR REFERRAL 03/06/2015  Reason for Referral Objectively evaluate swallowing function               No flowsheet data found.         Royce Macadamia 03/06/2015, 1:28 PM   Breck Coons Lonell Face.Ed [employer] [phone]

## 2015-03-06 NOTE — Progress Notes (Signed)
Speech Language Pathology  Patient Details Name: Adam Benson MRN: [id number] DOB: 01/03/1961 Today's Date: 03/06/2015 Time:  -       Will perform FEES swallow assessment at 11:30    Breck Coons Samantha Ragen M.Ed [employer] [phone]

## 2015-03-07 LAB — BASIC METABOLIC PANEL
Anion gap: 12 (ref 5–15)
BUN: 24 mg/dL — AB (ref 6–20)
CHLORIDE: 87 mmol/L — AB (ref 101–111)
CO2: 35 mmol/L — AB (ref 22–32)
CREATININE: 0.73 mg/dL (ref 0.61–1.24)
Calcium: 8.6 mg/dL — ABNORMAL LOW (ref 8.9–10.3)
GFR calc Af Amer: >60 mL/min (ref >60)
GFR calc non Af Amer: >60 mL/min (ref >60)
Glucose, Bld: 91 mg/dL (ref 65–99)
Potassium: 3.2 mmol/L — ABNORMAL LOW (ref 3.5–5.1)
Sodium: 134 mmol/L — ABNORMAL LOW (ref 135–145)

## 2015-03-07 LAB — CBC
HEMATOCRIT: 33.4 % — AB (ref 39.0–52.0)
Hemoglobin: 10.1 g/dL — ABNORMAL LOW (ref 13.0–17.0)
MCH: 23 pg — AB (ref 26.0–34.0)
MCHC: 30.2 g/dL (ref 30.0–36.0)
MCV: 75.9 fL — AB (ref 78.0–100.0)
PLATELETS: 142 10*3/uL — AB (ref 150–400)
RBC: 4.4 MIL/uL (ref 4.22–5.81)
RDW: 24.8 % — AB (ref 11.5–15.5)
WBC: 8.9 10*3/uL (ref 4.0–10.5)

## 2015-03-07 LAB — FUNGUS CULTURE W SMEAR
Fungal Smear: NONE SEEN
Special Requests: NORMAL

## 2015-03-07 LAB — GLUCOSE, CAPILLARY
GLUCOSE-CAPILLARY: 103 mg/dL — AB (ref 65–99)
Glucose-Capillary: 105 mg/dL — ABNORMAL HIGH (ref 65–99)
Glucose-Capillary: 113 mg/dL — ABNORMAL HIGH (ref 65–99)
Glucose-Capillary: 115 mg/dL — ABNORMAL HIGH (ref 65–99)
Glucose-Capillary: 92 mg/dL (ref 65–99)

## 2015-03-07 MED ORDER — DILTIAZEM 12 MG/ML ORAL SUSPENSION
45.0000 mg | Freq: Three times a day (TID) | ORAL | Status: DC
  Filled 2015-03-07 (×3): qty 6

## 2015-03-07 MED ORDER — DILTIAZEM HCL 30 MG PO TABS
45.0000 mg | ORAL_TABLET | Freq: Three times a day (TID) | ORAL | Status: DC
  Administered 2015-03-07 – 2015-03-08 (×3): 45 mg via ORAL
  Filled 2015-03-07 (×3): qty 2

## 2015-03-07 MED ORDER — INSULIN ASPART 100 UNIT/ML ~~LOC~~ SOLN
0.0000 [IU] | Freq: Three times a day (TID) | SUBCUTANEOUS | Status: DC
  Administered 2015-03-09: 2 [IU] via SUBCUTANEOUS
  Administered 2015-03-10 – 2015-03-11 (×2): 1 [IU] via SUBCUTANEOUS

## 2015-03-07 MED ORDER — POTASSIUM CHLORIDE 20 MEQ/15ML (10%) PO SOLN
40.0000 meq | ORAL | Status: AC
  Administered 2015-03-07 (×2): 40 meq
  Filled 2015-03-07 (×2): qty 30

## 2015-03-07 MED ORDER — FAMOTIDINE 20 MG PO TABS
20.0000 mg | ORAL_TABLET | Freq: Two times a day (BID) | ORAL | Status: DC
  Administered 2015-03-08 – 2015-03-11 (×7): 20 mg via ORAL
  Filled 2015-03-07 (×7): qty 1

## 2015-03-07 MED ORDER — INSULIN ASPART 100 UNIT/ML ~~LOC~~ SOLN
0.0000 [IU] | Freq: Every day | SUBCUTANEOUS | Status: DC
  Administered 2015-03-09: 2 [IU] via SUBCUTANEOUS

## 2015-03-07 MED ORDER — PREGABALIN 100 MG PO CAPS
200.0000 mg | ORAL_CAPSULE | Freq: Three times a day (TID) | ORAL | Status: DC
  Administered 2015-03-08 – 2015-03-11 (×10): 200 mg via ORAL
  Filled 2015-03-07 (×10): qty 2

## 2015-03-07 MED ORDER — ACETAMINOPHEN 325 MG PO TABS
650.0000 mg | ORAL_TABLET | ORAL | Status: DC | PRN
  Administered 2015-03-08: 650 mg via ORAL
  Filled 2015-03-07: qty 2

## 2015-03-07 NOTE — Progress Notes (Signed)
Placed vent on standby placed on 60% ATC patient tolerated well SATS 95%, RR 24, HR 86, will continue to monitor patient.

## 2015-03-07 NOTE — Progress Notes (Signed)
PULMONARY / CRITICAL CARE MEDICINE   Name: Adam Benson MRN: [id number] DOB: [birth year]    ADMISSION DATE:  02/01/2015 CONSULTATION DATE:  02/01/2015  REFERRING MD :  [facility]   CHIEF COMPLAINT:  Severe CAP  INITIAL PRESENTATION: 54 yo male from [place] with dyspnea, wt gain, edema.  Developed VDRF from PNA and ARDS, a fib with RVR.  STUDIES:  9/04 Echo > EF 50 to 55%  SIGNIFICANT EVENTS: 9/04 Intubated, transfer to [facility] 9/09 FOB, Fever 103 9/16 Repeat FOB 9/20 trach placed, fib rvr 9/21 leak, dislodged trach, changed to xtra long successful 9/24 moved to ICU and placed on PCV 10/2- to stepdown 10/3 - continues to talk around cuff, anxious to get trach out. 10/3- loss of volumes = trach change, bleeding 10/6 ATC x 6h , amio gtt for AF-RVR & hypotension  SUBJECTIVE: Looks good  VITAL SIGNS: Temp:  [97.4 F (36.3 C)-98.7 F (37.1 C)] 98.7 F (37.1 C) (10/08 1159) Pulse Rate:  [43-114] 99 (10/08 1400) Resp:  [18-32] 26 (10/08 1400) BP: (86-116)/(42-80) 105/68 mmHg (10/08 1400) SpO2:  [89 %-100 %] 98 % (10/08 1400) FiO2 (%):  [40 %-80 %] 40 % (10/08 1159)   VENTILATOR SETTINGS: Vent Mode:  [-] PCV FiO2 (%):  [40 %-80 %] 40 % Set Rate:  [16 bmp] 16 bmp PEEP:  [8 cmH20] 8 cmH20 Plateau Pressure:  [25 cmH20-26 cmH20] 26 cmH20   INTAKE / OUTPUT:  Intake/Output Summary (Last 24 hours) at 03/07/15 1505 Last data filed at 03/07/15 1400  Gross per 24 hour  Intake   3567 ml  Output   6975 ml  Net  -3408 ml    PHYSICAL EXAMINATION: General:  In bed, chr ill, , calm  Integument: No rash on exposed skin.  HEENT:  Trach clean, good phonation  Cardiovascular:  Regular rate s1 s2 . Pulmonary:   scattered rhonchi Abdomen: Soft. Normal bowel sounds. Nontender. Protuberant. Neurological:  Moving all 4 extremities equally.  Follows commands and alert , cooperative   CMP Latest Ref Rng 03/07/2015 03/06/2015 03/06/2015  Glucose 65 - 99 mg/dL 91 132(G) 401(U)  BUN 6 -  20 mg/dL 27(O) 53(G) 64(Q)  Creatinine 0.61 - 1.24 mg/dL 0.34 7.42 5.95  Sodium 135 - 145 mmol/L 134(L) 131(L) 134(L)  Potassium 3.5 - 5.1 mmol/L 3.2(L) 3.1(L) 3.8  Chloride 101 - 111 mmol/L 87(L) 87(L) 89(L)  CO2 22 - 32 mmol/L 35(H) 35(H) 35(H)  Calcium 8.9 - 10.3 mg/dL 6.3(O) 8.1(L) 8.4(L)  Total Protein 6.5 - 8.1 g/dL - - -  Total Bilirubin 0.3 - 1.2 mg/dL - - -  Alkaline Phos 38 - 126 U/L - - -  AST 15 - 41 U/L - - -  ALT 17 - 63 U/L - - -    CBC Latest Ref Rng 03/07/2015 03/05/2015 03/04/2015  WBC 4.0 - 10.5 K/uL 8.9 9.2 9.9  Hemoglobin 13.0 - 17.0 g/dL 10.1(L) 9.5(L) 9.1(L)  Hematocrit 39.0 - 52.0 % 33.4(L) 31.9(L) 31.4(L)  Platelets 150 - 400 K/uL 142(L) 162 174    CBG (last 3)   Recent Labs  03/07/15 0010 03/07/15 0441 03/07/15 0807  GLUCAP 113* 105* 92   ABG    Component Value Date/Time   PHART 7.466* 02/23/2015 0442   PCO2ART 55.6* 02/23/2015 0442   PO2ART 67.6* 02/23/2015 0442   HCO3 39.5* 02/23/2015 0442   TCO2 41.2 02/23/2015 0442   O2SAT 92.5 02/23/2015 0442     Dg Chest Port 1 View  03/06/2015  CLINICAL DATA:  ARDS.  Subsequent encounter.  EXAM: PORTABLE CHEST 1 VIEW  COMPARISON:  03/04/2015  FINDINGS: Bilateral ill-defined interstitial and hazy airspace opacities, predominantly in the lower lungs, are stable. No new lung abnormalities. No obvious pleural effusion and no pneumothorax evident  Cardiac silhouette is mildly enlarged.  Tracheostomy tube, enteric feeding tube and right PICC are stable.  IMPRESSION: 1. Persistent lung opacities without change from the prior exam. 2. Support apparatus is also stable, and well-positioned.   Electronically Signed   By: Amie Portland M.D.   On: 03/06/2015 07:19   Dg Abd Portable 1v  03/06/2015   CLINICAL DATA:  Feeding tube placement  EXAM: PORTABLE ABDOMEN - 1 VIEW  COMPARISON:  February 24, 2015  FINDINGS: Feeding tube tip is in the stomach. Bowel gas pattern is unremarkable. No demonstrable obstruction or free  air.  IMPRESSION: Feeding tube tip in stomach.  Bowel gas pattern unremarkable.   Electronically Signed   By: Bretta Bang III M.D.   On: 03/06/2015 07:58    LINES/TUBES: ETT 9/4 >> 9/20 R IJ CVL 9/4 >> 9/9 L IJ CVL 9/9 >> 9/24 picc 9/24>> Trach (DF) 9/20 >>  CULTURES: C diff 9/13 >> Antigen positive, toxin negative >> colonization Blood 9/22 >> negative Sputum 9/23 >> few C albicans Urine 9/23 >> multiple species  ASSESSMENT / PLAN:  Trach change to XLT 9/21>> A: Acute on chronic hypoxic respiratory failure - Pneumonia. Improving with diuresis Ventilator Dependent Respiratory Failure - s/p tracheostomy.  H/O Asthma. 10/4 low volumes, trach changed, bleeding -->resolved Plan: ATC daytime, noct vent , goal 24h ATC if can tolerate fio2 titrate to sat 90%   PICC 9/24>>  A fib with RVR - CHA2DS2-VASc score 1. Acute Diastolic CHF - resolved. Plan Diltiazem to 45mg  via tube to q8 h ASA 325mg  daily. Monitor on tele. 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 Hypernatremia - resolved. Hypokalemia - Resolved. Plan: replte lytes as needed Maintain lasix, to 20 mg as BP permits   Diarrhea w/ flexiseal /CDiff colonization. >passed swallow eval. Poor diet intake-->doesn't feel hungry Plan: Continuing Tube Feeds. Pepcid via tube q12hr. Hold off peg as may go to Trach collar   Anemia - improving. Secondary to cricital illness. No signs of active bleeding. VTE prophylaxis. Bleeding trach post change 10/3 Plan: Transfuse per usual guidelines. SCDs. CBC in AM. Restart sub q hep, no bleeding further   Severe CAP - No organism identified; s/p treatment with vancomycin & Merrem. Septic shock - resolved. C diff colonization. Plan: No fevers   Hyperglycemia - resolved Low glu 85 Plan: Continuing SSI. Change to [facility] and HS Hold lantus     Delirium -  resolved. Chronic Pain - on Methadone. Plan: Continue Lyrica 200mg  po q8hr (home dose) Methadone 5mg  change to q12h, to 2.5 mg for a few days PT active Fentanyl IV prn.  Summary - Improving hypoxia, doing some ATC, can transfer to LTAC if eligible; but if he continues to improve then may not need this.   Simonne Martinet ACNP-BC [facility] Pulmonary/Critical Care Pager # [phone] OR # [phone] if no answer    STAFF NOTE: I, Rory Percy, MD FACP have personally reviewed patient's available data, including medical history, events of note, physical examination and test results as part of my evaluation. I have discussed with resident/NP and other care  providers such as pharmacist, RN and RRT. In addition, I personally evaluated patient and elicited key findings of: no distress on TC still, secretions remain present, no PMV, although he phontaes easily on is own, cuff down ok , will shoot for 24 hr trach collar, follow secretion status, what has improved him is lasix to neg balance all week long, renal fxn has continued to tolerate well, maintain, eating with trach well without dysphagia  [name]. Tyson Alias, MD, FACP Pgr: [phone] [place] [facility] 03/07/2015 5:09 PM

## 2015-03-07 NOTE — Progress Notes (Signed)
Vent on standby patient on 28% ATC 

## 2015-03-07 NOTE — Progress Notes (Signed)
Vent on standby patient on 40% ATC tolerated well SATS 96%

## 2015-03-07 NOTE — Progress Notes (Signed)
eLink Physician-Brief Progress Note Patient Name: Adam Benson DOB: May 30, 1961 MRN: [id number]   Date of Service  03/07/2015  HPI/Events of Note  Hypokalemia  eICU Interventions  Potassium replaced     Intervention Category Intermediate Interventions: Electrolyte abnormality - evaluation and management  Adam Benson 03/07/2015, 6:48 AM

## 2015-03-08 LAB — COMPREHENSIVE METABOLIC PANEL
ALBUMIN: 2.1 g/dL — AB (ref 3.5–5.0)
ALK PHOS: 46 U/L (ref 38–126)
ALT: 34 U/L (ref 17–63)
ANION GAP: 11 (ref 5–15)
AST: 25 U/L (ref 15–41)
BILIRUBIN TOTAL: 0.4 mg/dL (ref 0.3–1.2)
BUN: 15 mg/dL (ref 6–20)
CALCIUM: 8.4 mg/dL — AB (ref 8.9–10.3)
CO2: 34 mmol/L — AB (ref 22–32)
CREATININE: 0.68 mg/dL (ref 0.61–1.24)
Chloride: 92 mmol/L — ABNORMAL LOW (ref 101–111)
GFR calc Af Amer: >60 mL/min (ref >60)
GFR calc non Af Amer: >60 mL/min (ref >60)
GLUCOSE: 94 mg/dL (ref 65–99)
Potassium: 3.6 mmol/L (ref 3.5–5.1)
Sodium: 137 mmol/L (ref 135–145)
TOTAL PROTEIN: 4.8 g/dL — AB (ref 6.5–8.1)

## 2015-03-08 LAB — GLUCOSE, CAPILLARY
GLUCOSE-CAPILLARY: 90 mg/dL (ref 65–99)
Glucose-Capillary: 108 mg/dL — ABNORMAL HIGH (ref 65–99)
Glucose-Capillary: 116 mg/dL — ABNORMAL HIGH (ref 65–99)
Glucose-Capillary: 123 mg/dL — ABNORMAL HIGH (ref 65–99)
Glucose-Capillary: 91 mg/dL (ref 65–99)

## 2015-03-08 LAB — CBC
HEMATOCRIT: 34.9 % — AB (ref 39.0–52.0)
HEMOGLOBIN: 10.7 g/dL — AB (ref 13.0–17.0)
MCH: 23.4 pg — ABNORMAL LOW (ref 26.0–34.0)
MCHC: 30.7 g/dL (ref 30.0–36.0)
MCV: 76.2 fL — ABNORMAL LOW (ref 78.0–100.0)
Platelets: 141 10*3/uL — ABNORMAL LOW (ref 150–400)
RBC: 4.58 MIL/uL (ref 4.22–5.81)
RDW: 24.9 % — ABNORMAL HIGH (ref 11.5–15.5)
WBC: 6.8 10*3/uL (ref 4.0–10.5)

## 2015-03-08 MED ORDER — DILTIAZEM HCL 60 MG PO TABS: 60.0000 mg | ORAL_TABLET | Freq: Three times a day (TID) | ORAL | Status: DC

## 2015-03-08 MED ORDER — DILTIAZEM HCL 100 MG IV SOLR
5.0000 mg/h | INTRAVENOUS | Status: AC
  Administered 2015-03-08: 5 mg/h via INTRAVENOUS
  Administered 2015-03-08: 10 mg/h via INTRAVENOUS
  Filled 2015-03-08 (×2): qty 100

## 2015-03-08 MED ORDER — DILTIAZEM LOAD VIA INFUSION
20.0000 mg | Freq: Once | INTRAVENOUS | Status: AC
  Administered 2015-03-08: 20 mg via INTRAVENOUS
  Filled 2015-03-08: qty 20

## 2015-03-08 NOTE — Progress Notes (Signed)
Vent on standby patient on 35% ATC tolerating well SATS 93%

## 2015-03-08 NOTE — Progress Notes (Signed)
Vent on standby patient on 35% ATC SATS 95%

## 2015-03-08 NOTE — Progress Notes (Signed)
PULMONARY / CRITICAL CARE MEDICINE   Name: Adam Benson MRN: [id number] DOB: Sep 05, 1960    ADMISSION DATE:  02/01/2015 CONSULTATION DATE:  02/01/2015  REFERRING MD :  [facility]   CHIEF COMPLAINT:  Severe CAP  INITIAL PRESENTATION: 54 yo male from [place] with dyspnea, wt gain, edema.  Developed VDRF from PNA and ARDS, a fib with RVR.  STUDIES:  9/04 Echo > EF 50 to 55%  SIGNIFICANT EVENTS: 9/04 Intubated, transfer to [facility] 9/09 FOB, Fever 103 9/16 Repeat FOB 9/20 trach placed, fib rvr 9/21 leak, dislodged trach, changed to xtra long successful 9/24 moved to ICU and placed on PCV 10/2- to stepdown 10/3 - continues to talk around cuff, anxious to get trach out. 10/3- loss of volumes = trach change, bleeding 10/6 ATC x 6h , amio gtt for AF-RVR & hypotension  SUBJECTIVE: Looks good  Remains off vent  With trach collar greater 24 hr  VITAL SIGNS: Temp:  [97.9 F (36.6 C)-101.5 F (38.6 C)] 101.5 F (38.6 C) (10/09 1223) Pulse Rate:  [58-129] 117 (10/09 1223) Resp:  [18-28] 22 (10/09 1223) BP: (92-121)/(50-75) 116/50 mmHg (10/09 1223) SpO2:  [87 %-99 %] 96 % (10/09 1223) FiO2 (%):  [35 %-40 %] 35 % (10/09 1215) Weight:  [123.9 kg (273 lb 2.4 oz)] 123.9 kg (273 lb 2.4 oz) (10/09 0500)   VENTILATOR SETTINGS: Vent Mode:  [-]  FiO2 (%):  [35 %-40 %] 35 %   INTAKE / OUTPUT:  Intake/Output Summary (Last 24 hours) at 03/08/15 1343 Last data filed at 03/08/15 0900  Gross per 24 hour  Intake   1675 ml  Output   6050 ml  Net  -4375 ml    PHYSICAL EXAMINATION: General:  In bed, chr ill, , calm no distress in good spirits Integument: No rash on exposed skin.  HEENT:  Trach clean, good phonation  Cardiovascular:  Regular rate s1 s2 . Pulmonary:   scattered rhonchi-->improved Abdomen: Soft. Normal bowel sounds. Nontender. Protuberant. Neurological:  Moving all 4 extremities equally.  Follows commands and alert , cooperative    CMP Latest Ref Rng 03/08/2015  03/07/2015 03/06/2015  Glucose 65 - 99 mg/dL 94 91 794(I)  BUN 6 - 20 mg/dL 15 01(K) 55(V)  Creatinine 0.61 - 1.24 mg/dL 7.48 2.70 7.86  Sodium 135 - 145 mmol/L 137 134(L) 131(L)  Potassium 3.5 - 5.1 mmol/L 3.6 3.2(L) 3.1(L)  Chloride 101 - 111 mmol/L 92(L) 87(L) 87(L)  CO2 22 - 32 mmol/L 34(H) 35(H) 35(H)  Calcium 8.9 - 10.3 mg/dL 7.5(Q) 4.9(E) 8.1(L)  Total Protein 6.5 - 8.1 g/dL 4.8(L) - -  Total Bilirubin 0.3 - 1.2 mg/dL 0.4 - -  Alkaline Phos 38 - 126 U/L 46 - -  AST 15 - 41 U/L 25 - -  ALT 17 - 63 U/L 34 - -    CBC Latest Ref Rng 03/08/2015 03/07/2015 03/05/2015  WBC 4.0 - 10.5 K/uL 6.8 8.9 9.2  Hemoglobin 13.0 - 17.0 g/dL 10.7(L) 10.1(L) 9.5(L)  Hematocrit 39.0 - 52.0 % 34.9(L) 33.4(L) 31.9(L)  Platelets 150 - 400 K/uL 141(L) 142(L) 162    CBG (last 3)   Recent Labs  03/08/15 0020 03/08/15 0345 03/08/15 0854  GLUCAP 123* 91 90   ABG    No results found.  LINES/TUBES: ETT 9/4 >> 9/20 R IJ CVL 9/4 >> 9/9 L IJ CVL 9/9 >> 9/24 picc 9/24>> Trach (DF) 9/20 >>  CULTURES: C diff 9/13 >> Antigen positive, toxin negative >> colonization Blood  9/22 >> negative Sputum 9/23 >> few C albicans Urine 9/23 >> multiple species  ASSESSMENT / PLAN:  Trach change to XLT 9/21>> A: Acute on chronic hypoxic respiratory failure - Pneumonia. Improving with diuresis Ventilator Dependent Respiratory Failure - s/p tracheostomy.  H/O Asthma. 10/4 low volumes, trach changed, bleeding -->resolved >on ATC 24/7 Plan: Dc vent  fio2 titrate to sat 90% Cont PMV as able, has not had success  PICC 9/24>>  A fib with RVR - CHA2DS2-VASc score 1. Acute Diastolic CHF - resolved. Plan Diltiazem to  via tube to q8 h ASA  daily. Monitor on tele. 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 Hypernatremia - resolved. Hypokalemia - Resolved. Plan: replete lytes as  needed Maintain lasix, to 20 mg as BP permits   Diarrhea w/ flexiseal /CDiff colonization. >passed swallow eval. Poor diet intake-->doesn't feel hungry Plan: Continuing Tube Feeds. Pepcid via tube q12hr. Hold off peg as may go to Trach collar   Anemia - improving. Secondary to cricital illness. No signs of active bleeding. VTE prophylaxis. Bleeding trach post change 10/3 Plan: Transfuse per usual guidelines. SCDs. CBC in AM. Restarted sub q hep, no bleeding further   Severe CAP - No organism identified; s/p treatment with vancomycin & Merrem. Septic shock - resolved. C diff colonization. Plan: Trend fever and WBC curve    Hyperglycemia - resolved Low glu 85 Plan: Continuing SSI. Changed to [facility] and HS Hold lantus     Delirium - resolved. Chronic Pain - on Methadone. Plan: Continue Lyrica  po q8hr (home dose) Methadone  change to q12h, to 2.5 mg for a few days PT active Fentanyl IV prn.  Summary - Improving hypoxia, doing some ATC, can transfer to LTAC if eligible; but if he continues to improve then may not need this.   Simonne Martinet ACNP-BC [facility] Pulmonary/Critical Care Pager # [phone] OR # [phone] if no answer  STAFF NOTE: I, Rory Percy, MD FACP have personally reviewed patient's available data, including medical history, events of note, physical examination and test results as part of my evaluation. I have discussed with resident/NP and other care providers such as pharmacist, RN and RRT. In addition, I personally evaluated patient and elicited key findings of: he looks amazingly good, rr 12 on TC now over 24 hr, dc vent from room, he continues to diuresis now 5.6 lit neg last 24 hr, amazing now he must be close to 20 liters negative, will add it up, ciff can go down as has a strong cough, secretions are better , can re attempt PMV, short time with direct observation, he has a 6 trach so should not be an issue of trach size his prior failure  with PMV, he will ikley de cannulate end of week  [name]. Tyson Alias, MD, FACP Pgr: [phone] [place] [facility] 03/08/2015 3:06 PM

## 2015-03-08 NOTE — Progress Notes (Signed)
RN notified PCCM that pt had a 2.16 sec pause at approximately 0126. Vital signs : bp 108/55 (68) hr 95. rr 13, O2 93. Pt is asymptomatic. Will continue to monitor.

## 2015-03-08 NOTE — Progress Notes (Signed)
eLink Physician-Brief Progress Note Patient Name: Adam Benson DOB: 07/19/1960 MRN: [id number]   Date of Service  03/08/2015  HPI/Events of Note  afib with rvr.  Sys bp greater than 120.  HR as high as 170.  Patient w/o symptoms  eICU Interventions  diltiazem drip     Intervention Category Intermediate Interventions: Arrhythmia - evaluation and management  KAIVEN, NEASON, P 03/08/2015, 4:29 PM

## 2015-03-09 LAB — GLUCOSE, CAPILLARY
GLUCOSE-CAPILLARY: 107 mg/dL — AB (ref 65–99)
GLUCOSE-CAPILLARY: 109 mg/dL — AB (ref 65–99)
GLUCOSE-CAPILLARY: 87 mg/dL (ref 65–99)
Glucose-Capillary: 110 mg/dL — ABNORMAL HIGH (ref 65–99)
Glucose-Capillary: 159 mg/dL — ABNORMAL HIGH (ref 65–99)
Glucose-Capillary: 231 mg/dL — ABNORMAL HIGH (ref 65–99)

## 2015-03-09 MED ORDER — METHYLPREDNISOLONE SODIUM SUCC 40 MG IJ SOLR
20.0000 mg | Freq: Two times a day (BID) | INTRAMUSCULAR | Status: DC
  Administered 2015-03-09 – 2015-03-10 (×2): 20 mg via INTRAVENOUS
  Filled 2015-03-09 (×3): qty 1

## 2015-03-09 MED ORDER — FENTANYL 25 MCG/HR TD PT72
25.0000 ug | MEDICATED_PATCH | TRANSDERMAL | Status: DC
  Administered 2015-03-09 – 2015-03-11 (×2): 25 ug via TRANSDERMAL
  Filled 2015-03-09 (×3): qty 1

## 2015-03-09 MED ORDER — FENTANYL CITRATE (PF) 100 MCG/2ML IJ SOLN
25.0000 ug | INTRAMUSCULAR | Status: DC | PRN
  Administered 2015-03-09 – 2015-03-11 (×11): 50 ug via INTRAVENOUS
  Filled 2015-03-09 (×11): qty 2

## 2015-03-09 MED ORDER — METHADONE HCL 5 MG PO TABS
2.5000 mg | ORAL_TABLET | Freq: Every day | ORAL | Status: DC
  Administered 2015-03-10: 2.5 mg via ORAL
  Filled 2015-03-09: qty 1

## 2015-03-09 MED ORDER — POTASSIUM CHLORIDE CRYS ER 20 MEQ PO TBCR
40.0000 meq | EXTENDED_RELEASE_TABLET | Freq: Once | ORAL | Status: AC
  Administered 2015-03-09: 40 meq via ORAL
  Filled 2015-03-09: qty 2

## 2015-03-09 MED ORDER — DILTIAZEM HCL 60 MG PO TABS
60.0000 mg | ORAL_TABLET | Freq: Three times a day (TID) | ORAL | Status: DC
  Administered 2015-03-09 – 2015-03-10 (×4): 60 mg via ORAL
  Filled 2015-03-09 (×4): qty 1

## 2015-03-09 NOTE — Progress Notes (Signed)
PULMONARY / CRITICAL CARE MEDICINE   Name: Adam Benson MRN: [id number] DOB: 1960/11/28    ADMISSION DATE:  02/01/2015 CONSULTATION DATE:  02/01/2015  REFERRING MD :  [facility]   CHIEF COMPLAINT:  Severe CAP  INITIAL PRESENTATION: 54 yo male from [place] with dyspnea, wt gain, edema.  Developed VDRF from PNA and ARDS, a fib with RVR.  STUDIES:  9/04 Echo > EF 50 to 55%  SIGNIFICANT EVENTS: 9/04 Intubated, transfer to [facility] 9/09 FOB, Fever 103 9/16 Repeat FOB 9/20 trach placed, fib rvr 9/21 leak, dislodged trach, changed to xtra long successful 9/24 moved to ICU and placed on PCV 10/2- to stepdown 10/3 - continues to talk around cuff, anxious to get trach out. 10/3- loss of volumes = trach change, bleeding 10/6 ATC x 6h , amio gtt for AF-RVR & hypotension  SUBJECTIVE: Looks good PMV attempted  VITAL SIGNS: Temp:  [97.8 F (36.6 C)-100.6 F (38.1 C)] 97.8 F (36.6 C) (10/10 1200) Pulse Rate:  [63-126] 89 (10/10 1211) Resp:  [18-33] 20 (10/10 1211) BP: (100-121)/(53-80) 100/60 mmHg (10/10 1200) SpO2:  [88 %-99 %] 92 % (10/10 1211) FiO2 (%):  [35 %-60 %] 60 % (10/10 1211)   VENTILATOR SETTINGS: Vent Mode:  [-]  FiO2 (%):  [35 %-60 %] 60 %   INTAKE / OUTPUT:  Intake/Output Summary (Last 24 hours) at 03/09/15 1432 Last data filed at 03/09/15 1021  Gross per 24 hour  Intake    885 ml  Output   4650 ml  Net  -3765 ml    PHYSICAL EXAMINATION: General:  In bed, chr ill, , calm no distress, slight increase O2 from prior Integument: No rash on exposed skin.  HEENT:  Trach clean, good phonation  Cardiovascular:  Regular rate s1 s2 . Pulmonary:  CTA Abdomen: Soft. Normal bowel sounds. Nontender. Protuberant. Neurological:  Moving all 4 extremities equally.  Follows commands and alert , cooperative    CMP Latest Ref Rng 03/08/2015 03/07/2015 03/06/2015  Glucose 65 - 99 mg/dL 94 91 409(W)  BUN 6 - 20 mg/dL 15 11(B) 14(N)  Creatinine 0.61 - 1.24 mg/dL 8.29 5.62  1.30  Sodium 135 - 145 mmol/L 137 134(L) 131(L)  Potassium 3.5 - 5.1 mmol/L 3.6 3.2(L) 3.1(L)  Chloride 101 - 111 mmol/L 92(L) 87(L) 87(L)  CO2 22 - 32 mmol/L 34(H) 35(H) 35(H)  Calcium 8.9 - 10.3 mg/dL 8.6(V) 7.8(I) 8.1(L)  Total Protein 6.5 - 8.1 g/dL 4.8(L) - -  Total Bilirubin 0.3 - 1.2 mg/dL 0.4 - -  Alkaline Phos 38 - 126 U/L 46 - -  AST 15 - 41 U/L 25 - -  ALT 17 - 63 U/L 34 - -    CBC Latest Ref Rng 03/08/2015 03/07/2015 03/05/2015  WBC 4.0 - 10.5 K/uL 6.8 8.9 9.2  Hemoglobin 13.0 - 17.0 g/dL 10.7(L) 10.1(L) 9.5(L)  Hematocrit 39.0 - 52.0 % 34.9(L) 33.4(L) 31.9(L)  Platelets 150 - 400 K/uL 141(L) 142(L) 162    CBG (last 3)   Recent Labs  03/09/15 0433 03/09/15 0843 03/09/15 1230  GLUCAP 110* 109* 87   ABG    No results found.  LINES/TUBES: ETT 9/4 >> 9/20 R IJ CVL 9/4 >> 9/9 L IJ CVL 9/9 >> 9/24 picc 9/24>> Trach (DF) 9/20 >>  CULTURES: C diff 9/13 >> Antigen positive, toxin negative >> colonization Blood 9/22 >> negative Sputum 9/23 >> few C albicans Urine 9/23 >> multiple species  ASSESSMENT / PLAN:  Trach change to XLT 9/21>>  A: Acute on chronic hypoxic respiratory failure - Pneumonia. Improving with diuresis Ventilator Dependent Respiratory Failure - s/p tracheostomy.  H/O Asthma. 10/4 low volumes, trach changed, bleeding -->resolved >on ATC 24/7 Plan: fio2 titrate to sat 90% Cont PMV per SLP direct observation He speaks around cuff at baseline well likely can consider decannulation Friday if he progresses He again is neg 4 liters  PICC 9/24>>  A fib with RVR - CHA2DS2-VASc score 1. Acute Diastolic CHF - resolved. Plan Diltiazem to 60mg  via tube to q8 h ASA 325mg  daily. Monitor on tele. Lasix 20 q 12h, continued Avoiding amio  Acute on Chronic Renal Failure (CKD stage III) - Resolved. Hypernatremia - resolved. Hypokalemia - Resolved. Plan: k supp Maintain lasix, to 20 mg continued  Diarrhea w/ flexiseal /CDiff  colonization. >passed swallow eval. Intake good Plan: Pepcid via tube q12hr. Diet on vent ok by mouth  Anemia - improving. Secondary to cricital illness. No signs of active bleeding. VTE prophylaxis. Bleeding trach post change 10/3- resolved Plan: Dc sub q hep as full ambulation noted  Severe CAP - No organism identified; s/p treatment with vancomycin & Merrem. Septic shock - resolved. C diff colonization. Plan: Trend fever None noted Follow secretions  Low glu 85 Plan: Continuing SSI.  Delirium - resolved. Chronic Pain - on Methadone. Plan: Continue Lyrica 200mg  po q8hr (home dose) Methadone to daily x 2 days then dc PT active Fentanyl IV prn. woalking well  [name]. Tyson Alias, MD, FACP Pgr: [phone] [place] [facility]

## 2015-03-09 NOTE — Progress Notes (Addendum)
CSW informed pt is currently on trach collar and has been working with [facility] for home health  CSW signing off- please reconsult if needed  Merlyn Lot, [facility] Clinical Social Worker [phone]

## 2015-03-09 NOTE — Progress Notes (Signed)
Speech Language Pathology Treatment: Dysphagia;Passy Muir Speaking valve  Patient Details Name: Adam Benson MRN: [id number] DOB: Jan 14, 1961 Today's Date: 03/09/2015 Time: 1200-1224 SLP Time Calculation (min) (ACUTE ONLY): 24 min  Assessment / Plan / Recommendation Clinical Impression  ST follow up for PMSV therapy.  When ST entered the room the patient was found sitting upright in his bedside chair with the cuff deflated.  He had PT at 9:00am this morning with a drop on O2 levels during mobility so O2 support was increased to FiO2 of 60%.  Per nursing, the patient was otherwise doing well.  Prior to PMSV placement, initial O2 reading was at 97%.  Finger occlusion was attempted with mild back pressure noted.  PMSV was placed and removed at the 1 minute mark to check for air trapping.  None was noted.  However, the patient had slow decline in O2 levels.  Even give cue to take slow deep breaths the patient was unable to maintain O2 levels above 90 and the PMSV was removed after approximately 4 minutes.  Recommend continued placement of PMSV with SLP ONLY!  ST will continue to follow.    HPI Other Pertinent Information: 54 yo male from [place] with dyspnea, wt gain, edema. Developed VDRF from PNA and ARDS, a fib with RVR. Trach placed on 9/20, pt has remained on ventilator. As of 10/6 pt on trach collar during the day. FEES recommended.   Currently has trach and is on trach collar.     Pertinent Vitals Pain Assessment: 0-10 Pain Score: 9  Pain Location: hands Pain Descriptors / Indicators: Aching;Burning Pain Intervention(s):  (RN notified)  SLP Plan  Continue with current plan of care    Recommendations Diet recommendations: Regular;Thin liquid Liquids provided via: Cup;No straw Medication Administration: Whole meds with puree Supervision: Patient able to self feed Compensations: Slow rate;Small sips/bites      Patient may use Passy-Muir Speech Valve: with SLP only PMSV Supervision: Full        Oral Care Recommendations: Oral care QID Follow up Recommendations: Inpatient Rehab Plan: Continue with current plan of care    GO    Dimas Aguas, MA, CCC-SLP Acute Rehab SLP [phone]  Fleet Contras 03/09/2015, 12:31 PM

## 2015-03-09 NOTE — Progress Notes (Signed)
RT Note: Patient de saturated while in physical therapy. PT increased his trach collar to 60%. When RT arrived he was saturatin 87% on that. He was suctioned for a small amount of white secretions. We were able to lower his Fio2 to 50% after suctioning and he is maintaining well on that with an Spo2-92%. Unit RT will continue to monitor and wean O2 as tolerated.

## 2015-03-09 NOTE — Progress Notes (Signed)
Physical Therapy Treatment Patient Details Name: Adam Benson MRN: [id number] DOB: July 16, 1960 Today's Date: 03/09/2015    History of Present Illness Pt is a 54 y/o male who was transferred from [facility] with dyspnea, weight gain and LE edema. He failed BiPap, was intubated 9/4 and transferred to [facility] trach placed 9/20. He was found to have CAP with ARDS. ICU couse has been complicated by a-fib with RVR and bradycardia.     PT Comments    Pt with excellent progress with mobility today. Able to transfer to EOB without physical assist and initiated gait but limited by desaturation with exertion with seated rest to recover sats. Pt on 40% trach collar on arrival with sats 90%, transfer to EOB sats 88%, increased O2 to 55% for gait with continued drop in sats with good recovery with seated rest and RT present to assess. Will continue to follow with increased frequency to maximize gait and function.   Follow Up Recommendations  Supervision/Assistance - 24 hour;CIR     Equipment Recommendations  Rolling walker with 5" wheels;Wheelchair (measurements PT);Wheelchair cushion (measurements PT)    Recommendations for Other Services Rehab consult     Precautions / Restrictions Precautions Precautions: Fall Precaution Comments: trach, watch sats Restrictions Weight Bearing Restrictions: No    Mobility  Bed Mobility Overal bed mobility: Needs Assistance       Supine to sit: Supervision     General bed mobility comments: supervision for lines, pt able to transfer to EOB with rail without physical assist  Transfers     Transfers: Sit to/from Stand Sit to Stand: Min guard         General transfer comment: cues for hand placement and safety  Ambulation/Gait Ambulation/Gait assistance: Min assist Ambulation Distance (Feet): 10 Feet Assistive device: Rolling walker (2 wheeled) Gait Pattern/deviations: Step-through pattern;Decreased stride length;Trunk flexed   Gait  velocity interpretation: Below normal speed for age/gender General Gait Details: cues for posture and position in RW. pt with sats 77% on 55% venturi with limited gait and HR up to 158. Pt immediately sat with recovery to 88% with seated rest and HR down to 110. RT present in room end of session to monitor and assist pt   Stairs            Wheelchair Mobility    Modified Rankin (Stroke Patients Only)       Balance     Sitting balance-Leahy Scale: Good       Standing balance-Leahy Scale: Poor                      Cognition Arousal/Alertness: Awake/alert Behavior During Therapy: WFL for tasks assessed/performed Overall Cognitive Status: Within Functional Limits for tasks assessed                      Exercises General Exercises - Lower Extremity Long Arc Quad: AROM;Seated;Both;15 reps Hip Flexion/Marching: AROM;Seated;Both;15 reps    General Comments        Pertinent Vitals/Pain Pain Assessment: 0-10 Pain Score: 7  Pain Location: bil hands Pain Descriptors / Indicators: Aching;Burning Pain Intervention(s): Premedicated before session;Repositioned    Home Living                      Prior Function            PT Goals (current goals can now be found in the care plan section) Progress towards PT goals: Progressing toward goals  Frequency  Min 3X/week    PT Plan Current plan remains appropriate;Frequency needs to be updated    Co-evaluation             End of Session Equipment Utilized During Treatment: Gait belt;Oxygen Activity Tolerance: Patient tolerated treatment well Patient left: in chair;with call bell/phone within reach     Time: 0825-0855 PT Time Calculation (min) (ACUTE ONLY): 30 min  Charges:  $Therapeutic Exercise: 8-22 mins $Therapeutic Activity: 8-22 mins                    G Codes:      Delorse Lek 03-18-15, 11:25 AM [name], PT [phone]

## 2015-03-09 NOTE — Progress Notes (Signed)
Speech Language Pathology Treatment: Dysphagia;Passy Muir Speaking valve  Patient Details Name: Adam Benson MRN: [id number] DOB: 10-14-1960 Today's Date: 03/09/2015 Time: 1200-1224 SLP Time Calculation (min) (ACUTE ONLY): 24 min  Assessment / Plan / Recommendation Clinical Impression  The patient was seen for therapeutic diet tolerance.  PMSV was in place for part of the meal observation but had to be removed.  He independently recalled his safe swallow precautions.  Per chart review, the patient's lungs are clear but diminished and intake is good.  He has been running intermittent low grade temps.  He appeared to tolerate the recommended diet well.  ST will continue to follow.     HPI Other Pertinent Information: 54 yo male from [place] with dyspnea, wt gain, edema. Developed VDRF from PNA and ARDS, a fib with RVR. Trach placed on 9/20, pt has remained on ventilator. As of 10/6 pt on trach collar during the day. FEES recommended.   Currently has trach and is on trach collar.     Pertinent Vitals Pain Assessment: 0-10 Pain Score: 9  Pain Location: hands Pain Descriptors / Indicators: Aching;Burning Pain Intervention(s):  (RN notified)  SLP Plan  Continue with current plan of care    Recommendations Diet recommendations: Regular;Thin liquid Liquids provided via: Cup;No straw Medication Administration: Whole meds with puree Supervision: Patient able to self feed Compensations: Slow rate;Small sips/bites      Patient may use Passy-Muir Speech Valve: with SLP only PMSV Supervision: Full       Oral Care Recommendations: Oral care QID Follow up Recommendations: Inpatient Rehab Plan: Continue with current plan of care    GO    Dimas Aguas, MA, CCC-SLP Acute Rehab SLP [phone]  Fleet Contras 03/09/2015, 12:29 PM

## 2015-03-10 DIAGNOSIS — I4891 Unspecified atrial fibrillation: Secondary | ICD-10-CM

## 2015-03-10 DIAGNOSIS — Z5189 Encounter for other specified aftercare: Secondary | ICD-10-CM

## 2015-03-10 DIAGNOSIS — R5381 Other malaise: Secondary | ICD-10-CM

## 2015-03-10 LAB — BASIC METABOLIC PANEL
Anion gap: 9 (ref 5–15)
BUN: 15 mg/dL (ref 6–20)
CHLORIDE: 98 mmol/L — AB (ref 101–111)
CO2: 31 mmol/L (ref 22–32)
Calcium: 8.6 mg/dL — ABNORMAL LOW (ref 8.9–10.3)
Creatinine, Ser: 0.77 mg/dL (ref 0.61–1.24)
GFR calc non Af Amer: >60 mL/min (ref >60)
Glucose, Bld: 118 mg/dL — ABNORMAL HIGH (ref 65–99)
POTASSIUM: 3.9 mmol/L (ref 3.5–5.1)
SODIUM: 138 mmol/L (ref 135–145)

## 2015-03-10 LAB — GLUCOSE, CAPILLARY
GLUCOSE-CAPILLARY: 126 mg/dL — AB (ref 65–99)
GLUCOSE-CAPILLARY: 115 mg/dL — AB (ref 65–99)
GLUCOSE-CAPILLARY: 152 mg/dL — AB (ref 65–99)
Glucose-Capillary: 142 mg/dL — ABNORMAL HIGH (ref 65–99)

## 2015-03-10 LAB — MRSA PCR SCREENING: MRSA BY PCR: NEGATIVE

## 2015-03-10 MED ORDER — FUROSEMIDE 40 MG PO TABS
40.0000 mg | ORAL_TABLET | Freq: Every day | ORAL | Status: DC
  Administered 2015-03-10 – 2015-03-11 (×2): 40 mg via ORAL
  Filled 2015-03-10 (×2): qty 1

## 2015-03-10 MED ORDER — DILTIAZEM HCL 30 MG PO TABS
90.0000 mg | ORAL_TABLET | Freq: Three times a day (TID) | ORAL | Status: DC
  Administered 2015-03-10 – 2015-03-11 (×3): 90 mg via ORAL
  Filled 2015-03-10 (×6): qty 1

## 2015-03-10 MED ORDER — PREDNISONE 20 MG PO TABS
20.0000 mg | ORAL_TABLET | Freq: Every day | ORAL | Status: DC
  Administered 2015-03-11: 20 mg via ORAL
  Filled 2015-03-10: qty 1

## 2015-03-10 NOTE — Progress Notes (Signed)
eLink Physician-Brief Progress Note Patient Name: Adam Benson DOB: [birth year] MRN: [id number]   Date of Service  03/10/2015  HPI/Events of Note  Transient bradycardia episodes to 30s with desats while sleeping. BP is stable. Cardizem dose up titrated today.  eICU Interventions  Atropine at bedside. If it continues to be issue consider backing down on cardizem.      Intervention Category Intermediate Interventions: Arrhythmia - evaluation and management  Takeyah Wieman 03/10/2015, 10:47 PM

## 2015-03-10 NOTE — Progress Notes (Signed)
Patient trach capped per MD's order patient tolerated well SATS 98% HR 122, RR 18, will continue to monitor patient.

## 2015-03-10 NOTE — Progress Notes (Signed)
Assisted back to bed.

## 2015-03-10 NOTE — Progress Notes (Signed)
Vent on standby patient is on 35% ATC SATS 92%.
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
## 2015-03-10 NOTE — Evaluation (Signed)
Occupational Therapy Evaluation Patient Details Name: Adam Benson MRN: [id number] DOB: Jul 01, 1960 Today's Date: 03/10/2015    History of Present Illness Pt is a 54 y/o male who was transferred from [facility] with dyspnea, weight gain and LE edema. He failed BiPap, was intubated 9/4 and transferred to [facility] trach placed 9/20. He was found to have CAP with ARDS. ICU couse has been complicated by a-fib with RVR and bradycardia.    Clinical Impression   This 54 yo male admitted with above presents to acute OT with decreased endurance for activity (based on increased HR 180's and decreased sats 70's while up ambulating), decreased balance/mobility, and decreased use of Bil UEs (pre-existing). All affecting his ability to return home by himself and care for himself. He will benefit from acute OT with follow up on CIR.    Follow Up Recommendations  CIR;Supervision/Assistance - 24 hour    Equipment Recommendations  3 in 1 bedside comode;Tub/shower bench    Recommendations for Other Services Rehab consult     Precautions / Restrictions Precautions Precautions: Fall Precaution Comments: trach, watch sats and HR (sats down into 70's on 55% trach collar with activity and HR up to 180)--recovers in 1-2 minutes post sitting Restrictions Weight Bearing Restrictions: No      Mobility Bed Mobility Overal bed mobility: Needs Assistance Bed Mobility: Supine to Sit     Supine to sit: Supervision        Transfers Overall transfer level: Needs assistance Equipment used: Rolling walker (2 wheeled) Transfers: Sit to/from Stand Sit to Stand: Min assist         General transfer comment: min A +2 for ambulation for equipment,lines, tubes    Balance Overall balance assessment: Needs assistance Sitting-balance support: Feet supported;No upper extremity supported Sitting balance-Leahy Scale: Good     Standing balance support: Bilateral upper extremity supported;During functional  activity Standing balance-Leahy Scale: Poor                              ADL Overall ADL's : Needs assistance/impaired Eating/Feeding: Independent;Bed level   Grooming: Set up;Supervision/safety;Sitting   Upper Body Bathing: Set up;Supervision/ safety;Sitting   Lower Body Bathing: Moderate assistance (with min A sit<>stand)   Upper Body Dressing : Minimal assistance;Sitting   Lower Body Dressing: Maximal assistance (with min A sit<>stand)   Toilet Transfer: Minimal assistance;+2 for safety/equipment;Ambulation;RW   Toileting- Clothing Manipulation and Hygiene: Moderate assistance (with min A sit<>stand)               Vision Additional Comments: No change from baseline          Pertinent Vitals/Pain Pain Assessment: 0-10 Pain Score: 3  Pain Location: hands (right from nerve damage and left from gout flare up) Pain Descriptors / Indicators: Aching;Sore Pain Intervention(s): Monitored during session;Repositioned     Hand Dominance Right   Extremity/Trunk Assessment Upper Extremity Assessment Upper Extremity Assessment: RUE deficits/detail;LUE deficits/detail RUE Deficits / Details: web space waisting pt reports due to nerve damage RUE Coordination: decreased fine motor LUE Deficits / Details: web space waisting and contractures of digits 3-5 due to gout flare ups over the years LUE Coordination: decreased fine motor           Communication Communication Communication: Tracheostomy   Cognition Arousal/Alertness: Awake/alert Behavior During Therapy: WFL for tasks assessed/performed Overall Cognitive Status: Within Functional Limits for tasks assessed  Home Living Family/patient expects to be discharged to:: Inpatient rehab Living Arrangements: Alone   Type of Home: House             Bathroom Shower/Tub: Tub/shower unit;Curtain Shower/tub characteristics: Curtain       Home Equipment: None           Prior Functioning/Environment Level of Independence: Independent             OT Diagnosis: Generalized weakness   OT Problem List: Decreased strength;Decreased activity tolerance;Cardiopulmonary status limiting activity;Impaired UE functional use;Pain;Impaired balance (sitting and/or standing);Decreased knowledge of use of DME or AE   OT Treatment/Interventions: Self-care/ADL training;Patient/family education;Balance training;Therapeutic activities;DME and/or AE instruction    OT Goals(Current goals can be found in the care plan section) Acute Rehab OT Goals Patient Stated Goal: to get trach out by then end of the week OT Goal Formulation: With patient Time For Goal Achievement: 03/24/15 Potential to Achieve Goals: Good  OT Frequency: Min 3X/week              End of Session Equipment Utilized During Treatment: Gait belt;Rolling walker;Oxygen (55% trach collar) Nurse Communication: Mobility status (nursing saw [place] ambulating with him in hallway; NT--asked her to set him up with bath and only help prn (pt in agreement))  Activity Tolerance:  (limited by increase in HR and decrease in sats) Patient left: in chair;with call bell/phone within reach   Time: 0912-0951 OT Time Calculation (min): 39 min Charges:  OT General Charges $OT Visit: 1 Procedure OT Evaluation $Initial OT Evaluation Tier I: 1 Procedure OT Treatments $Self Care/Home Management : 23-37 mins  Evette Georges [phone] 03/10/2015, 10:41 AM
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
## 2015-03-10 NOTE — Progress Notes (Signed)
Yes feel pt could benefit from an inpt rehab admission. Noted OT eval. Discussed with RN CM. I will follow. [phone]

## 2015-03-10 NOTE — Progress Notes (Signed)
Nutrition Follow-up  DOCUMENTATION CODES:   Obesity unspecified  INTERVENTION:   No nutrition intervention warranted at this time   NUTRITION DIAGNOSIS:   Inadequate oral intake related to inability to eat as evidenced by NPO status, resolved  GOAL:   Patient will meet greater than or equal to 90% of their needs, met  MONITOR:   PO intake, Labs, Weight trends, I & O's  ASSESSMENT:   Presented to [facility] after a few days of increasing dyspnea, weight gain, & lower extremity edema. Patient initially tried on BiPAP. Required intubation and transferred to [facility] on 9/4.  Pt transferred from 36M-Medical ICU to 2C-Stepdown 10/6.  Pt working with PT upon RD visit.  Currently on ATC.  S/p FEES 10/7.  SLP recommending Regular, thin liquid diet.  Vital High Protein formula discontinued via Panda tube 10/8.  PO intake good at 100% per flowsheet records.  Diet Order:  Diet regular Room service appropriate?: Yes; Fluid consistency:: Thin  Skin:  Reviewed, no issues  Last BM:  10/10  Height:   Ht Readings from Last 1 Encounters:  03/01/15 6' (1.829 m)    Weight:   Wt Readings from Last 1 Encounters:  03/10/15 266 lb 15.6 oz (121.1 kg)    Ideal Body Weight:  80.9 kg  BMI:  Body mass index is 36.2 kg/(m^2).  Estimated Nutritional Needs:   Kcal:  2200-2400  Protein:  120-130 gm  Fluid:  2.2-2.4 L  EDUCATION NEEDS:   No education needs identified at this time  Arthur Holms, RD, LDN Pager #: [phone] After-Hours Pager #: [phone]

## 2015-03-10 NOTE — Progress Notes (Signed)
PULMONARY / CRITICAL CARE MEDICINE   Name: Adam Benson MRN: [id number] DOB: Aug 22, 1960    ADMISSION DATE:  02/01/2015 CONSULTATION DATE:  02/01/2015  REFERRING MD :  [facility]   CHIEF COMPLAINT:  Severe CAP  INITIAL PRESENTATION: 54 yo male from [place] with dyspnea, wt gain, edema.  Developed VDRF from PNA and ARDS, a fib with RVR.  STUDIES:  9/04 Echo > EF 50 to 55%  SIGNIFICANT EVENTS: 9/04 Intubated, transfer to [facility] 9/09 FOB, Fever 103 9/16 Repeat FOB 9/20 trach placed, fib rvr 9/21 leak, dislodged trach, changed to xtra long successful 9/24 moved to ICU and placed on PCV 10/2- to stepdown 10/3 - continues to talk around cuff, anxious to get trach out. 10/3- loss of volumes = trach change, bleeding 10/6 ATC x 6h , amio gtt for AF-RVR & hypotension 10/11 up in chair. Adjusting O2. Starting capping trials  SUBJECTIVE: Looks good  VITAL SIGNS: Temp:  [97.8 F (36.6 C)-98.7 F (37.1 C)] 98.1 F (36.7 C) (10/11 0827) Pulse Rate:  [43-89] 79 (10/11 0827) Resp:  [16-25] 21 (10/11 0827) BP: (94-105)/(54-64) 102/62 mmHg (10/11 0827) SpO2:  [89 %-98 %] 90 % (10/11 0827) FiO2 (%):  [35 %-60 %] 35 % (10/11 0805) Weight:  [121.1 kg (266 lb 15.6 oz)] 121.1 kg (266 lb 15.6 oz) (10/11 0455)   VENTILATOR SETTINGS: Vent Mode:  [-]  FiO2 (%):  [35 %-60 %] 35 %   INTAKE / OUTPUT:  Intake/Output Summary (Last 24 hours) at 03/10/15 1141 Last data filed at 03/10/15 0900  Gross per 24 hour  Intake    900 ml  Output   5225 ml  Net  -4325 ml    PHYSICAL EXAMINATION: General:  In bed, chr ill, , calm no distress, feels well Integument: No rash on exposed skin.  HEENT:  Trach clean, good phonation  Cardiovascular:  Regular rate s1 s2 . Pulmonary:  occ rhonchi  Abdomen: Soft. Normal bowel sounds. Nontender. Protuberant. Neurological:  Moving all 4 extremities equally.  Follows commands and alert , cooperative    CMP Latest Ref Rng 03/10/2015 03/08/2015 03/07/2015   Glucose 65 - 99 mg/dL 815(T) 94 91  BUN 6 - 20 mg/dL 15 15 47(M)  Creatinine 0.61 - 1.24 mg/dL 7.61 5.18 3.43  Sodium 135 - 145 mmol/L 138 137 134(L)  Potassium 3.5 - 5.1 mmol/L 3.9 3.6 3.2(L)  Chloride 101 - 111 mmol/L 98(L) 92(L) 87(L)  CO2 22 - 32 mmol/L 31 34(H) 35(H)  Calcium 8.9 - 10.3 mg/dL 7.3(H) 7.8(X) 7.8(E)  Total Protein 6.5 - 8.1 g/dL - 4.8(L) -  Total Bilirubin 0.3 - 1.2 mg/dL - 0.4 -  Alkaline Phos 38 - 126 U/L - 46 -  AST 15 - 41 U/L - 25 -  ALT 17 - 63 U/L - 34 -    CBC Latest Ref Rng 03/08/2015 03/07/2015 03/05/2015  WBC 4.0 - 10.5 K/uL 6.8 8.9 9.2  Hemoglobin 13.0 - 17.0 g/dL 10.7(L) 10.1(L) 9.5(L)  Hematocrit 39.0 - 52.0 % 34.9(L) 33.4(L) 31.9(L)  Platelets 150 - 400 K/uL 141(L) 142(L) 162    CBG (last 3)   Recent Labs  03/09/15 1643 03/09/15 2112 03/10/15 0826  GLUCAP 159* 231* 126*   ABG    No results found.  LINES/TUBES: ETT 9/4 >> 9/20 R IJ CVL 9/4 >> 9/9 L IJ CVL 9/9 >> 9/24 picc 9/24>> Trach (DF) 9/20 >>  CULTURES: C diff 9/13 >> Antigen positive, toxin negative >> colonization Blood 9/22 >>  negative Sputum 9/23 >> few C albicans Urine 9/23 >> multiple species  ASSESSMENT / PLAN:  Trach change to XLT 9/21>> A: Acute on chronic hypoxic respiratory failure - Pneumonia. Improving with diuresis Ventilator Dependent Respiratory Failure - s/p tracheostomy.  H/O Asthma. 10/4 low volumes, trach changed, bleeding -->resolved >on ATC 24/7 Plan: fio2 titrate to sat 90% Cont PMV per SLP direct observation He speaks around cuff at baseline well; will cap-->if cough mechanics good will decannulate He again is neg 4 liters  PICC 9/24>>  A fib with RVR - CHA2DS2-VASc score 1. Acute Diastolic CHF - resolved. Plan Diltiazem to  via tube to q8 h ASA  daily. Monitor on tele. Lasix to oral Avoiding amio  Acute on Chronic Renal Failure (CKD stage III) - Resolved. Hypernatremia - resolved. Hypokalemia - Resolved. Plan: k  supp Maintain lasix, to 20 mg continued  Diarrhea w/ flexiseal /CDiff colonization. >passed swallow eval. Intake good Plan: Pepcid via tube q12hr. Diet on vent ok by mouth  Anemia - improving. Secondary to cricital illness. No signs of active bleeding. VTE prophylaxis. Bleeding trach post change 10/3- resolved Plan: Dc sub q hep as full ambulation noted  Severe CAP - No organism identified; s/p treatment with vancomycin & Merrem. Septic shock - resolved. C diff colonization. Plan: Trend fever None noted Follow secretions  Hyperglycemia  Plan: Continuing SSI.  Delirium - resolved. Chronic Pain - on Methadone. Deconditioning s/p prolonged critical illness Plan: Continue Lyrica  po q8hr (home dose) Methadone to daily x1 more day then d/c PT active Fentanyl IV prn. Would like to see if he will agree to CIR  STAFF NOTE: I, Rory Percy, MD FACP have personally reviewed patient's available data, including medical history, events of note, physical examination and test results as part of my evaluation. I have discussed with resident/NP and other care providers such as pharmacist, RN and RRT. In addition, I personally evaluated patient and elicited key findings of: he contineus to do well with lasix, continue, bmet follo wup, even though has done poor with PMV, he speaks around easily and eats, when secretions are lower will cap 24 hr and likley decanulate, if secretions remains same will consider decan if able to cough well through mouth around trach which is likely, ambulation important   [name]. Tyson Alias, MD, FACP Pgr: [phone] [facility] 03/10/2015 1:48 PM

## 2015-03-11 ENCOUNTER — Inpatient Hospital Stay (HOSPITAL_COMMUNITY)
Admission: AD | Admit: 2015-03-11 | Discharge: 2015-03-20 | DRG: 947 | Disposition: A | Discharge status: Home / Self Care | Payer: Medicaid Other | Source: Intra-hospital | Attending: Physical Medicine & Rehabilitation | Admitting: Physical Medicine & Rehabilitation

## 2015-03-11 ENCOUNTER — Encounter (HOSPITAL_COMMUNITY): Payer: Self-pay | Admitting: *Deleted

## 2015-03-11 DIAGNOSIS — S143XXS Injury of brachial plexus, sequela: Secondary | ICD-10-CM | POA: Diagnosis not present

## 2015-03-11 DIAGNOSIS — G629 Polyneuropathy, unspecified: Secondary | ICD-10-CM | POA: Diagnosis present

## 2015-03-11 DIAGNOSIS — G6289 Other specified polyneuropathies: Secondary | ICD-10-CM

## 2015-03-11 DIAGNOSIS — Z9889 Other specified postprocedural states: Secondary | ICD-10-CM | POA: Diagnosis not present

## 2015-03-11 DIAGNOSIS — E876 Hypokalemia: Secondary | ICD-10-CM | POA: Diagnosis present

## 2015-03-11 DIAGNOSIS — G589 Mononeuropathy, unspecified: Secondary | ICD-10-CM | POA: Diagnosis not present

## 2015-03-11 DIAGNOSIS — Z9981 Dependence on supplemental oxygen: Secondary | ICD-10-CM

## 2015-03-11 DIAGNOSIS — I482 Chronic atrial fibrillation: Secondary | ICD-10-CM | POA: Diagnosis present

## 2015-03-11 DIAGNOSIS — Z930 Tracheostomy status: Secondary | ICD-10-CM | POA: Diagnosis not present

## 2015-03-11 DIAGNOSIS — R609 Edema, unspecified: Secondary | ICD-10-CM | POA: Diagnosis not present

## 2015-03-11 DIAGNOSIS — I5031 Acute diastolic (congestive) heart failure: Secondary | ICD-10-CM | POA: Diagnosis present

## 2015-03-11 DIAGNOSIS — R5381 Other malaise: Secondary | ICD-10-CM | POA: Diagnosis present

## 2015-03-11 DIAGNOSIS — Z6835 Body mass index (BMI) 35.0-35.9, adult: Secondary | ICD-10-CM

## 2015-03-11 DIAGNOSIS — R339 Retention of urine, unspecified: Secondary | ICD-10-CM | POA: Diagnosis present

## 2015-03-11 DIAGNOSIS — R000 Tachycardia, unspecified: Secondary | ICD-10-CM | POA: Diagnosis not present

## 2015-03-11 HISTORY — DX: Pneumonia, unspecified organism: J18.9

## 2015-03-11 LAB — COMPREHENSIVE METABOLIC PANEL
ALBUMIN: 2.3 g/dL — AB (ref 3.5–5.0)
ALK PHOS: 50 U/L (ref 38–126)
ALT: 31 U/L (ref 17–63)
ANION GAP: 10 (ref 5–15)
AST: 19 U/L (ref 15–41)
BILIRUBIN TOTAL: 0.3 mg/dL (ref 0.3–1.2)
BUN: 14 mg/dL (ref 6–20)
CALCIUM: 8.2 mg/dL — AB (ref 8.9–10.3)
CO2: 30 mmol/L (ref 22–32)
Chloride: 94 mmol/L — ABNORMAL LOW (ref 101–111)
Creatinine, Ser: 0.86 mg/dL (ref 0.61–1.24)
GLUCOSE: 97 mg/dL (ref 65–99)
Potassium: 3.1 mmol/L — ABNORMAL LOW (ref 3.5–5.1)
Sodium: 134 mmol/L — ABNORMAL LOW (ref 135–145)
TOTAL PROTEIN: 5 g/dL — AB (ref 6.5–8.1)

## 2015-03-11 LAB — CK TOTAL AND CKMB (NOT AT ARMC)
CK, MB: 3.6 ng/mL (ref 0.5–5.0)
Relative Index: INVALID (ref 0.0–2.5)
Total CK: 27 U/L — ABNORMAL LOW (ref 49–397)

## 2015-03-11 LAB — GLUCOSE, CAPILLARY
GLUCOSE-CAPILLARY: 161 mg/dL — AB (ref 65–99)
Glucose-Capillary: 102 mg/dL — ABNORMAL HIGH (ref 65–99)
Glucose-Capillary: 81 mg/dL (ref 65–99)
Glucose-Capillary: 133 mg/dL — ABNORMAL HIGH (ref 65–99)

## 2015-03-11 MED ORDER — POTASSIUM CHLORIDE CRYS ER 20 MEQ PO TBCR
40.0000 meq | EXTENDED_RELEASE_TABLET | Freq: Once | ORAL | Status: AC
  Administered 2015-03-11: 40 meq via ORAL
  Filled 2015-03-11: qty 2

## 2015-03-11 MED ORDER — FAMOTIDINE 20 MG PO TABS
20.0000 mg | ORAL_TABLET | Freq: Two times a day (BID) | ORAL | Status: DC
  Administered 2015-03-11 – 2015-03-20 (×18): 20 mg via ORAL
  Filled 2015-03-11 (×6): qty 1
  Filled 2015-03-11: qty 2
  Filled 2015-03-11 (×8): qty 1
  Filled 2015-03-11: qty 2
  Filled 2015-03-11 (×2): qty 1
  Filled 2015-03-11: qty 2

## 2015-03-11 MED ORDER — ACETAMINOPHEN 325 MG PO TABS: 650.0000 mg | ORAL_TABLET | ORAL | Status: DC | PRN

## 2015-03-11 MED ORDER — DILTIAZEM HCL 90 MG PO TABS
90.0000 mg | ORAL_TABLET | Freq: Three times a day (TID) | ORAL | Status: DC
  Administered 2015-03-11 – 2015-03-20 (×26): 90 mg via ORAL
  Filled 2015-03-11 (×30): qty 1

## 2015-03-11 MED ORDER — OXYCODONE HCL 5 MG PO TABS
10.0000 mg | ORAL_TABLET | ORAL | Status: DC | PRN
  Administered 2015-03-11 – 2015-03-20 (×44): 10 mg via ORAL
  Filled 2015-03-11 (×44): qty 2

## 2015-03-11 MED ORDER — ONDANSETRON HCL 4 MG/2ML IJ SOLN: 4.0000 mg | Freq: Four times a day (QID) | INTRAMUSCULAR | Status: DC | PRN

## 2015-03-11 MED ORDER — SORBITOL 70 % SOLN
30.0000 mL | Freq: Every day | Status: DC | PRN
  Administered 2015-03-18: 30 mL via ORAL
  Filled 2015-03-11 (×2): qty 30

## 2015-03-11 MED ORDER — HYDROCODONE-HOMATROPINE 5-1.5 MG/5ML PO SYRP: 5.0000 mL | ORAL_SOLUTION | ORAL | Status: DC | PRN

## 2015-03-11 MED ORDER — PREGABALIN 100 MG PO CAPS
200.0000 mg | ORAL_CAPSULE | Freq: Three times a day (TID) | ORAL | Status: DC
  Administered 2015-03-11 – 2015-03-20 (×26): 200 mg via ORAL
  Filled 2015-03-11 (×16): qty 2
  Filled 2015-03-11: qty 4
  Filled 2015-03-11 (×9): qty 2

## 2015-03-11 MED ORDER — FENTANYL 12 MCG/HR TD PT72
25.0000 ug | MEDICATED_PATCH | TRANSDERMAL | Status: DC
  Administered 2015-03-13 – 2015-03-15 (×2): 25 ug via TRANSDERMAL
  Filled 2015-03-11 (×3): qty 2

## 2015-03-11 MED ORDER — ANTISEPTIC ORAL RINSE SOLUTION (CORINZ)
7.0000 mL | Freq: Four times a day (QID) | OROMUCOSAL | Status: DC
  Administered 2015-03-12 – 2015-03-16 (×7): 7 mL via OROMUCOSAL

## 2015-03-11 MED ORDER — SODIUM CHLORIDE 0.9 % IJ SOLN
10.0000 mL | INTRAMUSCULAR | Status: DC | PRN
  Administered 2015-03-11 – 2015-03-12 (×2): 10 mL
  Administered 2015-03-13 – 2015-03-14 (×2): 20 mL
  Administered 2015-03-16 (×2): 10 mL
  Filled 2015-03-11 (×5): qty 40

## 2015-03-11 MED ORDER — PREDNISONE 20 MG PO TABS
20.0000 mg | ORAL_TABLET | Freq: Every day | ORAL | Status: DC
  Administered 2015-03-12 – 2015-03-20 (×9): 20 mg via ORAL
  Filled 2015-03-11 (×9): qty 1

## 2015-03-11 MED ORDER — CHLORHEXIDINE GLUCONATE 0.12% ORAL RINSE (MEDLINE KIT)
15.0000 mL | Freq: Two times a day (BID) | OROMUCOSAL | Status: DC
  Administered 2015-03-12 – 2015-03-20 (×13): 15 mL via OROMUCOSAL
  Filled 2015-03-11 (×3): qty 15

## 2015-03-11 MED ORDER — ONDANSETRON HCL 4 MG PO TABS: 4.0000 mg | ORAL_TABLET | Freq: Four times a day (QID) | ORAL | Status: DC | PRN

## 2015-03-11 MED ORDER — FUROSEMIDE 40 MG PO TABS
40.0000 mg | ORAL_TABLET | Freq: Every day | ORAL | Status: DC
  Administered 2015-03-12 – 2015-03-20 (×9): 40 mg via ORAL
  Filled 2015-03-11 (×9): qty 1

## 2015-03-11 NOTE — Progress Notes (Signed)
Pt was discharged to inpatient rehab.  This nurse administered fentanyl prior to transport. There was waste that was not able to be logged in the pyxis.  This was wasted in the sharps container and was witnessed by Ernestine Conrad RN
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
## 2015-03-11 NOTE — Progress Notes (Signed)
Physical Therapy Treatment Patient Details Name: Adam Benson MRN: [id number] DOB: 1960-10-21 Today's Date: 03/11/2015    History of Present Illness Pt is a 54 y/o male who was transferred from [facility] with dyspnea, weight gain and LE edema. He failed BiPap, was intubated 9/4 and transferred to [facility] trach placed 9/20. He was found to have CAP with ARDS. ICU couse has been complicated by a-fib with RVR and bradycardia.     PT Comments    Patient seen for therapy, motivated to improve function. Patient tolerated increased activity this session despite elevations in HR and RR.  Elevated HR and desaturations in oxygen with and without trach cap possibly related to deconditioning as patient rebounds quickly with momentary rest breaks and cues. Continue to recommend aggressive comprehensive therapies to address functional mobility and conditioning.    Follow Up Recommendations  Supervision/Assistance - 24 hour;CIR     Equipment Recommendations  Rolling walker with 5" wheels;Wheelchair (measurements PT);Wheelchair cushion (measurements PT)    Recommendations for Other Services Rehab consult     Precautions / Restrictions Precautions Precautions: Fall Precaution Comments: trach, watch sats and HR (sats down into 70's on 55% trach collar with activity and HR up to 180)--recovers in 1-2 minutes post sitting Restrictions Weight Bearing Restrictions: No    Mobility  Bed Mobility Overal bed mobility: Needs Assistance Bed Mobility: Supine to Sit     Supine to sit: Supervision Sit to supine: Min assist   General bed mobility comments: supervision for lines, pt able to transfer to EOB with rail without physical assist. HR elevated with bed mobility to upper 150s. increased effort noted by patient during transition  Transfers Overall transfer level: Needs assistance Equipment used: Rolling walker (2 wheeled) Transfers: Sit to/from Stand Sit to Stand: Min guard;Min assist          General transfer comment: Min assist for safety, increased effort noted, again spiked HR during transition. patient cued for safety and hand placement with use of RW  Ambulation/Gait Ambulation/Gait assistance: Min guard;Min assist Ambulation Distance (Feet): 60 Feet (49ft, 26ft with trach cap, then 60 ft uncapped) Assistive device: Rolling walker (2 wheeled) Gait Pattern/deviations: Step-through pattern;Decreased stride length;Trunk flexed     General Gait Details: VCs for pursed lip breathing and nasal inhalation, patient noted to breathe through mouth despite cues. Elevated HR with and without cap to 200s, on 6 liters initially then increased to 8 liters. Patient RR elevated to 50s with activity. approx 2 minutes to rebound with seated rest break on 8 liters, SpO2 dropping to 70s with ambulation.   Stairs            Wheelchair Mobility    Modified Rankin (Stroke Patients Only)       Balance     Sitting balance-Leahy Scale: Good Sitting balance - Comments: Requires UE support and/or assist from therapist to maintain sitting balance.    Standing balance support: Bilateral upper extremity supported Standing balance-Leahy Scale: Poor                      Cognition Arousal/Alertness: Awake/alert Behavior During Therapy: WFL for tasks assessed/performed Overall Cognitive Status: Within Functional Limits for tasks assessed                      Exercises      General Comments        Pertinent Vitals/Pain Pain Assessment: 0-10 Faces Pain Scale: Hurts whole lot Pain Location: hands and  back Pain Descriptors / Indicators: Aching Pain Intervention(s): Monitored during session    Home Living                      Prior Function            PT Goals (current goals can now be found in the care plan section) Acute Rehab PT Goals Patient Stated Goal: to get trach out by then end of the week PT Goal Formulation: With patient Time For Goal  Achievement: 03/19/15 Potential to Achieve Goals: Good Progress towards PT goals: Progressing toward goals    Frequency  Min 3X/week    PT Plan Current plan remains appropriate    Co-evaluation   Reason for Co-Treatment: Complexity of the patient's impairments (multi-system involvement) PT goals addressed during session: Mobility/safety with mobility       End of Session Equipment Utilized During Treatment: Gait belt;Oxygen Activity Tolerance: Patient tolerated treatment well Patient left: in chair;with call bell/phone within reach     Time: 0858-0926 PT Time Calculation (min) (ACUTE ONLY): 28 min  Charges:  $Gait Training: 8-22 mins                    G CodesFabio Asa 03-23-2015, 10:54 AM Charlotte Crumb, PT DPT  [phone]
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
## 2015-03-11 NOTE — Progress Notes (Signed)
Received pt. As transfer from 2 C.Pt. Oriented to the unit routine.Safety plan was explained,fall prevention plan was explained and sign.Keep monitoring pt. Closely and assessing his needs.

## 2015-03-11 NOTE — Interval H&P Note (Signed)
Adam Benson was admitted today to Inpatient Rehabilitation with the diagnosis of debilitation/sepsis related to acute on chronic respiratory failure/multi-medical.  [name] history has been reviewed, patient examined, and there is no change in status.  Patient continues to be appropriate for intensive inpatient rehabilitation.  I have reviewed the patient's chart and labs.  Questions were answered to the patient's satisfaction. The PAPE has been reviewed and assessment remains appropriate.  Adam Benson 03/11/2015, 6:44 PM

## 2015-03-11 NOTE — Progress Notes (Signed)
I met with pt and his Dad at bedside. We discussed an inpt rehab admission to assist him in his transition home. He is in agreement. He gets frustrated with the questioning and clarification of his needs and goals as he feels he has been repeatedly asked the same questions. I clarified that the questions I ask are not previously asked or in his chart and that I am assisting him in setting his goals and needs for rehab so we can assist him in his rehab recovery. I will make the arrangements to admit today. RN CM and SW are aware. [phone]

## 2015-03-11 NOTE — Progress Notes (Signed)
Occupational Therapy Treatment Patient Details Name: Adam Benson MRN: [id number] DOB: 19-Feb-1961 Today's Date: 03/11/2015    History of present illness Pt is a 54 y/o male who was transferred from [facility] with dyspnea, weight gain and LE edema. He failed BiPap, was intubated 9/4 and transferred to [facility] trach placed 9/20. He was found to have CAP with ARDS. ICU couse has been complicated by a-fib with RVR and bradycardia.    OT comments  This 55 yo male admitted with above presents to acute OT making progress with continued increased HR and decreased sats with activity (taking a longer time to recover today post sitting possibly due to capped trach). He will continue to benefit from acute OT with follow up OT on CIR to get to a Mod I to return home alone most of the time.  Follow Up Recommendations  CIR;Supervision/Assistance - 24 hour    Equipment Recommendations  3 in 1 bedside comode;Tub/shower bench       Precautions / Restrictions Precautions Precautions: Fall Precaution Comments: trach, watch sats and HR (sats down into 60's on 8 liters nasal canula with activity and HR up to 200) with trach capped--recovers in 2 minutes post sitting Restrictions Weight Bearing Restrictions: No       Mobility Bed Mobility Overal bed mobility: Needs Assistance Bed Mobility: Supine to Sit     Supine to sit: Supervision Sit to supine: Min assist   General bed mobility comments: supervision for lines, pt able to transfer to EOB with rail without physical assist. HR elevated with bed mobility to upper 150s. increased effort noted by patient during transition  Transfers Overall transfer level: Needs assistance Equipment used: Rolling walker (2 wheeled) Transfers: Sit to/from Stand Sit to Stand: Min guard;Min assist         General transfer comment: Min assist for safety, increased effort noted, again spiked HR during transition. patient cued for safety and hand placement with use  of RW    Balance Overall balance assessment: Needs assistance Sitting-balance support: Feet supported;No upper extremity supported Sitting balance-Leahy Scale: Good Sitting balance - Comments: Requires UE support and/or assist from therapist to maintain sitting balance.    Standing balance support: Bilateral upper extremity supported Standing balance-Leahy Scale: Poor                     ADL Overall ADL's : Needs assistance/impaired                       Lower Body Dressing Details (indicate cue type and reason): Educated on the use of a sock aid and reacher for LBD Toilet Transfer: Min guard;+2 for safety/equipment;Ambulation;RW             General ADL Comments: Continue to educate pt on purse lipped breathing--he gets frustrated with this saying he knows how to breath and he is breathing the best that he can.                 Cognition   Behavior During Therapy: WFL for tasks assessed/performed Overall Cognitive Status: Within Functional Limits for tasks assessed                                Pertinent Vitals/ Pain       Pain Assessment: 0-10 Faces Pain Scale: Hurts whole lot Pain Location: hands and back Pain Descriptors / Indicators: Aching Pain Intervention(s): Monitored  during session  Home Living     Available Help at Discharge: Other (Comment) (Dad lives near and can provide supervision or errand assist.)   Home Access: Stairs to enter Secretary/administrator of Steps: 1 Entrance Stairs-Rails: None Home Layout: One level         Firefighter: Standard Bathroom Accessibility: Yes How Accessible: Accessible via walker Home Equipment: Other (comment) (oxygen pta)   Additional Comments: pt vented with trach twice this year, Jan/2016 and sept/2016  Lives With: Alone;Other (Comment) (Dad lives within 3 miles)    Prior Functioning/Environment          Comments: totally independent pta. O2 2-3 liters at home pta.  Doesnt use when outside   Frequency Min 3X/week     Progress Toward Goals  OT Goals(current goals can now be found in the care plan section)  Progress towards OT goals: Progressing toward goals  Acute Rehab OT Goals Patient Stated Goal: to get trach out by then end of the week  Plan Discharge plan remains appropriate    Co-evaluation    PT/OT/SLP Co-Evaluation/Treatment: Yes (partial) Reason for Co-Treatment: Complexity of the patient's impairments (multi-system involvement) PT goals addressed during session: Mobility/safety with mobility OT goals addressed during session: ADL's and self-care;Strengthening/ROM      End of Session Equipment Utilized During Treatment: Gait belt;Rolling walker;Oxygen (6-8 liters of O2)   Activity Tolerance Patient tolerated treatment well (still having decreased sats and increased HR with any activity)   Patient Left in chair;with call bell/phone within reach   Nurse Communication          Time: [id number] OT Time Calculation (min): 34 min  Charges: OT General Charges $OT Visit: 1 Procedure OT Evaluation $Initial OT Evaluation Tier I: 1 Procedure OT Treatments $Therapeutic Activity: 8-22 mins  Evette Georges [phone] 03/11/2015, 12:33 PM
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
## 2015-03-11 NOTE — Progress Notes (Signed)
PULMONARY / CRITICAL CARE MEDICINE   Name: Adam Benson MRN: [id number] DOB: 07-Sep-1960    ADMISSION DATE:  02/01/2015 CONSULTATION DATE:  02/01/2015  REFERRING MD :  [facility]   CHIEF COMPLAINT:  Severe CAP  INITIAL PRESENTATION: 54 yo male from [place] with dyspnea, wt gain, edema.  Developed VDRF from PNA and ARDS, a fib with RVR.  STUDIES:  9/04 Echo > EF 50 to 55%   LINES/TUBES: ETT 9/4 >> 9/20 R IJ CVL 9/4 >> 9/9 L IJ CVL 9/9 >> 9/24 picc 9/24>> Trach (DF) 9/20 >>  CULTURES: C diff 9/13 >> Antigen positive, toxin negative >> colonization Blood 9/22 >> negative Sputum 9/23 >> few C albicans Urine 9/23 >> multiple species  SIGNIFICANT EVENTS: 9/04 Intubated, transfer to [facility] 9/09 FOB, Fever 103 9/16 Repeat FOB 9/20 trach placed, fib rvr 9/21 leak, dislodged trach, changed to xtra long successful 9/24 moved to ICU and placed on PCV 10/2- to stepdown 10/3 - continues to talk around cuff, anxious to get trach out. 10/3- loss of volumes = trach change, bleeding 10/6 ATC x 6h , amio gtt for AF-RVR & hypotension 10/11 up in chair. Adjusting O2. Starting capping trials  SUBJECTIVE: Looks great.  Working with PT.  Dropped sats 70's sustained with ambulation on 8L [place].   VITAL SIGNS: Temp:  [97.6 F (36.4 C)-98 F (36.7 C)] 97.9 F (36.6 C) (10/12 0830) Pulse Rate:  [39-112] 112 (10/12 0830) Resp:  [14-26] 19 (10/12 0830) BP: (87-115)/(48-76) 97/59 mmHg (10/12 0830) SpO2:  [90 %-99 %] 91 % (10/12 0830) FiO2 (%):  [36 %] 36 % (10/11 1541)   VENTILATOR SETTINGS: Vent Mode:  [-]  FiO2 (%):  [36 %] 36 %   INTAKE / OUTPUT:  Intake/Output Summary (Last 24 hours) at 03/11/15 0917 Last data filed at 03/11/15 0817  Gross per 24 hour  Intake    500 ml  Output   2600 ml  Net  -2100 ml    PHYSICAL EXAMINATION: General: pleasant male, NAD sitting in chair in hallway taking break from PT Integument: No rash on exposed skin.  HEENT:  Trach clean, good  phonation with trach capped Cardiovascular:  Regular rate s1 s2 . Pulmonary:  resps even, mildly labored after ambulation, occ rhonchi  Abdomen: Soft. Normal bowel sounds. Nontender. Protuberant. Neurological:  Moving all 4 extremities equally.  Follows commands and alert , cooperative    CMP Latest Ref Rng 03/11/2015 03/10/2015 03/08/2015  Glucose 65 - 99 mg/dL 97 409(W) 94  BUN 6 - 20 mg/dL Creatinine 0.61 - 1.24 mg/dL 1.19 1.47 8.29  Sodium 135 - 145 mmol/L 134(L) 138 137  Potassium 3.5 - 5.1 mmol/L 3.1(L) 3.9 3.6  Chloride 101 - 111 mmol/L 94(L) 98(L) 92(L)  CO2 22 - 32 mmol/L 30 31 34(H)  Calcium 8.9 - 10.3 mg/dL 8.2(L) 8.6(L) 8.4(L)  Total Protein 6.5 - 8.1 g/dL 5.0(L) - 4.8(L)  Total Bilirubin 0.3 - 1.2 mg/dL 0.3 - 0.4  Alkaline Phos 38 - 126 U/L 50 - 46  AST 15 - 41 U/L 19 - 25  ALT 17 - 63 U/L 31 - 34    CBC Latest Ref Rng 03/08/2015 03/07/2015 03/05/2015  WBC 4.0 - 10.5 K/uL 6.8 8.9 9.2  Hemoglobin 13.0 - 17.0 g/dL 10.7(L) 10.1(L) 9.5(L)  Hematocrit 39.0 - 52.0 % 34.9(L) 33.4(L) 31.9(L)  Platelets 150 - 400 K/uL 141(L) 142(L) 162    CBG (last 3)   Recent Labs  03/10/15  2130 03/11/15 0123 03/11/15 0828  GLUCAP 152* 102* 81   ABG  No results found.   ASSESSMENT / PLAN:  Trach change to XLT 9/21>> A: Acute on chronic hypoxic respiratory failure - Pneumonia. Improving with diuresis Ventilator Dependent Respiratory Failure - s/p tracheostomy.  H/O Asthma. 10/4 low volumes, trach changed, bleeding -->resolved >on ATC 24/7 Plan: fio2 titrate to sat 90% Cont trach capped - consider extended trial capping with ongoing episodes desat and increased secretions PT/OT  Ok for CIR, can follow and decide on decannulation there    A fib with RVR - CHA2DS2-VASc score 1. Acute Diastolic CHF - resolved. Plan Diltiazem to 90mg  via tube to q8 h -- consider decrease if further episodes bradycardia  ASA 325mg  daily. Monitor on tele. Cont PO lasix  Avoiding  amio  Acute on Chronic Renal Failure (CKD stage III) - Resolved. Hypernatremia - resolved. Hypokalemia - Resolved. Plan: k supp PO lasix  Intermittent chem   Diarrhea w/ flexiseal /CDiff colonization. >passed swallow eval. Intake good Plan: Pepcid via tube q12hr. Cont PO diet as tol   Anemia - improving. Secondary to critical illness. No signs of active bleeding. VTE prophylaxis. Bleeding trach post change 10/3- resolved Plan: Off SQ heparin with full ambulation  F/u CBC  Severe CAP - No organism identified; s/p treatment with vancomycin & Merrem. Septic shock - resolved. C diff colonization. Plan: Monitor off abx  Follow secretions  Hyperglycemia  Plan: Continuing SSI.  Delirium - resolved. Chronic Pain - on Methadone. Deconditioning s/p prolonged critical illness Plan: Continue Lyrica 200mg  po q8hr (home dose) D/c methadone 10/12 -- have been weaning to off  Ongoing PT/OT Fentanyl IV prn.   Plan CIR when bed available, PCCM can continue to follow there and decide when ready for decannulation.   Dirk Dress, NP 03/11/2015  9:17 AM Pager: [phone] or [phone]
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
## 2015-03-11 NOTE — Discharge Summary (Signed)
Physician Discharge Summary  Patient ID: Adam Benson MRN: [id number] DOB/AGE: [birth year] 54 y.o.  Admit date: 02/01/2015 Discharge date: 03/11/2015    Discharge Diagnoses:  Principal Problem:   ARDS (adult respiratory distress syndrome) (HCC) Active Problems:   Chronic respiratory failure with hypoxia (HCC)   Restrictive airway disease   Acute respiratory failure with hypoxemia (HCC)   Septic shock (HCC)   Encounter for central line placement   History of ETT   Acute diastolic heart failure (HCC)   CKD (chronic kidney disease) stage 3, GFR 30-59 ml/min   C. difficile colitis   Acute respiratory failure (HCC)   Acute on chronic respiratory failure with hypoxia (HCC)   Cough   Tracheostomy, acute management (HCC)   CAP (community acquired pneumonia)   ILD (interstitial lung disease) (HCC)    Brief Summary: Adam Benson is a 54 y.o. y/o male with a PMH of AFib, HTN, GERD previous hx PNA, respiratory failure and trach in January 2016 who presented 9/4 with cough, dyspnea.  Initially tried on bipap but failed requiring intubation in setting severe CAP (no organism identified) with severe ARDS.  ICU course was c/b AFib with RVR, shock, acute on chronic dCHF.  He was treated with IV abx, IV diuresis.  Pt failed weaning efforts and ultimately required trach placement 02/17/15.  Course then c/b trach dislodgement and bleeding, now s/p XL trach and f/u by ENT with no further complications.  Now tolerating ATC at all times and started on capping trials 9/11.  Working with physical therapy and ready for d/c to inpatient rehab for further rehab efforts.    STUDIES:  9/04 Echo > EF 50 to 55%  LINES/TUBES: ETT 9/4 >> 9/20 R IJ CVL 9/4 >> 9/9 L IJ CVL 9/9 >> 9/24 picc 9/24>> Trach (DF) 9/20 >>  CULTURES: C diff 9/13 >> Antigen positive, toxin negative >> colonization Blood 9/22 >> negative Sputum 9/23 >> few C albicans Urine 9/23 >> multiple species  SIGNIFICANT EVENTS: 9/04  Intubated, transfer to [facility] 9/09 FOB, Fever 103 9/16 Repeat FOB 9/20 trach placed, fib rvr 9/21 leak, dislodged trach, changed to xtra long successful 9/24 moved to ICU and placed on PCV 10/2- to stepdown 10/3 - continues to talk around cuff, anxious to get trach out. 10/3- loss of volumes = trach change, bleeding 10/6 ATC x 6h , amio gtt for AF-RVR & hypotension 10/11 up in chair. Adjusting O2. Starting capping trials                                                                     D/c Plan by Discharge Diagnosis  Acute on chronic hypoxic respiratory failure - r/t CAP with ARDS now c/b significant deconditioning  Ventilator Dependent Respiratory Failure - s/p tracheostomy.  H/O Asthma. CAP  Trach  Plan: Supplemental O2 as needed to maintain sats >90% Cont trach capped - consider extended trial capping with ongoing episodes of desaturation and increased secretions PT/OT - to inpatient rehab  PCCM will continue to follow in Rehab and decide on timing of decannulation  outpt pulmonary fu  Autoimmune panel pending 10/12 r/t recurrent ARDS   A fib with RVR - CHA2DS2-VASc score 1. Acute on ?chronic Diastolic CHF - resolved. Bradycardia - resolved  Plan Diltiazem $RemoveBefore'90mg'[id number]$  via tube to q8 h -- consider decrease if further episodes bradycardia  ASA $Re'325mg'bjr$  daily. Monitor on tele. Cont PO lasix  Avoiding amio  Acute on Chronic Renal Failure (CKD stage III) - Resolved. Hypernatremia - resolved. Hypokalemia - Resolved. Plan: k supp PO lasix  Intermittent chem   Diarrhea w/ flexiseal /CDiff colonization. >passed swallow eval. Intake good Plan: S/p CDiff rx  Pepcid via tube q12hr. Cont PO diet as tol   Anemia - improving. Secondary to critical illness. No signs of active bleeding. VTE prophylaxis. Bleeding trach post change 10/3- resolved Plan: Off SQ heparin with full ambulation  F/u CBC  Severe CAP - No organism identified; s/p treatment with vancomycin &  Merrem. Septic shock - resolved. C diff colonization. Plan: Monitor off abx  Follow secretions  Hyperglycemia  Plan: Continuing SSI.  Delirium - resolved. Chronic Pain - on Methadone. Deconditioning s/p prolonged critical illness Plan: Continue Lyrica $RemoveBeforeD'200mg'[id number]$  po q8hr (home dose) D/c methadone 10/12 -- have been weaning to off  Ongoing PT/OT Fentanyl IV prn   Filed Vitals:   03/11/15 0449 03/11/15 0500 03/11/15 0759 03/11/15 0830  BP: $Re'87/64 94/58 94/58 'rgO$ 97/59  Pulse: 74 81 80 112  Temp: 97.9 F (36.6 C)   97.9 F (36.6 C)  TempSrc: Oral   Oral  Resp: $Remo'18 20 20 19  'NaOhY$ Height:      Weight:      SpO2: 98% 97% 97% 91%     Discharge Labs  BMET  Recent Labs Lab 03/06/15 0045 03/06/15 0529 03/07/15 0450 03/08/15 0515 03/10/15 0414 03/11/15 0611  NA 134* 131* 134* 137 138 134*  K 3.8 3.1* 3.2* 3.6 3.9 3.1*  CL 89* 87* 87* 92* 98* 94*  CO2 35* 35* 35* 34* 31 30  GLUCOSE 127* 123* 91 94 118* 97  BUN 30* 27* 24* $Remov'15 15 14  'Qeohde$ CREATININE 0.79 0.78 0.73 0.68 0.77 0.86  CALCIUM 8.4* 8.1* 8.6* 8.4* 8.6* 8.2*  MG 1.8 1.7  --   --   --   --   PHOS  --  3.9  --   --   --   --      CBC   Recent Labs Lab 03/05/15 0408 03/07/15 0450 03/08/15 0515  HGB 9.5* 10.1* 10.7*  HCT 31.9* 33.4* 34.9*  WBC 9.2 8.9 6.8  PLT 162 142* 141*   Anti-Coagulation No results for input(s): INR in the last 168 hours.         Follow-up Information    Follow up with O'BUCH,GRETA, PA-C. Schedule an appointment as soon as possible for a visit in 1 week.   Specialty:  Internal Medicine   Why:  after d/c from rehab    Contact information:   [address] [place] [address] [phone]       Follow up with PARRETT,TAMMY, NP On 04/13/2015.   Specialty:  Pulmonary Disease   Why:  11:00am  -- call prior to appointment to see if availablility in [place] -- We are starting a clinic in [place] but unsure what date it will open.    Contact information:   [address] [place] [address] [phone]          Medication List    TAKE these medications        colchicine 0.6 MG tablet  Take 0.6 mg by mouth 2 (two) times daily.     diltiazem 240 MG 24 hr capsule  Commonly known as:  CARDIZEM CD  Take 240 mg by mouth daily.     Febuxostat 80 MG Tabs  Take 80 mg by mouth daily.     fenofibrate 145 MG tablet  Commonly known as:  TRICOR  Take 145 mg by mouth daily.     furosemide 20 MG tablet  Commonly known as:  LASIX  Take 20 mg by mouth daily.     metoprolol tartrate 25 MG tablet  Commonly known as:  LOPRESSOR  Take 25 mg by mouth 2 (two) times daily.     omeprazole 20 MG capsule  Commonly known as:  PRILOSEC  Take 20 mg by mouth 2 (two) times daily before a meal.     pregabalin 200 MG capsule  Commonly known as:  LYRICA  Take 200 mg by mouth. Take 1 capsule three times a day.          Disposition: CIR  Discharged Condition: Gunther Zawadzki has met maximum benefit of inpatient care and is medically stable and cleared for discharge.  Patient is pending follow up as above.      Time spent on disposition:  Greater than 35 minutes.   SignedNickolas Madrid, NP 03/11/2015  11:36 AM Pager: [phone] or [phone]

## 2015-03-12 ENCOUNTER — Inpatient Hospital Stay (HOSPITAL_COMMUNITY): Payer: Medicaid Other | Admitting: Physical Therapy

## 2015-03-12 ENCOUNTER — Inpatient Hospital Stay (HOSPITAL_COMMUNITY): Payer: Medicaid Other

## 2015-03-12 ENCOUNTER — Inpatient Hospital Stay (HOSPITAL_COMMUNITY): Payer: Medicaid Other | Admitting: Speech Pathology

## 2015-03-12 DIAGNOSIS — R609 Edema, unspecified: Secondary | ICD-10-CM

## 2015-03-12 DIAGNOSIS — R5381 Other malaise: Principal | ICD-10-CM

## 2015-03-12 LAB — MPO/PR-3 (ANCA) ANTIBODIES: Myeloperoxidase Abs: <9 U/mL (ref 0.0–9.0)

## 2015-03-12 LAB — CYCLIC CITRUL PEPTIDE ANTIBODY, IGG/IGA: CCP Antibodies IgG/IgA: 13 units (ref 0–19)

## 2015-03-12 LAB — COMPREHENSIVE METABOLIC PANEL
ALBUMIN: 2.2 g/dL — AB (ref 3.5–5.0)
ALK PHOS: 50 U/L (ref 38–126)
ALT: 32 U/L (ref 17–63)
AST: 19 U/L (ref 15–41)
Anion gap: 12 (ref 5–15)
BILIRUBIN TOTAL: 0.3 mg/dL (ref 0.3–1.2)
BUN: 12 mg/dL (ref 6–20)
CALCIUM: 8.7 mg/dL — AB (ref 8.9–10.3)
CO2: 30 mmol/L (ref 22–32)
CREATININE: 0.86 mg/dL (ref 0.61–1.24)
Chloride: 98 mmol/L — ABNORMAL LOW (ref 101–111)
GFR calc Af Amer: >60 mL/min (ref >60)
GFR calc non Af Amer: >60 mL/min (ref >60)
GLUCOSE: 94 mg/dL (ref 65–99)
Potassium: 3.2 mmol/L — ABNORMAL LOW (ref 3.5–5.1)
SODIUM: 140 mmol/L (ref 135–145)
TOTAL PROTEIN: 4.9 g/dL — AB (ref 6.5–8.1)

## 2015-03-12 LAB — CBC WITH DIFFERENTIAL/PLATELET
BASOS ABS: 0 10*3/uL (ref 0.0–0.1)
Basophils Relative: 0 %
Eosinophils Absolute: 0.1 10*3/uL (ref 0.0–0.7)
Eosinophils Relative: 1 %
HEMATOCRIT: 34.5 % — AB (ref 39.0–52.0)
HEMOGLOBIN: 10.2 g/dL — AB (ref 13.0–17.0)
LYMPHS ABS: 2.6 10*3/uL (ref 0.7–4.0)
Lymphocytes Relative: 23 %
MCH: 22.8 pg — ABNORMAL LOW (ref 26.0–34.0)
MCHC: 29.6 g/dL — AB (ref 30.0–36.0)
MCV: 77.2 fL — ABNORMAL LOW (ref 78.0–100.0)
MONOS PCT: 7 %
Monocytes Absolute: 0.8 10*3/uL (ref 0.1–1.0)
NEUTROS ABS: 7.7 10*3/uL (ref 1.7–7.7)
Neutrophils Relative %: 69 %
Platelets: 146 10*3/uL — ABNORMAL LOW (ref 150–400)
RBC: 4.47 MIL/uL (ref 4.22–5.81)
RDW: 24 % — ABNORMAL HIGH (ref 11.5–15.5)
WBC: 11.2 10*3/uL — ABNORMAL HIGH (ref 4.0–10.5)

## 2015-03-12 LAB — SJOGRENS SYNDROME-B EXTRACTABLE NUCLEAR ANTIBODY: SSB (La) (ENA) Antibody, IgG: <0.2 AI (ref 0.0–0.9)

## 2015-03-12 LAB — ANTI-SCLERODERMA ANTIBODY: Scleroderma (Scl-70) (ENA) Antibody, IgG: <0.2 AI (ref 0.0–0.9)

## 2015-03-12 LAB — ANCA TITERS

## 2015-03-12 LAB — ALDOLASE: Aldolase: 8.7 U/L (ref 3.3–10.3)

## 2015-03-12 LAB — RHEUMATOID FACTOR: Rhuematoid fact SerPl-aCnc: <10 IU/mL (ref 0.0–13.9)

## 2015-03-12 LAB — SJOGRENS SYNDROME-A EXTRACTABLE NUCLEAR ANTIBODY: SSA (Ro) (ENA) Antibody, IgG: <0.2 AI (ref 0.0–0.9)

## 2015-03-12 LAB — ANTI-DNA ANTIBODY, DOUBLE-STRANDED

## 2015-03-12 MED ORDER — POTASSIUM CHLORIDE CRYS ER 20 MEQ PO TBCR
20.0000 meq | EXTENDED_RELEASE_TABLET | Freq: Every day | ORAL | Status: DC
  Administered 2015-03-12: 20 meq via ORAL
  Filled 2015-03-12: qty 1

## 2015-03-12 NOTE — Progress Notes (Signed)
Pt. Has been voiding in the urinal after the foley was d/c.Pt. Off of enteric precautions as per infection prevention nurse orders.
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
## 2015-03-12 NOTE — Progress Notes (Signed)
Foley catheter was removed as per MD. Order.Pt. Tolerated fine.Keep assessing patient closely.

## 2015-03-12 NOTE — Progress Notes (Signed)
[facility] Individual Statement of Services  Patient Name:  Adam Benson  Date:  03/12/2015  Welcome to the [facility].  Our goal is to provide you with an individualized program based on your diagnosis and situation, designed to meet your specific needs.  With this comprehensive rehabilitation program, you will be expected to participate in at least 3 hours of rehabilitation therapies Monday-Friday, with modified therapy programming on the weekends.  Your rehabilitation program will include the following services:  Physical Therapy (PT), Occupational Therapy (OT), 24 hour per day rehabilitation nursing, Case Management (Social Worker), Rehabilitation Medicine, Nutrition Services and Pharmacy Services  Weekly team conferences will be held on Wednesdays  to discuss your progress.  Your Social Worker will talk with you frequently to get your input and to update you on team discussions.  Team conferences with you and your family in attendance may also be held.  Expected length of stay: 7 to 10 days  Overall anticipated outcome:  Modified Independent  Depending on your progress and recovery, your program may change. Your Social Worker will coordinate services and will keep you informed of any changes. Your Social Worker's name and contact numbers are listed  below.  The following services may also be recommended but are not provided by the [facility]:   Driving Evaluations  Home Health Rehabiltiation Services  Outpatient Rehabilitation Services   Arrangements will be made to provide these services after discharge if needed.  Arrangements include referral to agencies that provide these services.  Your insurance has been verified to be:  Medicaid Your primary doctor is:  Dr. Edgardo Roys O'Buch  Pertinent information will be shared with your doctor and your insurance company.  Social Worker:  Staci Acosta, LCSW  [phone] or ([phone]  Information discussed with and copy given to patient by: Elvera Lennox, 03/12/2015, 1:56 PM

## 2015-03-12 NOTE — Progress Notes (Signed)
VASCULAR LAB PRELIMINARY  PRELIMINARY  PRELIMINARY  PRELIMINARY  Bilateral lower extremity venous duplex completed.    Preliminary report:  There is no DVT or SVT noted in the bilateral lower extremities.   Ahmani Prehn, RVT 03/12/2015, 1:19 PM

## 2015-03-12 NOTE — Progress Notes (Signed)
Nutrition Brief Note  Patient identified on the Malnutrition Screening Tool (MST) Report  Wt Readings from Last 15 Encounters:  03/11/15 265 lb 6.4 oz (120.385 kg)  03/10/15 266 lb 15.6 oz (121.1 kg)  11/18/14 309 lb (140.161 kg)  10/21/14 302 lb 3.2 oz (137.077 kg)    Body mass index is 35.99 kg/(m^2). Patient meets criteria for class II obesity based on current BMI. Weight loss due to fluids.   Current diet order is regular, patient is consuming approximately 100% of meals at this time. Appetite reported to be good with no other difficulties. Labs and medications reviewed.   No nutrition interventions warranted at this time. If nutrition issues arise, please consult RD.   Roslyn Smiling, MS, RD, LDN Pager # [phone] After hours/ weekend pager # [phone]
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
## 2015-03-13 ENCOUNTER — Inpatient Hospital Stay (HOSPITAL_COMMUNITY): Payer: Medicaid Other | Admitting: Occupational Therapy

## 2015-03-13 ENCOUNTER — Inpatient Hospital Stay (HOSPITAL_COMMUNITY): Payer: Medicaid Other

## 2015-03-13 ENCOUNTER — Inpatient Hospital Stay (HOSPITAL_COMMUNITY): Payer: Medicaid Other | Admitting: Physical Therapy

## 2015-03-13 DIAGNOSIS — G629 Polyneuropathy, unspecified: Secondary | ICD-10-CM

## 2015-03-13 LAB — BASIC METABOLIC PANEL
Anion gap: 10 (ref 5–15)
BUN: 11 mg/dL (ref 6–20)
CALCIUM: 8.4 mg/dL — AB (ref 8.9–10.3)
CHLORIDE: 98 mmol/L — AB (ref 101–111)
CO2: 30 mmol/L (ref 22–32)
CREATININE: 0.82 mg/dL (ref 0.61–1.24)
GFR calc non Af Amer: >60 mL/min (ref >60)
Glucose, Bld: 83 mg/dL (ref 65–99)
Potassium: 3.2 mmol/L — ABNORMAL LOW (ref 3.5–5.1)
SODIUM: 138 mmol/L (ref 135–145)

## 2015-03-13 MED ORDER — POTASSIUM CHLORIDE CRYS ER 20 MEQ PO TBCR
20.0000 meq | EXTENDED_RELEASE_TABLET | Freq: Two times a day (BID) | ORAL | Status: DC
  Administered 2015-03-13 – 2015-03-20 (×15): 20 meq via ORAL
  Filled 2015-03-13 (×15): qty 1

## 2015-03-13 NOTE — Progress Notes (Signed)
Occupational Therapy Session Note  Patient Details  Name: Adam Benson MRN: [id number] Date of Birth: May 26, 1961  Today's Date: 03/13/2015 OT Individual Time: 1105-1200 OT Individual Time Calculation (min): 55 min    Short Term Goals: Week 1:  OT Short Term Goal 1 (Week 1): STG=LTG d/t anticipated short LOS   Skilled Therapeutic Interventions/Progress Updates: ADL-retraining with focus on energy conservation, pursed-lip and diaphragmatic breathing skills, and effective use of DME.   Pt received seated in w/c and receptive for bathing with setup to provide water barriers as needed.   Pt ambulates to bathroom with standby assist and transfers to tub bench with steadying assist for safety.   Pt able to bathe himself unassisted although requiring cues to rest d/t low 02 sats, checked intermittently with range varying from 76% while standing to wash buttocks to 93% seated, 02 increased to 3 l/min to recover.  Pt reports no symptoms of dyspnea although HR and rate of respirations increase significantly.    Pt dressed seated in w/c and reports awareness of increased and unexpected SOB during BADL however he remains convinced that pushing himself will result in improved endurance.   OT educates pt on energy conservation and influence of standing over sitting.   Pt grooms seated in w/c at end of session with all needs within reach.     Therapy Documentation Precautions:  Precautions Precautions: Fall Precaution Comments: trach, watch sats and HR  Restrictions Weight Bearing Restrictions: No  Vital Signs: Therapy Vitals Temp: 97.9 F (36.6 C) Temp Source: Oral Pulse Rate: 100 Resp: 18 BP: 118/70 mmHg Patient Position (if appropriate): Sitting Oxygen Therapy SpO2: 97 % O2 Device: Not Delivered O2 Flow Rate (L/min): 2 L/min   Pain: No/denies pain   ADL: ADL ADL Comments: see Functional  Assessment   See Function Navigator for Current Functional Status.   Therapy/Group: Individual  Therapy   Second session: Time: 1310-1415 Time Calculation (min):  65 min  Pain Assessment: 5/10, right hand pain, Meds.as needed (pt deferred request for meds until after tx)  Skilled Therapeutic Interventions: Therapeutic exercise (15 min) with emphasis on improved activity tolerance, awareness of energy expenditure while exerting using just arms vs legs/back.   Pt performed 10 min of continuous UE ergometry (Sci-Fit) at level 1 with supervision with supplemental 02 at 2 l/min.   Per pulse oxymetry, 02 sats = 90%, HR 108.   Pt appreciates educational session and progressing to next activity.    Therapeutic activity (50 min) with focus on kitchen safety while preparing simple meal: Jello.   Pt uses stove top and stool to prepare meal, standing only briefly to retreive box of jello.   Pt reports kitchen design of his home accomodates use of stool he owns and he has rearranged storage to improve ease of use already.   Pt prepares meal although reverting to prior habit of lifting stool or sliding it in kitchen versus use w/c or RW as recommended.   Pt then receives initial instruction on balance assessment using Ninetendo Wii and Wii Fit program.   Pt is unable to step on on platform as required to perform trial use and assessment this date.   See FIM for current functional status  Therapy/Group: Individual Therapy  Kaedon Fanelli 03/13/2015, 2:28 PM

## 2015-03-13 NOTE — Progress Notes (Signed)
Occupational Therapy Session Note  Patient Details  Name: Maciej Dillow MRN: [id number] Date of Birth: Jan 14, 1961  Today's Date: 03/13/2015 OT Individual Time:1630-1700  (30 min)  OT Individual Time Calculation (min): 30 min    Short Term Goals: Week 1:  OT Short Term Goal 1 (Week 1): STG=LTG d/t anticipated short LOS      Skilled Therapeutic Interventions/Progress Updates:    Pt. Reported being very tired from therapy sessions.  Agreed to work on UE/LE.  Did sit to stand x.2.  Did stepping forward and backward.  Performed UE exercises in sitting in all quadrants.   Did LE for 1 min. Intervals.  O2=  2 liters.  O2 sats went from 100 % to 82 through out the session.  Educated pt on diaphragmatic breathing   Pt not receptive to information.  He stated he knew all that.  Completed exercises and placed supper tray in front of pt with all needs in reach.    Therapy Documentation Precautions:  Precautions Precautions: Fall Precaution Comments: trach, watch sats and HR  Restrictions Weight Bearing Restrictions: No      Pain:  None reported   ADL: ADL ADL Comments: see Functional  Assessment  Exercises:  See above     See Function Navigator for Current Functional Status.   Therapy/Group: Individual Therapy  Humberto Seals 03/13/2015, 5:03 PM

## 2015-03-13 NOTE — Plan of Care (Signed)
Problem: RH PAIN MANAGEMENT Goal: RH STG PAIN MANAGED AT OR BELOW PT'S PAIN GOAL Less than 3,on scale 1 to 10  Outcome: Not Progressing Reports pain as 6

## 2015-03-13 NOTE — Progress Notes (Signed)
Patient information reviewed and entered into eRehab system by Allyana Vogan, RN, CRRN, PPS Coordinator.  Information including medical coding and functional independence measure will be reviewed and updated through discharge.    
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
## 2015-03-13 NOTE — Plan of Care (Signed)
Problem: RH BOWEL ELIMINATION Goal: RH STG MANAGE BOWEL WITH ASSISTANCE STG Manage Bowel with Assistance. Mod I Outcome: Progressing No incontinent episode reported     
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
## 2015-03-13 NOTE — Progress Notes (Signed)
Physical Therapy Session Note  Patient Details  Name: Adam Benson MRN: [id number] Date of Birth: [birth year]  Today's Date: 03/13/2015 PT Individual Time: 0830-0930 PT Individual Time Calculation (min): 60 min   Short Term Goals: Week 1:  PT Short Term Goal 1 (Week 1): STGs = LTGs due to ELOS  Skilled Therapeutic Interventions/Progress Updates:    Pt received in bed, finishing up getting his PIC line changed. Therapeutic Activity - Pt completes bed mobility Independent from flat bed without rails, then transfers bed to w/c with bedrail and armrest assist req SBA for safety.  Pt rolled dependently to gym in w/c for energy conservation. Neuromuscular Reeducation - PT administers Berg Balance Test with significant rest breaks in between every 1 - 2 subsets due to oxygen desaturation with minimal activity. Pt initially on 2 L O2/min, but bumped up to 3 then 4 L O2/min to assist in recovery with balance activity. Pt scores 27/56 points on Berg Balance Test, indicating high falls risk and need for an assistive device/handhold with all functional mobility - this was explained to pt who is in agreement. Gait Training - PT shows pt a [id number] with seat, explains how to use the brakes, and instructs pt to roll RW into wall or other stable surface prior to locking and sitting in order to reduce risk of 4WW skidding or tipping backwards when sitting/standing from seat. PT instructs pt in ambulation on uneven surface with 4WW while PT manages portable oxygen x 10' req CGA for safety - no LOB req assist to correct. Pt ended up in w/c, resting with all needs in reach, transitioned back to wall oxygen on 2L/min. Continue per PT POC.   Therapy Documentation Precautions:  Precautions Precautions: Fall Precaution Comments: trach, watch sats and HR  Restrictions Weight Bearing Restrictions: No Vital Signs: Therapy Vitals Temp: 98 F (36.7 C) Temp Source: Oral Pulse Rate: 64 Resp: 17 BP: 120/77 mmHg Patient  Position (if appropriate): Lying Oxygen Therapy SpO2: 95 % Pain: Pain Assessment Pain Assessment: 0-10 Pain Score: 8  Pain Type: Chronic pain Pain Location: Hand Pain Orientation: Right Pain Descriptors / Indicators: Tingling Pain Onset: On-going Pain Intervention(s): Distraction;Emotional support Multiple Pain Sites: No  Balance: Balance Balance Assessed: Yes Standardized Balance Assessment Standardized Balance Assessment: Berg Balance Test Berg Balance Test Sit to Stand: Able to stand  independently using hands Standing Unsupported: Able to stand 30 seconds unsupported (pt desaturates to 85% after 30 sec and to 80% after 1 minute - then instructed to sit and rest) Sitting with Back Unsupported but Feet Supported on Floor or Stool: Able to sit safely and securely 2 minutes Stand to Sit: Sits independently, has uncontrolled descent Transfers: Able to transfer with verbal cueing and /or supervision Standing Unsupported with Eyes Closed: Able to stand 10 seconds with supervision Standing Ubsupported with Feet Together: Needs help to attain position but able to stand for 30 seconds with feet together From Standing, Reach Forward with Outstretched Arm: Reaches forward but needs supervision From Standing Position, Pick up Object from Floor: Able to pick up shoe, needs supervision From Standing Position, Turn to Look Behind Over each Shoulder: Looks behind from both sides and weight shifts well Turn 360 Degrees: Needs close supervision or verbal cueing Standing Unsupported, Alternately Place Feet on Step/Stool: Needs assistance to keep from falling or unable to try (when standing on R leg, assisted to sit on mat due to LOB) Standing Unsupported, One Foot in Front: Able to take small step independently and  hold 30 seconds Standing on One Leg: Unable to try or needs assist to prevent fall Total Score: 27   See Function Navigator for Current Functional Status.   Therapy/Group:  Individual Therapy  Allyna Pittsley M 03/13/2015, 9:00 AM
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
## 2015-03-13 NOTE — Plan of Care (Signed)
Problem: RH BLADDER ELIMINATION Goal: RH STG MANAGE BLADDER WITH ASSISTANCE STG Manage Bladder With Assistance. Mod I  Outcome: Progressing No incontinent episode reported     

## 2015-03-14 ENCOUNTER — Inpatient Hospital Stay (HOSPITAL_COMMUNITY): Payer: Self-pay | Admitting: Physical Therapy

## 2015-03-14 DIAGNOSIS — G589 Mononeuropathy, unspecified: Secondary | ICD-10-CM

## 2015-03-14 NOTE — Plan of Care (Signed)
Problem: RH PAIN MANAGEMENT Goal: RH STG PAIN MANAGED AT OR BELOW PT'S PAIN GOAL Less than 3,on scale 1 to 10  Outcome: Not Progressing Per patient his pain stays 7/10 even with prn pain medication.

## 2015-03-14 NOTE — Progress Notes (Signed)
Physical Therapy Session Note  Patient Details  Name: Adam Benson MRN: [id number] Date of Birth: November 18, 1960  Today's Date: 03/14/2015 PT Individual Time: 1100-1200 PT Individual Time Calculation (min): 60 min   Short Term Goals: Week 1:  PT Short Term Goal 1 (Week 1): STGs = LTGs due to ELOS  Skilled Therapeutic Interventions/Progress Updates:    Pt received in bed, agreeable to PT. Gait Training - PT instructs pt in safe use of 4WW including parking against a wall and brakes management and pt demonstrates back understanding. Interval training practiced with ambulation for safety and to improve pt's cardiorespiratory endurance - see function tab for details. Pt continues to req cues to stop and sit when PT notes DOE - pt feels as if he can continue ambulating and when vitals measured at immediate seated rest break, PT notes that it is imperative he self monitor with shortness of breath symptom or use his personal pulse oximeter to ensure he does not overly stress his heart/lungs at home. Pt verbalizes understanding. PT sets up 4" platform step for stair training with RW in order to simulate pt's home entry. PT explains that pt is to bump 4WW up onto step, then lock brakes to step up, then unlock and roll 4WW off step, then lock brakes to step off - SpO2 drops to mid-80's with HR increasing to 130's req multiple minute seated rest break to recover on 4 L O2/min. Stair training practiced x 2 reps. Therapeutic Exercise - PT places pt on nu-step and instructs pt in all 4 extremity strengthening and endurance training at L5 decreasing to L4 in order to maintain SpO2 in mid-80's and pt reports comfort continuing to workout at this intensity x 10 minutes. Pt ended up in recliner in room with all needs in reach transitioned back to 2 L O2/min. Continue per PT POC.   Therapy Documentation Precautions:  Precautions Precautions: Fall Precaution Comments: trach, watch sats and HR  Restrictions Weight  Bearing Restrictions: No Vital Signs: Pt's SpO2 drops to low 70% with ambulation even after supplemental oxygen increased to 4 L/min. HR increases up to 162 bpm with gait. Pt req multiple minute seated rest break for SpO2 to return to >92% on 4 L/min and HR to drop to the low 90's or less.  Pain: Pain Assessment Pain Assessment: 0-10 Pain Score: 9  Pain Type: Chronic pain Pain Location: Hand Pain Orientation: Right Pain Descriptors / Indicators: Throbbing;Pins and needles Pain Onset: On-going Pain Intervention(s): Rest;Emotional support Multiple Pain Sites: No   See Function Navigator for Current Functional Status.   Therapy/Group: Individual Therapy  Koa Palla M 03/14/2015, 11:15 AM
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
## 2015-03-15 ENCOUNTER — Inpatient Hospital Stay (HOSPITAL_COMMUNITY): Payer: Medicaid Other | Admitting: Occupational Therapy

## 2015-03-15 ENCOUNTER — Inpatient Hospital Stay (HOSPITAL_COMMUNITY): Payer: Medicaid Other | Admitting: Physical Therapy

## 2015-03-15 DIAGNOSIS — G6289 Other specified polyneuropathies: Secondary | ICD-10-CM

## 2015-03-15 DIAGNOSIS — G629 Polyneuropathy, unspecified: Secondary | ICD-10-CM

## 2015-03-15 NOTE — Progress Notes (Signed)
Physical Therapy Session Note  Patient Details  Name: Adam Benson MRN: [id number] Date of Birth: 12/10/1960  Today's Date: 03/15/2015 PT Individual Time: 1300-1425 PT Individual Time Calculation (min): 85 min   Short Term Goals: Week 1:  PT Short Term Goal 1 (Week 1): STGs = LTGs due to ELOS  Skilled Therapeutic Interventions/Progress Updates:   Patient on 3-4 L 02 via [place] throughout session with Sp02 and HR monitored following activity, patient desaturates into 70-80s with exercise and recovers >90% in 1-2 minutes after seated rest. Patient ambulated to gym x 150 ft using rollator with one prolonged seated rest break. Patient negotiated up/down curb step x 2 using rollator with supervision, no cues needed for safe sequencing. Standing BLE therex using rollator for UE support: marching x 20, knee flexion x 20, heel raises 2 x 10, hip abduction 2 x 10 each LE. Sit <> stand from mat 2 x 5. Patient participated in 3 rounds of horseshoes standing with LUE support on rollator and tossing with RUE, supervision. Tolerated standing x 53 sec, 85 sec, and 80 sec. Gait in parallel bars without UE support per pt request x 10 ft with supervision, patient became SOB very quickly and required immediate seated rest. Seated on rollator, patient propelled backwards using BLE for functional strengthening to room and left sitting in recliner with all needs within reach. Patient required prolonged seated rest breaks with all functional mobility due to SOB and deconditioning.   Therapy Documentation Precautions:  Precautions Precautions: Fall Precaution Comments: trach, watch sats and HR  Restrictions Weight Bearing Restrictions: No Pain: Pain Assessment Pain Assessment: 0-10 Pain Score: 9  Pain Type: Chronic pain Pain Location: Hand Pain Orientation: Right Pain Descriptors / Indicators: Pounding;Pins and needles Pain Onset: On-going Pain Intervention(s): Emotional support   See Function Navigator for  Current Functional Status.   Therapy/Group: Individual Therapy  Kerney Elbe 03/15/2015, 2:30 PM

## 2015-03-15 NOTE — Progress Notes (Signed)
Occupational Therapy Session Note  Patient Details  Name: Adam Benson MRN: [id number] Date of Birth: 1960/06/30  Today's Date: 03/15/2015 OT Individual Time:  -   0900-1000  (60 min)  1st session      Short Term Goals: Week 1:  OT Short Term Goal 1 (Week 1): STG=LTG d/t anticipated short LOS :     Skilled Therapeutic Interventions/Progress Updates:     1st session:  Pt sitting in recliner.  He already bathed and dressed while sitting on the toilet. Ox =3 liters Ambulated in hallway for 100 feet; ox= 82%;  Put O2 on 4 liters..  Used nustep (3 wkload)  for . 02= 84%.  Educated on strengthening respiratory muscles by holding breath for 3 counts at end range or inhalation or exhalation for 3 times.  Pt repeatedly said " I am breathing."      Nustep for 4  Minutes, o2= 92% (BEST with pt practicing diaphragmatic breathing strategies with no verbal cues.)  Nustep 4 minutes, 02= 84 %. To 90 % after 1.5 minutes.  Did heel toe walking; ox= 77%; Recovery = 40  Sec.  0x2=  91%;  Did another heel toe walking   02=  78%;  Recovery=  2 min.30 Sec  Ox2= 92  %.  Ambulated back to room.  Ox=74 %; Recovery= 1  Min10 Sec   Ox=91 %. . Left in room with all needs in reach.    2nd session:  Time:1600-1700 Pain:  8/10  Right hand:    Pt on 4 liters of 02.   Pt ambulated with 4WW to gym.  One rest stop after 60 feet. O2=  74%, HR=  110; Recovered after 1.5 min;   Used nustep for 10 min; at 5 min 0x= 92%;     10 min. Ox=92%;  20 min 94%; ; 25 mins=91%  .  Ambulated 50 feet, 02= 84%, HR= 130.  Recovered after 1-2 min to 91%.  Propelled 4WW to room; Ox2= 91%.   Pt very pleased with Oxygen staying in 90's.          Therapy Documentation Precautions:  Precautions Precautions: Fall Precaution Comments: trach, watch sats and HR  Restrictions Weight Bearing Restrictions: No    Vital Signs:  See note above  1st and 2nd session  Pain: Pain Assessment Pain Assessment: 0-10 Pain Score: 8  Pain Type: Chronic  pain Pain Location: Hand Pain Orientation: Right Pain Descriptors / Indicators: Aching Pain Onset: On-going Pain Intervention(s): Medication (See eMAR) ADL: ADL ADL Comments: see Functional  Assessment      See Function Navigator for Current Functional Status.   Therapy/Group: Individual Therapy  Humberto Seals 03/15/2015, 7:48 AM

## 2015-03-15 NOTE — Progress Notes (Signed)
Subjective/Complaints: Slept ok No SOB at rest, only with exercise Lurline Idol has been capped for several days without uncapping Desats into 70-80s with exercise with recovery to >90% in less than 2 min   Review of Systems - Negative except right hand pain, fatigue  Objective: Vital Signs: Blood pressure 117/66, pulse 79, temperature 98 F (36.7 C), temperature source Oral, resp. rate 16, height 6' (1.829 m), weight 120.385 kg (265 lb 6.4 oz), SpO2 100 %. No results found. Results for orders placed or performed during the hospital encounter of 03/11/15 (from the past 72 hour(s))  Basic metabolic panel     Status: Abnormal   Collection Time: 03/13/15  5:05 AM  Result Value Ref Range   Sodium 138 135 - 145 mmol/L   Potassium 3.2 (L) 3.5 - 5.1 mmol/L   Chloride 98 (L) 101 - 111 mmol/L   CO2 30 22 - 32 mmol/L   Glucose, Bld 83 65 - 99 mg/dL   BUN 11 6 - 20 mg/dL   Creatinine, Ser 0.82 0.61 - 1.24 mg/dL   Calcium 8.4 (L) 8.9 - 10.3 mg/dL   GFR calc non Af Amer >60 >60 mL/min   GFR calc Af Amer >60 >60 mL/min    Comment: (NOTE) The eGFR has been calculated using the CKD EPI equation. This calculation has not been validated in all clinical situations. eGFR's persistently <60 mL/min signify possible Chronic Kidney Disease.    Anion gap 10 5 - 15     HEENT: extra long trach Cardio: RRR and no murmur Resp: CTA B/L and unlabored GI: BS positive and NT, ND Extremity:  Pulses positive and No Edema Skin:   Other trach site clean Neuro: Alert/Oriented, Normal Sensory, Abnormal Motor 4/5 bilateral delt , bi, tri, 4- grip, 4/5 bilateral HF, KE, ADF, 3- toe flex and ext and Other intrinsic atrophy RUE> LUE, and Bilateral LE Musc/Skelhallux valgus and hammertoesOther hallux valgus and hammertoes gen NAD   Assessment/Plan: 1. Functional deficits secondary to debilitation related to acute on chronic respiratory failure/sepsis multi-medical which require 3+ hours per day of interdisciplinary  therapy in a comprehensive inpatient rehab setting. Physiatrist is providing close team supervision and 24 hour management of active medical problems listed below. Physiatrist and rehab team continue to assess barriers to discharge/monitor patient progress toward functional and medical goals. FIM: Function - Bathing Position: Shower Body parts bathed by patient: Right arm, Left arm, Chest, Abdomen, Front perineal area, Buttocks, Right upper leg, Left upper leg, Right lower leg, Left lower leg Bathing not applicable: Back Assist Level: Supervision or verbal cues Set up : To obtain items  Function- Upper Body Dressing/Undressing What is the patient wearing?: Pull over shirt/dress Pull over shirt/dress - Perfomed by patient: Thread/unthread right sleeve, Thread/unthread left sleeve, Pull shirt over trunk Pull over shirt/dress - Perfomed by helper: Put head through opening Assist Level: Touching or steadying assistance(Pt > 75%) Function - Lower Body Dressing/Undressing What is the patient wearing?: Pants, Non-skid slipper socks Position: Wheelchair/chair at sink Pants- Performed by patient: Thread/unthread right pants leg, Thread/unthread left pants leg, Pull pants up/down, Fasten/unfasten pants Non-skid slipper socks- Performed by patient: Don/doff right sock, Don/doff left sock Assist for footwear: Setup Assist for lower body dressing: Set up     Function - Air cabin crew transfer assistive device: Walker, Grab bar Assist level to toilet: Touching or steadying assistance (Pt > 75%) Assist level from toilet: Touching or steadying assistance (Pt > 75%)  Function - Chair/bed transfer Chair/bed transfer  method: Ambulatory Chair/bed transfer assist level: Supervision or verbal cues Chair/bed transfer assistive device: Walker Chair/bed transfer details: Verbal cues for safe use of DME/AE  Function - Locomotion: Wheelchair Will patient use wheelchair at discharge?:   (tbd) Type: Manual Max wheelchair distance: 4' Assist Level: Dependent (Pt equals 0%) Wheel 50 feet with 2 turns activity did not occur: Safety/medical concerns Wheel 150 feet activity did not occur: Safety/medical concerns Turns around,maneuvers to table,bed, and toilet,negotiates 3% grade,maneuvers on rugs and over doorsills: No Function - Locomotion: Ambulation Assistive device: Walker-rolling Max distance: 57' + 8' + 60' Assist level: Supervision or verbal cues Assist level: Supervision or verbal cues Walk 50 feet with 2 turns activity did not occur: Safety/medical concerns Assist level: Supervision or verbal cues Walk 150 feet activity did not occur: Safety/medical concerns Walk 10 feet on uneven surfaces activity did not occur: Safety/medical concerns Assist level: Touching or steadying assistance (Pt > 75%)  Function - Comprehension Comprehension: Auditory Comprehension assist level: Follows basic conversation/direction with extra time/assistive device  Function - Expression Expression: Verbal Expression assist level: Expresses basic needs/ideas: With extra time/assistive device  Function - Social Interaction Social Interaction assist level: Interacts appropriately 90% of the time - Needs monitoring or encouragement for participation or interaction.  Function - Problem Solving Problem solving assist level: Solves complex 90% of the time/cues < 10% of the time  Function - Memory Memory assist level: Recognizes or recalls 90% of the time/requires cueing < 10% of the time Patient normally able to recall (first 3 days only): Current season, Location of own room, Staff names and faces, That he or she is in a hospital  Medical Problem List and Plan: 1. Functional deficits secondary to debilitation/sepsis related to acute on chronic respiratory failure/multi-medical 2.  DVT Prophylaxis/Anticoagulation: SCDs. Negative  vascular study 3. Pain Management: Lyrica 200 mg 3 times a  day, fentanyl patch 25 g, hydrocodone as needed. Main issue neuropathic pain in RUE, improving on hi dose Lyrica, should be able to d/c Fentanyl next week 4. Tracheostomy tube 02/17/2015. Tube change to extra long after becoming dislodged 02/18/2015. Continue capping trial. Will consult pulm next week re poss decannulation, desats with exercise but improves with rest 5. Neuropsych: This patient is capable of making decisions on his own behalf. 6. Skin/Wound Care: Routine skin checks 7. Fluids/Electrolytes/Nutrition: Routine I&O with follow-up chemistries, 100% meal intake, good fluid intake             Hypokalemia: Will supplement and monitor periodically 8. A. fib with RVR. Cardizem 90 mg 3 times a day. Cardiac rate control, 79 bpm this am 9. Acute diastolic congestive heart failure. Lasix 40 mg daily. Monitor for any signs of fluid overload 10. Tachycardia: HR ok today, exam with reg rythmn, was bradycardic last noc-monitor 11. 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  Urinary retention, voiding without ICP 13.  Hypokalemia-mild will supplement LOS (Days) 4 A FACE TO FACE EVALUATION WAS PERFORMED  Beverlee Wilmarth E 03/15/2015, 8:06 AM

## 2015-03-16 ENCOUNTER — Inpatient Hospital Stay (HOSPITAL_COMMUNITY): Payer: Medicaid Other | Admitting: Occupational Therapy

## 2015-03-16 ENCOUNTER — Inpatient Hospital Stay (HOSPITAL_COMMUNITY): Payer: Medicaid Other

## 2015-03-16 DIAGNOSIS — R609 Edema, unspecified: Secondary | ICD-10-CM | POA: Insufficient documentation

## 2015-03-16 MED ORDER — CHLORHEXIDINE GLUCONATE 0.12 % MT SOLN
OROMUCOSAL | Status: AC
  Filled 2015-03-16: qty 15

## 2015-03-16 MED ORDER — CHLORHEXIDINE GLUCONATE 0.12 % MT SOLN
OROMUCOSAL | Status: AC
  Administered 2015-03-16: 15 mL via OROMUCOSAL
  Filled 2015-03-16: qty 15

## 2015-03-16 NOTE — Progress Notes (Signed)
Occupational Therapy Session Note  Patient Details  Name: Adam Benson MRN: [id number] Date of Birth: 08/24/1960  Today's Date: 03/16/2015 OT Individual Time: 0850-0930 OT Individual Time Calculation (min): 40 min (missed 20 min for MD tx time)   Short Term Goals: Week 1:  OT Short Term Goal 1 (Week 1): STG=LTG d/t anticipated short LOS  Skilled Therapeutic Interventions/Progress Updates:    Pt's Pulmonologist NP present with patient for first 20 min of session to remove his trach and review O2 level recommendations during activity. Pt seen this session for ADL retraining with a focus on activity tolerance. Pt's PICC line covered, O2 increased to 6L on portable tank, pt sat to EOB and used rollator in room to retreive clothing from drawers and ambulate to bathroom with distant S.  Once in bathroom, showered and dressed with mod I. He would take rest breaks as needed when his breathing became heavy.  Pt ambulated out to room to recliner with supervision. Changed to wall tank to 4L.  Pt opted to use rollator this am but stated he will use RW at home in the house. Pt will need to practice with RW in room and adl apt. Pt resting in recliner with all needs met.   Therapy Documentation Precautions:  Precautions Precautions: Fall Precaution Comments: trach, watch sats and HR  Restrictions Weight Bearing Restrictions: No     Vital Signs: Therapy Vitals Temp: 97.6 F (36.4 C) Temp Source: Oral Pulse Rate: 76 Resp: 19 BP: 117/72 mmHg Patient Position (if appropriate): Lying Oxygen Therapy SpO2: 95 % O2 Device: Nasal Cannula O2 Flow Rate (L/min): 2 L/min Pain: Pain Assessment Pain Assessment: No/denies pain Pain Score: Asleep Pain Type: Chronic pain Pain Location: Head Pain Orientation: Right Pain Descriptors / Indicators: Aching Pain Intervention(s): Medication (See eMAR) ADL: ADL ADL Comments: see Functional  Assessment   See Function Navigator for Current Functional  Status.   Therapy/Group: Individual Therapy  SAGUIER,JULIA 03/16/2015, 9:03 AM  

## 2015-03-16 NOTE — Progress Notes (Signed)
Subjective/Complaints: Blowing off rad trach cap with strong cough No SOB at rest, only with exercise Adam Benson has been capped for several days without uncapping Desats into 70-80s with exercise with recovery to >90% in less than 2 min   Review of Systems - Negative except right hand pain, fatigue  Objective: Vital Signs: Blood pressure 117/72, pulse 76, temperature 97.6 F (36.4 C), temperature source Oral, resp. rate 19, height 6' (1.829 m), weight 120.385 kg (265 lb 6.4 oz), SpO2 95 %. No results found. No results found for this or any previous visit (from the past 72 hour(s)).   HEENT: extra long trach Cardio: RRR and no murmur Resp: CTA B/L and unlabored GI: BS positive and NT, ND Extremity:  Pulses positive and No Edema Skin:   Other trach site clean Neuro: Alert/Oriented, Normal Sensory, Abnormal Motor 4/5 bilateral delt , bi, tri, 4- grip, 4/5 bilateral HF, KE, ADF, 3- toe flex and ext and Other intrinsic atrophy RUE> LUE, and Bilateral LE Musc/Skelhallux valgus and hammertoesOther hallux valgus and hammertoes gen NAD   Assessment/Plan: 1. Functional deficits secondary to debilitation related to acute on chronic respiratory failure/sepsis multi-medical which require 3+ hours per day of interdisciplinary therapy in a comprehensive inpatient rehab setting. Physiatrist is providing close team supervision and 24 hour management of active medical problems listed below. Physiatrist and rehab team continue to assess barriers to discharge/monitor patient progress toward functional and medical goals. FIM: Function - Bathing Position: Shower Body parts bathed by patient: Right arm, Left arm, Chest, Abdomen, Front perineal area, Buttocks, Right upper leg, Left upper leg, Right lower leg, Left lower leg Bathing not applicable: Back Assist Level: Supervision or verbal cues Set up : To obtain items  Function- Upper Body Dressing/Undressing What is the patient wearing?: Pull over  shirt/dress Pull over shirt/dress - Perfomed by patient: Thread/unthread right sleeve, Thread/unthread left sleeve, Pull shirt over trunk Pull over shirt/dress - Perfomed by helper: Put head through opening Assist Level: Touching or steadying assistance(Pt > 75%) Function - Lower Body Dressing/Undressing What is the patient wearing?: Pants, Non-skid slipper socks Position: Wheelchair/chair at sink Pants- Performed by patient: Thread/unthread right pants leg, Thread/unthread left pants leg, Pull pants up/down, Fasten/unfasten pants Non-skid slipper socks- Performed by patient: Don/doff right sock, Don/doff left sock Assist for footwear: Setup Assist for lower body dressing: Set up  Function - Toileting Toileting steps completed by patient: Adjust clothing prior to toileting, Performs perineal hygiene, Adjust clothing after toileting Assist level: Supervision or verbal cues  Function - Archivist transfer assistive device: Grab bar, Walker Assist level to toilet: Touching or steadying assistance (Pt > 75%) Assist level from toilet: Touching or steadying assistance (Pt > 75%)  Function - Chair/bed transfer Chair/bed transfer method: Ambulatory Chair/bed transfer assist level: Supervision or verbal cues Chair/bed transfer assistive device: Walker Chair/bed transfer details: Verbal cues for safe use of DME/AE  Function - Locomotion: Wheelchair Will patient use wheelchair at discharge?:  (tbd) Type: Manual Max wheelchair distance: 45' Assist Level: Dependent (Pt equals 0%) Wheel 50 feet with 2 turns activity did not occur: Safety/medical concerns Wheel 150 feet activity did not occur: Safety/medical concerns Turns around,maneuvers to table,bed, and toilet,negotiates 3% grade,maneuvers on rugs and over doorsills: No Function - Locomotion: Ambulation Assistive device: Walker-rolling Max distance: 75 ft Assist level: Supervision or verbal cues Assist level: Supervision or  verbal cues Walk 50 feet with 2 turns activity did not occur: Safety/medical concerns Assist level: Supervision or verbal cues Walk 150  feet activity did not occur: Safety/medical concerns Walk 10 feet on uneven surfaces activity did not occur: Safety/medical concerns Assist level: Supervision or verbal cues  Function - Comprehension Comprehension: Auditory Comprehension assist level: Understands complex 90% of the time/cues 10% of the time  Function - Expression Expression: Verbal Expression assist level: Expresses complex 90% of the time/cues < 10% of the time  Function - Social Interaction Social Interaction assist level: Interacts appropriately with others with medication or extra time (anti-anxiety, antidepressant).  Function - Problem Solving Problem solving assist level: Solves complex problems: With extra time  Function - Memory Memory assist level: More than reasonable amount of time Patient normally able to recall (first 3 days only): Current season, Location of own room, Staff names and faces, That he or she is in a hospital  Medical Problem List and Plan: 1. Functional deficits secondary to debilitation/sepsis related to acute on chronic respiratory failure/multi-medical 2.  DVT Prophylaxis/Anticoagulation: SCDs. Negative  vascular study 3. Pain Management: Lyrica 200 mg 3 times a day, fentanyl patch 25 g, hydrocodone as needed. Main issue neuropathic pain in RUE, improving on hi dose Lyrica, should be able to wean Fentanyl  4. Tracheostomy tube 02/17/2015. Tube change to extra long after becoming dislodged 02/18/2015. Continue capping trial.Capped 24/7 for several days Will consult pulm  5. Neuropsych: This patient is capable of making decisions on his own behalf. 6. Skin/Wound Care: Routine skin checks 7. Fluids/Electrolytes/Nutrition: Routine I&O with follow-up chemistries, 100% meal intake, good fluid intake             Hypokalemia: Will supplement and monitor  periodically 8. A. fib with RVR. Cardizem 90 mg 3 times a day. Cardiac rate control, 76 bpm this am 9. Acute diastolic congestive heart failure. Lasix 40 mg daily. Monitor for any signs of fluid overload 10. Tachycardia: HR ok today, exam with reg rythmn, was bradycardic last noc-monitor 11. 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  Urinary retention, voiding without ICP 13.  Hypokalemia-mild will supplement LOS (Days) 5 A FACE TO FACE EVALUATION WAS PERFORMED  Adam Benson 03/16/2015, 7:39 AM

## 2015-03-16 NOTE — Progress Notes (Signed)
Redressed stoma.  SPO2 96% on 4L [place]

## 2015-03-16 NOTE — Consult Note (Signed)
Reason for consult: tracheostomy decannulation  Consulting MD: Kirsteins  HPI 54 yo male from [place] with dyspnea, wt gain, edema. Developed VDRF from PNA and ARDS, a fib with RVR. Intubated 9/4. Hospital high-lights: 9/09 FOB, Fever 103. 9/16 Repeat FOB. 9/20 trach placed, fib rvr. 9/21 leak, dislodged trach, changed to xtra long successful. 9/24 moved to ICU and placed on PCV. 10/2- to stepdown10/3 - continues to talk around cuff, anxious to get trach out. 10/3- loss of volumes = trach change, bleeding10/6 ATC x 6h , amio gtt for AF-RVR & hypotension. 10/11 up in chair. Adjusting O2. Starting capping trials w/ red cap 24/7. Focus on rehab, AF rate control and timing of decannulation. Moved to in-patient rehab 10/12. PCCM asked to see on 10/17. Has had red cap in place 24/7 since 10/11. His phonation is excellent. He has no significant cough and when he does he can cough the red cap off the trach. He is walking w/ oxygen. Currently able to walk approx 150 ft on 3 liters. During this he will desaturate to low 80s. He typically rests and is able to recover. Has had no new fever, cough, wheeze, CP, or negative change in his activity tolerance. He is expected to be discharged in the near future. We were asked to see Re: possible decannulation.   Past Medical History  Diagnosis Date  . Gout   . HTN (hypertension), benign   . Chronic atrial fibrillation (HCC)   . Hyperlipemia   . Nerve damage     right arm  . Obesity   . Insomnia   . GERD (gastroesophageal reflux disease)   . History of pneumonia 05/2014     two month hosp stay, vent, trach   . 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 Peptic ulcer 04/2014  . Pneumonia    Social History   Social History  . Marital Status: Divorced    Spouse Name: N/A  . Number of Children: N/A  . Years of Education: N/A   Occupational History  . Not on file.   Social History Main Topics  . Smoking  status: Never Smoker   . Smokeless tobacco: Current User    Types: Chew  . Alcohol Use: No  . Drug Use: Not on file  . Sexual Activity: Not on file   Other Topics Concern  . Not on file   Social History Narrative   Lives alone. Divorced. No known exposure to mold. No recent travel. No hot tub exposure.      Family History  Problem Relation Age of Onset  . Heart disease Mother   . Diabetes Mother   . Hypertension Mother    Allergies  Allergen Reactions  . Penicillins Hives and Shortness Of Breath  . Hydrochlorothiazide Other (See Comments)    hypercalemia   Current facility-administered medications:  .  acetaminophen (TYLENOL) tablet 650 mg, 650 mg, Oral, Q4H PRN, [name] Angiulli, PA-C .  antiseptic oral rinse solution (CORINZ), 7 mL, Mouth Rinse, QID, Ruby Logiudice J Angiulli, PA-C, 7 mL at 03/15/15 1449 .  chlorhexidine (PERIDEX) 0.12 % solution, , , ,  .  chlorhexidine gluconate (PERIDEX) 0.12 % solution 15 mL, 15 mL, Mouth Rinse, BID, [name] Angiulli, PA-C, 15 mL at 03/16/15 0821 .  diltiazem (CARDIZEM) tablet 90 mg, 90 mg, Oral, 3 times per day, [name] Angiulli, PA-C, 90 mg at 03/16/15 0550 .  famotidine (PEPCID) tablet 20 mg,  20 mg, Oral, BID, [name] Angiulli, PA-C, 20 mg at 03/16/15 0820 .  fentaNYL (DURAGESIC - dosed mcg/hr) 25 mcg, 25 mcg, Transdermal, Q48H, Silverio Hagan J Angiulli, PA-C, 25 mcg at 03/15/15 1011 .  furosemide (LASIX) tablet 40 mg, 40 mg, Oral, Q breakfast, [name] Angiulli, PA-C, 40 mg at 03/16/15 4098 .  ondansetron (ZOFRAN) tablet 4 mg, 4 mg, Oral, Q6H PRN **OR** ondansetron (ZOFRAN) injection 4 mg, 4 mg, Intravenous, Q6H PRN, [name] Angiulli, PA-C .  oxyCODONE (Oxy IR/ROXICODONE) immediate release tablet 10 mg, 10 mg, Oral, Q3H PRN, [name] Angiulli, PA-C, 10 mg at 03/16/15 0549 .  potassium chloride SA (K-DUR,KLOR-CON) CR tablet 20 mEq, 20 mEq, Oral, BID, Erick Colace, MD, 20 mEq at 03/16/15 1191 .  predniSONE (DELTASONE) tablet 20 mg, 20 mg,  Oral, Q breakfast, [name] Angiulli, PA-C, 20 mg at 03/16/15 4782 .  pregabalin (LYRICA) capsule 200 mg, 200 mg, Oral, TID, [name] Angiulli, PA-C, 200 mg at 03/16/15 9562 .  sodium chloride 0.9 % injection 10-40 mL, 10-40 mL, Intracatheter, PRN, Erick Colace, MD, 20 mL at 03/14/15 2200 .  sorbitol 70 % solution 30 mL, 30 mL, Oral, Daily PRN, [name] Angiulli, PA-C   ,Review of Systems:   Bolds are positive  Constitutional: weight loss, gain, night sweats, Fevers, chills, fatigue, but this and his activity tolerance continues to improve .  HEENT: headaches, Sore throat, sneezing, nasal congestion, post nasal drip, Difficulty swallowing, Tooth/dental problems, visual complaints visual changes, ear ache CV:  chest pain, radiates: ,Orthopnea, PND, swelling in lower extremities, dizziness, palpitations, syncope.  GI  heartburn, indigestion, abdominal pain, nausea, vomiting, diarrhea, change in bowel habits, loss of appetite, bloody stools.  Resp: cough, productive: , hemoptysis, dyspnea, w/ exertion, chest pain, pleuritic.  Skin: rash or itching or icterus GU: dysuria, change in color of urine, urgency or frequency. flank pain, hematuria  MS: joint pain or swelling. decreased range of motion  Psych: change in mood or affect. depression or anxiety.  Neuro: difficulty with speech, weakness, numbness, ataxia   BP 117/72 mmHg  Pulse 76  Temp(Src) 97.6 F (36.4 C) (Oral)  Resp 19  Ht 6' (1.829 m)  Wt 120.385 kg (265 lb 6.4 oz)  BMI 35.99 kg/m2  SpO2 95%  3 liters  General: 54 year old male. Currently resting in bed w/ trach capped on 3 liters O2. He is in no distress. His voice quality is strong and he is able to speak in complete sentences w/out evidence or c/o dyspnea.  HENT: NCAT. Trach (6 cuffed XLT) cuff is down. Red cap in place. No secretions. Stoma site unremarkable. No JVD Pulm: non-labored. Crackles posterior bases otherwise clear. No accessory muscle use.  Card: regular and  w/out murmur or gallop  Extremities: warm, strong pulses, no sig edema Abd: soft, no OM, + bowel sounds Neuro: awake, oriented, appropriate, moves all extremities w/out focal def.   Recent Labs Lab 03/11/15 0611 03/12/15 0450 03/13/15 0505  NA 134* 140 138  K 3.1* 3.2* 3.2*  CL 94* 98* 98*  CO2 BUN CREATININE 0.86 0.86 0.82  GLUCOSE 97 94 83    Recent Labs Lab 03/12/15 0450  HGB 10.2*  HCT 34.5*  WBC 11.2*  PLT 146*    No results found. Impression/Plan  Tracheostomy status s/p prolonged critical illness >he has made excellent progress. He has been capped since 10/11 and has tolerated this well. He  is protecting airway, voice quality and cough mechanics are excellent. He has passed capping trials and has now been decannulated  Plan -Keep occlusive dressing in place -Pt instructed on splinting stoma site during cough, Keeping site dry, and expected course of time for stoma to close.  -We will come back on 10/18 to re-assess.   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 Now w/ associated chronic respiratory failure.  >Dr [name] sent a complete auto-immune panel on 10/12 which was negative >he desaturates to low 80s w/ exertion on 3 liters.  Plan -Titrate O2 w/ goal to keep sats > 88%. Would increase O2 to 5 liters w/ activity to avoid chronic recurrent episodes of hypoxia and risk of secondary PAH.  -Avoiding amio - f/u w/ Ramaswamy after d/c   H/O Asthma. PLAN PRN SABA  A fib with RVR - CHA2DS2-VASc score 1. Acute Diastolic CHF - resolved. Plan Diltiazem to 90mg  tid ASA 325mg  daily. Cont PO lasix   Stage III CKD Stable Plan Cont current rx.   Deconditioning Plan Per CIR team   Simonne Martinet ACNP-BC [facility] Pulmonary/Critical Care Pager # [phone] OR # [phone] if no answer  STAFF NOTE: I, Rory Percy, MD FACP have personally reviewed patient's available data,  including medical history, events of note, physical examination and test results as part of my evaluation. I have discussed with resident/NP and other care providers such as pharmacist, RN and RRT. In addition, I personally evaluated patient and elicited key findings of: well known to our service, no distress, coughs sputum through mouth, CTA , good air entry, regardless of PMV success would decannulate today, cover and change dressing bid x 72 hr, avoid moisture / wetness on dressing, IS as able, no swimming for 3 weeks, any issues for trach closure, can follow up at trach clinic at [facility] / [address] [phone], i updated pt and father at bedside   [name]. Tyson Alias, MD, FACP Pgr: [phone] [place] 03/16/2015 2:16 PM

## 2015-03-16 NOTE — Progress Notes (Signed)
Occupational Therapy Session Note  Patient Details  Name: Adam Benson MRN: [id number] Date of Birth: 1961-04-19  Today's Date: 03/16/2015 OT Individual Time: 1100-1200 OT Individual Time Calculation (min): 60 min   Short Term Goals: Week 1:  OT Short Term Goal 1 (Week 1): STG=LTG d/t anticipated short LOS  Skilled Therapeutic Interventions/Progress Updates: Therapeutic exercise with focus on hand grip/pinch strengthening, pt ed on orthotics (LMB Finger Spring Splints) use of left small and ringer finger flexion contractures.   Pt completes 8 grip/pinch exercises using soft yellow putty with 1:1 instruction required with witting HEP provided.   Pt with good awareness of long-term effect of neuropathies resulting in loss of intrinsic hand muscle mass and strength relating to digital adduction and abduction.  Grip/Pinch assessed as follows using Jamar Hand dyno and pinch gage:  Grip:           R = 42 lbs   L= 27 Key Pinch: R = 10          L= 8 Tip Pinch   R = 11          L = 6    Therapy Documentation Precautions:  Precautions Precautions: Fall Precaution Comments: trach, watch sats and HR  Restrictions Weight Bearing Restrictions: No   Vital Signs: Therapy Vitals Pulse Rate: 89 Resp: 20 Patient Position (if appropriate): Sitting Oxygen Therapy SpO2: 96 % O2 Device: Nasal Cannula O2 Flow Rate (L/min): 4 L/min   Pain: Pain Assessment Pain Assessment: No/denies pain Pain Score: 9  Pain Type: Chronic pain Pain Location: Hand Pain Orientation: Right Pain Descriptors / Indicators: Throbbing Pain Onset: On-going Pain Intervention(s): Rest   ADL: ADL ADL Comments: see Functional  Assessment    Exercises: Hand Exercises Digit Composite Flexion: Seated;Other reps (comment);Other (comment) Composite Extension: Other reps (comment);Other (comment);Seated Opposition: Other reps (comment);Seated   See Function Navigator for Current Functional Status.   Therapy/Group:  Individual Therapy  Lavanda Nevels 03/16/2015, 12:40 PM

## 2015-03-16 NOTE — Plan of Care (Signed)
Problem: RH PAIN MANAGEMENT Goal: RH STG PAIN MANAGED AT OR BELOW PT'S PAIN GOAL Less than 5,on scale 1 to 10  Outcome: Not Progressing Reports pain as 9

## 2015-03-16 NOTE — Progress Notes (Signed)
Occupational Therapy Session Note  Patient Details  Name: Adam Benson MRN: [id number] Date of Birth: 1960-10-28  Today's Date: 03/16/2015 OT Individual Time: 5597-4163 OT Individual Time Calculation (min): 30 min    Short Term Goals: Week 1:  OT Short Term Goal 1 (Week 1): STG=LTG d/t anticipated short LOS  Skilled Therapeutic Interventions/Progress Updates:    Treatment session with focus on functional mobility and overall activity tolerance.  Pt reports fatigue but willing to participate in treatment session.  Ambulated with Rollator to therapy gym (~125 feet) with 1 seated rest break.  O2 sats 86% on 6L O2, returning to low 90s with <1 min seated rest.  Completed 15 mins on Nustep resistance level 3 with 1 rest break after 10 mins.  O2 sats and HR remained WNL during Nustep activity.  Pt returned to room as above, with O2 sats dropping to 84-86% and HR elevated to 140s requiring 1-2 mins rest break to normalize.    Therapy Documentation Precautions:  Precautions Precautions: Fall Precaution Comments: trach, watch sats and HR  Restrictions Weight Bearing Restrictions: No Pain:  Pt with no c/o pain  See Function Navigator for Current Functional Status.   Therapy/Group: Individual Therapy  Rosalio Loud 03/16/2015, 3:29 PM

## 2015-03-16 NOTE — Plan of Care (Signed)
Problem: RH BOWEL ELIMINATION Goal: RH STG MANAGE BOWEL WITH ASSISTANCE STG Manage Bowel with Assistance. Mod I Outcome: Progressing No incontinent episode reported     

## 2015-03-16 NOTE — Procedures (Signed)
Pt has has #6 cuffed XLT in place. Red capping trials since 10/11. Tolerated this well w/ excellent phonation, cough, and no complaints.  Trach removed w/out difficulty.  Occlusive dressing applied.  Pt tolerated well.   Simonne Martinet ACNP-BC [facility] Pulmonary/Critical Care Pager # [phone] OR # [phone] if no answer  Agree with above [name]. Tyson Alias, MD, FACP Pgr: [phone] [place] [facility]

## 2015-03-16 NOTE — Progress Notes (Signed)
Physical Therapy Session Note  Patient Details  Name: Adam Benson MRN: [id number] Date of Birth: 06-Sep-1960  Today's Date: 03/16/2015 PT Individual Time: 1002-1100 PT Individual Time Calculation (min): 58 min   Short Term Goals: Week 1:  PT Short Term Goal 1 (Week 1): STGs = LTGs due to ELOS  Skilled Therapeutic Interventions/Progress Updates:    Patient in recliner in room sit to stand supervision to ambulate to ADL apartment approx 80' supervision with rollator.  Seated rest with HR 96 SpO2 92 after about 2 min rest on 6L portable O2.  Ambulated in ADL apt no device with supervision to minguard wide based gait with trendelenberg pattern noted.  Patient seated rest with noted dyspnea SpO2 79% HR 144.  Back to 90% or greater in 1:10 rest.  Patient sit<>supine mod I in bed in ADL apt at standard height.  Patient ambulated to gym approx 51' with rollator supervision HR again 140's and SpO2 77%.  Seated rest on 6L SpO2 up to 92%in >1 min.  Patient performed standing hip abduction 2 x 10 w/ 3# weights seated rest in between due to vitals similar with activity.  Patient performed sit<>stand x 5 without UE support with elevated seat minguard assist and vitals again dipped into 70's SpO2 and HR up to 130's-140's.  Patient performed standing balance activity consisting of taps to targets alternating feet initially without UE support with minguard, then with supervision with UE support on walking pole.  Patient static balance with semi tandem stand to toss horseshoes. Seated for Nu Step x 8 min with UE /LE at level 4.  Patient ambulated back to room with one seated rest on rollator and supervision.    Therapy Documentation Precautions:  Precautions Precautions: Fall Precaution Comments: trach, watch sats and HR  Restrictions Weight Bearing Restrictions: No Vital Signs: Therapy Vitals Pulse Rate: 89 Resp: 20 Patient Position (if appropriate): Sitting Oxygen Therapy SpO2: 91 % O2 Device: Nasal  Cannula O2 Flow Rate (L/min): 4 L/min Pain: Pain Assessment Pain Assessment: No/denies pain Pain Score: 9  Pain Type: Chronic pain Pain Location: Hand Pain Orientation: Right Pain Descriptors / Indicators: Throbbing Pain Onset: On-going Pain Intervention(s): Rest   See Function Navigator for Current Functional Status.   Therapy/Group: Individual Therapy  Rudi Coco [place], [place] [phone] 03/16/2015  03/16/2015, 10:52 AM

## 2015-03-17 ENCOUNTER — Inpatient Hospital Stay (HOSPITAL_COMMUNITY): Payer: Self-pay

## 2015-03-17 ENCOUNTER — Inpatient Hospital Stay (HOSPITAL_COMMUNITY): Payer: Medicaid Other

## 2015-03-17 DIAGNOSIS — I482 Chronic atrial fibrillation: Secondary | ICD-10-CM

## 2015-03-17 DIAGNOSIS — R609 Edema, unspecified: Secondary | ICD-10-CM

## 2015-03-17 MED ORDER — FENTANYL 12 MCG/HR TD PT72
12.5000 ug | MEDICATED_PATCH | TRANSDERMAL | Status: AC
  Administered 2015-03-17 – 2015-03-19 (×2): 12.5 ug via TRANSDERMAL
  Filled 2015-03-17 (×2): qty 1

## 2015-03-17 NOTE — Progress Notes (Signed)
Late note: Stoma check/care done by RT. Site looks good, clean. RT redressed site with clean bandage. Pt remains stable on 2LNC. RT will continue to monitor.

## 2015-03-17 NOTE — Progress Notes (Signed)
Occupational Therapy Session Note  Patient Details  Name: Adam Benson MRN: [id number] Date of Birth: 12-25-1960  Today's Date: 03/17/2015 OT Individual Time: 1100-1200 OT Individual Time Calculation (min): 60 min    Short Term Goals: Week 1:  OT Short Term Goal 1 (Week 1): STG=LTG d/t anticipated short LOS  Skilled Therapeutic Interventions/Progress Updates: ADL-retraining (45 min) at shower level with focus on energy conservation, intellectual awareness, and effective use of DME.   Pt received seated in recliner with his father present.   Pt rises to rollator walker and ambulates to bathroom to shower stool with setup to manage 02 portable tank and provide materials.   Pt completes bathing unassisted, portable 02 at flow rate of 6L/min, and uses rollator to recover d/t decreased 02 sats (69%) after standing to wash buttocks and peri-area.   Pt recovers to 92% within 3 minutes and dresses at rollator after re-ed on pursed-lip breathing to aid recovery, reinforced by his father who also suffers from COPD.   Pt ambulates back to recliner, 02 sats decline only to 81%.   Therapeutic activity (15 min) with focus on pt ed on correct use of flutter valve, literature provided with step-by-step demonstration/teach back.   Pt also educated on ulnar nerve compression etiology d/t report of knowledge deficits relating to intrinsic hand weakness.   Literature provided on cubital tunnel and carpal tunnel syndromes.   Pt claim surgical procedures were completed w/o educational component provided.        Therapy Documentation Precautions:  Precautions Precautions: Fall Precaution Comments: trach, watch sats and HR  Restrictions Weight Bearing Restrictions: No  Pain: Pain Assessment Faces Pain Scale: Hurts a little bit Pain Type: Chronic pain Pain Location: Hand Pain Orientation: Right  ADL: ADL ADL Comments: see Functional  Assessment   See Function Navigator for Current Functional  Status.   Therapy/Group: Individual Therapy   Second session: Time: 1400-1430 Time Calculation (min):  30 min  Pain Assessment: 6/10, right hand Skilled Therapeutic Interventions: ADL-retraining (15 min) with focus on functional mobility using rollator, improved management of portable 02, toileting.  Pt received seated in recliner and requesting assist with toileting.   Pt educated on fall prevention and setup for mobility while using portable 02.   Pt ambulates to bathroom using RW with assist to follow with portable 02 tank.   Pt toliets and recovers to rollator to rest before returning to room for planned activity.  Pt desaturates to 81% and rests as OT prepares activity in room [employer] system).   Pt completes education on bowling task with requirement to stand when prompted for turn to bowl.   Pt enthusiastically completes 1 game sitting and standing from rollator walker 10 times, scoring 197 pts.   At end of game, 02 sats dropped to 72%, HR 110 bpm, Pt recovered to 81% on 5L/min rate from supplemental 02 within 2 minutes.   Pt transfers to recliner at end awaiting physical therapist with all needs within reach.   See FIM for current functional status  Therapy/Group: Individual Therapy  Adam Benson 03/17/2015, 12:34 PM

## 2015-03-17 NOTE — Consult Note (Signed)
Reason for consult: tracheostomy decannulation  Consulting MD: Kirsteins  HPI 54 yo male from [place] with dyspnea, wt gain, edema. Developed VDRF from PNA and ARDS, a fib with RVR. Intubated 9/4. Hospital high-lights: 9/09 FOB, Fever 103. 9/16 Repeat FOB. 9/20 trach placed, fib rvr. 9/21 leak, dislodged trach, changed to xtra long successful. 9/24 moved to ICU and placed on PCV. 10/2- to stepdown10/3 - continues to talk around cuff, anxious to get trach out. 10/3- loss of volumes = trach change, bleeding10/6 ATC x 6h , amio gtt for AF-RVR & hypotension. 10/11 up in chair. Adjusting O2. Starting capping trials w/ red cap 24/7. Focus on rehab, AF rate control and timing of decannulation. Moved to in-patient rehab 10/12. PCCM asked to see on 10/17. Has had red cap in place 24/7 since 10/11. His phonation is excellent. He has no significant cough and when he does he can cough the red cap off the trach. He is walking w/ oxygen. Currently able to walk approx 150 ft on 3 liters. During this he will desaturate to low 80s. He typically rests and is able to recover. Has had no new fever, cough, wheeze, CP, or negative change in his activity tolerance. He is expected to be discharged in the near future.  Trach decannulated yesterday. He is tolerating it well. Sats >95%.  Past Medical History  Diagnosis Date  . Gout   . HTN (hypertension), benign   . Chronic atrial fibrillation (HCC)   . Hyperlipemia   . Nerve damage     right arm  . Obesity   . Insomnia   . GERD (gastroesophageal reflux disease)   . History of pneumonia 05/2014     two month hosp stay, vent, trach   . 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 Peptic ulcer 04/2014  . Pneumonia    Social History   Social History  . Marital Status: Divorced    Spouse Name: N/A  . Number of Children: N/A  . Years of Education: N/A   Occupational History  . Not on file.   Social History Main  Topics  . Smoking status: Never Smoker   . Smokeless tobacco: Current User    Types: Chew  . Alcohol Use: No  . Drug Use: Not on file  . Sexual Activity: Not on file   Other Topics Concern  . Not on file   Social History Narrative   Lives alone. Divorced. No known exposure to mold. No recent travel. No hot tub exposure.      Family History  Problem Relation Age of Onset  . Heart disease Mother   . Diabetes Mother   . Hypertension Mother    Allergies  Allergen Reactions  . Penicillins Hives and Shortness Of Breath  . Hydrochlorothiazide Other (See Comments)    hypercalemia   Current facility-administered medications:  .  acetaminophen (TYLENOL) tablet 650 mg, 650 mg, Oral, Q4H PRN, [name] Angiulli, PA-C .  chlorhexidine gluconate (PERIDEX) 0.12 % solution 15 mL, 15 mL, Mouth Rinse, BID, Daniel J Angiulli, PA-C, 15 mL at 03/17/15 0857 .  diltiazem (CARDIZEM) tablet 90 mg, 90 mg, Oral, 3 times per day, Charlton Amor, PA-C, 90 mg at 03/17/15 0539 .  famotidine (PEPCID) tablet 20 mg, 20 mg, Oral, BID, [name] Angiulli, PA-C, 20 mg at 03/17/15 0857 .  fentaNYL (DURAGESIC - dosed mcg/hr) 12.5 mcg, 12.5 mcg, Transdermal, Q48H, Erick Colace, MD,  12.5 mcg at 03/17/15 1111 .  furosemide (LASIX) tablet 40 mg, 40 mg, Oral, Q breakfast, [name] Angiulli, PA-C, 40 mg at 03/17/15 0857 .  ondansetron (ZOFRAN) tablet 4 mg, 4 mg, Oral, Q6H PRN **OR** ondansetron (ZOFRAN) injection 4 mg, 4 mg, Intravenous, Q6H PRN, [name] Angiulli, PA-C .  oxyCODONE (Oxy IR/ROXICODONE) immediate release tablet 10 mg, 10 mg, Oral, Q3H PRN, [name] Angiulli, PA-C, 10 mg at 03/17/15 0857 .  potassium chloride SA (K-DUR,KLOR-CON) CR tablet 20 mEq, 20 mEq, Oral, BID, Erick Colace, MD, 20 mEq at 03/17/15 0857 .  predniSONE (DELTASONE) tablet 20 mg, 20 mg, Oral, Q breakfast, [name] Angiulli, PA-C, 20 mg at 03/17/15 0857 .  pregabalin (LYRICA) capsule 200 mg, 200 mg, Oral, TID, [name] Angiulli,  PA-C, 200 mg at 03/17/15 0857 .  sodium chloride 0.9 % injection 10-40 mL, 10-40 mL, Intracatheter, PRN, Erick Colace, MD, 10 mL at 03/16/15 1737 .  sorbitol 70 % solution 30 mL, 30 mL, Oral, Daily PRN, [name] Angiulli, PA-C     BP 112/67 mmHg  Pulse 74  Temp(Src) 97.8 F (36.6 C) (Oral)  Resp 16  Ht 6' (1.829 m)  Wt 265 lb 6.4 oz (120.385 kg)  BMI 35.99 kg/m2  SpO2 98%  3 liters  General: Awake, no distress. Sitting up in chair HENT: Dressing over trach site appears clean. Pulm: Non- Laboured. Clear antr. Card: RRR, No MRG Extremities: No edema Abd: Soft, + BS Neuro: No focal deficits.   Recent Labs Lab 03/11/15 0611 03/12/15 0450 03/13/15 0505  NA 134* 140 138  K 3.1* 3.2* 3.2*  CL 94* 98* 98*  CO2 BUN CREATININE 0.86 0.86 0.82  GLUCOSE 97 94 83    Recent Labs Lab 03/12/15 0450  HGB 10.2*  HCT 34.5*  WBC 11.2*  PLT 146*    No results found. Impression/Plan  Tracheostomy status s/p prolonged critical illness He has made excellent progress. He has been capped since 10/11 and decannulated yesterday.  Doing well post trach decannulation.  Plan -Keep occlusive dressing in place -Pt instructed on splinting stoma site during cough, Keeping site dry, and expected course of time for stoma to close.  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 Now w/ associated chronic respiratory failure.   Plan -Titrate O2 w/ goal to keep sats > 88%. Would increase O2 to 5 liters w/ activity to avoid chronic recurrent episodes of hypoxia and risk of secondary PAH.  -Avoiding amio - Follow up with Dr.Ramaswamy at [facility] after discharge. We will make the follow up appointment.   We will  sign off. Please call if any new issues arise  Chilton Greathouse MD [facility] Pager [phone] If no answer or after 3pm call: [phone] 03/17/2015, 12:18 PM

## 2015-03-17 NOTE — Progress Notes (Signed)
Came to assess pt, pt is stable at this time, in room sitting on chair watching tv and cutting his hair. No distress noted. No complications noted. Pt stated that he feels better with the trach out. Breath sounds are clear and diminished. RT will continue to monitor

## 2015-03-17 NOTE — Progress Notes (Signed)
Subjective/Complaints:  No problems overnite s/p trach removal, sats >95%  Review of Systems - Negative except right hand pain, fatigue  Objective: Vital Signs: Blood pressure 112/67, pulse 67, temperature 97.8 F (36.6 C), temperature source Oral, resp. rate 18, height 6' (1.829 m), weight 120.385 kg (265 lb 6.4 oz), SpO2 95 %. No results found. No results found for this or any previous visit (from the past 72 hour(s)).   HEENT: trach stoma with occlusive dressing Cardio: RRR and no murmur Resp: CTA B/L and unlabored GI: BS positive and NT, ND Extremity:  Pulses positive and No Edema Skin:   Other trach site clean Neuro: Alert/Oriented, Normal Sensory, Abnormal Motor 4/5 bilateral delt , bi, tri, 4- grip, 4/5 bilateral HF, KE, ADF, 3- toe flex and ext and Other intrinsic atrophy RUE> LUE, and Bilateral LE Musc/Skelhallux valgus and hammertoesOther hallux valgus and hammertoes gen NAD   Assessment/Plan: 1. Functional deficits secondary to debilitation related to acute on chronic respiratory failure/sepsis multi-medical which require 3+ hours per day of interdisciplinary therapy in a comprehensive inpatient rehab setting. Physiatrist is providing close team supervision and 24 hour management of active medical problems listed below. Physiatrist and rehab team continue to assess barriers to discharge/monitor patient progress toward functional and medical goals. FIM: Function - Bathing Position: Shower Body parts bathed by patient: Right arm, Left arm, Chest, Abdomen, Front perineal area, Buttocks, Right upper leg, Left upper leg, Right lower leg, Left lower leg Bathing not applicable: Back Assist Level: More than reasonable time Set up : To obtain items  Function- Upper Body Dressing/Undressing What is the patient wearing?: Pull over shirt/dress Pull over shirt/dress - Perfomed by patient: Thread/unthread right sleeve, Thread/unthread left sleeve, Pull shirt over trunk, Put head  through opening Pull over shirt/dress - Perfomed by helper: Put head through opening Assist Level: More than reasonable time Function - Lower Body Dressing/Undressing What is the patient wearing?: Pants, Non-skid slipper socks Position: Wheelchair/chair at sink Pants- Performed by patient: Thread/unthread right pants leg, Thread/unthread left pants leg, Pull pants up/down Non-skid slipper socks- Performed by patient: Don/doff right sock, Don/doff left sock Assist for footwear: Setup Assist for lower body dressing: More than reasonable time  Function - Toileting Toileting steps completed by patient: Adjust clothing prior to toileting, Performs perineal hygiene, Adjust clothing after toileting Assist level: Supervision or verbal cues  Function - Archivist transfer assistive device: Grab bar, Walker Assist level to toilet: Touching or steadying assistance (Pt > 75%) Assist level from toilet: Touching or steadying assistance (Pt > 75%)  Function - Chair/bed transfer Chair/bed transfer method: Ambulatory Chair/bed transfer assist level: Supervision or verbal cues Chair/bed transfer assistive device: Walker Chair/bed transfer details: Verbal cues for safe use of DME/AE  Function - Locomotion: Wheelchair Will patient use wheelchair at discharge?:  (tbd) Type: Manual Max wheelchair distance: 45' Assist Level: Dependent (Pt equals 0%) Wheel 50 feet with 2 turns activity did not occur: Safety/medical concerns Wheel 150 feet activity did not occur: Safety/medical concerns Turns around,maneuvers to table,bed, and toilet,negotiates 3% grade,maneuvers on rugs and over doorsills: No Function - Locomotion: Ambulation Assistive device: Walker-rolling, No device Max distance: 80 Assist level: Touching or steadying assistance (Pt > 75%) Assist level: Supervision or verbal cues Walk 50 feet with 2 turns activity did not occur: Safety/medical concerns Assist level: Touching or  steadying assistance (Pt > 75%) Walk 150 feet activity did not occur: Safety/medical concerns Walk 10 feet on uneven surfaces activity did not occur: Safety/medical concerns  Assist level: Supervision or verbal cues  Function - Comprehension Comprehension: Auditory Comprehension assist level: Follows complex conversation/direction with no assist  Function - Expression Expression: Verbal Expression assist level: Expresses complex 90% of the time/cues < 10% of the time  Function - Social Interaction Social Interaction assist level: Interacts appropriately with others - No medications needed.  Function - Problem Solving Problem solving assist level: Solves complex problems: With extra time  Function - Memory Memory assist level: More than reasonable amount of time Patient normally able to recall (first 3 days only): Current season, Location of own room, Staff names and faces, That he or she is in a hospital  Medical Problem List and Plan: 1. Functional deficits secondary to debilitation/sepsis related to acute on chronic respiratory failure/multi-medical 2.  DVT Prophylaxis/Anticoagulation: SCDs. Negative  vascular study 3. Pain Management: Lyrica 200 mg 3 times a day, fentanyl patch 25 g, hydrocodone as needed. Main issue neuropathic pain in RUE, improving on hi dose Lyrica, should be able to wean Fentanyl start today 4. Tracheostomy tube D/Ced on 10/17 5. Neuropsych: This patient is capable of making decisions on his own behalf. 6. Skin/Wound Care: Routine skin checks 7. Fluids/Electrolytes/Nutrition: Routine I&O with follow-up chemistries, 100% meal intake, good fluid intake             Hypokalemia: Will supplement and monitor periodically 8. A. fib with RVR. Cardizem 90 mg 3 times a day. Cardiac rate control, 76 bpm this am 9. Acute diastolic congestive heart failure. Lasix 40 mg daily. Monitor for any signs of fluid overload 10. Tachycardia: HR ok today, exam with reg rythmn, was  bradycardic last noc-monitor 11. 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  Urinary retention, voiding without ICP 13.  Hypokalemia-mild will supplement LOS (Days) 6 A FACE TO FACE EVALUATION WAS PERFORMED  KIRSTEINS,ANDREW E 03/17/2015, 6:54 AM

## 2015-03-17 NOTE — Progress Notes (Signed)
Physical Therapy Session Note  Patient Details  Name: Adam Benson MRN: [id number] Date of Birth: 05-15-1961  Today's Date: 03/17/2015 PT Individual Time: 1005-1105; 1440-1530 PT Individual Time Calculation (min): 60 min, 50 min   Short Term Goals: Week 1:  PT Short Term Goal 1 (Week 1): STGs = LTGs due to ELOS  Skilled Therapeutic Interventions/Progress Updates:   tx 1: O2 sats in sitting at rest 85% on 2 L.  After deep breathing x 1 minute, O2 sats 90%.  Gait x 135 ' to gym from room using 4WW,; pdeclined a seated rest for this distance.  On 6L O2 via [place], O2 sats 76, trending down to 72 upon resting, until pt had rested x 2 minutes, 91%. Gait x 90' including 2 turns .Up/down 4" high platform, 4WW,  to simulate home entry with threshold, with min guard assist first trail,  with supervision 2nd trial.  Pt slightly impulsive at all times, and not receptive to VCs to slow down or be safer.  Pt left hands on hand grips of 4WW several times, but did always remember to lock it appropriately.    NUStep for activity tolerance at level 4, rated 11 on BORG scales.  O2 sats after 4 minutes = 85%; rose to 90% in less than 1 minute rest.  Pt left resting in recliner with all needs within reach.  tx 2:  On 6L O2 via .  O2 sats dropped to mid 70s consistently during gait, but dropped to mid 80s during standing balance retraining, and recovered much more quickly this afternoon vs this morning, in less than 1 minute up to mid 90s..  Gait to/from gym with 1 seated rest break, without reminder from PT.  High level balance retraining with activities below.  Pt performed ankle exs below for improved push off R foot, and gait on uneven ground as per his home setting.  Pt left resting in recliner with all needs within reach. Therapy Documentation Precautions:  Precautions Precautions: Fall Precaution Comments: trach, watch sats and HR  Restrictions Weight Bearing Restrictions: No   Vital  Signs: Therapy Vitals Oxygen Therapy O2 Device: Nasal Cannula O2 Flow Rate (L/min): 6 L/min   Exercises: instructed pt in seated ankle PF and ankle circles needed to "draw" alphabet in air with either foot   Other Treatments: Treatments Therapeutic Activity: static standing on wedge and external perturbations to elicit balance strategies; standing on compliant [employer] mat during L><R and A><P weight shifts, and toe and calf raises; seated bil hip abd with core activation, and calf raises; in standing, reaching overhead with R hand requiring calf raise, x 5; squatting to retrieve papers from floor, x 3 without use of RW  See Function Navigator for Current Functional Status.   Therapy/Group: Individual Therapy  Lorrain Rivers 03/17/2015, 3:55 PM

## 2015-03-18 ENCOUNTER — Inpatient Hospital Stay (HOSPITAL_COMMUNITY): Payer: Self-pay | Admitting: Physical Therapy

## 2015-03-18 ENCOUNTER — Inpatient Hospital Stay (HOSPITAL_COMMUNITY): Payer: Self-pay | Admitting: Occupational Therapy

## 2015-03-18 ENCOUNTER — Inpatient Hospital Stay (HOSPITAL_COMMUNITY): Payer: Medicaid Other | Admitting: Physical Therapy

## 2015-03-18 DIAGNOSIS — Z9889 Other specified postprocedural states: Secondary | ICD-10-CM

## 2015-03-18 DIAGNOSIS — S143XXS Injury of brachial plexus, sequela: Secondary | ICD-10-CM

## 2015-03-18 NOTE — Progress Notes (Addendum)
Occupational Therapy Session Note  Patient Details  Name: Adam Benson MRN: [id number] Date of Birth: 1960-08-19  Today's Date: 03/18/2015 OT Individual Time: 1100-1225 OT Individual Time Calculation (min): 85 min    Short Term Goals: Week 1:  OT Short Term Goal 1 (Week 1): STG=LTG d/t anticipated short LOS  Skilled Therapeutic Interventions/Progress Updates:    1:1 focus of session on activity tolerance with functional mobility.  performed an obstacle course including navigating through narrow spaces, walking across uneven ground (on mat), and stepping over thresholds while management and steering Rollator.  Also went outside to navigate uneven surface outside. Pt continues to require frequent rest breaks due to desaturation and 3/4 -4//4 dyspnea. Did discuss and education on limiting driving due to slow reaction time and limited activity tolerance, how to manage IADLs (including making meals, and grocery shopping and excepting help.)  SATURATION QUALIFICATIONS: (This note is used to comply with regulatory documentation for home oxygen)  Patient Saturations on Room Air at Rest = unsafe- currently requires 3 liters at rest to maintain 97%  Patient Saturations on Room Air while Ambulating = unsafe at this time to trial%  Patient Saturations on 6 Liters of oxygen while Ambulating = 74-78%  Pt requires at least 2 full minutes of rest to recover from short distance functional ambulation after desating into 70s.  Pt can go 30 feet and desat. This data was true for each of the 7 trials throughout session.   Therapy Documentation Precautions:  Precautions Precautions: Fall Precaution Comments: trach, watch sats and HR  Restrictions Weight Bearing Restrictions: No Pain: No c/o pain in session  See Function Navigator for Current Functional Status.   Therapy/Group: Individual Therapy  [place], [facility] [place] 03/18/2015, 7:57 PM

## 2015-03-18 NOTE — Progress Notes (Addendum)
Physical Therapy Session Note  Patient Details  Name: Adam Benson MRN: [id number] Date of Birth: 12/04/1960  Today's Date: 03/18/2015 PT Individual Time: 2703-5009 PT Individual Time Calculation (min): 69 min   Short Term Goals: Week 1:  PT Short Term Goal 1 (Week 1): STGs = LTGs due to ELOS  Skilled Therapeutic Interventions/Progress Updates:   Pt received in recliner resting.  Discussed with pt possible LOS and purpose of conference to discuss D/C and pt readiness to D/C home.  Pt states he has practiced car transfers, threshold negotiation, functional household tasks including simple meal prep, household ambulation while managing 02 tubing and ambulation outdoors today.  Pt feels his endurance and breathing are his biggest barrier to D/C home.  Pt agreeable to performing standing balance and endurance exercises for this session.  Performed ambulation in controlled environment x 100' x 2 with rollator with supervision and one seated rest break to assess HR and Sp02; RN notified of irregular HR and desaturation.  Once 02 increased to >95% pt continued to gym with supervision.  Pt educated on and given handout of OTAGO standing LE strengthening and balance exercises; discussed safety, energy conservation and safe progression of exercises with pt.  Pt return demonstrate each exercise with bilat UE support and seated rest break in between each exercise for deep breathing and to return Sp02 to baseline.  Pt requesting to practice threshold negotiation with rollator with 02 carrier attached to front of rollator to see if he would be able to safely lift rollator onto step; pt performed one step negotiation with supervision overall.  Returned to room with one seated rest break and pt returned to recliner; pt returned to wall 02 at 3L once Sp02 returned to baseline.  Pt left with all items within reach.    SATURATION QUALIFICATIONS: (This note is used to comply with regulatory documentation for home  oxygen)  Patient Saturations on Room Air at Rest = unsafe on room air; 95% on 3L 02  Patient Saturations on Room Air while Ambulating = unsafe  Patient Saturations on 6 Liters of oxygen while Ambulating = 60%  Please briefly explain why patient needs home oxygen: Pt unable to maintain Sp02 >90% on room air at rest and during activity.    Therapy Documentation Precautions:  Precautions Precautions: Fall Precaution Comments: trach, watch sats and HR  Restrictions Weight Bearing Restrictions: No Vital Signs: Therapy Vitals Pulse Rate: 70 BP: 116/80 mmHg Patient Position (if appropriate): Lying Pain: Pain Assessment Pain Assessment: 0-10 Pain Score: 9  Pain Type: Chronic pain Pain Location: Wrist Pain Orientation: Left Pain Descriptors / Indicators: Throbbing Pain Frequency: Constant Pain Onset: On-going Patients Stated Pain Goal: 4 Pain Intervention(s): Medication (See eMAR)   See Function Navigator for Current Functional Status.   Therapy/Group: Individual Therapy  Edman Circle Faucette 03/18/2015, 3:17 PM

## 2015-03-18 NOTE — Progress Notes (Signed)
Physical Therapy Session Note  Patient Details  Name: Adam Benson MRN: [id number] Date of Birth: 11/17/1960  Today's Date: 03/18/2015 PT Individual Time: 6767-2094 PT Individual Time Calculation (min): 60 min   Short Term Goals: Week 1:  PT Short Term Goal 1 (Week 1): STGs = LTGs due to ELOS  Skilled Therapeutic Interventions/Progress Updates:    Pt received up in recliner on 3 L O2/min at rest - SpO2 at 98% and HR at 90. Pt transferred to portable oxygen at 6 L/min for increased activity. Gait Training - see function tab for details - continued cues to sit and rest as soon as shortness of breath noted. Throughout bouts of ambulation during PT, SpO2 drops to mid-70's or low -80's on 6 L O2/min req 2 minutes to rest and recover. Stair training - PT sets up 4" platform to simulate entry into home and pt ambulates up and over step and back (2 reps) immediately - SpO2 drops to mid-70's on 6 L/min. After rest break, PT instructs pt in a second rep, but his time only one step and SpO2 drops to high 70's. Therapeutic Activity - see function tab for details re: car and basic transfer. PT gives pt cues for technique to back up to car while using RW. PT and pt have discussion about using basket of 4WW to hold small portable oxygen with tube coming out of side so as not to compress when he needs a seated rest break. PT instructs pt in dynamic stand balance/therapeutic activity of standing, collapsing 4WW, and lifting up/down, then unfolding, locking, and sitting. SpO2 dropped to high-80's with this activity. PT progresses this activity by elevating a mat table to truck-bed height, having pt in standing, collapse 4WW, "load" it onto mat/simulated truck bed, then walk holding along edge of mat to simulate walking holding along edge of truck, and sitting on mat, as if sitting in front seat of truck. PT discusses energy conservation with pt: walk out of home up to truck, then sit and rest for a few minutes on seat of  4WW. Once recovered, then load the 4WW and sit in truck and rest to recover. After you unload the 4WW, lock it and sit on seat of 4WW to rest and recover before ambulating into new place. This activity req SBA for safety due to impaired dynamic standing balance, but no LOB req assist to correct. Pt ended up in recliner in room. Continue per PT POC.    Therapy Documentation Precautions:  Precautions Precautions: Fall Precaution Comments: trach, watch sats and HR  Restrictions Weight Bearing Restrictions: No Vital Signs:   See Narrative Pain: Pain Assessment Pain Assessment: No/denies pain Pain Score: 4  Pain Type: Chronic pain Pain Location: Arm Pain Orientation: Right Pain Descriptors / Indicators: Throbbing Pain Frequency: Constant Pain Intervention(s): Medication (See eMAR)   See Function Navigator for Current Functional Status.   Therapy/Group: Individual Therapy  Sadeel Fiddler M 03/18/2015, 10:01 AM

## 2015-03-18 NOTE — Progress Notes (Signed)
Subjective/Complaints:  Appreciate pulmonary note Some increased pain with lower fentanyl patch  Review of Systems - Negative except right hand pain, fatigue  Objective: Vital Signs: Blood pressure 106/65, pulse 85, temperature 97.8 F (36.6 C), temperature source Oral, resp. rate 18, height 6' (1.829 m), weight 120.385 kg (265 lb 6.4 oz), SpO2 95 %. No results found. No results found for this or any previous visit (from the past 72 hour(s)).   HEENT: trach stoma with occlusive dressing Cardio: RRR and no murmur Resp: CTA B/L and unlabored GI: BS positive and NT, ND Extremity:  Pulses positive and No Edema Skin:   Other trach site clean Neuro: Alert/Oriented, Normal Sensory, Abnormal Motor 4/5 bilateral delt , bi, tri, 4- grip, 4/5 bilateral HF, KE, ADF, 3- toe flex and ext and Other intrinsic atrophy RUE> LUE, and Bilateral LE Musc/Skelhallux valgus and hammertoesOther hallux valgus and hammertoes gen NAD   Assessment/Plan: 1. Functional deficits secondary to debilitation related to acute on chronic respiratory failure/sepsis multi-medical which require 3+ hours per day of interdisciplinary therapy in a comprehensive inpatient rehab setting. Physiatrist is providing close team supervision and 24 hour management of active medical problems listed below. Physiatrist and rehab team continue to assess barriers to discharge/monitor patient progress toward functional and medical goals. FIM: Function - Bathing Position: Shower Body parts bathed by patient: Right arm, Left arm, Chest, Abdomen, Front perineal area, Buttocks, Right upper leg, Left upper leg, Right lower leg, Left lower leg Bathing not applicable: Back Assist Level: More than reasonable time Set up : To obtain items  Function- Upper Body Dressing/Undressing What is the patient wearing?: Pull over shirt/dress Pull over shirt/dress - Perfomed by patient: Thread/unthread right sleeve, Thread/unthread left sleeve, Pull  shirt over trunk, Put head through opening Pull over shirt/dress - Perfomed by helper: Put head through opening Assist Level: More than reasonable time Function - Lower Body Dressing/Undressing What is the patient wearing?: Pants, Non-skid slipper socks Position: Wheelchair/chair at sink Pants- Performed by patient: Thread/unthread right pants leg, Thread/unthread left pants leg, Pull pants up/down Non-skid slipper socks- Performed by patient: Don/doff right sock, Don/doff left sock Assist for footwear: Setup Assist for lower body dressing: More than reasonable time  Function - Toileting Toileting steps completed by patient: Adjust clothing prior to toileting, Performs perineal hygiene, Adjust clothing after toileting Assist level: Supervision or verbal cues  Function - Archivist transfer assistive device: Grab bar, Walker Assist level to toilet: Touching or steadying assistance (Pt > 75%) Assist level from toilet: Touching or steadying assistance (Pt > 75%)  Function - Chair/bed transfer Chair/bed transfer method: Ambulatory Chair/bed transfer assist level: Supervision or verbal cues Chair/bed transfer assistive device: Walker Chair/bed transfer details: Verbal cues for safe use of DME/AE  Function - Locomotion: Wheelchair Will patient use wheelchair at discharge?:  (tbd) Type: Manual Max wheelchair distance: 45' Assist Level: Dependent (Pt equals 0%) Wheel 50 feet with 2 turns activity did not occur: Safety/medical concerns Wheel 150 feet activity did not occur: Safety/medical concerns Turns around,maneuvers to table,bed, and toilet,negotiates 3% grade,maneuvers on rugs and over doorsills: No Function - Locomotion: Ambulation Assistive device: Walker-rolling, No device Max distance: 135 Assist level: Supervision or verbal cues Assist level: Supervision or verbal cues Walk 50 feet with 2 turns activity did not occur: Safety/medical concerns Assist level:  Supervision or verbal cues Walk 150 feet activity did not occur: Safety/medical concerns (desaturation, on 6 L O2 via [place]) Walk 10 feet on uneven surfaces activity did not  occur: Safety/medical concerns Assist level: Supervision or verbal cues  Function - Comprehension Comprehension: Auditory Comprehension assist level: Follows complex conversation/direction with extra time/assistive device  Function - Expression Expression: Verbal Expression assist level: Expresses complex 90% of the time/cues < 10% of the time  Function - Social Interaction Social Interaction assist level: Interacts appropriately with others with medication or extra time (anti-anxiety, antidepressant).  Function - Problem Solving Problem solving assist level: Solves complex problems: With extra time  Function - Memory Memory assist level: Recognizes or recalls 50 - 74% of the time/requires cueing 25 - 49% of the time Patient normally able to recall (first 3 days only): Current season, Location of own room, Staff names and faces, That he or she is in a hospital  Medical Problem List and Plan: 1. Functional deficits secondary to debilitation/sepsis related to acute on chronic respiratory failure/multi-medical 2.  DVT Prophylaxis/Anticoagulation: SCDs. Negative  vascular study 3. Pain Management: Lyrica 200 mg 3 times a day, fentanyl patch 25 g, hydrocodone as needed. Main issue neuropathic pain in RUE, improving on hi dose Lyrica, slightly increased pain on lower fentanyl patch, may need to take hydrocodone more freq, plan to [facility]. Fentanyl prior to discharge from hospital 4. Tracheostomy tube D/Ced on 10/17 5. Neuropsych: This patient is capable of making decisions on his own behalf. 6. Skin/Wound Care: Routine skin checks 7. Fluids/Electrolytes/Nutrition: Routine I&O with follow-up chemistries, 100% meal intake, good fluid intake             Hypokalemia: Will supplement and monitor periodically 8. A. fib with RVR.  Cardizem 90 mg 3 times a day. Cardiac rate control, 76 bpm this am 9. Acute diastolic congestive heart failure. Lasix 40 mg daily. Monitor for any signs of fluid overload 10. Tachycardia: HR ok today, exam with reg rythmn, was bradycardic last noc-monitor 11. 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  Urinary retention, resolved 13.  Hypokalemia-mild recheck K post supplement LOS (Days) 7 A FACE TO FACE EVALUATION WAS PERFORMED  KIRSTEINS,ANDREW E 03/18/2015, 9:24 AM

## 2015-03-19 ENCOUNTER — Inpatient Hospital Stay (HOSPITAL_COMMUNITY): Payer: Medicaid Other | Admitting: Physical Therapy

## 2015-03-19 ENCOUNTER — Inpatient Hospital Stay (HOSPITAL_COMMUNITY): Payer: Medicaid Other

## 2015-03-19 DIAGNOSIS — Z9889 Other specified postprocedural states: Secondary | ICD-10-CM

## 2015-03-19 DIAGNOSIS — S143XXS Injury of brachial plexus, sequela: Secondary | ICD-10-CM

## 2015-03-19 DIAGNOSIS — G629 Polyneuropathy, unspecified: Secondary | ICD-10-CM

## 2015-03-19 DIAGNOSIS — R5381 Other malaise: Secondary | ICD-10-CM

## 2015-03-19 NOTE — Progress Notes (Signed)
Occupational Therapy Session Note  Patient Details  Name: Adam Benson MRN: [id number] Date of Birth: [birth year]  Today's Date: 03/19/2015 OT Individual Time: 1000-1100 OT Individual Time Calculation (min): 60 min    Short Term Goals: Week 1:  OT Short Term Goal 1 (Week 1): STG=LTG d/t anticipated short LOS  Skilled Therapeutic Interventions/Progress Updates: ADL-retraining with focus on energy conservation strategies, anticipatory and safety awareness, dynamic standing balance, and functional mobility using rollator walker.  Pt received seated in recliner with 3L/min supplemental 02.   Pt receptive to complete mobility using rollator,  transfers, and functional task of bathing/dressing while monitoring his 02 saturations.   Pt ambulates to edge of bed to retrieve clothing at dresser while seated, rests, ambulates to shower chair and rests, transfers to shower chair, rests, takes shower (sitting and standing briefly to wash buttocks), rests, recovers to rollator to dress (shirts and pants only), rests, and ambulates to recliner to don shoes after resting.   Pt requires no cues to test 02 but presents his finger each time to allow therapist to check.   02 adjusted to 4L and 6L using portable tank which must be managed by therapist d/t space limitations and size of tank while pt ambulates.   Pt reports competence with managing his 02 during task at home using concentrator and additional tubing.   02 sats drop to no lower than 74% during this session with quick recovery within 2 minutes back to 85% at 4L rate and 94% at 6L.   Pt recovers to 85% at end of session after ambulating back to his recliner with 02 adjusted to 3L.   No symptoms manifested during entire session.   Pt remains alert and confident using rollator and pulse oximeter.  Pt requires no assist with transfers, setup or performance of BADL during this session.     Therapy Documentation Precautions:  Precautions Precautions:  Fall Precaution Comments: trach, watch sats and HR  Restrictions Weight Bearing Restrictions: No  Pain: Pain Assessment Pain Assessment: 0-10 Pain Score: 7  Pain Type: Chronic pain Pain Location: Hand Pain Orientation: Right Pain Descriptors / Indicators: Aching Pain Onset: On-going Pain Intervention(s): Medication (See eMAR)  ADL: ADL ADL Comments: see Functional  Assessment   See Function Navigator for Current Functional Status.   Therapy/Group: Individual Therapy  Sharel Behne 03/19/2015, 12:41 PM

## 2015-03-19 NOTE — Progress Notes (Signed)
Physical Therapy Session Note  Patient Details  Name: Adam Benson MRN: [id number] Date of Birth: [birth year]   Today's Date: 03/19/2015 PT Individual Time: 0830-0930 PT Individual Time Calculation (min): 60 min    Skilled Therapeutic Interventions/Progress Updates:     Pt received in bed - agreeable to PT. Therapeutic Activity - see function tab for bed mobility, basic transfer, furniture transfer, and car transfer details. TranSit car set at pt reported van height - completed mod I. Furniture transfer from low, soft loveseat mod I with wide BOS - pt reports this is similar to his couch at home. Basic transfer mod I - pt desaturates to mid 80's with each of these transfers. Gait Training - see function tab for details - Pt ambulates short-medium distances within rehab unit for interval cardiorespiratory endurance training. Pt continues to desaturates to high 70's, but req 1-1.5 minute seated rest break to recover to 90's. HR increases up to 160's with gait, but recovers to 90's-100's (pt has a-fib) with seated rest break stated above. Therapeutic Exercise - PT places pt on Nu-step at L4 x 10 minutes with vitals checked intermittently: Pt desaturates to 90's on 6 L O2/min and HR increases to 110. Pt ended up in recliner in room with all needs in reach. Continue per PT POC.    Therapy Documentation Precautions:  Precautions Precautions: Fall Precaution Comments: trach, watch sats and HR  Restrictions Weight Bearing Restrictions: No Vital Signs:  see narrative Pain: Pain Assessment Pain Assessment: 0-10 Pain Score: 9  Pain Type: Chronic pain Pain Location: Hand Pain Orientation: Right Pain Descriptors / Indicators: Aching Pain Onset: On-going Pain Intervention(s): Distraction;Emotional support Multiple Pain Sites: No   See Function Navigator for Current Functional Status.  Therapy/Group: Individual Therapy  Neysha Criado M 03/19/2015, 8:50 AM

## 2015-03-19 NOTE — Discharge Summary (Signed)
Discharge summary job # [id number]

## 2015-03-19 NOTE — Progress Notes (Signed)
Subjective/Complaints: Pt seen and examined laying in bed.  Pt very curt, but states he does not have any complaints.    Review of Systems - Denies CP, SOB, n/v/d.  Objective: Vital Signs: Blood pressure 122/67, pulse 64, temperature 97.7 F (36.5 C), temperature source Oral, resp. rate 18, height 6' (1.829 m), weight 125.4 kg (276 lb 7.3 oz), SpO2 100 %. No results found. No results found for this or any previous visit (from the past 72 hour(s)).   HEENT: trach stoma with occlusive dressing Cardio: RRR and no murmur Resp: CTA B/L and unlabored GI: BS positive and NT, ND Skin:   Warm and dry.  Neuro: Alert/Oriented, Normal Sensation Musc/Skelhallux valgus and hammertoes Other hallux valgus and hammertoes intrinsic muscle atrophy RUE> LUE, and Bilateral LE No Edema Antigravity strength throughout GEN: NAD. Vitals reviewed.    Assessment/Plan: 1. Functional deficits secondary to debilitation related to acute on chronic respiratory failure/sepsis multi-medical which require 3+ hours per day of interdisciplinary therapy in a comprehensive inpatient rehab setting. Physiatrist is providing close team supervision and 24 hour management of active medical problems listed below. Physiatrist and rehab team continue to assess barriers to discharge/monitor patient progress toward functional and medical goals. FIM: Function - Bathing Position: Shower Body parts bathed by patient: Right arm, Left arm, Chest, Abdomen, Front perineal area, Buttocks, Right upper leg, Left upper leg, Right lower leg, Left lower leg Bathing not applicable: Back Assist Level: More than reasonable time Set up : To obtain items  Function- Upper Body Dressing/Undressing What is the patient wearing?: Pull over shirt/dress Pull over shirt/dress - Perfomed by patient: Thread/unthread right sleeve, Thread/unthread left sleeve, Pull shirt over trunk, Put head through opening Pull over shirt/dress - Perfomed by helper: Put  head through opening Assist Level: More than reasonable time Function - Lower Body Dressing/Undressing What is the patient wearing?: Pants, Non-skid slipper socks Position: Wheelchair/chair at sink Pants- Performed by patient: Thread/unthread right pants leg, Thread/unthread left pants leg, Pull pants up/down Non-skid slipper socks- Performed by patient: Don/doff right sock, Don/doff left sock Assist for footwear: Setup Assist for lower body dressing: More than reasonable time  Function - Toileting Toileting steps completed by patient: Adjust clothing prior to toileting, Performs perineal hygiene, Adjust clothing after toileting Assist level: Supervision or verbal cues  Function - Archivist transfer assistive device: Grab bar, Walker Assist level to toilet: Touching or steadying assistance (Pt > 75%) Assist level from toilet: Touching or steadying assistance (Pt > 75%)  Function - Chair/bed transfer Chair/bed transfer method: Ambulatory Chair/bed transfer assist level: Set up only Chair/bed transfer assistive device: Walker Chair/bed transfer details: Verbal cues for safe use of DME/AE  Function - Locomotion: Wheelchair Will patient use wheelchair at discharge?: No Type: Manual Max wheelchair distance: 89' Assist Level: Dependent (Pt equals 0%) Wheel 50 feet with 2 turns activity did not occur: Safety/medical concerns Wheel 150 feet activity did not occur: Safety/medical concerns Turns around,maneuvers to table,bed, and toilet,negotiates 3% grade,maneuvers on rugs and over doorsills: No Function - Locomotion: Ambulation Assistive device: Walker-rolling Max distance: 100' + 28' + 70' + 45' + 90' Assist level: Supervision or verbal cues Assist level: Supervision or verbal cues Walk 50 feet with 2 turns activity did not occur: Safety/medical concerns Assist level: Supervision or verbal cues Walk 150 feet activity did not occur: Safety/medical concerns (desaturation,  on 6 L O2 via [place]) Walk 10 feet on uneven surfaces activity did not occur: Safety/medical concerns Assist level: Supervision or  verbal cues  Function - Comprehension Comprehension: Auditory Comprehension assist level: Follows complex conversation/direction with extra time/assistive device  Function - Expression Expression: Verbal Expression assist level: Expresses complex 90% of the time/cues < 10% of the time  Function - Social Interaction Social Interaction assist level: Interacts appropriately with others with medication or extra time (anti-anxiety, antidepressant).  Function - Problem Solving Problem solving assist level: Solves complex problems: With extra time  Function - Memory Memory assist level: Recognizes or recalls 90% of the time/requires cueing < 10% of the time Patient normally able to recall (first 3 days only): Current season, Location of own room, Staff names and faces, That he or she is in a hospital  Medical Problem List and Plan: 1. Functional deficits secondary to debilitation/sepsis related to acute on chronic respiratory failure/multi-medical 2.  DVT Prophylaxis/Anticoagulation: SCDs. Negative  vascular study 3. Pain Management: Lyrica 200 mg 3 times a day, fentanyl patch 25 g, hydrocodone as needed. Main issue neuropathic pain in RUE, improving on hi dose Lyrica, slightly increased pain on lower fentanyl patch, may need to take hydrocodone more freq, plan to [facility]. Fentanyl prior to discharge from hospital 4. Tracheostomy tube D/Ced on 10/17 5. Neuropsych: This patient is capable of making decisions on his own behalf. 6. Skin/Wound Care: Routine skin checks 7. Fluids/Electrolytes/Nutrition: Routine I&O with follow-up chemistries, 100% meal intake, good fluid intake             Hypokalemia: Will supplement and monitor periodically.  Labs ordered for tomorrow.  8. A. fib with RVR. Cardizem 90 mg 3 times a day. Cardiac rate control, 76 bpm this am 9. Acute  diastolic congestive heart failure. Lasix 40 mg daily. Monitor for any signs of fluid overload 10. Tachycardia: Controlled 11. 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  Urinary retention, resolved  LOS (Days) 8 A FACE TO FACE EVALUATION WAS PERFORMED  Adam Benson Adam Benson 03/19/2015, 7:32 AM

## 2015-03-19 NOTE — Discharge Summary (Signed)
NAME:  Adam Benson, Adam Benson NO.:  [id number]  MEDICAL RECORD NO.:  [id number]  LOCATION:  4M04C                        FACILITY:  [facility]  PHYSICIAN:  Erick Colace, M.D.DATE OF BIRTH:  1960/08/05  DATE OF ADMISSION:  03/11/2015 DATE OF DISCHARGE:                              DISCHARGE SUMMARY   DISCHARGE DIAGNOSES: 1. Functional deficits secondary to debilitation, sepsis related to     acute on chronic respiratory failure. 2. Pain management. 3. SCDs for DVT prophylaxis. 4. Tracheostomy, decannulated. 5. Atrial fibrillation with rapid ventricular response. 6. Acute diastolic congestive heart failure. 7. Polyneuropathy. 8. Urinary retention, resolved.  HISTORY OF PRESENT ILLNESS:  This is a 54 year old right-handed male history of chronic atrial fibrillation, morbid obesity, and nerve damage, right upper extremity.  Lives alone, independent prior to admission, One-level home, supportive family.  Presented from outside hospital, February 01, 2015, with increased shortness of breath, weight gain, and lower extremity edema.  Denied fever or chills.  Noted increasing cough as well as wheezing.  Noted the patient did have a history of respiratory failure, January of 2016 as well as March of 2016 with tracheostomy tube, later decannulated receiving care at [place], [place].  He does maintain chronic oxygen at home.  Noted hospital course with worsening respiratory status.  ICU course complicated by atrial fibrillation with RVR.  Echocardiogram with ejection fraction, 55%.  No wall motion abnormalities.  Follow Pulmonary Medicine, ventilatory support, tracheostomy performed on February 17, 2015.  Trach did dislodge, changed to extra long successfully on February 18, 2015.  As of March 10, 2015, adjusting oxygen.  Remained on Cardizem at that time for cardiac rate control.  Diet advanced to regular.  The patient was admitted for comprehensive rehab  program.  PAST MEDICAL HISTORY:  See discharge diagnoses.  SOCIAL HISTORY:  Lives alone.  Supportive family, independent prior to admission.  Functional status upon admission to rehab services was minimal assist to ambulate 10 feet with rolling walker, minimal assist stand pivot transfers, min to mod assist activities of daily living.  PHYSICAL EXAMINATION:  VITAL SIGNS:  Blood pressure 115/74, pulse 89, temperature 97, and respirations 18. GENERAL:  This was an alert male, in no acute distress, oriented x3. HEENT:  Pupils were round, reactive to light. LUNGS:  Decreased breath sounds.  Clear to auscultation without wheezes. Trach tube in place. ABDOMEN:  Soft, nontender.  Good bowel sounds. CARDIAC:  Rate controlled. EXTREMITIES:  Moving all extremities.  REHABILITATION AND HOSPITAL COURSE:  The patient was admitted to inpatient rehab services with therapies initiated on a 3-hour daily basis consisting of physical therapy, occupational therapy, and rehabilitation nursing.  The following issues were addressed during the patient's rehabilitation stay.  Pertaining to Mr. Janowiak deconditioning related to sepsis, acute on chronic respiratory failure, he continued to participate doing nicely with therapies.  He will continue prednisone therapy, follow up Outpatient Pulmonary Services.  He was decannulated on March 16, 2015, close monitoring of oxygen saturations, currently on 5 L of oxygen, went up with activities to keep saturations greater than 88%.  Cardiac rate remained controlled.  He would stay on Cardizem. He exhibited noted signs of fluid  overload.  He remained on Lasix 40 mg daily.  Initial bouts of urinary retention since resolved.  The patient received weekly collaborative interdisciplinary team conferences to discuss estimated length of stay, family teaching, and any barriers to discharge.  Ambulating extended distances with assistive device, working with endurance and  energy conservation techniques.  Educated on all issues of balance, safety, and energy conservation.  He could gather his belongings for activities of daily living and homemaking, navigate in his room to gather his belongings.  The patient encouraged overall with progress and plan discharge to home.  DISCHARGE MEDICATIONS:  Included: 1. Cardizem 90 mg p.o. t.i.d. 2. Pepcid 20 mg p.o. b.i.d. 3. Lasix 40 mg daily. 4. Oxycodone immediate-release 10 mg every 3 hours as needed for pain,     dispense of 90 tablets. 5. Potassium chloride 20 mEq p.o. b.i.d. 6. Prednisone 20 mg p.o. daily. 7. Lyrica 200 mg p.o. t.i.d. His diet was regular.  It was planned that his fentanyl patch would be discontinued on day of discharge, and the patient was accepting of this. He will continue his hydrocodone.  PLAN:  Follow up with Dr. [name], call for appointment; Dr. Rubye Oaks, March 13, 2015; Dr. Claudette Laws as needed.     Mariam Dollar, P.A.   ______________________________ Erick Colace, M.D.    DA/MEDQ  D:  03/19/2015  T:  03/19/2015  Job:  [id number]  cc:   Kalman Shan, MD Rubye Oaks, NP Erick Colace, M.D.

## 2015-03-19 NOTE — Progress Notes (Signed)
Social Work Patient ID: Adam Benson, male   DOB: 03-22-1961, 54 y.o.   MRN: [id number]   CSW met with pt to update him on team conference discussion and decision of pulmonology that pt can go home 03-20-15.  Pt was very pleased.  His father will come to get him and will bring him an oxygen tank.  CSW ordered a wide rollator for pt from [place], but pt declined a home health nurse.  CSW explained he is not eligible for home therapies and he is fine with that, as he does not want anyone in his home.  CSW will remain available as needed to assist pt with d/c plan.

## 2015-03-19 NOTE — Plan of Care (Signed)
Problem: RH Wheelchair Mobility Goal: LTG Patient will propel w/c in controlled environment (PT) LTG: Patient will propel wheelchair in controlled environment, # of feet with assist (PT)  Outcome: Not Applicable Date Met:  15/94/70 Goal d/c due to pt ambulatory and high energy cost of w/c Goal: LTG Patient will propel w/c in home environment (PT) LTG: Patient will propel wheelchair in home environment, # of feet with assistance (PT).  Outcome: Not Applicable Date Met:  76/15/18 Goal d/c due to pt ambulatory and high energy cost of w/c
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
## 2015-03-20 LAB — BASIC METABOLIC PANEL
ANION GAP: 10 (ref 5–15)
BUN: 8 mg/dL (ref 6–20)
CHLORIDE: 103 mmol/L (ref 101–111)
CO2: 31 mmol/L (ref 22–32)
Calcium: 8.8 mg/dL — ABNORMAL LOW (ref 8.9–10.3)
Creatinine, Ser: 0.84 mg/dL (ref 0.61–1.24)
GFR calc Af Amer: >60 mL/min (ref >60)
GFR calc non Af Amer: >60 mL/min (ref >60)
GLUCOSE: 73 mg/dL (ref 65–99)
POTASSIUM: 3.7 mmol/L (ref 3.5–5.1)
Sodium: 144 mmol/L (ref 135–145)

## 2015-03-20 LAB — CBC WITH DIFFERENTIAL/PLATELET
BASOS PCT: 0 %
Basophils Absolute: 0 10*3/uL (ref 0.0–0.1)
EOS PCT: 3 %
Eosinophils Absolute: 0.2 10*3/uL (ref 0.0–0.7)
HEMATOCRIT: 35.1 % — AB (ref 39.0–52.0)
HEMOGLOBIN: 10.4 g/dL — AB (ref 13.0–17.0)
LYMPHS PCT: 35 %
Lymphs Abs: 2.4 10*3/uL (ref 0.7–4.0)
MCH: 23.1 pg — ABNORMAL LOW (ref 26.0–34.0)
MCHC: 29.6 g/dL — ABNORMAL LOW (ref 30.0–36.0)
MCV: 77.8 fL — AB (ref 78.0–100.0)
MONO ABS: 0.8 10*3/uL (ref 0.1–1.0)
Monocytes Relative: 12 %
NEUTROS PCT: 50 %
Neutro Abs: 3.4 10*3/uL (ref 1.7–7.7)
Platelets: 317 10*3/uL (ref 150–400)
RBC: 4.51 MIL/uL (ref 4.22–5.81)
RDW: 23.8 % — ABNORMAL HIGH (ref 11.5–15.5)
WBC: 6.8 10*3/uL (ref 4.0–10.5)

## 2015-03-20 MED ORDER — FENOFIBRATE 145 MG PO TABS: 145.0000 mg | ORAL_TABLET | Freq: Every day | ORAL | Status: AC

## 2015-03-20 MED ORDER — FAMOTIDINE 20 MG PO TABS: 20.0000 mg | ORAL_TABLET | Freq: Two times a day (BID) | ORAL | Status: AC

## 2015-03-20 MED ORDER — FUROSEMIDE 40 MG PO TABS: 40.0000 mg | ORAL_TABLET | Freq: Every day | ORAL | Status: DC

## 2015-03-20 MED ORDER — PREGABALIN 200 MG PO CAPS: 200.0000 mg | ORAL_CAPSULE | Freq: Three times a day (TID) | ORAL | Status: AC

## 2015-03-20 MED ORDER — POTASSIUM CHLORIDE CRYS ER 20 MEQ PO TBCR: 20.0000 meq | EXTENDED_RELEASE_TABLET | Freq: Two times a day (BID) | ORAL | Status: DC

## 2015-03-20 MED ORDER — OXYCODONE HCL 10 MG PO TABS: 10.0000 mg | ORAL_TABLET | ORAL | Status: DC | PRN

## 2015-03-20 MED ORDER — DILTIAZEM HCL 90 MG PO TABS: 90.0000 mg | ORAL_TABLET | Freq: Three times a day (TID) | ORAL | Status: DC

## 2015-03-20 MED ORDER — PREDNISONE 20 MG PO TABS: 20.0000 mg | ORAL_TABLET | Freq: Every day | ORAL | Status: DC

## 2015-03-20 MED ORDER — OMEPRAZOLE 20 MG PO CPDR: 20.0000 mg | DELAYED_RELEASE_CAPSULE | Freq: Two times a day (BID) | ORAL | Status: DC

## 2015-03-20 NOTE — Progress Notes (Signed)
Social Work Discharge Note  The overall goal for the admission was met for:   Discharge location: Yes - home   Length of Stay: Yes - 10 days  Discharge activity level: Yes - modified independent  Home/community participation: Yes  Services provided included: MD, RD, PT, OT, RN, Pharmacy and SW  Financial Services: Medicaid  Follow-up services arranged: DME: wide rollator from [place] and Patient/Family has no preference for HH/DME agencies  Comments (or additional information):  Pt to return to his home at modified independent level with wide rollator.  Father will help him run errands.  Pt was offered home nursing, but declined services.   Patient/Family verbalized understanding of follow-up arrangements: Yes  Individual responsible for coordination of the follow-up plan: pt  Confirmed correct DME delivered: Trey Sailors 03/20/2015    Orma Cheetham, Silvestre Mesi
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
## 2015-03-20 NOTE — Patient Care Conference (Signed)
Inpatient RehabilitationTeam Conference and Plan of Care Update Date: 03/18/2015   Time: 2:30 PM    Patient Name: Adam Benson      Medical Record Number: [id number]  Date of Birth: [birth year] Sex: Male         Room/Bed: 4M04C/4M04C-01 Payor Info: Payor: MEDICAID [place] / Plan: MEDICAID [place] ACCESS / Product Type: *No Product type* /    Admitting Diagnosis: DEBILITY ARDS  Admit Date/Time:  03/11/2015  3:33 PM Admission Comments: No comment available   Primary Diagnosis:  Debilitated Principal Problem: Debilitated  Patient Active Problem List   Diagnosis Date Noted  . Swelling   . Motor polyneuropathy (HCC) 03/15/2015  . Debilitated 03/11/2015  . CAP (community acquired pneumonia)   . ILD (interstitial lung disease) (HCC)   . Acute on chronic respiratory failure with hypoxia (HCC)   . Cough   . Tracheostomy, acute management (HCC)   . Acute respiratory failure (HCC)   . C. difficile colitis 02/10/2015  . Acute diastolic heart failure (HCC) 02/07/2015  . CKD (chronic kidney disease) stage 3, GFR 30-59 ml/min 02/07/2015  . ARDS (adult respiratory distress syndrome) (HCC)   . Encounter for central line placement   . History of ETT   . Acute respiratory failure with hypoxemia (HCC) 02/01/2015  . Septic shock (HCC)   . 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 Chronic respiratory failure with hypoxia (HCC) 10/22/2014  . Restrictive airway disease 10/22/2014  . HTN (hypertension), benign   . Chronic atrial fibrillation (HCC)   . Hyperlipemia   . GERD (gastroesophageal reflux disease)   . History of tracheostomy (HCC)   . History of pneumonia 05/30/2014    Expected Discharge Date: Expected Discharge Date: 03/20/15  Team Members Present: Physician leading conference: Dr. Maryla Morrow Nurse Present: Chana Bode, RN PT Present: Laurin Coder, PT OT Present: Roney Mans, OT PPS Coordinator present : Tora Duck, RN, CRRN     Current Status/Progress Goal Weekly Team Focus  Medical   Debility s/p respiratory failure  Improve pain, respiratory status, conditioning  Improve electrolytes, pain, conditioning   Bowel/Bladder   Continent of bowel and bladder. LBM 03/15/15  Pt to remain continent of bowel and bladder.   Monitor    Swallow/Nutrition/ Hydration             ADL's   Supervision for BADL and transfers  Mod I  Energy conservation, diaphragmatic/pursed-lip breathing, adapted bathing/dressing, functional mobility, dynamic balance.   Mobility   grossly supervision transfers and ambulation  mod I  energy conservation, safety with mobility, continued cardiorespiratory endurance training   Communication             Safety/Cognition/ Behavioral Observations            Pain   Requesting Oxy IR  prn q 3hrs. Scheduled Lyrica  daily, Fentyal 12.56mcg q 72hrs  <7  Assess for effectiveness of medication   Skin   Old trach site healing appropriately. Shaggy, shearing, peeling skin to R buttocks, healing, OTA  No additional skin breakdown  Encourage turn q 2hrs    Rehab Goals Patient on target to meet rehab goals: Yes Rehab Goals Revised: none *See Care Plan and progress notes for long and short-term goals.  Barriers to Discharge: Desaturations, abnormal labs values, pain    Possible Resolutions to Barriers:  Improve respiratory status, lab values, and pain    Discharge Planning/Teaching Needs:  Pt plans to return to his home at d/c.  He can manage his oxygen at  home and has his father for assistance with errands.  Pt is at modified independent level and is going home alone.  All teaching was done with pt.   Team Discussion:  Pt is doing well in therapies, yet he continues to get winded with O2 sats in the 70s.  It takes him a few minutes to recover.  Pt's trach has been removed.  He will need to apply barrier cream to his buttocks and CSW explained to team that he will need to be trained on this as he will be home alone and this needs to be applied at least  twice a day. Pt needs a wide rollator at d/c.  He will not qualify for home health therapies due to his insurance.  MD is concerned about pt desats and is not willing to say he is medically stable for d/c until discussed with pulmonary doctor.  PA to do this.  Revisions to Treatment Plan:  none   Continued Need for Acute Rehabilitation Level of Care: The patient requires daily medical management by a physician with specialized training in physical medicine and rehabilitation for the following conditions: Daily direction of a multidisciplinary physical rehabilitation program to ensure safe treatment while eliciting the highest outcome that is of practical value to the patient.: Yes Daily medical management of patient stability for increased activity during participation in an intensive rehabilitation regime.: Yes Daily analysis of laboratory values and/or radiology reports with any subsequent need for medication adjustment of medical intervention for : Pulmonary problems  Adam Benson, Adam Benson 03/20/2015, 10:30 AM

## 2015-03-20 NOTE — Progress Notes (Signed)
Pt discharged home with father. Discharge instructions provided by Harvel Ricks, PA. All questions answered, pt verbalized understanding. Dressing to trach site changed prior to discharge. Scant amount of serous drainage noted on dressing, site closing appropriately.  Pt escorted off unit in w/c with personal belonging by Jacqulyn Cane, NT.

## 2015-03-20 NOTE — Progress Notes (Signed)
Subjective/Complaints: Pt seen and examined this AM sitting up in his chair, dressed, and "ready to go".  He is in much better spirits this AM.      Review of Systems - Denies CP, SOB, n/v/d.  Objective: Vital Signs: Blood pressure 123/62, pulse 98, temperature 97.7 F (36.5 C), temperature source Oral, resp. rate 18, height 6' (1.829 m), weight 125.4 kg (276 lb 7.3 oz), SpO2 95 %. No results found. Results for orders placed or performed during the hospital encounter of 03/11/15 (from the past 72 hour(s))  CBC with Differential/Platelet     Status: Abnormal (Preliminary result)   Collection Time: 03/20/15  3:56 AM  Result Value Ref Range   WBC 6.8 4.0 - 10.5 K/uL   RBC 4.51 4.22 - 5.81 MIL/uL   Hemoglobin 10.4 (L) 13.0 - 17.0 g/dL   HCT 35.1 (L) 39.0 - 52.0 %   MCV 77.8 (L) 78.0 - 100.0 fL   MCH 23.1 (L) 26.0 - 34.0 pg   MCHC 29.6 (L) 30.0 - 36.0 g/dL   RDW 23.8 (H) 11.5 - 15.5 %   Platelets 317 150 - 400 K/uL   Neutrophils Relative % PENDING %   Neutro Abs PENDING 1.7 - 7.7 K/uL   Band Neutrophils PENDING %   Lymphocytes Relative PENDING %   Lymphs Abs PENDING 0.7 - 4.0 K/uL   Monocytes Relative PENDING %   Monocytes Absolute PENDING 0.1 - 1.0 K/uL   Eosinophils Relative PENDING %   Eosinophils Absolute PENDING 0.0 - 0.7 K/uL   Basophils Relative PENDING %   Basophils Absolute PENDING 0.0 - 0.1 K/uL   WBC Morphology PENDING    RBC Morphology PENDING    Smear Review PENDING    nRBC PENDING 0 /100 WBC   Metamyelocytes Relative PENDING %   Myelocytes PENDING %   Promyelocytes Absolute PENDING %   Blasts PENDING %  Basic metabolic panel     Status: Abnormal   Collection Time: 03/20/15  3:56 AM  Result Value Ref Range   Sodium 144 135 - 145 mmol/L   Potassium 3.7 3.5 - 5.1 mmol/L   Chloride 103 101 - 111 mmol/L   CO2 31 22 - 32 mmol/L   Glucose, Bld 73 65 - 99 mg/dL   BUN 8 6 - 20 mg/dL   Creatinine, Ser 0.84 0.61 - 1.24 mg/dL   Calcium 8.8 (L) 8.9 - 10.3 mg/dL   GFR  calc non Af Amer >60 >60 mL/min   GFR calc Af Amer >60 >60 mL/min    Comment: (NOTE) The eGFR has been calculated using the CKD EPI equation. This calculation has not been validated in all clinical situations. eGFR's persistently <60 mL/min signify possible Chronic Kidney Disease.    Anion gap 10 5 - 15     HEENT: trach stoma with occlusive dressing Cardio: RRR and no murmur Resp: CTA B/L and unlabored GI: BS positive and NT, ND Skin:   Warm and dry.  Neuro: Alert/Oriented, Normal Sensation Musc/Skelhallux valgus and hammertoes Other hallux valgus and hammertoes intrinsic muscle atrophy RUE> LUE, and Bilateral LE No Edema Antigravity strength throughout GEN: NAD. Vitals reviewed.    Assessment/Plan: 1. Functional deficits secondary to debilitation related to acute on chronic respiratory failure/sepsis multi-medical which require 3+ hours per day of interdisciplinary therapy in a comprehensive inpatient rehab setting. Physiatrist is providing close team supervision and 24 hour management of active medical problems listed below. Physiatrist and rehab team continue to assess barriers to discharge/monitor  patient progress toward functional and medical goals. FIM: Function - Bathing Position: Shower Body parts bathed by patient: Right arm, Left arm, Chest, Front perineal area, Abdomen, Buttocks, Right upper leg, Left upper leg, Right lower leg, Left lower leg Bathing not applicable: Back Assist Level: Supervision or verbal cues Set up : To obtain items  Function- Upper Body Dressing/Undressing What is the patient wearing?: Pull over shirt/dress Pull over shirt/dress - Perfomed by patient: Thread/unthread right sleeve, Thread/unthread left sleeve, Pull shirt over trunk, Put head through opening Pull over shirt/dress - Perfomed by helper: Put head through opening Assist Level: No help, No cues, More than reasonable time Function - Lower Body Dressing/Undressing What is the patient  wearing?: Pants, Socks, Shoes Position: Wheelchair/chair at sink Pants- Performed by patient: Thread/unthread left pants leg, Thread/unthread right pants leg, Pull pants up/down, Fasten/unfasten pants Non-skid slipper socks- Performed by patient: Don/doff right sock, Don/doff left sock Socks - Performed by patient: Don/doff right sock, Don/doff left sock Shoes - Performed by patient: Don/doff right shoe, Don/doff left shoe, Fasten right, Fasten left Assist for footwear: Setup Assist for lower body dressing: More than reasonable time  Function - Toileting Toileting steps completed by patient: Adjust clothing prior to toileting, Performs perineal hygiene, Adjust clothing after toileting Assist level: More than reasonable time  Function - Air cabin crew transfer assistive device: Walker Assist level to toilet: No Help, no cues, assistive device, takes more than a reasonable amount of time Assist level from toilet: No Help, no cues, assistive device, takes more than a reasonable amount of time  Function - Chair/bed transfer Chair/bed transfer method: Ambulatory Chair/bed transfer assist level: No Help, no cues, assistive device, takes more than a reasonable amount of time (placing oxygen on front bar of 4WW) Chair/bed transfer assistive device: Walker Chair/bed transfer details: Verbal cues for safe use of DME/AE  Function - Locomotion: Wheelchair Will patient use wheelchair at discharge?: No Type: Manual Max wheelchair distance: 87' Assist Level: Dependent (Pt equals 0%) Wheel 50 feet with 2 turns activity did not occur: Safety/medical concerns Wheel 150 feet activity did not occur: Safety/medical concerns Turns around,maneuvers to table,bed, and toilet,negotiates 3% grade,maneuvers on rugs and over doorsills: No Function - Locomotion: Ambulation Assistive device: Walker-rolling Max distance: 150' + 50' + 75' + 100' + 75' + 75' Assist level: No help, No cues, assistive  device, takes more than a reasonable amount of time Assist level: No help, No cues, assistive device, takes more than a reasonable amount of time Walk 50 feet with 2 turns activity did not occur: Safety/medical concerns Assist level: No help, No cues, assistive device, takes more than a reasonable amount of time Walk 150 feet activity did not occur: Safety/medical concerns (desaturation, on 6 L O2 via [place]) Assist level: No help, No cues, assistive device, takes more than a reasonable amount of time Walk 10 feet on uneven surfaces activity did not occur: Safety/medical concerns Assist level: Supervision or verbal cues  Function - Comprehension Comprehension: Auditory Comprehension assist level: Follows complex conversation/direction with extra time/assistive device  Function - Expression Expression: Verbal Expression assist level: Expresses complex ideas: With no assist  Function - Social Interaction Social Interaction assist level: Interacts appropriately with others - No medications needed.  Function - Problem Solving Problem solving assist level: Solves complex problems: Recognizes & self-corrects  Function - Memory Memory assist level: Complete Independence: No helper Patient normally able to recall (first 3 days only): Current season, Location of own room, Staff names  and faces, That he or she is in a hospital  Medical Problem List and Plan: 1. Functional deficits secondary to debilitation/sepsis related to acute on chronic respiratory failure/multi-medical  -Ready for discharge today 2.  DVT Prophylaxis/Anticoagulation: SCDs. Negative  vascular study 3. Pain Management: Lyrica 200 mg 3 times a day, fentanyl patch 25 g, hydrocodone as needed. Main issue neuropathic pain in RUE, improving on high dose Lyrica, D.C. Fentanyl prior to discharge from hospital 4. Tracheostomy tube D/Ced on 10/17 5. Neuropsych: This patient is capable of making decisions on his own behalf. 6.  Skin/Wound Care: Routine skin checks 7. Fluids/Electrolytes/Nutrition: Routine I&O with follow-up chemistries, 100% meal intake, good fluid intake             Hypokalemia: Will supplement and monitor periodically.  Labs reviewed, stable.  8. A. fib with RVR. Cardizem 90 mg 3 times a day. Cardiac rate control, 76 bpm this am 9. Acute diastolic congestive heart failure. Lasix 40 mg daily. Monitor for any signs of fluid overload 10. Tachycardia: Controlled 11. 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  Urinary retention, resolved  LOS (Days) 9 A FACE TO FACE EVALUATION WAS PERFORMED  Morrill Bomkamp Lorie Phenix 03/20/2015, 8:16 AM

## 2015-03-20 NOTE — Discharge Instructions (Signed)
Inpatient Rehab Discharge Instructions  Avid Sollie Discharge date and time: No discharge date for patient encounter.   Activities/Precautions/ Functional Status: Activity: activity as tolerated Diet: regular diet Wound Care: none needed Functional status:  ___ No restrictions     ___ Walk up steps independently ___ 24/7 supervision/assistance   ___ Walk up steps with assistance ___ Intermittent supervision/assistance  ___ Bathe/dress independently ___ Walk with walker     ___ Bathe/dress with assistance ___ Walk Independently    ___ Shower independently _x__ Walk with assistance    ___ Shower with assistance ___ No alcohol     ___ Return to work/school ________  COMMUNITY REFERRALS UPON DISCHARGE:   Home Health:   RN - You are eligible for home nursing, but you declined this service at discharge.  If you find you need it, please contact your primary care physician. Medical Equipment/Items Ordered:  Wide rollator  Agency/Supplier:  [facility]    Phone:  [phone]         Oxygen from [facility]            Phone:  [phone]  Special Instructions: Continue oxygen therapy as directed  Continue occlusive dressing to trach site until healed   My questions have been answered and I understand these instructions. I will adhere to these goals and the provided educational materials after my discharge from the hospital.  Patient/Caregiver Signature _______________________________ Date __________  Clinician Signature _______________________________________ Date __________  Please bring this form and your medication list with you to all your follow-up doctor's appointments.

## 2015-03-28 LAB — AFB CULTURE WITH SMEAR (NOT AT ARMC)
ACID FAST SMEAR: NONE SEEN
SPECIAL REQUESTS: NORMAL

## 2015-04-13 ENCOUNTER — Ambulatory Visit (INDEPENDENT_AMBULATORY_CARE_PROVIDER_SITE_OTHER): Payer: Medicaid Other | Admitting: Adult Health

## 2015-04-13 ENCOUNTER — Ambulatory Visit (INDEPENDENT_AMBULATORY_CARE_PROVIDER_SITE_OTHER)
Admission: RE | Admit: 2015-04-13 | Discharge: 2015-04-13 | Disposition: A | Discharge status: Home / Self Care | Payer: Medicaid Other | Source: Ambulatory Visit | Attending: Adult Health | Admitting: Adult Health

## 2015-04-13 ENCOUNTER — Encounter: Payer: Self-pay | Admitting: Adult Health

## 2015-04-13 VITALS — BP 114/78 | HR 91 | Temp 98.5°F | Ht 72.0 in | Wt 299.0 lb

## 2015-04-13 DIAGNOSIS — J9611 Chronic respiratory failure with hypoxia: Secondary | ICD-10-CM | POA: Diagnosis not present

## 2015-04-13 DIAGNOSIS — Z9889 Other specified postprocedural states: Secondary | ICD-10-CM

## 2015-04-13 DIAGNOSIS — J849 Interstitial pulmonary disease, unspecified: Secondary | ICD-10-CM | POA: Diagnosis not present

## 2015-04-13 DIAGNOSIS — R059 Cough, unspecified: Secondary | ICD-10-CM

## 2015-04-13 DIAGNOSIS — R05 Cough: Secondary | ICD-10-CM

## 2015-04-13 DIAGNOSIS — J80 Acute respiratory distress syndrome: Secondary | ICD-10-CM

## 2015-04-13 NOTE — Assessment & Plan Note (Signed)
Doing well , decannulated 03/16/15  Adam Benson site looks good.

## 2015-04-13 NOTE — Assessment & Plan Note (Signed)
Check cxr today .  

## 2015-04-13 NOTE — Progress Notes (Signed)
Subjective:    Patient ID: Adam Benson, male    DOB: 09-29-1960, 54 y.o.   MRN: [id number]  HPI 54 yo male former patient of DR. Delford Field , ARDS survivor on home O2 was seen for [facility] consult after transfer from [facility] with Acute resp Failure with PNA /ARDS requiring intubation 02/01/15 . He had prolonged critical illness requiring tracheostomy. Suffered from Atrial Fib with RVR, shock, and c diff.   He had similar incidence in early 2016 with trach /decannulation .    04/13/2015 Post Hospital follow up  Pt returns for 1 month follow up .  Recently admitted for critical illness. He was admitted earlier this year for severe PNA w/ ARDS requiring prolonged vent support . He did have trach and was later decanulated. Tx in [place].  Presented acutely ill to [facility] in September , with PNA/ARDS requiring intubation on 02/01/15.   He had prolonged critical illness requiring tracheostomy. Suffered from Atrial Fib with RVR, shock, and c diff. He required discharged to IP rehab. Discharged home on 03/19/15.  He was decanulated on 03/16/15 . [name] site is healing well. No redness or fever.  He is Feeling much better.  Walking is doing much better, not having to use walker Remains on Oxygen 2l rest/At bedtime   and 5l/m act (pulse) .  Has been on oxygen since earlier admission in February this year.  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 On disability.  Says he is so much better. Less DOE but gets worn out easily and winded with prolonged walking.  Denies any chest tightness/congestion, sinus pressure, cough, fever, nausea or vomting.   says he had a sleep study  in [place] , dx with OSA . Suppose to see sleep doc but got admitted.  We requested copy of sleep study.     Past Medical History  Diagnosis Date  . Gout   . HTN (hypertension), benign   . Chronic atrial  fibrillation (HCC)   . Hyperlipemia   . Nerve damage     right arm  . Obesity   . Insomnia   . GERD (gastroesophageal reflux disease)   . History of pneumonia 05/2014     two month hosp stay, vent, trach   . 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 Peptic ulcer 04/2014  . Pneumonia    Current Outpatient Prescriptions on File Prior to Visit  Medication Sig Dispense Refill  . diltiazem (CARDIZEM) 90 MG tablet Take 1 tablet (90 mg total) by mouth every 8 (eight) hours. 90 tablet 1  . famotidine (PEPCID) 20 MG tablet Take 1 tablet (20 mg total) by mouth 2 (two) times daily. 60 tablet 1  . fenofibrate (TRICOR) 145 MG tablet Take 1 tablet (145 mg total) by mouth daily. 30 tablet 1  . furosemide (LASIX) 40 MG tablet Take 1 tablet (40 mg total) by mouth daily with breakfast. 30 tablet 1  . omeprazole (PRILOSEC) 20 MG capsule Take 1 capsule (20 mg total) by mouth 2 (two) times daily before a meal. 60 capsule 1  . oxyCODONE 10 MG TABS Take 1 tablet (10 mg total) by mouth every 3 (three) hours as needed (moderate pain). 90 tablet 0  . potassium chloride SA (K-DUR,KLOR-CON) 20 MEQ tablet Take 1 tablet (20 mEq total) by mouth 2 (two) times daily.  60 tablet 1  . predniSONE (DELTASONE) 20 MG tablet Take 1 tablet (20 mg total) by mouth daily with breakfast. 30 tablet 1  . pregabalin (LYRICA) 200 MG capsule Take 1 capsule (200 mg total) by mouth 3 (three) times daily. Take 1 capsule three times a day. 90 capsule 0   No current facility-administered medications on file prior to visit.     Review of Systems  Constitutional:   No  weight loss, night sweats,  Fevers, chills, + fatigue, or  lassitude.  HEENT:   No headaches,  Difficulty swallowing,  Tooth/dental problems, or  Sore throat,                No sneezing, itching, ear ache, nasal congestion, post nasal drip,   CV:  No chest pain,  Orthopnea, PND, swelling in lower extremities, anasarca, dizziness,  palpitations, syncope.   GI  No heartburn, indigestion, abdominal pain, nausea, vomiting, diarrhea, change in bowel habits, loss of appetite, bloody stools.   Resp:  No chest wall deformity  Skin: no rash or lesions.  GU: no dysuria, change in color of urine, no urgency or frequency.  No flank pain, no hematuria   MS:  No joint pain or swelling.  No decreased range of motion.  No back pain.  Psych:  No change in mood or affect. No depression or anxiety.  No memory loss.          Objective:   Physical Exam GEN: A/Ox3; pleasant , NAD, morbidly obese   VS reviewed   HEENT:  [place]/AT,  EACs-clear, TMs-wnl, NOSE-clear, THROAT-clear, no lesions, no postnasal drip or exudate noted. Class 2-3 MP airway   NECK:  Supple w/ fair ROM; no JVD; normal carotid impulses w/o bruits; no thyromegaly or nodules palpated; no lymphadenopathy.healing trach stoma-pink , no redness , scant drainage .   RESP  Decreased BS in bases , no accessory muscle use, no dullness to percussion  CARD:  RRR, no m/r/g  , tr  peripheral edema, pulses intact, no cyanosis or clubbing.  GI:   Soft & nt; nml bowel sounds; no organomegaly or masses detected.  Musco: Warm bil, no deformities or joint swelling noted.   Neuro: alert, no focal deficits noted.    Skin: Warm, no lesions or rashes, tatoos          Assessment & Plan:

## 2015-04-13 NOTE — Patient Instructions (Addendum)
Chest xray today  Continue on current regimen .  Will get copy of sleep study and be in touch regarding treatment options.  follow up Dr. Craige Cotta  In 6 weeks and As needed   Can call in Dec to see if [place] office is open.  Please contact office for sooner follow up if symptoms do not improve or worsen or seek emergency care

## 2015-04-13 NOTE — Assessment & Plan Note (Signed)
Hx of Severe PNA with ARDS  Clinically improving  Check cxr today  Consider repeat PFT in 3-4 months

## 2015-04-13 NOTE — Progress Notes (Signed)
Dg Chest 2 View  04/13/2015  CLINICAL DATA:  Followup pneumonia. EXAM: CHEST  2 VIEW COMPARISON:  03/06/2015, 03/04/2015 and 08/15/2014 FINDINGS: Lungs are adequately inflated with mild prominence of the perihilar markings suggesting mild vascular congestion. No lobar consolidation or effusion. There is mild stable cardiomegaly. There are degenerative changes of the spine. Stable mild compression deformity over the lumbar spine. IMPRESSION: Mild stable cardiomegaly with findings suggesting mild vascular congestion. Electronically Signed   By: Elberta Fortis M.D.   On: 04/13/2015 14:20   Reviewed and agree with assessment/plan.

## 2015-04-13 NOTE — Progress Notes (Signed)
Quick Note:  Called and spoke with pt. Reviewed results and recs. Pt voiced understanding and had no further questions. ______ 

## 2015-04-13 NOTE — Assessment & Plan Note (Signed)
Controlled on O2  Check for sleep study results.

## 2015-04-28 ENCOUNTER — Telehealth: Payer: Self-pay | Admitting: Adult Health

## 2015-04-28 DIAGNOSIS — J9621 Acute and chronic respiratory failure with hypoxia: Secondary | ICD-10-CM

## 2015-04-28 NOTE — Telephone Encounter (Signed)
Received sleep study results from [facility].   Per TP after reviewing sleep study Please call patient and verify if pt would like to be seen in our office for sleep  or if he has continued care with [facility] since [facility] and spoke with patient. Patient stated that he has not followed up with [facility] and that he would like to be seen in our office for sleep. Patient is currently only using 2L of O2 at bedtime through [facility]. I explained to the patient that I would discuss this with TP and would call back with her recommendations. Patient voiced understanding and had no further questions.  Patient has ov with MR on 05/15/15 at 4:30 pm.

## 2015-04-28 NOTE — Telephone Encounter (Signed)
Spoke with TP and informed her of current sleep study situation  Per TP  Schedule pt for a BiPAP titration study  Called and spoke with patient. Informed him of recs. I explained to him once BiPAP titration study was done I would call him back with TP's recs. Patient voiced understanding and had no further questions. BiPAP titration order placed. Nothing further needed. Will sign off on message.

## 2015-04-29 ENCOUNTER — Telehealth: Payer: Self-pay | Admitting: Adult Health

## 2015-04-29 DIAGNOSIS — G4733 Obstructive sleep apnea (adult) (pediatric): Secondary | ICD-10-CM

## 2015-04-29 NOTE — Telephone Encounter (Addendum)
Per TP after discussing sleep study results with VS  Set up pt on CPAP Auto 10-22 OV in 2 weeks with [facility] and spoke with pt. Informed him of TP's recs. Patient voiced understanding and had no further questions. Order for CPAP has been placed. Will schedule ov once patient receives CPAP. Patient voiced understanding and had no further questions. Will hold in my box until CPAP is received.

## 2015-05-06 NOTE — Telephone Encounter (Signed)
[phone] pt calling back

## 2015-05-06 NOTE — Telephone Encounter (Signed)
Attempted to contact patient to find out if he received his CPAP machine. Left message for patient to call back.

## 2015-05-06 NOTE — Telephone Encounter (Signed)
Spoke with pt, states he received his cpap last week and is having no complaints with it.   Below it states that pt needs a 2 week rov. Pt is already scheduled for 12/16 with MR.  Will this visit be ok?  Thanks!

## 2015-05-06 NOTE — Telephone Encounter (Signed)
Yes that is fine

## 2015-05-13 ENCOUNTER — Encounter: Payer: Self-pay | Admitting: Adult Health

## 2015-05-15 ENCOUNTER — Encounter: Payer: Self-pay | Admitting: Internal Medicine

## 2015-05-15 ENCOUNTER — Ambulatory Visit (INDEPENDENT_AMBULATORY_CARE_PROVIDER_SITE_OTHER): Payer: Medicaid Other | Admitting: Internal Medicine

## 2015-05-15 VITALS — BP 124/88 | HR 68 | Ht 72.0 in | Wt 331.0 lb

## 2015-05-15 DIAGNOSIS — J9611 Chronic respiratory failure with hypoxia: Secondary | ICD-10-CM

## 2015-05-15 DIAGNOSIS — J849 Interstitial pulmonary disease, unspecified: Secondary | ICD-10-CM

## 2015-05-15 NOTE — Progress Notes (Signed)
Subjective:     Patient ID: Bobbie Valletta, male   DOB: 01-26-1961, 54 y.o.   MRN: [id number]  HPI  54 yo male former patient of DR. 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 He had prolonged critical illness requiring tracheostomy. Suffered from Atrial Fib with RVR, shock, and c diff.   He had similar incidence in early 2016 with trach /decannulation .    04/13/2015 Post Hospital follow up  Pt returns for 1 month follow up .  Recently admitted for critical illness. He was admitted earlier this year for severe PNA w/ ARDS requiring prolonged vent support . He did have trach and was later decanulated. Tx in [place].  Presented acutely ill to [facility] in September , with PNA/ARDS requiring intubation on 02/01/15.   He had prolonged critical illness requiring tracheostomy. Suffered from Atrial Fib with RVR, shock, and c diff. He required discharged to IP rehab. Discharged home on 03/19/15.  He was decanulated on 03/16/15 . [name] site is healing well. No redness or fever.  He is Feeling much better.  Walking is doing much better, not having to use walker Remains on Oxygen 2l rest/At bedtime   and 5l/m act (pulse) .  Has been on oxygen since earlier admission in February this year.  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 On disability.  Says he is so much better. Less DOE but gets worn out easily and winded with prolonged walking.  Denies any chest tightness/congestion, sinus pressure, cough, fever, nausea or vomting.   says he had a sleep study  in  , dx with OSA . Suppose to see sleep doc but got admitted.  We requested copy of sleep study.   OV 05/15/2015  Chief Complaint  Patient presents with  . Follow-up    Former PW pt. Pt states his breathing  is unchanged. Pt c/o mild cough with little mucus production with yellow mucus. Pt denies CP/tightness and f/c/s. Pt states he is now using CPAP nightly.     Follow-up post-ARDS related interstitial lung disease. Had ARDSJa 2016 through February 2016 admitted again September 2016 for 3 months with pneumonia and ARDS idiopathic. Autoimmune panel was negative.  No history of HIV check.he was on oxygen 2 L even after the first episode of ARDS.  Today's routine follow-up. Overall he continues to do well. He uses 2 L of oxygen. He says he desaturates without it. He does not have much hoarseness of voice. He says his physical deconditioning is slowly improving. However he is very frustrated by the fact his had recurrent ARDS. He is very upset that his ARDS record. He does not understand why. His autoimmune panel was negative. I do not see evidence of HIV check but he denies that he could have an HIV. He denies drug abuse. He denies acid reflux. His swallowing is okay. His respiratory virus panel was negative in the fall 2016.  Most recent blood work creatinine of 0.84 mg percent October 2016 and a hemoglobin of 10 g percent October 2016.  Walking desaturation test 185 feet 3 laps on room air: 1 lap 83%, HR 182.   Current outpatient prescriptions:  .  colchicine 0.6 MG tablet, Take 0.6 mg by mouth 2 (two) times daily., Disp: , Rfl:  .  famotidine (PEPCID) 20 MG tablet, Take 1 tablet (20 mg total) by mouth 2 (two) times daily., Disp: 60 tablet, Rfl: 1 .  Febuxostat (ULORIC) 80 MG TABS, Take 1 tablet by mouth daily., Disp: , Rfl:  .  fenofibrate (TRICOR) 145 MG tablet, Take 1 tablet (145 mg total) by mouth daily., Disp: 30 tablet, Rfl: 1 .  furosemide (LASIX) 40 MG tablet, Take 1 tablet (40 mg total) by mouth daily with breakfast., Disp: 30 tablet, Rfl: 1 .  HYDROcodone-acetaminophen (NORCO) 10-325 MG tablet, Take 1 tablet by mouth 4 (four) times daily., Disp: , Rfl:  .  metoprolol tartrate (LOPRESSOR) 25  MG tablet, Take 25 mg by mouth 2 (two) times daily., Disp: , Rfl:  .  omeprazole (PRILOSEC) 20 MG capsule, Take 1 capsule (20 mg total) by mouth 2 (two) times daily before a meal., Disp: 60 capsule, Rfl: 1 .  potassium chloride SA (K-DUR,KLOR-CON) 20 MEQ tablet, Take 1 tablet (20 mEq total) by mouth 2 (two) times daily., Disp: 60 tablet, Rfl: 1 .  predniSONE (DELTASONE) 20 MG tablet, Take 1 tablet (20 mg total) by mouth daily with breakfast., Disp: 30 tablet, Rfl: 1 .  pregabalin (LYRICA) 200 MG capsule, Take 1 capsule (200 mg total) by mouth 3 (three) times daily. Take 1 capsule three times a day., Disp: 90 capsule, Rfl: 0 .  rivaroxaban (XARELTO) 20 MG TABS tablet, Take 20 mg by mouth daily with supper., Disp: , Rfl:   Allergies  Allergen Reactions  . Penicillins Hives and Shortness Of Breath  . Hydrochlorothiazide Other (See Comments)    hypercalemia    Immunization History  Administered Date(s) Administered  . Influenza Split 04/22/2014  . Influenza,inj,Quad PF,36+ Mos 02/26/2015  . Pneumococcal Conjugate-13 04/22/2014     Review of Systems Per HPI    Objective:   Physical Exam  Constitutional: He is oriented to person, place, and time. He appears well-developed and well-nourished. No distress.  HENT:  Head: Normocephalic and atraumatic.  Right Ear: External ear normal.  Left Ear: External ear normal.  Mouth/Throat: Oropharynx is clear and moist. No oropharyngeal exudate.  Mallampati class IV Oxygen on Tracheostomy scar present  Eyes: Conjunctivae and EOM are normal. Pupils are equal, round, and reactive to light. Right eye exhibits no discharge. Left eye exhibits no discharge. No scleral icterus.  Neck: Normal range of motion. Neck supple. No JVD present. No tracheal deviation present. No thyromegaly present.  Cardiovascular: Normal rate, regular rhythm and intact distal pulses.  Exam reveals no gallop and no friction rub.   No murmur heard. Pulmonary/Chest: Effort  normal. No respiratory distress. He has no wheezes. He has rales. He exhibits no tenderness.  Crackles present  Abdominal: Soft. Bowel sounds are normal. He exhibits no distension and no mass. There is no tenderness. There is no rebound and no guarding.  Musculoskeletal: Normal range of motion. He exhibits no edema or tenderness.  Lymphadenopathy:    He has no cervical adenopathy.  Neurological: He is alert and oriented to person, place, and time. He has normal reflexes. No cranial nerve deficit. Coordination normal.  Skin: Skin is warm and dry. No rash noted. He is not diaphoretic. No erythema. No pallor.  tatoo  on his scalp  Psychiatric: He has a normal mood and affect. His behavior is normal. Judgment and thought content normal.  Nursing note and vitals reviewed.   Filed Vitals:   05/15/15 1651  BP: 124/88  Pulse: 68  Height: 6' (1.829 m)  Weight: 331 lb (150.141 kg)  SpO2: 97%         Assessment:       ICD-9-CM ICD-10-CM   1. ILD (interstitial lung disease) (HCC) 515 J84.9 CT Chest High Resolution  2. Chronic respiratory failure with hypoxia (HCC) 518.83 J96.11    799.02    unclear cause of recurrent ARDS   conmtinue o2 for pul;se ox goal > 88% Do HRCT chest wo contrast - will call with results (I am out of town 05/24/15 - 06/02/15)  Followup  - 3 months or sooner with results  \    Plan:     conmtinue o2 for pul;se ox goal > 88% Do HRCT chest wo contrast - will call with results (I am out of town 05/24/15 - 06/02/15)  Followup  - 3 months or sooner with result   Dr. Kalman Shan, M.D., [facility].C.P Pulmonary and Critical Care Medicine Staff Physician [facility]  Pulmonary and Critical Care Pager: [phone], If no answer or between  15:00h - 7:00h: call [phone]  05/15/2015 5:33 PM

## 2015-05-15 NOTE — Patient Instructions (Signed)
ICD-9-CM ICD-10-CM   1. ILD (interstitial lung disease) (HCC) 515 J84.9   2. Chronic respiratory failure with hypoxia (HCC) 518.83 J96.11    799.02     conmtinue o2 for pul;se ox goal > 88% Do HRCT chest wo contrast - will call with results (I am out of town 05/24/15 - 06/02/15)  Followup  - 3 months or sooner with results

## 2015-05-26 ENCOUNTER — Ambulatory Visit (INDEPENDENT_AMBULATORY_CARE_PROVIDER_SITE_OTHER)
Admission: RE | Admit: 2015-05-26 | Discharge: 2015-05-26 | Disposition: A | Discharge status: Home / Self Care | Payer: Medicaid Other | Source: Ambulatory Visit | Attending: Internal Medicine | Admitting: Internal Medicine

## 2015-05-26 DIAGNOSIS — J849 Interstitial pulmonary disease, unspecified: Secondary | ICD-10-CM

## 2015-06-19 ENCOUNTER — Telehealth: Payer: Self-pay | Admitting: Internal Medicine

## 2015-06-19 NOTE — Telephone Encounter (Signed)
His CT chest is largely better since MArch 2016 Ct chest and even cXr from sept 2016/Oct 2016. Will see him again in clinic march 2017 per schedule

## 2015-06-22 NOTE — Telephone Encounter (Signed)
lmtcb for pt.  

## 2015-06-23 NOTE — Telephone Encounter (Signed)
Pt returning call for results.Adam Benson ° °

## 2015-06-23 NOTE — Telephone Encounter (Signed)
Pt is aware of results. Nothing further was needed. 

## 2015-06-24 ENCOUNTER — Encounter (HOSPITAL_BASED_OUTPATIENT_CLINIC_OR_DEPARTMENT_OTHER): Payer: Self-pay

## 2015-06-30 ENCOUNTER — Telehealth: Payer: Self-pay | Admitting: Internal Medicine

## 2015-06-30 NOTE — Telephone Encounter (Signed)
Looking in chart, do not see where we have ever filled pt prednisone. According to med list pt takes pred 20 mg QD. Please advise MR if you are okay with refilling this? thanks

## 2015-06-30 NOTE — Telephone Encounter (Signed)
Jasmine December returned call, CB [phone]

## 2015-06-30 NOTE — Telephone Encounter (Signed)
Lm for [facility] with Dr. Melida Quitter office.

## 2015-07-02 NOTE — Telephone Encounter (Signed)
His med list in dec 2016 office visit with me had him on prednisone 20mg  per day. You can refill that

## 2015-07-03 MED ORDER — PREDNISONE 20 MG PO TABS: 20.0000 mg | ORAL_TABLET | Freq: Every day | ORAL | Status: DC

## 2015-07-03 NOTE — Telephone Encounter (Signed)
Spoke with Jasmine December at Dr. Marijean Bravo office, aware that MR is ok with refilling prednisone.  Jasmine December will inform patient.  This rx has been sent.  Nothing further needed.

## 2015-08-19 ENCOUNTER — Ambulatory Visit (INDEPENDENT_AMBULATORY_CARE_PROVIDER_SITE_OTHER)
Admission: RE | Admit: 2015-08-19 | Discharge: 2015-08-19 | Disposition: A | Discharge status: Home / Self Care | Payer: Medicaid Other | Source: Ambulatory Visit | Attending: Internal Medicine | Admitting: Internal Medicine

## 2015-08-19 ENCOUNTER — Ambulatory Visit (INDEPENDENT_AMBULATORY_CARE_PROVIDER_SITE_OTHER): Payer: Medicaid Other | Admitting: Internal Medicine

## 2015-08-19 ENCOUNTER — Encounter: Payer: Self-pay | Admitting: Internal Medicine

## 2015-08-19 VITALS — BP 112/86 | HR 84 | Ht 72.0 in | Wt 364.0 lb

## 2015-08-19 DIAGNOSIS — J849 Interstitial pulmonary disease, unspecified: Secondary | ICD-10-CM

## 2015-08-19 DIAGNOSIS — Z8709 Personal history of other diseases of the respiratory system: Secondary | ICD-10-CM

## 2015-08-19 DIAGNOSIS — J9611 Chronic respiratory failure with hypoxia: Secondary | ICD-10-CM | POA: Diagnosis not present

## 2015-08-19 NOTE — Patient Instructions (Addendum)
ICD-9-CM ICD-10-CM   1. ILD (interstitial lung disease) (HCC) 515 J84.9   2. Chronic respiratory failure with hypoxia (HCC) 518.83 J96.11    799.02    3. History of ARDS V12.69 Z87.09    Overall stable Based on CT scan chest December 2016 improved pulmonary inflammation Unclear basis of lung disease  Plan - Chest x-ray 2 view - Reduce prednisone 10 mg once daily and let us see how he tolerates this - Refer [facility] disease [facility] for second opinion  Follow-up - 3 months or sooner if needed

## 2015-08-19 NOTE — Progress Notes (Signed)
Subjective:     Patient ID: Adam Benson, male   DOB: Oct 25, 1960, 55 y.o.   MRN: [id number]  HPI   55 yo male former patient of DR. 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 He had prolonged critical illness requiring tracheostomy. Suffered from Atrial Fib with RVR, shock, and c diff.   He had similar incidence in early 2016 with trach /decannulation .    04/13/2015 Post Hospital follow up  Pt returns for 1 month follow up .  Recently admitted for critical illness. He was admitted earlier this year for severe PNA w/ ARDS requiring prolonged vent support . He did have trach and was later decanulated. Tx in [place].  Presented acutely ill to [facility] in September , with PNA/ARDS requiring intubation on 02/01/15.   He had prolonged critical illness requiring tracheostomy. Suffered from Atrial Fib with RVR, shock, and c diff. He required discharged to IP rehab. Discharged home on 03/19/15.  He was decanulated on 03/16/15 . [name] site is healing well. No redness or fever.  He is Feeling much better.  Walking is doing much better, not having to use walker Remains on Oxygen 2l rest/At bedtime   and 5l/m act (pulse) .  Has been on oxygen since earlier admission in February this year.  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 On disability.  Says he is so much better. Less DOE but gets worn out easily and winded with prolonged walking.  Denies any chest tightness/congestion, sinus pressure, cough, fever, nausea or vomting.   says he had a sleep study  in [place] , dx with OSA . Suppose to see sleep doc but got admitted.  We requested copy of sleep study.   OV 05/15/2015  Chief Complaint  Patient presents with  . Follow-up    Former PW pt. Pt states his  breathing is unchanged. Pt c/o mild cough with little mucus production with yellow mucus. Pt denies CP/tightness and f/c/s. Pt states he is now using CPAP nightly.     Follow-up post-ARDS related interstitial lung disease. Had ARDSJa 2016 through February 2016 admitted again September 2016 for 3 months with pneumonia and ARDS idiopathic. Autoimmune panel was negative.  No history of HIV check.he was on oxygen 2 L even after the first episode of ARDS.  Today's routine follow-up. Overall he continues to do well. He uses 2 L of oxygen. He says he desaturates without it. He does not have much hoarseness of voice. He says his physical deconditioning is slowly improving. However he is very frustrated by the fact his had recurrent ARDS. He is very upset that his ARDS record. He does not understand why. His autoimmune panel was negative. I do not see evidence of HIV check but he denies that he could have an HIV. He denies drug abuse. He denies acid reflux. His swallowing is okay. His respiratory virus panel was negative in the fall 2016.  Most recent blood work creatinine of 0.84 mg percent October 2016 and a hemoglobin of 10 g percent October 2016.  Walking desaturation test 185 feet 3 laps on room air: 1 lap 83%, HR 182.     OV 08/19/2015  Chief Complaint  Patient presents with  . Follow-up    Pt states when he  ran out of prednisone his breathing worsened. Pt states his breathing is at baseline. Pt c/o non prod cough with chest congestion and chest tightness when active and SOB - resolves with rest.     Follow-up post-ARDS related interstitial lung disease and is obese man with sleep apnea on CPAP  Last seen December 2016. This a 3 month follow-up. Since his last visit in December 2016 he did have CT scan of the chest that showed significant improvement in his pulmonary infiltrates. However he does not feel this improvement. He says that without oxygen he desaturates into the 70s. He has class III  dyspnea on exertion. Oxygen helps and rest helps. He follows up with a local doctor for the sleep apnea. He seemed also locally by a cardiologist who changed his cardiac medications to multaq. This has not helped his dyspnea. In the interim he did run out of daily prednisone and he became more short of breath but he does not know he desaturated when he ran out of prednisone.  He is extremely worried about the etiology and uncertainty with his disease. He is worried about the risk of recurrent ARDS. He also feels that he should not be on daily prednisone. He is open to the idea for second opinion.    has a past medical history of Gout; HTN (hypertension), benign; Chronic atrial fibrillation (HCC); Hyperlipemia; Nerve damage; Obesity; Insomnia; GERD (gastroesophageal reflux disease); History of pneumonia (05/2014 ); History of tracheostomy (HCC) (06/2014>>out 07/2014); Peptic ulcer (04/2014); and Pneumonia.   reports that he has never smoked. His smokeless tobacco use includes Chew.  Past Surgical History  Procedure Laterality Date  . Arm surgery      right  . Appendectomy    . Knee surgery      right    Allergies  Allergen Reactions  . Penicillins Hives and Shortness Of Breath  . Hydrochlorothiazide Other (See Comments)    hypercalemia    Immunization History  Administered Date(s) Administered  . Influenza Split 04/22/2014  . Influenza,inj,Quad PF,36+ Mos 02/26/2015  . Pneumococcal Conjugate-13 04/22/2014    Family History  Problem Relation Age of Onset  . Heart disease Mother   . Diabetes Mother   . Hypertension Mother      Current outpatient prescriptions:  .  colchicine 0.6 MG tablet, Take 0.6 mg by mouth 2 (two) times daily., Disp: , Rfl:  .  dronedarone (MULTAQ) 400 MG tablet, Take 400 mg by mouth 2 (two) times daily with a meal., Disp: , Rfl:  .  famotidine (PEPCID) 20 MG tablet, Take 1 tablet (20 mg total) by mouth 2 (two) times daily., Disp: 60 tablet, Rfl: 1 .   Febuxostat (ULORIC) 80 MG TABS, Take 1 tablet by mouth daily., Disp: , Rfl:  .  fenofibrate (TRICOR) 145 MG tablet, Take 1 tablet (145 mg total) by mouth daily., Disp: 30 tablet, Rfl: 1 .  furosemide (LASIX) 40 MG tablet, Take 1 tablet (40 mg total) by mouth daily with breakfast., Disp: 30 tablet, Rfl: 1 .  HYDROcodone-acetaminophen (NORCO) 10-325 MG tablet, Take 1 tablet by mouth 4 (four) times daily., Disp: , Rfl:  .  metoprolol tartrate (LOPRESSOR) 25 MG tablet, Take 25 mg by mouth 2 (two) times daily., Disp: , Rfl:  .  omeprazole (PRILOSEC) 20 MG capsule, Take 1 capsule (20 mg total) by mouth 2 (two) times daily before a meal., Disp: 60 capsule, Rfl: 1 .  potassium chloride SA (K-DUR,KLOR-CON) 20 MEQ tablet, Take 1 tablet (  20 mEq total) by mouth 2 (two) times daily., Disp: 60 tablet, Rfl: 1 .  predniSONE (DELTASONE) 20 MG tablet, Take 1 tablet (20 mg total) by mouth daily with breakfast., Disp: 30 tablet, Rfl: 1 .  pregabalin (LYRICA) 200 MG capsule, Take 1 capsule (200 mg total) by mouth 3 (three) times daily. Take 1 capsule three times a day., Disp: 90 capsule, Rfl: 0 .  rivaroxaban (XARELTO) 20 MG TABS tablet, Take 20 mg by mouth daily with supper., Disp: , Rfl:  .  torsemide (DEMADEX) 20 MG tablet, Take 20 mg by mouth daily., Disp: , Rfl:     Review of Systems     Objective:   Physical Exam Filed Vitals:   08/19/15 1507  BP: 112/86  Pulse: 84  Height: 6' (1.829 m)  Weight: 364 lb (165.109 kg)  SpO2: 92%   Estimated body mass index is 49.36 kg/(m^2) as calculated from the following:   Height as of this encounter: 6' (1.829 m).   Weight as of this encounter: 364 lb (165.109 kg).  General exam: Obese male HEENT: Mallampati class III-IV. Annia Friendly present. Oxygen on Respiratory exam: Bilateral bibasal crackles present. Notices no wheeze Cardiovascular: Normal heart sounds regular rate and rhythm Abdomen: Obese soft Skin: Scattered crackles present Extremity: No sinus no clubbing  no edema Musculoskeletal:Deformities none    Assessment:       ICD-9-CM ICD-10-CM   1. ILD (interstitial lung disease) (HCC) 515 J84.9   2. Chronic respiratory failure with hypoxia (HCC) 518.83 J96.11    799.02    3. History of ARDS V12.69 Z87.09        Plan:      Overall stable Based on CT scan chest December 2016 improved pulmonary inflammation Unclear basis of lung disease  Plan - Chest x-ray 2 view - Reduce prednisone 10 mg once daily and let us see how he tolerates this - Refer [facility] for second opinion  Follow-up - 3 months or sooner if needed   (> 50% of this 15 min visit spent in face to face counseling or/and coordination of care)  Dr. Kalman Shan, M.D., [facility].C.P Pulmonary and Critical Care Medicine Staff Physician [facility] Pulmonary and Critical Care Pager: [phone], If no answer or between  15:00h - 7:00h: call [phone]  08/19/2015 3:34 PM

## 2015-08-24 ENCOUNTER — Telehealth: Payer: Self-pay | Admitting: Internal Medicine

## 2015-08-24 NOTE — Telephone Encounter (Signed)
Spoke with pt, aware of results/recs.  Nothing further needed.  

## 2015-08-24 NOTE — Telephone Encounter (Signed)
lmtcb for pt.  

## 2015-08-24 NOTE — Telephone Encounter (Signed)
LEt Jeffory Shafiq know that some amount of inflammatoion is still there on CXR similar to before. Good plan to get 2nd opinion from [facility] - they might do their own CT

## 2015-09-22 ENCOUNTER — Other Ambulatory Visit: Payer: Self-pay | Admitting: Emergency Medicine

## 2015-09-22 MED ORDER — PREDNISONE 10 MG PO TABS: 10.0000 mg | ORAL_TABLET | Freq: Every day | ORAL | Status: DC

## 2015-11-30 ENCOUNTER — Ambulatory Visit: Payer: Self-pay | Admitting: Internal Medicine

## 2015-12-02 IMAGING — DX DG CHEST 2V
2 series · 2 of 2 positions shown · non-contrast
Comparison: 03/06/2015, 03/04/2015 and 08/15/2014

CLINICAL DATA: Followup pneumonia.

EXAM:
CHEST  2 VIEW

[chest pa]
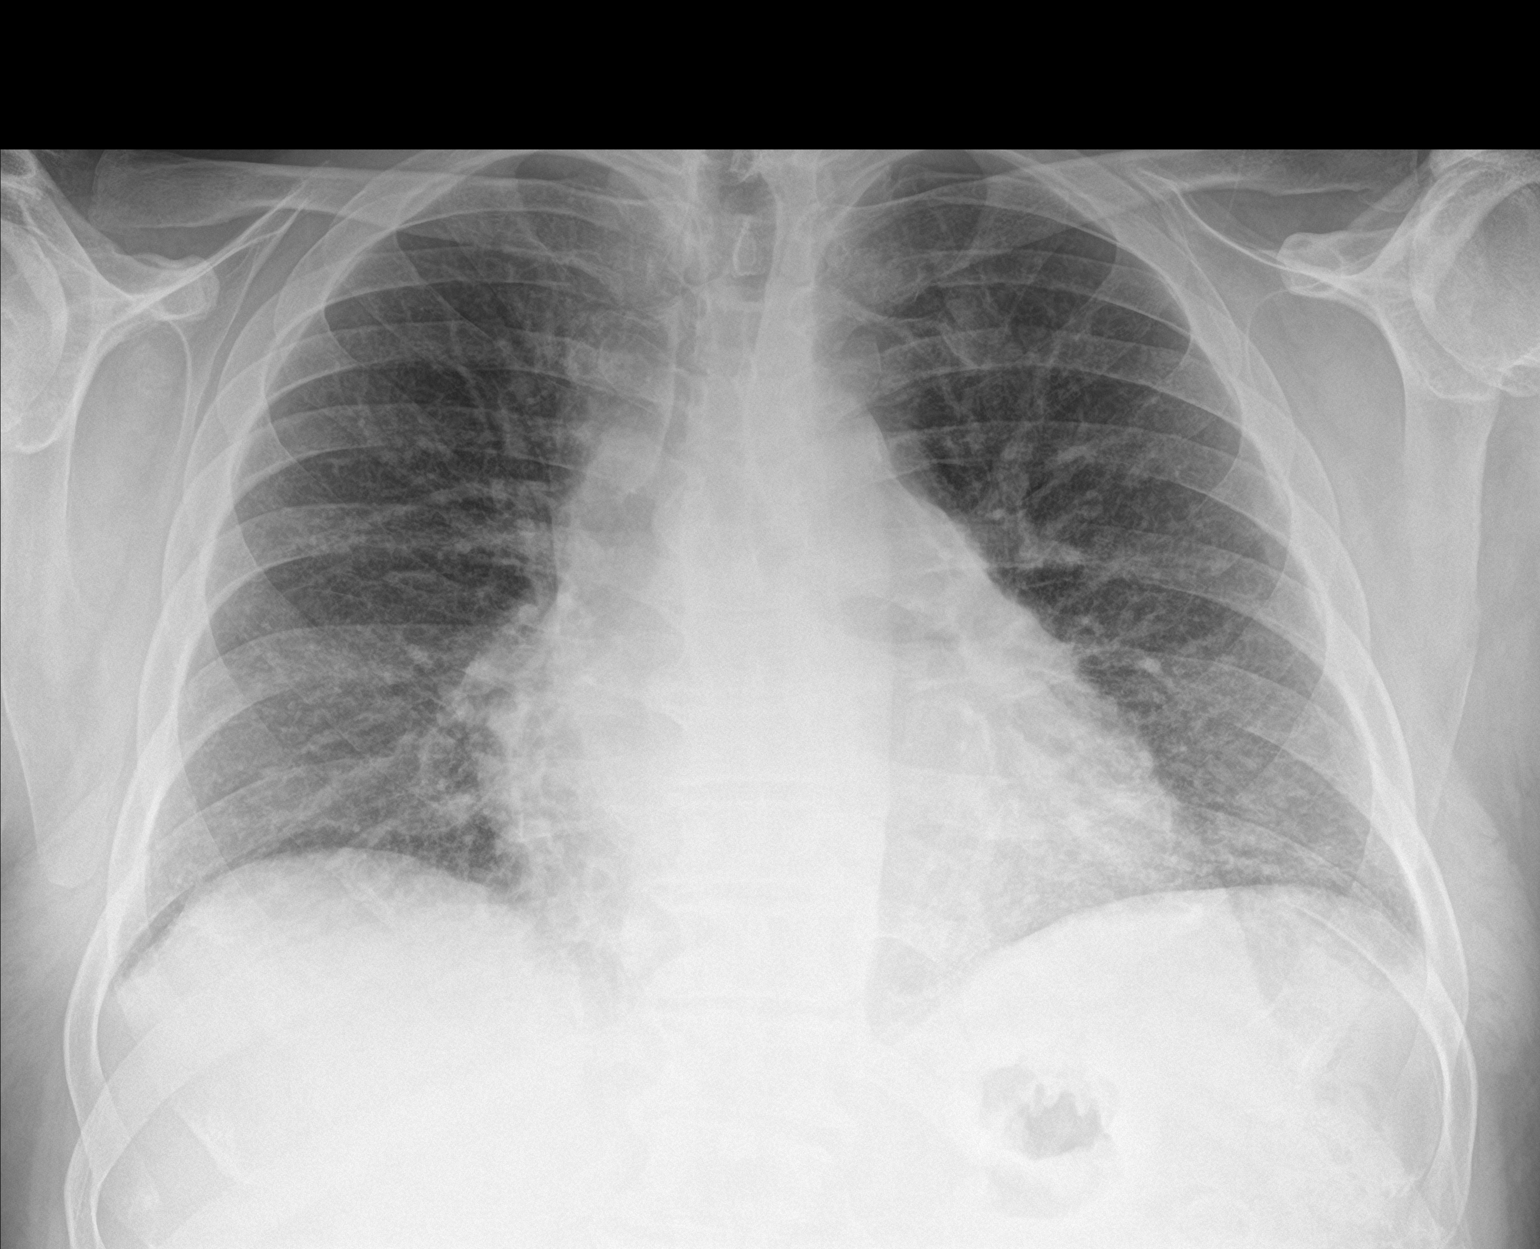

[chest lat]
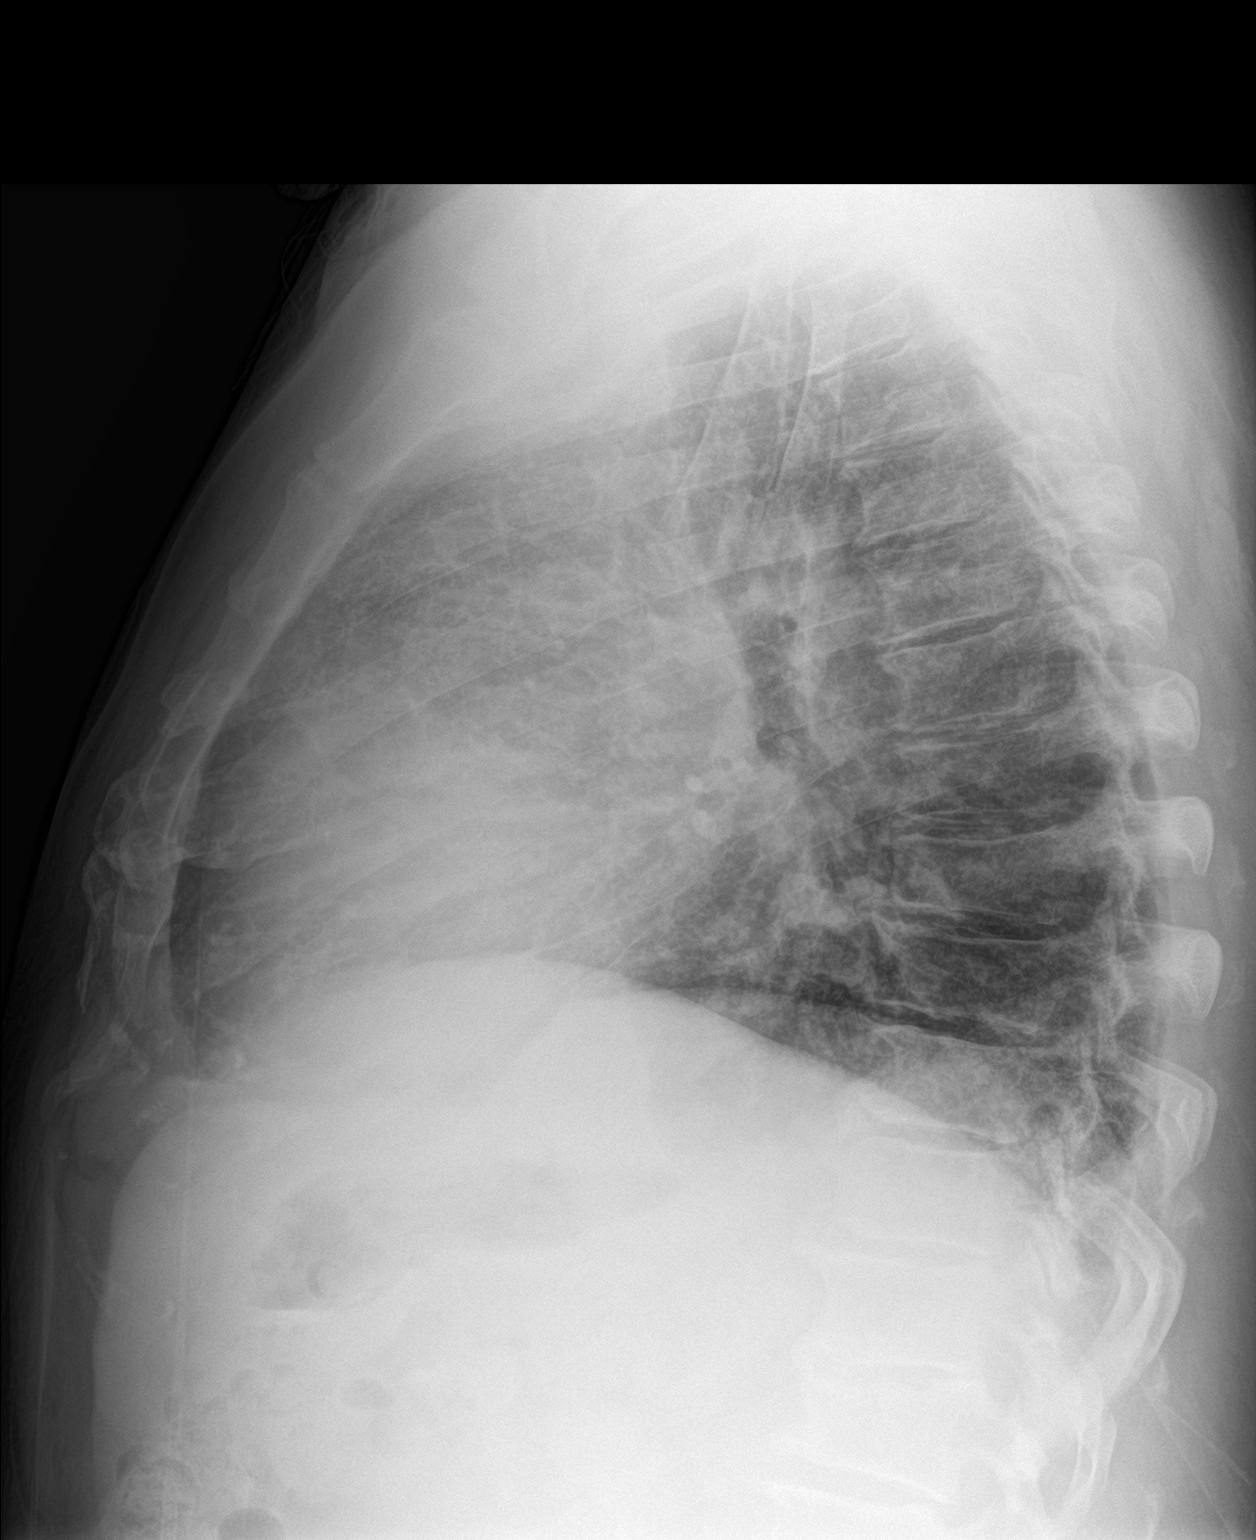

[2 of 2 positions shown; findings below may reference images not displayed]

FINDINGS: Lungs are adequately inflated with mild prominence of the perihilar
markings suggesting mild vascular congestion. No lobar consolidation
or effusion. There is mild stable cardiomegaly. There are
degenerative changes of the spine. Stable mild compression deformity
over the lumbar spine.
IMPRESSION: Mild stable cardiomegaly with findings suggesting mild vascular
congestion.

## 2015-12-17 ENCOUNTER — Ambulatory Visit (INDEPENDENT_AMBULATORY_CARE_PROVIDER_SITE_OTHER): Payer: Medicaid Other | Admitting: Internal Medicine

## 2015-12-17 ENCOUNTER — Encounter: Payer: Self-pay | Admitting: Internal Medicine

## 2015-12-17 VITALS — BP 144/78 | HR 95 | Ht 72.0 in | Wt 376.0 lb

## 2015-12-17 DIAGNOSIS — J9611 Chronic respiratory failure with hypoxia: Secondary | ICD-10-CM | POA: Diagnosis not present

## 2015-12-17 DIAGNOSIS — J849 Interstitial pulmonary disease, unspecified: Secondary | ICD-10-CM

## 2015-12-17 DIAGNOSIS — Z8709 Personal history of other diseases of the respiratory system: Secondary | ICD-10-CM

## 2015-12-17 NOTE — Progress Notes (Signed)
Subjective:     Patient ID: Adam Benson, male   DOB: [birth year], 55 y.o.   MRN: [id number]  HPI    55 yo male former patient of DR. 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 He had prolonged critical illness requiring tracheostomy. Suffered from Atrial Fib with RVR, shock, and c diff.   He had similar incidence in early 2016 with trach /decannulation .    04/13/2015 Post Hospital follow up  Pt returns for 1 month follow up .  Recently admitted for critical illness. He was admitted earlier this year for severe PNA w/ ARDS requiring prolonged vent support . He did have trach and was later decanulated. Tx in [place].  Presented acutely ill to [facility] in September , with PNA/ARDS requiring intubation on 02/01/15.   He had prolonged critical illness requiring tracheostomy. Suffered from Atrial Fib with RVR, shock, and c diff. He required discharged to IP rehab. Discharged home on 03/19/15.  He was decanulated on 03/16/15 . [name] site is healing well. No redness or fever.  He is Feeling much better.  Walking is doing much better, not having to use walker Remains on Oxygen 2l rest/At bedtime   and 5l/m act (pulse) .  Has been on oxygen since earlier admission in February this year.  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 On disability.  Says he is so much better. Less DOE but gets worn out easily and winded with prolonged walking.  Denies any chest tightness/congestion, sinus pressure, cough, fever, nausea or vomting.   says he had a sleep study  in [place] , dx with OSA . Suppose to see sleep doc but got admitted.  We requested copy of sleep study.   OV 05/15/2015  Chief Complaint  Patient presents with  . Follow-up    Former PW pt. Pt states his  breathing is unchanged. Pt c/o mild cough with little mucus production with yellow mucus. Pt denies CP/tightness and f/c/s. Pt states he is now using CPAP nightly.     Follow-up post-ARDS related interstitial lung disease. Had ARDSJa 2016 through February 2016 admitted again September 2016 for 3 months with pneumonia and ARDS idiopathic. Autoimmune panel was negative.  No history of HIV check.he was on oxygen 2 L even after the first episode of ARDS.  Today's routine follow-up. Overall he continues to do well. He uses 2 L of oxygen. He says he desaturates without it. He does not have much hoarseness of voice. He says his physical deconditioning is slowly improving. However he is very frustrated by the fact his had recurrent ARDS. He is very upset that his ARDS record. He does not understand why. His autoimmune panel was negative. I do not see evidence of HIV check but he denies that he could have an HIV. He denies drug abuse. He denies acid reflux. His swallowing is okay. His respiratory virus panel was negative in the fall 2016.  Most recent blood work creatinine of 0.84 mg percent October 2016 and a hemoglobin of 10 g percent October 2016.  Walking desaturation test 185 feet 3 laps on room air: 1 lap 83%, HR 182.     OV 08/19/2015  Chief Complaint  Patient presents with  . Follow-up    Pt states when  he ran out of prednisone his breathing worsened. Pt states his breathing is at baseline. Pt c/o non prod cough with chest congestion and chest tightness when active and SOB - resolves with rest.     Follow-up post-ARDS related interstitial lung disease and is obese man with sleep apnea on CPAP  Last seen December 2016. This a 3 month follow-up. Since his last visit in December 2016 he did have CT scan of the chest that showed significant improvement in his pulmonary infiltrates. However he does not feel this improvement. He says that without oxygen he desaturates into the 70s. He has class III  dyspnea on exertion. Oxygen helps and rest helps. He follows up with a local doctor for the sleep apnea. He seemed also locally by a cardiologist who changed his cardiac medications to multaq. This has not helped his dyspnea. In the interim he did run out of daily prednisone and he became more short of breath but he does not know he desaturated when he ran out of prednisone.  He is extremely worried about the etiology and uncertainty with his disease. He is worried about the risk of recurrent ARDS. He also feels that he should not be on daily prednisone. He is open to the idea for second opinion.   OV 12/17/2015  Chief Complaint  Patient presents with  . Follow-up    [facility] has him on 9 liters O2. reports breathing is getting worse, wheezing, chest tightness, prod cough (cream color phlem).    Follow-up post-ARDS related interstitial lung disease. Obese man with sleep apnea on CPAP  Last visit in 08/19/2015. Chest x-ray at that time showed persistence of infiltrates. I referred him to [facility]. He saw Dr. Glynda Jaeger 11/30/2015. I personally reviewed the note. Dr. Glynda Jaeger is cutting down the prednisone 5 mg daily. There is no change in his dyspnea baseline cough. The main issues that he continues to have rapidly fluctuating weight issues. He is on diuresis. He has seen his cardiologist Dr. Sampson Goon at [facility]. Dr. Glynda Jaeger at [facility] is recommending right heart catheterization the patient seems to forgotten about this. According to the patient is no active appointment pending for right heart catheterization but he is open to having this procedure. He understands that weight loss is the key to long-term health   Review of Systems     Objective:   Physical Exam  Constitutional: He is oriented to person, place, and time. He appears well-developed and well-nourished. No distress.  Body mass index is 50.98 kg/(m^2).   HENT:  Head: Normocephalic and atraumatic.   Right Ear: External ear normal.  Left Ear: External ear normal.  Mouth/Throat: Oropharynx is clear and moist. No oropharyngeal exudate.  02 on  Eyes: Conjunctivae and EOM are normal. Pupils are equal, round, and reactive to light. Right eye exhibits no discharge. Left eye exhibits no discharge. No scleral icterus.  Neck: Normal range of motion. Neck supple. No JVD present. No tracheal deviation present. No thyromegaly present.  Cardiovascular: Normal rate, regular rhythm and intact distal pulses.  Exam reveals no gallop and no friction rub.   No murmur heard. Pulmonary/Chest: Effort normal. No respiratory distress. He has no wheezes. He has rales. He exhibits no tenderness.  Abdominal: Soft. Bowel sounds are normal. He exhibits no distension and no mass. There is no tenderness. There is no rebound and no guarding.  Musculoskeletal: Normal range of motion. He exhibits edema. He exhibits no tenderness.  Chronic venostasis edema  present  Lymphadenopathy:    He has no cervical adenopathy.  Neurological: He is alert and oriented to person, place, and time. He has normal reflexes. No cranial nerve deficit. Coordination normal.  Skin: Skin is warm and dry. No rash noted. He is not diaphoretic. No erythema. No pallor.  Psychiatric: He has a normal mood and affect. His behavior is normal. Judgment and thought content normal.  Nursing note and vitals reviewed.   Filed Vitals:   12/17/15 0948  BP: 144/78  Pulse: 95  Height: 6' (1.829 m)  Weight: 376 lb (170.552 kg)  SpO2: 96%        Assessment:       ICD-9-CM ICD-10-CM   1. ILD (interstitial lung disease) (HCC) 515 J84.9   2. Chronic respiratory failure with hypoxia (HCC) 518.83 J96.11    799.02    3. History of ARDS V12.69 Z87.09         Plan:      Agree with lowering dose of prednisone Continue oxygen as needed for pulse ox goal greater than 88%; currently using 9 L with exertion Overall clinically stable health I will try to  touch base with Dr. Hessie Dibble at [facility] - I have already emailed  -  see who will do a right heart catheterization  - If needed I can refer you to Dr. Shirlee Latch or Dr. Gala Romney at our center  Follow-up 3 months or sooner if needed    (> 50% of this 15 min visit spent in face to face counseling or/and coordination of care)   Dr. Kalman Shan, M.D., [facility].C.P Pulmonary and Critical Care Medicine Staff Physician [place] [facility] Pulmonary and Critical Care Pager: [phone], If no answer or between  15:00h - 7:00h: call [phone]  12/17/2015 10:16 AM

## 2015-12-17 NOTE — Patient Instructions (Signed)
ICD-9-CM ICD-10-CM   1. ILD (interstitial lung disease) (HCC) 515 J84.9   2. Chronic respiratory failure with hypoxia (HCC) 518.83 J96.11    799.02    3. History of ARDS V12.69 Z87.09    Agree with lowering dose of prednisone Continue oxygen as needed for pulse ox goal greater than 88%; currently using 9 L with exertion Overall clinically stable health I will try to touch base with Dr. Hessie Dibble at [facility]   -  see who will do a right heart catheterization  - If needed I can refer you to Dr. Shirlee Latch or Dr. Gala Romney at our center  Follow-up 3 months or sooner if needed

## 2015-12-31 ENCOUNTER — Other Ambulatory Visit: Payer: Self-pay

## 2015-12-31 MED ORDER — PREDNISONE 10 MG PO TABS: 5.0000 mg | ORAL_TABLET | Freq: Every day | ORAL | 2 refills | Status: DC

## 2016-01-14 IMAGING — CT CT CHEST HIGH RESOLUTION W/O CM
2 of 6 series · 15 of 36 positions shown, 18 images · non-contrast
Comparison: 08/07/2014 and 01/26/2011.

CLINICAL DATA: Hospitalized with pneumonia to 3 months ago. On
oxygen therapy ever since discharge. Shortness of breath with
exertion.

EXAM:
CT CHEST WITHOUT CONTRAST
TECHNIQUE: Multidetector CT imaging of the chest was performed following the
standard protocol without intravenous contrast. High resolution
imaging of the lungs, as well as inspiratory and expiratory imaging,
was performed.

[Series 3: high resolution · axial · 0.84mm/px · z∈[-110,+134]mm · 12 of 57 slices shown, 15 images]
[im 4/57  mediastinal]
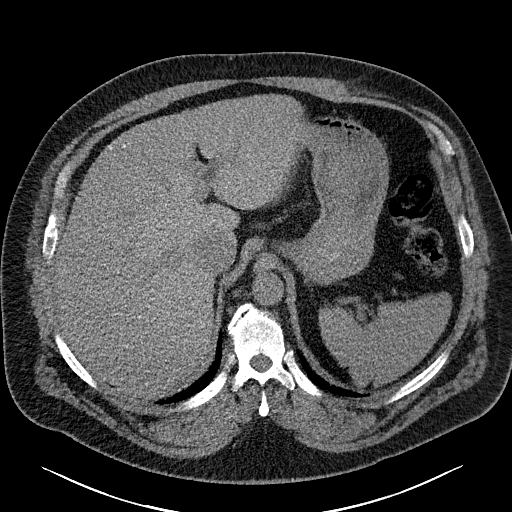
[im 4/57  lung]
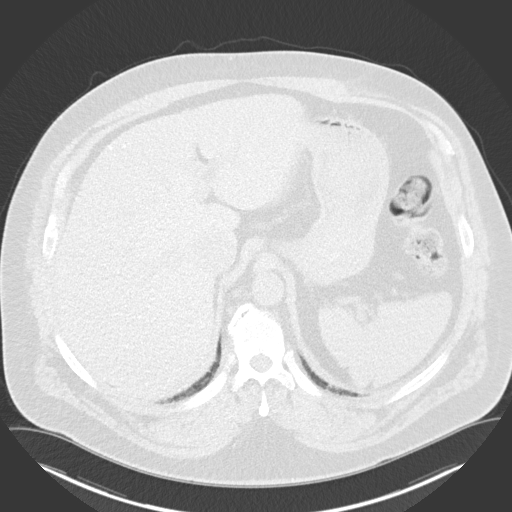
[im 8/57  lung]
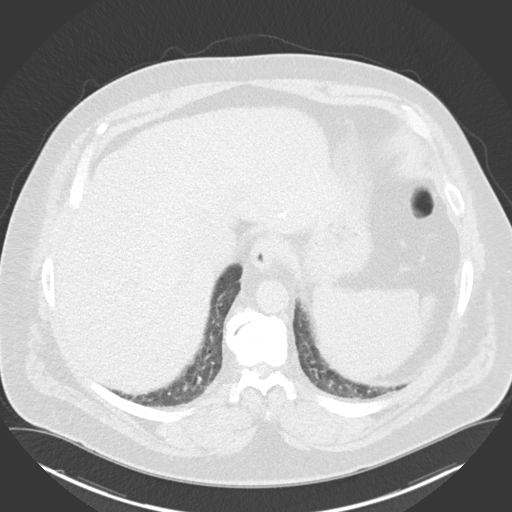
[im 12/57  lung]
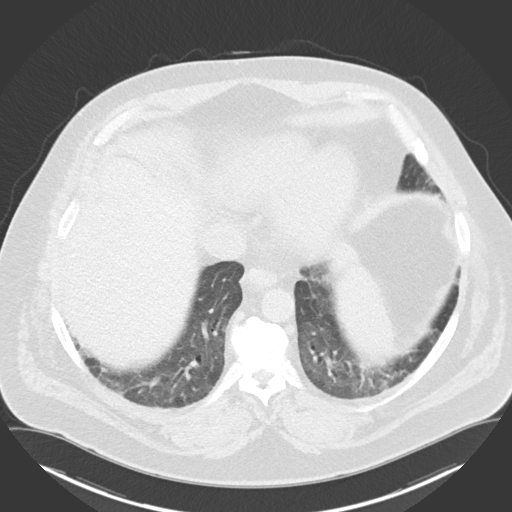
[im 19/57  lung]
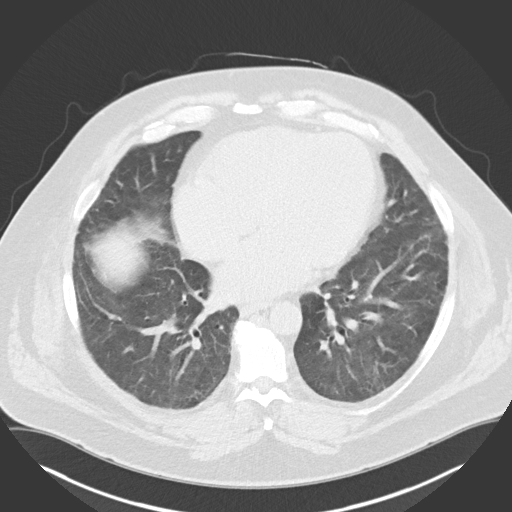
[im 23/57  mediastinal]
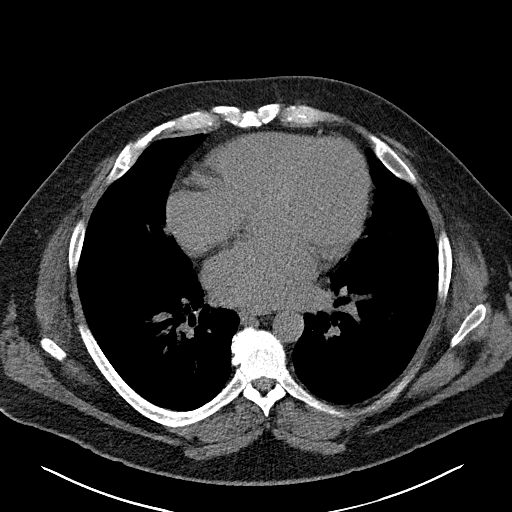
[im 23/57  lung]
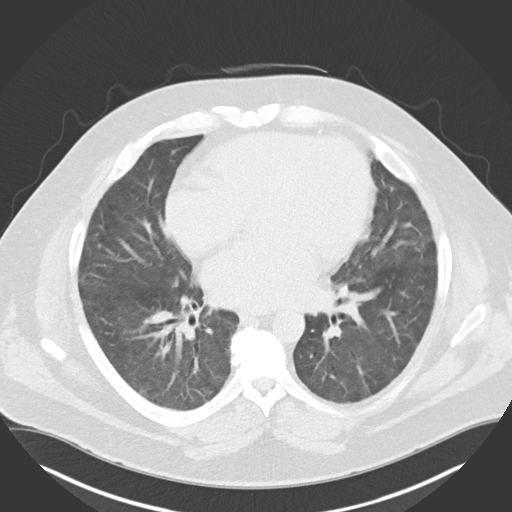
[im 27/57  lung]
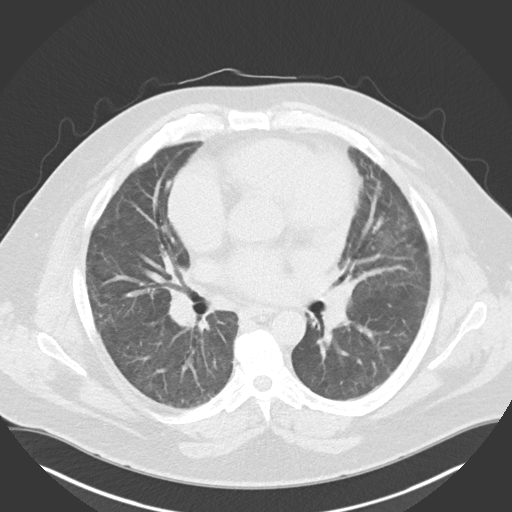
[im 30/57  lung]
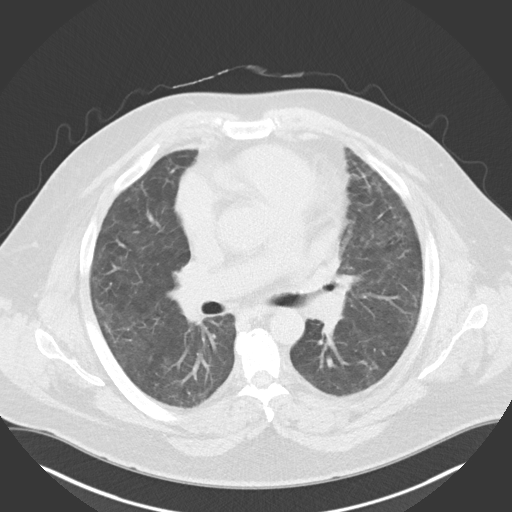
[im 34/57  lung]
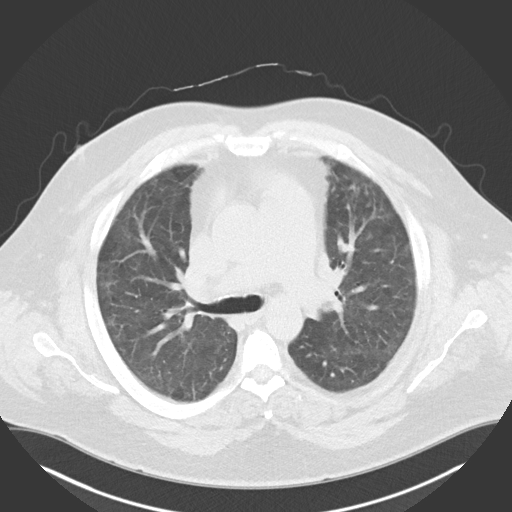
[im 38/57  mediastinal]
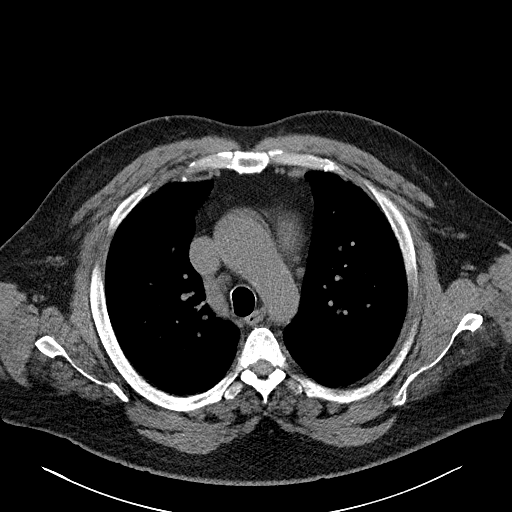
[im 38/57  lung]
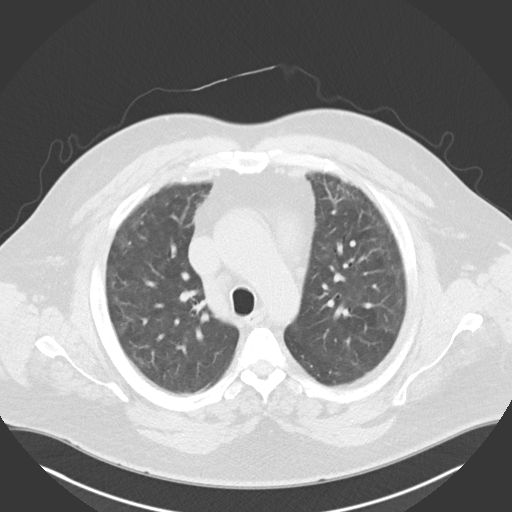
[im 45/57  lung]
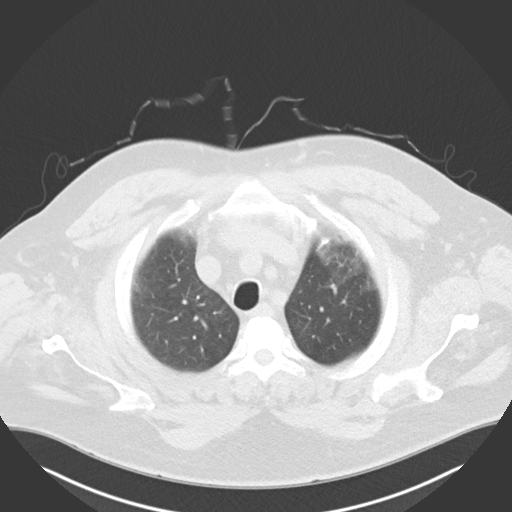
[im 49/57  lung]
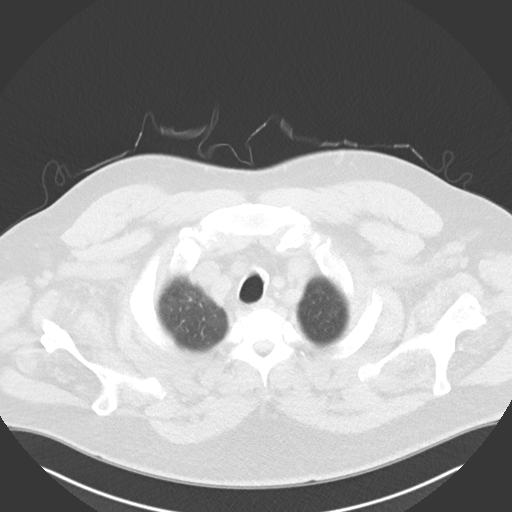
[im 53/57  lung]
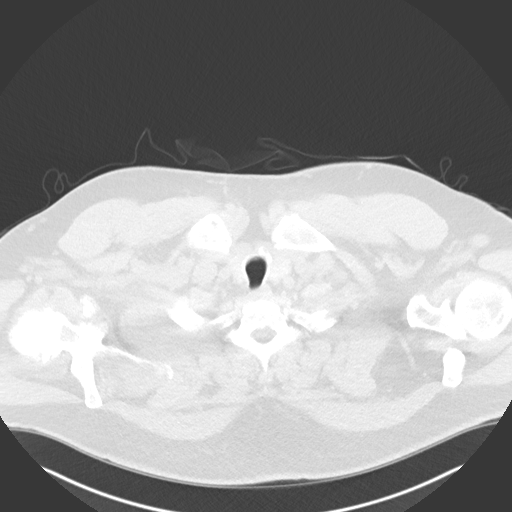

[Series 8: coronal · coronal · 0.59mm/px · 3 of 153 slices shown]
[im 31/153  lung]
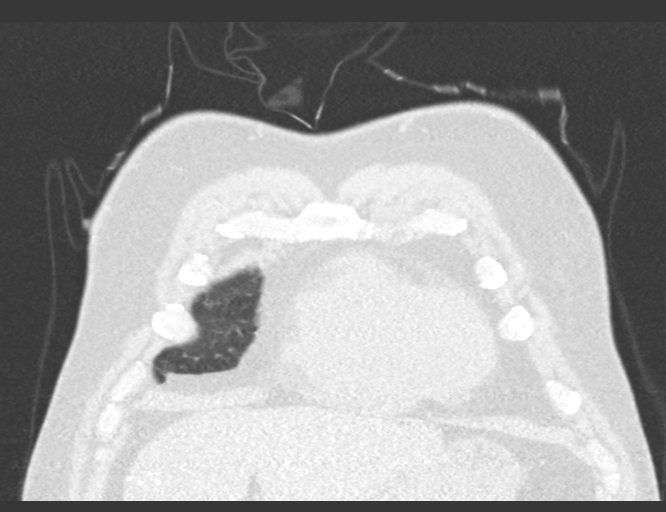
[im 61/153  lung]
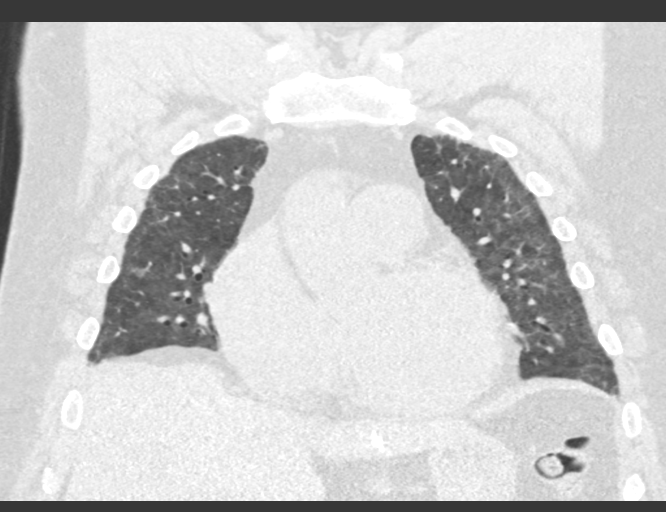
[im 92/153  lung]
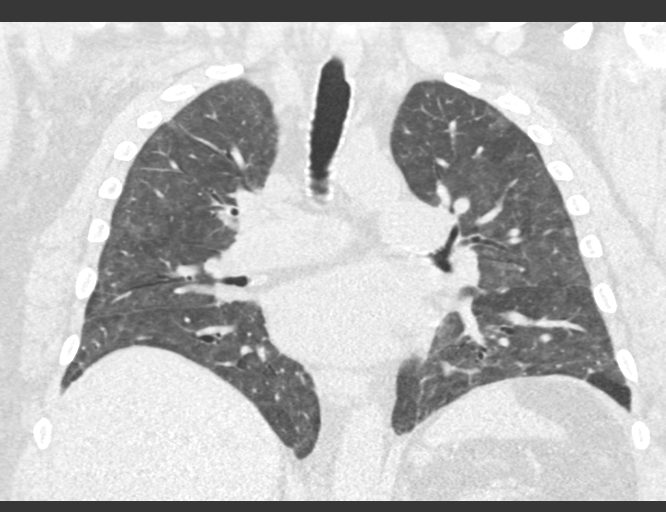

[15 of 36 positions shown; findings below may reference images not displayed]

FINDINGS: Mediastinum/Lymph Nodes: Mediastinal lymph nodes are not enlarged by
CT size criteria. No axillary adenopathy. Hilar regions are
difficult to definitively evaluate without IV contrast. Pulmonary
arteries appear enlarged, as does the heart. No pericardial
effusion.

Lungs/Pleura: There is patchy pulmonary parenchymal ground-glass
with mild coarsening and minimal subpleural reticulation. No zonal
predominance. Findings are fairly diffuse and likely the residua of
acute appearing ground-glass seen in the lungs on 08/07/2014. No
traction bronchiectasis or honeycombing. There may be minimal air
trapping. No pleural fluid. Airway is unremarkable.

Upper abdomen: Visualized portions of the liver, adrenal glands,
spleen and stomach are grossly unremarkable.

Musculoskeletal: No worrisome lytic or sclerotic lesions.
IMPRESSION: 1. Pulmonary parenchymal pattern of ground-glass and mild
coarsening, with minimal septal thickening and no definite zonal
predominance, possibly due to postinflammatory fibrosis. Nonspecific
interstitial pneumonitis is also considered. Findings are not
consistent with usual interstitial pneumonitis.
2. Enlarged pulmonary arteries, indicative of pulmonary arterial
hypertension.

## 2016-01-27 ENCOUNTER — Telehealth: Payer: Self-pay | Admitting: Internal Medicine

## 2016-01-27 NOTE — Telephone Encounter (Signed)
Called spoke with pt. He reports Dr. Sampson Goon is needing surgical clearance for a "heart surgery". He did not know the name of the procedure.  I called Dr. Jarrett Ables office and they reports pt is needing clearance for possible pulmonary vein cryoablation or atrial flutter ablation.  Please advise MR thanks

## 2016-01-29 NOTE — Telephone Encounter (Signed)
Called and spoke with Dr. Jarrett Ables nurse Toniann Fail, states that Dr. Sampson Goon is on vacation until Tuesday.  I've left a message with Toniann Fail for Dr. Sampson Goon to call MR on his cell (number provided to Caroleen) upon his return.  Forwarding to MR to make aware.

## 2016-01-29 NOTE — Telephone Encounter (Signed)
Pleae have Dr Gavin Pound call me on my cell phone; best if this is discussed on phone  Thanks  Dr. Kalman Shan, M.D., [facility].C.P Pulmonary and Critical Care Medicine Staff Physician [place] [facility] [place] Pulmonary and Critical Care Pager: [phone], If no answer or between  15:00h - 7:00h: call [phone]  01/29/2016 10:04 AM

## 2016-01-29 NOTE — Telephone Encounter (Signed)
MR please advise. Pt was last seen by MR on 12/17/15.  thanks

## 2016-02-02 NOTE — Telephone Encounter (Signed)
D/with DR Sampson Goon 9:37 AM 02/02/2016  and reviewed case  - 01/01/16 - Dr Glynda Jaeger email asked me to arrange Right heart cath locally - [place] to do this first with Dr Shirlee Latch or [place] and then he will review need for A Fib ablation  So, please refer Adam Benson to our [facility] -Shirlee Latch or [facility] for Right heart cath to be done next few weeks  THanks  Dr. Kalman Shan, M.D., [facility].C.P Pulmonary and Critical Care Medicine Staff Physician [facility] [place] Pulmonary and Critical Care Pager: [phone], If no answer or between  15:00h - 7:00h: call [phone]  02/02/2016 9:37 AM

## 2016-02-02 NOTE — Telephone Encounter (Signed)
ATC x 3  Automated message stating that the number we are calling is currently not available.  wcb

## 2016-02-03 NOTE — Telephone Encounter (Signed)
Attempted to call pt. Received a recording that this number is not working at this time. Will try back later.

## 2016-02-04 NOTE — Telephone Encounter (Signed)
Attempted to call pt. Received a recording that my call could not be completed as dialed. Will try back later.

## 2016-02-05 NOTE — Telephone Encounter (Signed)
I have attempted to contact the pt again. I received a fast busy signal again. I tried to call the pt's emergency contact, which is his father. Received a message that my call could not be completed as dialed. Will send this message to MR per Robynn Pane to let him know that we have tried to reach the pt several times with no success.

## 2016-02-09 NOTE — Telephone Encounter (Signed)
Please try calling after 1 week. IF still does not answer send registered letter per policy  Dr. Kalman Shan, M.D., [facility].C.P Pulmonary and Critical Care Medicine Staff Physician [place] [facility]  Pulmonary and Critical Care Pager: [phone], If no answer or between  15:00h - 7:00h: call [phone]  02/09/2016 6:17 AM

## 2016-04-04 ENCOUNTER — Ambulatory Visit (INDEPENDENT_AMBULATORY_CARE_PROVIDER_SITE_OTHER): Payer: Medicaid Other | Admitting: Internal Medicine

## 2016-04-04 ENCOUNTER — Encounter: Payer: Self-pay | Admitting: Internal Medicine

## 2016-04-04 VITALS — BP 118/60 | HR 75 | Ht 72.0 in | Wt 353.0 lb

## 2016-04-04 DIAGNOSIS — J849 Interstitial pulmonary disease, unspecified: Secondary | ICD-10-CM | POA: Diagnosis not present

## 2016-04-04 DIAGNOSIS — Z8709 Personal history of other diseases of the respiratory system: Secondary | ICD-10-CM | POA: Diagnosis not present

## 2016-04-04 DIAGNOSIS — J9611 Chronic respiratory failure with hypoxia: Secondary | ICD-10-CM

## 2016-04-04 NOTE — Progress Notes (Signed)
Subjective:     Patient ID: Adam Benson, male   DOB: June 02, 1960, 55 y.o.   MRN: [id number]  HPI  55 yo male former patient of DR. 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 consult after transfer from [facility] with Acute resp Failure with PNA /ARDS requiring intubation 02/01/15 . He had prolonged critical illness requiring tracheostomy. Suffered from Atrial Fib with RVR, shock, and c diff.   He had similar incidence in early 2016 with trach /decannulation .    04/13/2015 Post Hospital follow up  Pt returns for 1 month follow up .  Recently admitted for critical illness. He was admitted earlier this year for severe PNA w/ ARDS requiring prolonged vent support . He did have trach and was later decanulated. Tx in [place].  Presented acutely ill to [facility] in September , with PNA/ARDS requiring intubation on 02/01/15.   He had prolonged critical illness requiring tracheostomy. Suffered from Atrial Fib with RVR, shock, and c diff. He required discharged to IP rehab. Discharged home on 03/19/15.  He was decanulated on 03/16/15 . [name] site is healing well. No redness or fever.  He is Feeling much better.  Walking is doing much better, not having to use walker Remains on Oxygen 2l rest/At bedtime   and 5l/m act (pulse) .  Has been on oxygen since earlier admission in February this year.  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 On disability.  Says he is so much better. Less DOE but gets worn out easily and winded with prolonged walking.  Denies any chest tightness/congestion, sinus pressure, cough, fever, nausea or vomting.   says he had a sleep study  in [place] , dx with OSA . Suppose to see sleep doc but got admitted.  We requested copy of sleep study.   OV 05/15/2015  Chief Complaint  Patient presents with  . Follow-up    Former PW pt. Pt states his breathing  is unchanged. Pt c/o mild cough with little mucus production with yellow mucus. Pt denies CP/tightness and f/c/s. Pt states he is now using CPAP nightly.     Follow-up post-ARDS related interstitial lung disease. Had ARDSJa 2016 through February 2016 admitted again September 2016 for 3 months with pneumonia and ARDS idiopathic. Autoimmune panel was negative.  No history of HIV check.he was on oxygen 2 L even after the first episode of ARDS.  Today's routine follow-up. Overall he continues to do well. He uses 2 L of oxygen. He says he desaturates without it. He does not have much hoarseness of voice. He says his physical deconditioning is slowly improving. However he is very frustrated by the fact his had recurrent ARDS. He is very upset that his ARDS record. He does not understand why. His autoimmune panel was negative. I do not see evidence of HIV check but he denies that he could have an HIV. He denies drug abuse. He denies acid reflux. His swallowing is okay. His respiratory virus panel was negative in the fall 2016.  Most recent blood work creatinine of 0.84 mg percent October 2016 and a hemoglobin of 10 g percent October 2016.  Walking desaturation test 185 feet 3 laps on room air: 1 lap 83%, HR 182.     OV 08/19/2015  Chief Complaint  Patient presents with  . Follow-up    Pt states when he ran  out of prednisone his breathing worsened. Pt states his breathing is at baseline. Pt c/o non prod cough with chest congestion and chest tightness when active and SOB - resolves with rest.     Follow-up post-ARDS related interstitial lung disease and is obese man with sleep apnea on CPAP  Last seen December 2016. This a 3 month follow-up. Since his last visit in December 2016 he did have CT scan of the chest that showed significant improvement in his pulmonary infiltrates. However he does not feel this improvement. He says that without oxygen he desaturates into the 70s. He has class III dyspnea on  exertion. Oxygen helps and rest helps. He follows up with a local doctor for the sleep apnea. He seemed also locally by a cardiologist who changed his cardiac medications to multaq. This has not helped his dyspnea. In the interim he did run out of daily prednisone and he became more short of breath but he does not know he desaturated when he ran out of prednisone.  He is extremely worried about the etiology and uncertainty with his disease. He is worried about the risk of recurrent ARDS. He also feels that he should not be on daily prednisone. He is open to the idea for second opinion.   OV 12/17/2015  Chief Complaint  Patient presents with  . Follow-up    [facility] has him on 9 liters O2. reports breathing is getting worse, wheezing, chest tightness, prod cough (cream color phlem).    Follow-up post-ARDS related interstitial lung disease. Obese man with sleep apnea on CPAP  Last visit in 08/19/2015. Chest x-ray at that time showed persistence of infiltrates. I referred him to [facility]. He saw Dr. Glynda Jaeger 11/30/2015. I personally reviewed the note. Dr. Glynda Jaeger is cutting down the prednisone 5 mg daily. There is no change in his dyspnea baseline cough. The main issues that he continues to have rapidly fluctuating weight issues. He is on diuresis. He has seen his cardiologist Dr. Sampson Goon at [facility]. Dr. Glynda Jaeger at [facility] is recommending right heart catheterization the patient seems to forgotten about this. According to the patient is no active appointment pending for right heart catheterization but he is open to having this procedure. He understands that weight loss is the key to long-term health    OV 04/04/2016  Chief Complaint  Patient presents with  . Follow-up    Breathing is not doing good today. Breathing is only good sitting down for a long period of time. Winded using the bathroom. SOB w/ excertion causing tightness and congestion. Cough/ w greyish  brown mucus.      Follow-up post-ARDS related interstitial lung disease. Obese man with sleep apnea on CPAP   Last vist July 2017. This visit was supposed to be post right heart cath that ws recommeneded at [facility] given his significant daily weight changes. [facility] and DR Fitzgeral preferred be done locally. Chart review indicates my office could not get hold of him to set up referral to Dr McLean/Bensimohn. He says he never got a call from  [place]. In any event, overall same. USes 7L o2. Still with daily weight changes. Says Dr Randel Pigg his cardiologist is going to try cardioverting his Atrial Fibrillation for 4th time. He states he is pessmistic about its success . No other acute or chronic symptoms. On lower daily prednisone 5mg  per day; seeoms to be keeping lung stable. Last CT chest was  1 year ago    has  a past medical history of Chronic atrial fibrillation (HCC); GERD (gastroesophageal reflux disease); Gout; History of pneumonia (05/2014 ); History of tracheostomy (06/2014>>out 07/2014); HTN (hypertension), benign; Hyperlipemia; Insomnia; Nerve damage; Obesity; Peptic ulcer (04/2014); and Pneumonia.   reports that he has never smoked. His smokeless tobacco use includes Chew.  Past Surgical History:  Procedure Laterality Date  . APPENDECTOMY    . arm surgery     right  . KNEE SURGERY     right    Allergies  Allergen Reactions  . Penicillins Hives and Shortness Of Breath  . Hydrochlorothiazide Other (See Comments)    hypercalemia    Immunization History  Administered Date(s) Administered  . Influenza Split 04/22/2014, 02/28/2016  . Influenza,inj,Quad PF,36+ Mos 02/26/2015  . Pneumococcal Conjugate-13 04/22/2014    Family History  Problem Relation Age of Onset  . Heart disease Mother   . Diabetes Mother   . Hypertension Mother      Current Outpatient Prescriptions:  .  colchicine 0.6 MG tablet, Take 0.6 mg by mouth 2 (two) times daily., Disp: , Rfl:  .  dronedarone (MULTAQ)  400 MG tablet, Take 400 mg by mouth 2 (two) times daily with a meal., Disp: , Rfl:  .  famotidine (PEPCID) 20 MG tablet, Take 1 tablet (20 mg total) by mouth 2 (two) times daily., Disp: 60 tablet, Rfl: 1 .  Febuxostat (ULORIC) 80 MG TABS, Take 1 tablet by mouth daily., Disp: , Rfl:  .  fenofibrate (TRICOR) 145 MG tablet, Take 1 tablet (145 mg total) by mouth daily., Disp: 30 tablet, Rfl: 1 .  furosemide (LASIX) 40 MG tablet, Take 1 tablet (40 mg total) by mouth daily with breakfast., Disp: 30 tablet, Rfl: 1 .  HYDROcodone-acetaminophen (NORCO) 10-325 MG tablet, Take 1 tablet by mouth 4 (four) times daily., Disp: , Rfl:  .  metoprolol tartrate (LOPRESSOR) 25 MG tablet, Take 25 mg by mouth 2 (two) times daily., Disp: , Rfl:  .  omeprazole (PRILOSEC) 20 MG capsule, Take 1 capsule (20 mg total) by mouth 2 (two) times daily before a meal., Disp: 60 capsule, Rfl: 1 .  potassium chloride SA (K-DUR,KLOR-CON) 20 MEQ tablet, Take 1 tablet (20 mEq total) by mouth 2 (two) times daily., Disp: 60 tablet, Rfl: 1 .  predniSONE (DELTASONE) 10 MG tablet, Take 0.5 tablets (5 mg total) by mouth daily with breakfast., Disp: 30 tablet, Rfl: 2 .  pregabalin (LYRICA) 200 MG capsule, Take 1 capsule (200 mg total) by mouth 3 (three) times daily. Take 1 capsule three times a day., Disp: 90 capsule, Rfl: 0 .  rivaroxaban (XARELTO) 20 MG TABS tablet, Take 20 mg by mouth daily with supper., Disp: , Rfl:  .  torsemide (DEMADEX) 20 MG tablet, Take 20 mg by mouth daily., Disp: , Rfl:    Review of Systems     Objective:   Physical Exam  Constitutional: He is oriented to person, place, and time. He appears well-developed and well-nourished. No distress.  HENT:  Head: Normocephalic and atraumatic.  Right Ear: External ear normal.  Left Ear: External ear normal.  Mouth/Throat: Oropharynx is clear and moist. No oropharyngeal exudate.  o2 on  Eyes: Conjunctivae and EOM are normal. Pupils are equal, round, and reactive to light.  Right eye exhibits no discharge. Left eye exhibits no discharge. No scleral icterus.  Neck: Normal range of motion. Neck supple. No JVD present. No tracheal deviation present. No thyromegaly present.  Cardiovascular: Normal rate, regular rhythm and intact distal  pulses.  Exam reveals no gallop and no friction rub.   No murmur heard. Pulmonary/Chest: Effort normal and breath sounds normal. No respiratory distress. He has no wheezes. He has no rales. He exhibits no tenderness.  Abdominal: Soft. Bowel sounds are normal. He exhibits no distension and no mass. There is no tenderness. There is no rebound and no guarding.  Musculoskeletal: Normal range of motion. He exhibits edema. He exhibits no tenderness.  +++ edema  Lymphadenopathy:    He has no cervical adenopathy.  Neurological: He is alert and oriented to person, place, and time. He has normal reflexes. No cranial nerve deficit. Coordination normal.  Skin: Skin is warm and dry. No rash noted. He is not diaphoretic. No erythema. No pallor.  Psychiatric: He has a normal mood and affect. His behavior is normal. Judgment and thought content normal.  Nursing note and vitals reviewed.  Vitals:   04/04/16 1227  BP: 118/60  Pulse: 75  SpO2: 97%  Weight: (!) 353 lb (160.1 kg)  Height: 6' (1.829 m)    Estimated body mass index is 47.88 kg/m as calculated from the following:   Height as of this encounter: 6' (1.829 m).   Weight as of this encounter: 353 lb (160.1 kg).     Assessment:       ICD-9-CM ICD-10-CM   1. ILD (interstitial lung disease) (HCC) 515 J84.9 CT Chest High Resolution  2. Chronic respiratory failure with hypoxia (HCC) 518.83 J96.11 CT Chest High Resolution   799.02    3. History of ARDS V12.69 Z87.09 CT Chest High Resolution       Plan:      Agree with lower dose of prednisone at 5mg  per day - continue this Continue oxygen as needed for pulse ox goal greater than 88%;   currently using 7 L with exertion Overall  clinically stable health REfer  Dr. Lyndee Hensenaniel Bensimohn or Dr Marca Anconaalton McLean for right heart catheterization Do HRCT chest  In 1-2 months  Follow-up 2 months or sooner if needed after CT chest and visitng with Dr Mclean/Bensimohn   (> 50% of this 15 min visit spent in face to face counseling or/and coordination of care)  Dr. Kalman ShanMurali Mikai Meints, M.D., [facility]C.P Pulmonary and Critical Care Medicine Staff Physician [facility] Pager: [phone], If no answer or between  15:00h - 7:00h: call [phone]  04/04/2016 12:49 PM

## 2016-04-04 NOTE — Patient Instructions (Addendum)
ICD-9-CM ICD-10-CM   1. ILD (interstitial lung disease) (HCC) 515 J84.9   2. Chronic respiratory failure with hypoxia (HCC) 518.83 J96.11    799.02    3. History of ARDS V12.69 Z87.09    Agree with lower dose of prednisone at 5mg  per day - continue this Continue oxygen as needed for pulse ox goal greater than 88%;   currently using 7 L with exertion Overall clinically stable health REfer  Dr. Lyndee Hensen or Dr Marca Ancona for right heart catheterization Do HRCT chest  In 1-2 months  Follow-up 2 months or sooner if needed after CT chest and visitng with Dr Hale Drone

## 2016-04-20 ENCOUNTER — Telehealth (HOSPITAL_COMMUNITY): Payer: Self-pay | Admitting: *Deleted

## 2016-04-20 ENCOUNTER — Other Ambulatory Visit (HOSPITAL_COMMUNITY): Payer: Self-pay | Admitting: *Deleted

## 2016-04-20 DIAGNOSIS — R0600 Dyspnea, unspecified: Secondary | ICD-10-CM

## 2016-04-20 NOTE — Telephone Encounter (Signed)
Per referral from Dr [name] [facility] for 11/30 w/Dr Shirlee Latch.  Discussed pt's Xarelto with Dr Shirlee Latch, he recommends pt hold day before and day of procedure.  Pt aware and all instructions reviewed w/him via phone.

## 2016-04-29 ENCOUNTER — Encounter (HOSPITAL_COMMUNITY)
Admission: RE | Disposition: A | Discharge status: Home / Self Care | Payer: Self-pay | Source: Ambulatory Visit | Attending: Cardiology

## 2016-04-29 ENCOUNTER — Ambulatory Visit (HOSPITAL_COMMUNITY)
Admission: RE | Admit: 2016-04-29 | Discharge: 2016-04-29 | Disposition: A | Discharge status: Home / Self Care | Payer: Medicaid Other | Source: Ambulatory Visit | Attending: Cardiology | Admitting: Cardiology

## 2016-04-29 ENCOUNTER — Encounter (HOSPITAL_COMMUNITY): Payer: Self-pay | Admitting: *Deleted

## 2016-04-29 DIAGNOSIS — Z7901 Long term (current) use of anticoagulants: Secondary | ICD-10-CM | POA: Diagnosis not present

## 2016-04-29 DIAGNOSIS — Z833 Family history of diabetes mellitus: Secondary | ICD-10-CM | POA: Insufficient documentation

## 2016-04-29 DIAGNOSIS — Z9981 Dependence on supplemental oxygen: Secondary | ICD-10-CM | POA: Diagnosis not present

## 2016-04-29 DIAGNOSIS — I10 Essential (primary) hypertension: Secondary | ICD-10-CM | POA: Diagnosis not present

## 2016-04-29 DIAGNOSIS — Z880 Allergy status to penicillin: Secondary | ICD-10-CM | POA: Diagnosis not present

## 2016-04-29 DIAGNOSIS — R0600 Dyspnea, unspecified: Secondary | ICD-10-CM

## 2016-04-29 DIAGNOSIS — Z720 Tobacco use: Secondary | ICD-10-CM | POA: Diagnosis not present

## 2016-04-29 DIAGNOSIS — J849 Interstitial pulmonary disease, unspecified: Secondary | ICD-10-CM | POA: Diagnosis not present

## 2016-04-29 DIAGNOSIS — Z79899 Other long term (current) drug therapy: Secondary | ICD-10-CM | POA: Insufficient documentation

## 2016-04-29 DIAGNOSIS — I482 Chronic atrial fibrillation: Secondary | ICD-10-CM | POA: Insufficient documentation

## 2016-04-29 DIAGNOSIS — Z6841 Body Mass Index (BMI) 40.0 and over, adult: Secondary | ICD-10-CM | POA: Insufficient documentation

## 2016-04-29 DIAGNOSIS — J9611 Chronic respiratory failure with hypoxia: Secondary | ICD-10-CM | POA: Diagnosis not present

## 2016-04-29 DIAGNOSIS — Z8249 Family history of ischemic heart disease and other diseases of the circulatory system: Secondary | ICD-10-CM | POA: Insufficient documentation

## 2016-04-29 DIAGNOSIS — G4700 Insomnia, unspecified: Secondary | ICD-10-CM | POA: Insufficient documentation

## 2016-04-29 DIAGNOSIS — K219 Gastro-esophageal reflux disease without esophagitis: Secondary | ICD-10-CM | POA: Diagnosis not present

## 2016-04-29 DIAGNOSIS — I272 Pulmonary hypertension, unspecified: Secondary | ICD-10-CM | POA: Insufficient documentation

## 2016-04-29 DIAGNOSIS — E669 Obesity, unspecified: Secondary | ICD-10-CM | POA: Diagnosis not present

## 2016-04-29 DIAGNOSIS — Z8709 Personal history of other diseases of the respiratory system: Secondary | ICD-10-CM | POA: Insufficient documentation

## 2016-04-29 DIAGNOSIS — E785 Hyperlipidemia, unspecified: Secondary | ICD-10-CM | POA: Diagnosis not present

## 2016-04-29 HISTORY — PX: CARDIAC CATHETERIZATION: SHX172

## 2016-04-29 LAB — BASIC METABOLIC PANEL
Anion gap: 10 (ref 5–15)
BUN: 22 mg/dL — ABNORMAL HIGH (ref 6–20)
CALCIUM: 8.9 mg/dL (ref 8.9–10.3)
CHLORIDE: 97 mmol/L — AB (ref 101–111)
CO2: 30 mmol/L (ref 22–32)
CREATININE: 1.98 mg/dL — AB (ref 0.61–1.24)
GFR, EST AFRICAN AMERICAN: 43 mL/min — AB (ref >60)
GFR, EST NON AFRICAN AMERICAN: 37 mL/min — AB (ref >60)
Glucose, Bld: 99 mg/dL (ref 65–99)
Potassium: 4.6 mmol/L (ref 3.5–5.1)
SODIUM: 137 mmol/L (ref 135–145)

## 2016-04-29 LAB — POCT I-STAT 3, VENOUS BLOOD GAS (G3P V)
Acid-Base Excess: 7 mmol/L — ABNORMAL HIGH (ref 0.0–2.0)
Acid-Base Excess: 7 mmol/L — ABNORMAL HIGH (ref 0.0–2.0)
Bicarbonate: 34.8 mmol/L — ABNORMAL HIGH (ref 20.0–28.0)
Bicarbonate: 35.2 mmol/L — ABNORMAL HIGH (ref 20.0–28.0)
O2 Saturation: 69 %
O2 Saturation: 70 %
PCO2 VEN: 64.4 mmHg — AB (ref 44.0–60.0)
PH VEN: 7.346 (ref 7.250–7.430)
PH VEN: 7.347 (ref 7.250–7.430)
PO2 VEN: 40 mmHg (ref 32.0–45.0)
TCO2: 37 mmol/L (ref 0–100)
TCO2: 37 mmol/L (ref 0–100)
pCO2, Ven: 63.6 mmHg — ABNORMAL HIGH (ref 44.0–60.0)
pO2, Ven: 40 mmHg (ref 32.0–45.0)

## 2016-04-29 LAB — CBC
HCT: 39.1 % (ref 39.0–52.0)
Hemoglobin: 11.5 g/dL — ABNORMAL LOW (ref 13.0–17.0)
MCH: 20.6 pg — ABNORMAL LOW (ref 26.0–34.0)
MCHC: 29.4 g/dL — ABNORMAL LOW (ref 30.0–36.0)
MCV: 70.1 fL — ABNORMAL LOW (ref 78.0–100.0)
PLATELETS: 245 10*3/uL (ref 150–400)
RBC: 5.58 MIL/uL (ref 4.22–5.81)
RDW: 20.6 % — ABNORMAL HIGH (ref 11.5–15.5)
WBC: 9.2 10*3/uL (ref 4.0–10.5)

## 2016-04-29 LAB — PROTIME-INR
INR: 2.22
PROTHROMBIN TIME: 25 s — AB (ref 11.4–15.2)

## 2016-04-29 SURGERY — RIGHT HEART CATH
Anesthesia: LOCAL

## 2016-04-29 MED ORDER — SODIUM CHLORIDE 0.9% FLUSH: 3.0000 mL | INTRAVENOUS | Status: DC | PRN

## 2016-04-29 MED ORDER — SODIUM CHLORIDE 0.9% FLUSH: 3.0000 mL | Freq: Two times a day (BID) | INTRAVENOUS | Status: DC

## 2016-04-29 MED ORDER — LIDOCAINE HCL (PF) 1 % IJ SOLN
INTRAMUSCULAR | Status: DC | PRN
  Administered 2016-04-29: 2 mL

## 2016-04-29 MED ORDER — ONDANSETRON HCL 4 MG/2ML IJ SOLN: 4.0000 mg | Freq: Four times a day (QID) | INTRAMUSCULAR | Status: DC | PRN

## 2016-04-29 MED ORDER — SODIUM CHLORIDE 0.9 % IV SOLN: 250.0000 mL | INTRAVENOUS | Status: DC | PRN

## 2016-04-29 MED ORDER — HEPARIN (PORCINE) IN NACL 2-0.9 UNIT/ML-% IJ SOLN
INTRAMUSCULAR | Status: AC
Start: 2016-04-29 — End: 2016-04-29
  Filled 2016-04-29: qty 500

## 2016-04-29 MED ORDER — FENTANYL CITRATE (PF) 100 MCG/2ML IJ SOLN
INTRAMUSCULAR | Status: DC | PRN
  Administered 2016-04-29: 25 ug via INTRAVENOUS

## 2016-04-29 MED ORDER — MIDAZOLAM HCL 2 MG/2ML IJ SOLN
INTRAMUSCULAR | Status: AC
  Filled 2016-04-29: qty 2

## 2016-04-29 MED ORDER — ASPIRIN 81 MG PO CHEW
CHEWABLE_TABLET | ORAL | Status: AC
  Administered 2016-04-29: 81 mg via ORAL
  Filled 2016-04-29: qty 1

## 2016-04-29 MED ORDER — ACETAMINOPHEN 325 MG PO TABS: 650.0000 mg | ORAL_TABLET | ORAL | Status: DC | PRN

## 2016-04-29 MED ORDER — LIDOCAINE HCL (PF) 1 % IJ SOLN
INTRAMUSCULAR | Status: AC
  Filled 2016-04-29: qty 30

## 2016-04-29 MED ORDER — MIDAZOLAM HCL 2 MG/2ML IJ SOLN
INTRAMUSCULAR | Status: DC | PRN
  Administered 2016-04-29: 0.5 mg via INTRAVENOUS

## 2016-04-29 MED ORDER — HEPARIN (PORCINE) IN NACL 2-0.9 UNIT/ML-% IJ SOLN
INTRAMUSCULAR | Status: DC | PRN
Start: 2016-04-29 — End: 2016-04-29
  Administered 2016-04-29: 500 mL

## 2016-04-29 MED ORDER — SODIUM CHLORIDE 0.9 % IV SOLN
INTRAVENOUS | Status: DC
  Administered 2016-04-29: 14:00:00 via INTRAVENOUS

## 2016-04-29 MED ORDER — FENTANYL CITRATE (PF) 100 MCG/2ML IJ SOLN
INTRAMUSCULAR | Status: AC
  Filled 2016-04-29: qty 2

## 2016-04-29 MED ORDER — ASPIRIN 81 MG PO CHEW
81.0000 mg | CHEWABLE_TABLET | ORAL | Status: AC
  Administered 2016-04-29: 81 mg via ORAL

## 2016-04-29 SURGICAL SUPPLY — 10 items
CATH BALLN WEDGE 5F 110CM (CATHETERS) ×2 IMPLANT
GUIDEWIRE .025 260CM (WIRE) ×2 IMPLANT
KIT HEART RIGHT NAMIC (KITS) ×2 IMPLANT
PACK CARDIAC CATHETERIZATION (CUSTOM PROCEDURE TRAY) ×2 IMPLANT
PROTECTION STATION PRESSURIZED (MISCELLANEOUS) ×2
SHEATH FAST CATH BRACH 5F 5CM (SHEATH) ×2 IMPLANT
STATION PROTECTION PRESSURIZED (MISCELLANEOUS) ×1 IMPLANT
TRANSDUCER W/STOPCOCK (MISCELLANEOUS) ×2 IMPLANT
TUBING ART PRESS 72  MALE/FEM (TUBING) ×1
TUBING ART PRESS 72 MALE/FEM (TUBING) ×1 IMPLANT

## 2016-04-29 NOTE — H&P (View-Only) (Signed)
Subjective:     Patient ID: Adam Benson, male   DOB: June 02, 1960, 55 y.o.   MRN: [id number]  HPI  55 yo male former patient of DR. 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 consult after transfer from [facility] with Acute resp Failure with PNA /ARDS requiring intubation 02/01/15 . He had prolonged critical illness requiring tracheostomy. Suffered from Atrial Fib with RVR, shock, and c diff.   He had similar incidence in early 2016 with trach /decannulation .    04/13/2015 Post Hospital follow up  Pt returns for 1 month follow up .  Recently admitted for critical illness. He was admitted earlier this year for severe PNA w/ ARDS requiring prolonged vent support . He did have trach and was later decanulated. Tx in [place].  Presented acutely ill to [facility] in September , with PNA/ARDS requiring intubation on 02/01/15.   He had prolonged critical illness requiring tracheostomy. Suffered from Atrial Fib with RVR, shock, and c diff. He required discharged to IP rehab. Discharged home on 03/19/15.  He was decanulated on 03/16/15 . [name] site is healing well. No redness or fever.  He is Feeling much better.  Walking is doing much better, not having to use walker Remains on Oxygen 2l rest/At bedtime   and 5l/m act (pulse) .  Has been on oxygen since earlier admission in February this year.  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 On disability.  Says he is so much better. Less DOE but gets worn out easily and winded with prolonged walking.  Denies any chest tightness/congestion, sinus pressure, cough, fever, nausea or vomting.   says he had a sleep study  in [place] , dx with OSA . Suppose to see sleep doc but got admitted.  We requested copy of sleep study.   OV 05/15/2015  Chief Complaint  Patient presents with  . Follow-up    Former PW pt. Pt states his breathing  is unchanged. Pt c/o mild cough with little mucus production with yellow mucus. Pt denies CP/tightness and f/c/s. Pt states he is now using CPAP nightly.     Follow-up post-ARDS related interstitial lung disease. Had ARDSJa 2016 through February 2016 admitted again September 2016 for 3 months with pneumonia and ARDS idiopathic. Autoimmune panel was negative.  No history of HIV check.he was on oxygen 2 L even after the first episode of ARDS.  Today's routine follow-up. Overall he continues to do well. He uses 2 L of oxygen. He says he desaturates without it. He does not have much hoarseness of voice. He says his physical deconditioning is slowly improving. However he is very frustrated by the fact his had recurrent ARDS. He is very upset that his ARDS record. He does not understand why. His autoimmune panel was negative. I do not see evidence of HIV check but he denies that he could have an HIV. He denies drug abuse. He denies acid reflux. His swallowing is okay. His respiratory virus panel was negative in the fall 2016.  Most recent blood work creatinine of 0.84 mg percent October 2016 and a hemoglobin of 10 g percent October 2016.  Walking desaturation test 185 feet 3 laps on room air: 1 lap 83%, HR 182.     OV 08/19/2015  Chief Complaint  Patient presents with  . Follow-up    Pt states when he ran  out of prednisone his breathing worsened. Pt states his breathing is at baseline. Pt c/o non prod cough with chest congestion and chest tightness when active and SOB - resolves with rest.     Follow-up post-ARDS related interstitial lung disease and is obese man with sleep apnea on CPAP  Last seen December 2016. This a 3 month follow-up. Since his last visit in December 2016 he did have CT scan of the chest that showed significant improvement in his pulmonary infiltrates. However he does not feel this improvement. He says that without oxygen he desaturates into the 70s. He has class III dyspnea on  exertion. Oxygen helps and rest helps. He follows up with a local doctor for the sleep apnea. He seemed also locally by a cardiologist who changed his cardiac medications to multaq. This has not helped his dyspnea. In the interim he did run out of daily prednisone and he became more short of breath but he does not know he desaturated when he ran out of prednisone.  He is extremely worried about the etiology and uncertainty with his disease. He is worried about the risk of recurrent ARDS. He also feels that he should not be on daily prednisone. He is open to the idea for second opinion.   OV 12/17/2015  Chief Complaint  Patient presents with  . Follow-up    [facility] has him on 9 liters O2. reports breathing is getting worse, wheezing, chest tightness, prod cough (cream color phlem).    Follow-up post-ARDS related interstitial lung disease. Obese man with sleep apnea on CPAP  Last visit in 08/19/2015. Chest x-ray at that time showed persistence of infiltrates. I referred him to [facility]. He saw Dr. Glynda Jaeger 11/30/2015. I personally reviewed the note. Dr. Glynda Jaeger is cutting down the prednisone 5 mg daily. There is no change in his dyspnea baseline cough. The main issues that he continues to have rapidly fluctuating weight issues. He is on diuresis. He has seen his cardiologist Dr. Sampson Goon at [facility]. Dr. Glynda Jaeger at [facility] is recommending right heart catheterization the patient seems to forgotten about this. According to the patient is no active appointment pending for right heart catheterization but he is open to having this procedure. He understands that weight loss is the key to long-term health    OV 04/04/2016  Chief Complaint  Patient presents with  . Follow-up    Breathing is not doing good today. Breathing is only good sitting down for a long period of time. Winded using the bathroom. SOB w/ excertion causing tightness and congestion. Cough/ w greyish  brown mucus.      Follow-up post-ARDS related interstitial lung disease. Obese man with sleep apnea on CPAP   Last vist July 2017. This visit was supposed to be post right heart cath that ws recommeneded at [facility] given his significant daily weight changes. [facility] and DR Fitzgeral preferred be done locally. Chart review indicates my office could not get hold of him to set up referral to Dr McLean/Bensimohn. He says he never got a call from  [place]. In any event, overall same. USes 7L o2. Still with daily weight changes. Says Dr Randel Pigg his cardiologist is going to try cardioverting his Atrial Fibrillation for 4th time. He states he is pessmistic about its success . No other acute or chronic symptoms. On lower daily prednisone 5mg  per day; seeoms to be keeping lung stable. Last CT chest was  1 year ago    has  a past medical history of Chronic atrial fibrillation (HCC); GERD (gastroesophageal reflux disease); Gout; History of pneumonia (05/2014 ); History of tracheostomy (06/2014>>out 07/2014); HTN (hypertension), benign; Hyperlipemia; Insomnia; Nerve damage; Obesity; Peptic ulcer (04/2014); and Pneumonia.   reports that he has never smoked. His smokeless tobacco use includes Chew.  Past Surgical History:  Procedure Laterality Date  . APPENDECTOMY    . arm surgery     right  . KNEE SURGERY     right    Allergies  Allergen Reactions  . Penicillins Hives and Shortness Of Breath  . Hydrochlorothiazide Other (See Comments)    hypercalemia    Immunization History  Administered Date(s) Administered  . Influenza Split 04/22/2014, 02/28/2016  . Influenza,inj,Quad PF,36+ Mos 02/26/2015  . Pneumococcal Conjugate-13 04/22/2014    Family History  Problem Relation Age of Onset  . Heart disease Mother   . Diabetes Mother   . Hypertension Mother      Current Outpatient Prescriptions:  .  colchicine 0.6 MG tablet, Take 0.6 mg by mouth 2 (two) times daily., Disp: , Rfl:  .  dronedarone (MULTAQ)  400 MG tablet, Take 400 mg by mouth 2 (two) times daily with a meal., Disp: , Rfl:  .  famotidine (PEPCID) 20 MG tablet, Take 1 tablet (20 mg total) by mouth 2 (two) times daily., Disp: 60 tablet, Rfl: 1 .  Febuxostat (ULORIC) 80 MG TABS, Take 1 tablet by mouth daily., Disp: , Rfl:  .  fenofibrate (TRICOR) 145 MG tablet, Take 1 tablet (145 mg total) by mouth daily., Disp: 30 tablet, Rfl: 1 .  furosemide (LASIX) 40 MG tablet, Take 1 tablet (40 mg total) by mouth daily with breakfast., Disp: 30 tablet, Rfl: 1 .  HYDROcodone-acetaminophen (NORCO) 10-325 MG tablet, Take 1 tablet by mouth 4 (four) times daily., Disp: , Rfl:  .  metoprolol tartrate (LOPRESSOR) 25 MG tablet, Take 25 mg by mouth 2 (two) times daily., Disp: , Rfl:  .  omeprazole (PRILOSEC) 20 MG capsule, Take 1 capsule (20 mg total) by mouth 2 (two) times daily before a meal., Disp: 60 capsule, Rfl: 1 .  potassium chloride SA (K-DUR,KLOR-CON) 20 MEQ tablet, Take 1 tablet (20 mEq total) by mouth 2 (two) times daily., Disp: 60 tablet, Rfl: 1 .  predniSONE (DELTASONE) 10 MG tablet, Take 0.5 tablets (5 mg total) by mouth daily with breakfast., Disp: 30 tablet, Rfl: 2 .  pregabalin (LYRICA) 200 MG capsule, Take 1 capsule (200 mg total) by mouth 3 (three) times daily. Take 1 capsule three times a day., Disp: 90 capsule, Rfl: 0 .  rivaroxaban (XARELTO) 20 MG TABS tablet, Take 20 mg by mouth daily with supper., Disp: , Rfl:  .  torsemide (DEMADEX) 20 MG tablet, Take 20 mg by mouth daily., Disp: , Rfl:    Review of Systems     Objective:   Physical Exam  Constitutional: He is oriented to person, place, and time. He appears well-developed and well-nourished. No distress.  HENT:  Head: Normocephalic and atraumatic.  Right Ear: External ear normal.  Left Ear: External ear normal.  Mouth/Throat: Oropharynx is clear and moist. No oropharyngeal exudate.  o2 on  Eyes: Conjunctivae and EOM are normal. Pupils are equal, round, and reactive to light.  Right eye exhibits no discharge. Left eye exhibits no discharge. No scleral icterus.  Neck: Normal range of motion. Neck supple. No JVD present. No tracheal deviation present. No thyromegaly present.  Cardiovascular: Normal rate, regular rhythm and intact distal  pulses.  Exam reveals no gallop and no friction rub.   No murmur heard. Pulmonary/Chest: Effort normal and breath sounds normal. No respiratory distress. He has no wheezes. He has no rales. He exhibits no tenderness.  Abdominal: Soft. Bowel sounds are normal. He exhibits no distension and no mass. There is no tenderness. There is no rebound and no guarding.  Musculoskeletal: Normal range of motion. He exhibits edema. He exhibits no tenderness.  +++ edema  Lymphadenopathy:    He has no cervical adenopathy.  Neurological: He is alert and oriented to person, place, and time. He has normal reflexes. No cranial nerve deficit. Coordination normal.  Skin: Skin is warm and dry. No rash noted. He is not diaphoretic. No erythema. No pallor.  Psychiatric: He has a normal mood and affect. His behavior is normal. Judgment and thought content normal.  Nursing note and vitals reviewed.  Vitals:   04/04/16 1227  BP: 118/60  Pulse: 75  SpO2: 97%  Weight: (!) 353 lb (160.1 kg)  Height: 6' (1.829 m)    Estimated body mass index is 47.88 kg/m as calculated from the following:   Height as of this encounter: 6' (1.829 m).   Weight as of this encounter: 353 lb (160.1 kg).     Assessment:       ICD-9-CM ICD-10-CM   1. ILD (interstitial lung disease) (HCC) 515 J84.9 CT Chest High Resolution  2. Chronic respiratory failure with hypoxia (HCC) 518.83 J96.11 CT Chest High Resolution   799.02    3. History of ARDS V12.69 Z87.09 CT Chest High Resolution       Plan:      Agree with lower dose of prednisone at 5mg  per day - continue this Continue oxygen as needed for pulse ox goal greater than 88%;   currently using 7 L with exertion Overall  clinically stable health REfer  Dr. Lyndee Hensenaniel Bensimohn or Dr Marca Anconaalton McLean for right heart catheterization Do HRCT chest  In 1-2 months  Follow-up 2 months or sooner if needed after CT chest and visitng with Dr Mclean/Bensimohn   (> 50% of this 15 min visit spent in face to face counseling or/and coordination of care)  Dr. Kalman ShanMurali Ahmaad Neidhardt, M.D., [facility]C.P Pulmonary and Critical Care Medicine Staff Physician [facility] [place] [facility] Pager: [phone], If no answer or between  15:00h - 7:00h: call [phone]  04/04/2016 12:49 PM

## 2016-04-29 NOTE — Interval H&P Note (Signed)
History and Physical Interval Note:  04/29/2016 2:53 PM  Adam Benson  has presented today for surgery, with the diagnosis of sob, rule out pulmonary hypertension  The various methods of treatment have been discussed with the patient and family. After consideration of risks, benefits and other options for treatment, the patient has consented to  Procedure(s): Right Heart Cath (N/A) as a surgical intervention .  [name] history has been reviewed, patient examined, no change in status, stable for surgery.  I have reviewed the patient's chart and labs.  Questions were answered to the patient's satisfaction.     [name] [employer]

## 2016-04-29 NOTE — Discharge Instructions (Signed)
CALL DR MCLEAN'S OFFICE IF ANY PROBLEMS,QUESTIONS, OR CONCERNS; CALL IF ANY BLEEDING,REDNESS,DRAINAGE,FEVER,PAIN, OR SWELLING RIGHT ARM SITE

## 2016-05-02 ENCOUNTER — Encounter (HOSPITAL_COMMUNITY): Payer: Self-pay | Admitting: Cardiology

## 2016-06-06 ENCOUNTER — Inpatient Hospital Stay: Admission: RE | Admit: 2016-06-06 | Payer: Self-pay | Source: Ambulatory Visit

## 2016-06-06 ENCOUNTER — Telehealth: Payer: Self-pay | Admitting: Internal Medicine

## 2016-06-06 NOTE — Telephone Encounter (Signed)
Will forward to MR as FYI that the pt did not show for his ct chest

## 2016-06-14 ENCOUNTER — Ambulatory Visit: Payer: Self-pay | Admitting: Internal Medicine

## 2016-06-17 NOTE — Telephone Encounter (Signed)
Send certified letter  Dr. Kalman Shan, M.D., [facility].C.P Pulmonary and Critical Care Medicine Staff Physician [facility] [place] [facility] Pager: [phone], If no answer or between  15:00h - 7:00h: call [phone]  06/17/2016 1:09 PM

## 2016-07-04 ENCOUNTER — Ambulatory Visit (INDEPENDENT_AMBULATORY_CARE_PROVIDER_SITE_OTHER): Payer: Medicaid Other | Admitting: Internal Medicine

## 2016-07-04 ENCOUNTER — Encounter: Payer: Self-pay | Admitting: Internal Medicine

## 2016-07-04 VITALS — BP 118/72 | HR 95

## 2016-07-04 DIAGNOSIS — J9611 Chronic respiratory failure with hypoxia: Secondary | ICD-10-CM

## 2016-07-04 DIAGNOSIS — J849 Interstitial pulmonary disease, unspecified: Secondary | ICD-10-CM | POA: Diagnosis not present

## 2016-07-04 DIAGNOSIS — Z8709 Personal history of other diseases of the respiratory system: Secondary | ICD-10-CM

## 2016-07-04 DIAGNOSIS — I2729 Other secondary pulmonary hypertension: Secondary | ICD-10-CM

## 2016-07-04 NOTE — Progress Notes (Signed)
Subjective:     Patient ID: Adam Benson, male   DOB: 14-Oct-1960, 56 y.o.   MRN: [id number]  HPI    56 yo male former patient of DR. 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 He had prolonged critical illness requiring tracheostomy. Suffered from Atrial Fib with RVR, shock, and c diff.   He had similar incidence in early 2016 with trach /decannulation .    04/13/2015 Post Hospital follow up  Pt returns for 1 month follow up .  Recently admitted for critical illness. He was admitted earlier this year for severe PNA w/ ARDS requiring prolonged vent support . He did have trach and was later decanulated. Tx in [place].  Presented acutely ill to [facility] in September , with PNA/ARDS requiring intubation on 02/01/15.   He had prolonged critical illness requiring tracheostomy. Suffered from Atrial Fib with RVR, shock, and c diff. He required discharged to IP rehab. Discharged home on 03/19/15.  He was decanulated on 03/16/15 . [name] site is healing well. No redness or fever.  He is Feeling much better.  Walking is doing much better, not having to use walker Remains on Oxygen 2l rest/At bedtime   and 5l/m act (pulse) .  Has been on oxygen since earlier admission in February this year.  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 On disability.  Says he is so much better. Less DOE but gets worn out easily and winded with prolonged walking.  Denies any chest tightness/congestion, sinus pressure, cough, fever, nausea or vomting.   says he had a sleep study  in [place] , dx with OSA . Suppose to see sleep doc but got admitted.  We requested copy of sleep study.   OV 05/15/2015  Chief Complaint  Patient presents with  . Follow-up    Former PW pt. Pt states his  breathing is unchanged. Pt c/o mild cough with little mucus production with yellow mucus. Pt denies CP/tightness and f/c/s. Pt states he is now using CPAP nightly.     Follow-up post-ARDS related interstitial lung disease. Had ARDSJa 2016 through February 2016 admitted again September 2016 for 3 months with pneumonia and ARDS idiopathic. Autoimmune panel was negative.  No history of HIV check.he was on oxygen 2 L even after the first episode of ARDS.  Today's routine follow-up. Overall he continues to do well. He uses 2 L of oxygen. He says he desaturates without it. He does not have much hoarseness of voice. He says his physical deconditioning is slowly improving. However he is very frustrated by the fact his had recurrent ARDS. He is very upset that his ARDS record. He does not understand why. His autoimmune panel was negative. I do not see evidence of HIV check but he denies that he could have an HIV. He denies drug abuse. He denies acid reflux. His swallowing is okay. His respiratory virus panel was negative in the fall 2016.  Most recent blood work creatinine of 0.84 mg percent October 2016 and a hemoglobin of 10 g percent October 2016.  Walking desaturation test 185 feet 3 laps on room air: 1 lap 83%, HR 182.     OV 08/19/2015  Chief Complaint  Patient presents with  . Follow-up    Pt states when  he ran out of prednisone his breathing worsened. Pt states his breathing is at baseline. Pt c/o non prod cough with chest congestion and chest tightness when active and SOB - resolves with rest.     Follow-up post-ARDS related interstitial lung disease and is obese man with sleep apnea on CPAP  Last seen December 2016. This a 3 month follow-up. Since his last visit in December 2016 he did have CT scan of the chest that showed significant improvement in his pulmonary infiltrates. However he does not feel this improvement. He says that without oxygen he desaturates into the 70s. He has class III  dyspnea on exertion. Oxygen helps and rest helps. He follows up with a local doctor for the sleep apnea. He seemed also locally by a cardiologist who changed his cardiac medications to multaq. This has not helped his dyspnea. In the interim he did run out of daily prednisone and he became more short of breath but he does not know he desaturated when he ran out of prednisone.  He is extremely worried about the etiology and uncertainty with his disease. He is worried about the risk of recurrent ARDS. He also feels that he should not be on daily prednisone. He is open to the idea for second opinion.   OV 12/17/2015  Chief Complaint  Patient presents with  . Follow-up    [facility] has him on 9 liters O2. reports breathing is getting worse, wheezing, chest tightness, prod cough (cream color phlem).    Follow-up post-ARDS related interstitial lung disease. Obese man with sleep apnea on CPAP  Last visit in 08/19/2015. Chest x-ray at that time showed persistence of infiltrates. I referred him to [facility]. He saw Dr. Glynda Jaeger 11/30/2015. I personally reviewed the note. Dr. Glynda Jaeger is cutting down the prednisone 5 mg daily. There is no change in his dyspnea baseline cough. The main issues that he continues to have rapidly fluctuating weight issues. He is on diuresis. He has seen his cardiologist Dr. Sampson Goon at [facility]. Dr. Glynda Jaeger at [facility] is recommending right heart catheterization the patient seems to forgotten about this. According to the patient is no active appointment pending for right heart catheterization but he is open to having this procedure. He understands that weight loss is the key to long-term health    OV 04/04/2016  Chief Complaint  Patient presents with  . Follow-up    Breathing is not doing good today. Breathing is only good sitting down for a long period of time. Winded using the bathroom. SOB w/ excertion causing tightness and congestion. Cough/  w greyish brown mucus.      Follow-up post-ARDS related interstitial lung disease. Obese man with sleep apnea on CPAP   Last vist July 2017. This visit was supposed to be post right heart cath that ws recommeneded at [facility] given his significant daily weight changes. [facility] and DR Fitzgeral preferred be done locally. Chart review indicates my office could not get hold of him to set up referral to Dr McLean/Bensimohn. He says he never got a call from  [place]. In any event, overall same. USes 7L o2. Still with daily weight changes. Says Dr Randel Pigg his cardiologist is going to try cardioverting his Atrial Fibrillation for 4th time. He states he is pessmistic about its success . No other acute or chronic symptoms. On lower daily prednisone 5mg  per day; seeoms to be keeping lung stable. Last CT chest was  1 year ago  OV 07/04/2016  Chief Complaint  Patient presents with  . Follow-up    ILD, still remains on 7 liters with exertion, breathing is only good when is sitting down, exertion really causes him to get low in O2, he still coughs at night which effects his sleep at night    Follow-up post-ARDS related interstitial lung disease. Obese man with sleep apnea on CPAP  Follow-up overall stable. He is up-to-date with his flu shot. Uses 7 L of oxygen. He recently had his right heart catheterization December 2017 that shows pulmonary venous hypertension with elevated wedge pressures. He says that his cardiologist in [place] has tried different diuretics but is always resistant. He is very pessimistic about him getting better. He is open to getting a second opinion from a cardiologist here in [place] with diuresis management. In terms of his atrial fibrillation he is now in sinus rhythm after cardioversion  RHC - copied and pasted as below  Right Heart Pressures RHC Procedural Findings: Hemodynamics (mmHg) RA mean 12 RV 42/18 PA 44/24, mean 33 PCWP mean 22  Oxygen saturations: PA 69% AO  97%  Cardiac Output (Fick) 8.5  Cardiac Index (Fick) 3.04 PVR 1.3 WU  Conclusion   1. Elevated left and right filling pressure.  2. Pulmonary venous hypertension.       has a past medical history of Chronic atrial fibrillation (HCC); GERD (gastroesophageal reflux disease); Gout; History of pneumonia (05/2014 ); History of tracheostomy (06/2014>>out 07/2014); HTN (hypertension), benign; Hyperlipemia; Insomnia; Nerve damage; Obesity; Peptic ulcer (04/2014); and Pneumonia.   reports that he has never smoked. His smokeless tobacco use includes Chew.  Past Surgical History:  Procedure Laterality Date  . APPENDECTOMY    . arm surgery     right  . CARDIAC CATHETERIZATION N/A 04/29/2016   Procedure: Right Heart Cath;  Surgeon: Laurey Morale, MD;  Location[facility] INVASIVE CV LAB;  Service: Cardiovascular;  Laterality: N/A;  . KNEE SURGERY     right    Allergies  Allergen Reactions  . Penicillins Hives and Shortness Of Breath    Has patient had a PCN reaction causing immediate rash, facial/tongue/throat swelling, SOB or lightheadedness with hypotension: UNKNOWN Has patient had a PCN reaction causing severe rash involving mucus membranes or skin necrosis: No Has patient had a PCN reaction that required hospitalization No Has patient had a PCN reaction occurring within the last 10 years: No If all of the above answers are "NO", then may proceed with Cephalosporin use.   Marland Kitchen Hydrochlorothiazide Other (See Comments)    hypercalemia    Immunization History  Administered Date(s) Administered  . Influenza Split 04/22/2014, 02/28/2016  . Influenza,inj,Quad PF,36+ Mos 02/26/2015  . Pneumococcal Conjugate-13 04/22/2014    Family History  Problem Relation Age of Onset  . Heart disease Mother   . Diabetes Mother   . Hypertension Mother      Current Outpatient Prescriptions:  .  colchicine 0.6 MG tablet, Take 0.6 mg by mouth daily. , Disp: , Rfl:  .  dronedarone (MULTAQ) 400 MG tablet,  Take 400 mg by mouth 2 (two) times daily with a meal., Disp: , Rfl:  .  famotidine (PEPCID) 20 MG tablet, Take 1 tablet (20 mg total) by mouth 2 (two) times daily., Disp: 60 tablet, Rfl: 1 .  Febuxostat (ULORIC) 80 MG TABS, Take 1 tablet by mouth daily., Disp: , Rfl:  .  fenofibrate (TRICOR) 145 MG tablet, Take 1 tablet (145 mg total) by mouth daily., Disp: 30  tablet, Rfl: 1 .  furosemide (LASIX) 40 MG tablet, Take 1 tablet (40 mg total) by mouth daily with breakfast., Disp: 30 tablet, Rfl: 1 .  HYDROcodone-acetaminophen (NORCO) 10-325 MG tablet, Take 1 tablet by mouth 4 (four) times daily., Disp: , Rfl:  .  metoprolol tartrate (LOPRESSOR) 25 MG tablet, Take 25 mg by mouth 2 (two) times daily., Disp: , Rfl:  .  omeprazole (PRILOSEC) 20 MG capsule, Take 1 capsule (20 mg total) by mouth 2 (two) times daily before a meal., Disp: 60 capsule, Rfl: 1 .  potassium chloride SA (K-DUR,KLOR-CON) 20 MEQ tablet, Take 1 tablet (20 mEq total) by mouth 2 (two) times daily. (Patient taking differently: Take 20 mEq by mouth daily. ), Disp: 60 tablet, Rfl: 1 .  predniSONE (DELTASONE) 10 MG tablet, Take 0.5 tablets (5 mg total) by mouth daily with breakfast., Disp: 30 tablet, Rfl: 2 .  pregabalin (LYRICA) 200 MG capsule, Take 1 capsule (200 mg total) by mouth 3 (three) times daily. Take 1 capsule three times a day. (Patient taking differently: Take 200 mg by mouth 3 (three) times daily. ), Disp: 90 capsule, Rfl: 0 .  rivaroxaban (XARELTO) 20 MG TABS tablet, Take 20 mg by mouth daily with supper., Disp: , Rfl:  .  spironolactone (ALDACTONE) 25 MG tablet, Take 50 mg by mouth daily., Disp: , Rfl:  .  testosterone cypionate (DEPOTESTOSTERONE CYPIONATE) 200 MG/ML injection, Inject 200 mg into the muscle every 7 (seven) days. Wednesdays, Disp: , Rfl:  .  torsemide (DEMADEX) 20 MG tablet, Take 20 mg by mouth daily., Disp: , Rfl:  .  ranolazine (RANEXA) 1000 MG SR tablet, Take 1,000 mg by mouth 2 (two) times daily., Disp: ,  Rfl:     Review of Systems     Objective:   Physical Exam  Constitutional: He is oriented to person, place, and time. He appears well-developed and well-nourished. No distress.  HENT:  Head: Normocephalic and atraumatic.  Right Ear: External ear normal.  Left Ear: External ear normal.  Mouth/Throat: Oropharynx is clear and moist. No oropharyngeal exudate.  Obese mallampatti class 3-4 o2 on  Eyes: Conjunctivae and EOM are normal. Pupils are equal, round, and reactive to light. Right eye exhibits no discharge. Left eye exhibits no discharge. No scleral icterus.  Neck: Normal range of motion. Neck supple. No JVD present. No tracheal deviation present. No thyromegaly present.  Cardiovascular: Normal rate, regular rhythm and intact distal pulses.  Exam reveals no gallop and no friction rub.   No murmur heard. Pulmonary/Chest: Effort normal. No respiratory distress. He has no wheezes. He has rales. He exhibits no tenderness.  Abdominal: Soft. Bowel sounds are normal. He exhibits no distension and no mass. There is no tenderness. There is no rebound and no guarding.  Musculoskeletal: Normal range of motion. He exhibits no edema or tenderness.  Lymphadenopathy:    He has no cervical adenopathy.  Neurological: He is alert and oriented to person, place, and time. He has normal reflexes. No cranial nerve deficit. Coordination normal.  Skin: Skin is warm and dry. No rash noted. He is not diaphoretic. No erythema. No pallor.  Psychiatric: He has a normal mood and affect. His behavior is normal. Judgment and thought content normal.  Nursing note and vitals reviewed.   Vitals:   07/04/16 1049  BP: 118/72  Pulse: 95  SpO2: 96%    Estimated body mass index is 42.6 kg/m as calculated from the following:   Height as of 04/29/16:  6\' 4"  (1.93 m).   Weight as of 04/29/16: 350 lb (158.8 kg).      Assessment:       ICD-9-CM ICD-10-CM   1. ILD (interstitial lung disease) (HCC) 515 J84.9   2.  Chronic respiratory failure with hypoxia (HCC) 518.83 J96.11    799.02    3. History of ARDS V12.69 Z87.09   4. 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  Marca Ancona for 2nd opinion on your diuressis management Do HRCT chest  07/04/2016 if possibe in [place] any location or at time of followup in 3 months  Follow-up 3 months or sooner     Dr. Kalman Shan, M.D., [facility].C.P Pulmonary and Critical Care Medicine Staff Physician [facility] [place] [facility] Pager: [phone], If no answer or between  15:00h - 7:00h: call [phone]  07/04/2016 11:24 AM

## 2016-07-04 NOTE — Patient Instructions (Addendum)
ICD-9-CM ICD-10-CM   1. ILD (interstitial lung disease) (HCC) 515 J84.9   2. Chronic respiratory failure with hypoxia (HCC) 518.83 J96.11    799.02    3. History of ARDS V12.69 Z87.09   4. 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  Marca Ancona for 2nd opinion on your diuressis management Do HRCT chest  07/04/2016 if possibe in [place] any location or at time of followup in 3 months  Follow-up 3 months or sooner

## 2016-07-05 ENCOUNTER — Other Ambulatory Visit: Payer: Self-pay | Admitting: Emergency Medicine

## 2016-07-05 MED ORDER — PREDNISONE 10 MG PO TABS
5.0000 mg | ORAL_TABLET | Freq: Every day | ORAL | 2 refills | Status: DC
Start: 2016-07-05 — End: 2017-02-13

## 2016-07-12 ENCOUNTER — Ambulatory Visit (INDEPENDENT_AMBULATORY_CARE_PROVIDER_SITE_OTHER)
Admission: RE | Admit: 2016-07-12 | Discharge: 2016-07-12 | Disposition: A | Discharge status: Home / Self Care | Payer: Medicaid Other | Source: Ambulatory Visit | Attending: Internal Medicine | Admitting: Internal Medicine

## 2016-07-12 DIAGNOSIS — J849 Interstitial pulmonary disease, unspecified: Secondary | ICD-10-CM | POA: Diagnosis not present

## 2016-07-12 DIAGNOSIS — Z8709 Personal history of other diseases of the respiratory system: Secondary | ICD-10-CM

## 2016-07-12 DIAGNOSIS — J9611 Chronic respiratory failure with hypoxia: Secondary | ICD-10-CM | POA: Diagnosis not present

## 2016-07-15 NOTE — Progress Notes (Signed)
Called patient and receiving person stated he was not available. Will try again later.

## 2016-07-18 NOTE — Progress Notes (Signed)
Called patient and received automated message stating patient was unavailable right now. Will try again later.

## 2016-07-28 NOTE — Progress Notes (Signed)
Spoke with patient and informed him of results. Pt did not have any questions. Nothing further is needed.

## 2016-08-03 ENCOUNTER — Encounter (HOSPITAL_COMMUNITY): Payer: Self-pay | Admitting: *Deleted

## 2016-08-03 ENCOUNTER — Other Ambulatory Visit (HOSPITAL_COMMUNITY): Payer: Self-pay | Admitting: *Deleted

## 2016-08-03 ENCOUNTER — Ambulatory Visit (HOSPITAL_COMMUNITY)
Admission: RE | Admit: 2016-08-03 | Discharge: 2016-08-03 | Disposition: A | Discharge status: Home / Self Care | Payer: Medicaid Other | Source: Ambulatory Visit | Attending: Cardiology | Admitting: Cardiology

## 2016-08-03 ENCOUNTER — Encounter (HOSPITAL_COMMUNITY): Payer: Self-pay

## 2016-08-03 VITALS — BP 130/72 | HR 104 | Ht 72.0 in | Wt 358.0 lb

## 2016-08-03 DIAGNOSIS — Z79899 Other long term (current) drug therapy: Secondary | ICD-10-CM | POA: Insufficient documentation

## 2016-08-03 DIAGNOSIS — Z8249 Family history of ischemic heart disease and other diseases of the circulatory system: Secondary | ICD-10-CM | POA: Insufficient documentation

## 2016-08-03 DIAGNOSIS — I4892 Unspecified atrial flutter: Secondary | ICD-10-CM | POA: Diagnosis not present

## 2016-08-03 DIAGNOSIS — I4891 Unspecified atrial fibrillation: Secondary | ICD-10-CM | POA: Diagnosis not present

## 2016-08-03 DIAGNOSIS — J8410 Pulmonary fibrosis, unspecified: Secondary | ICD-10-CM

## 2016-08-03 DIAGNOSIS — J984 Other disorders of lung: Secondary | ICD-10-CM | POA: Diagnosis not present

## 2016-08-03 DIAGNOSIS — Z833 Family history of diabetes mellitus: Secondary | ICD-10-CM | POA: Insufficient documentation

## 2016-08-03 DIAGNOSIS — I5032 Chronic diastolic (congestive) heart failure: Secondary | ICD-10-CM

## 2016-08-03 DIAGNOSIS — N183 Chronic kidney disease, stage 3 (moderate): Secondary | ICD-10-CM | POA: Insufficient documentation

## 2016-08-03 DIAGNOSIS — I130 Hypertensive heart and chronic kidney disease with heart failure and stage 1 through stage 4 chronic kidney disease, or unspecified chronic kidney disease: Secondary | ICD-10-CM | POA: Insufficient documentation

## 2016-08-03 LAB — BASIC METABOLIC PANEL
Anion gap: 11 (ref 5–15)
BUN: 16 mg/dL (ref 6–20)
CALCIUM: 9.2 mg/dL (ref 8.9–10.3)
CO2: 32 mmol/L (ref 22–32)
CREATININE: 1.79 mg/dL — AB (ref 0.61–1.24)
Chloride: 96 mmol/L — ABNORMAL LOW (ref 101–111)
GFR calc Af Amer: 48 mL/min — ABNORMAL LOW (ref >60)
GFR, EST NON AFRICAN AMERICAN: 42 mL/min — AB (ref >60)
GLUCOSE: 92 mg/dL (ref 65–99)
Potassium: 4.2 mmol/L (ref 3.5–5.1)
SODIUM: 139 mmol/L (ref 135–145)

## 2016-08-03 LAB — CBC
HCT: 45 % (ref 39.0–52.0)
Hemoglobin: 13.4 g/dL (ref 13.0–17.0)
MCH: 22 pg — ABNORMAL LOW (ref 26.0–34.0)
MCHC: 29.8 g/dL — AB (ref 30.0–36.0)
MCV: 74 fL — ABNORMAL LOW (ref 78.0–100.0)
Platelets: 207 10*3/uL (ref 150–400)
RBC: 6.08 MIL/uL — ABNORMAL HIGH (ref 4.22–5.81)
RDW: 22.1 % — AB (ref 11.5–15.5)
WBC: 8.5 10*3/uL (ref 4.0–10.5)

## 2016-08-03 LAB — BRAIN NATRIURETIC PEPTIDE: B NATRIURETIC PEPTIDE 5: 52.8 pg/mL (ref 0.0–100.0)

## 2016-08-03 MED ORDER — TORSEMIDE 20 MG PO TABS: 40.0000 mg | ORAL_TABLET | Freq: Every day | ORAL | 3 refills | Status: DC

## 2016-08-03 NOTE — Progress Notes (Signed)
PCP: O[facility]Buch Pulmonary: Dr. [name] Cardiology: Dr. 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  He has been followed in [facility] in the past for cardiology. Dr. [name] has been following him for pulmonary fibrosis. He had ARDs twice in 2016, once 1/16-2/16.  The second time, he had PNA developing into ARDS and was in the hospital for 3 months.  High resolution CT done 2/18 showed pulmonary fibrosis.  He is on home oxygen during the day and CPAP (for OSA) at night.    He has also had atrial arrhythmias, both atrial fibrillation and atrial flutter.  He has been cardioverted several times.  He has been on ranolazine and Multaq, but recently, his insurance company has said that it will not cover Multaq any longer and he is off it.  He is in atrial flutter today.   He is short of breath after walking < 100 feet.  This is chronic.  He is short of breath walking up 1 flight steps.  He has occasional pleuritic chest pain when he is significantly short of breath.  I took him for RHC in 12/17.  This showed mildly elevated filling pressures and pulmonary venous hypertension.   ECG (personally reviewed): atrial flutter at 104, 2:1 block, QTc 494 msec.   Labs (12/17): K 4.6, creatinine 1.98, hgb 11.5  PMH; 1. PUD 2. GERD 3. OSA: Using CPAP.  4. ARDS: 1/16-2/16 initially, then admitted again in 9/16 for 3 months with PNA and ARDS.  He developed post-inflammatory pulmonary fibrosis. 5. 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  - High resolution CT chest (2/18): Pulmonary fibrosis noted, looks like NSIP versus chronic hypersensitivity pneumonitis.  - On home oxygen.  6. Ascending aortic aneurysm:  4.6 cm on CT in 2/18.  7. Atrial arrhythmias: Atrial fibrillation and atrial flutter  have both been noted.  He has had several cardioversions in the past.  Was followed at [facility], was on ranolazine + Multaq, but insurance will not cover Multaq.  8. Cardiomyopathy with chronic primarily diastolic CHF:  - Echo in [place] in the past with EF in 45% range.  - Echo (9/16) with EF 50-55%, moderate biatrial enlargement.  - RHC (12/17): mean 12, PA 44/24 mean 33, mean PCWP 22, CI 3.04, PVR 1.3 WU. 9. CKD: Stage III.   SH: Nonsmoker, no ETOH.  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   Family History  Problem Relation Age of Onset  . Heart disease Mother   . Diabetes Mother   . Hypertension Mother    ROS: All systems reviewed and negative except as per HPI.   Current Outpatient Prescriptions  Medication Sig Dispense Refill  . dronedarone (MULTAQ) 400 MG tablet Take 400 mg by mouth 2 (two) times daily with a meal.    . famotidine (PEPCID) 20 MG tablet Take 1 tablet (20 mg total) by mouth 2 (two) times daily. 60 tablet 1  . Febuxostat (ULORIC) 80 MG TABS Take 1 tablet by mouth daily.    . fenofibrate (TRICOR) 145 MG tablet Take 1 tablet (145 mg total) by mouth daily. 30 tablet 1  . HYDROcodone-acetaminophen (NORCO) 10-325 MG tablet Take 1 tablet by mouth 4 (four) times daily.    . metoprolol tartrate (LOPRESSOR) 25 MG tablet Take 25 mg  by mouth 2 (two) times daily.    Marland Kitchen omeprazole (PRILOSEC) 20 MG capsule Take 1 capsule (20 mg total) by mouth 2 (two) times daily before a meal. 60 capsule 1  . potassium chloride SA (K-DUR,KLOR-CON) 20 MEQ tablet Take 20 mEq by mouth daily.    . predniSONE (DELTASONE) 10 MG tablet Take 0.5 tablets (5 mg total) by mouth daily with breakfast. 30 tablet 2  . pregabalin (LYRICA) 200 MG capsule Take 1 capsule (200 mg total) by mouth 3 (three) times daily. Take 1 capsule three times a day. (Patient taking differently: Take 200 mg by mouth 3 (three) times daily. ) 90 capsule 0  . ranolazine (RANEXA) 1000 MG SR tablet  Take 1,000 mg by mouth 2 (two) times daily.    . rivaroxaban (XARELTO) 20 MG TABS tablet Take 20 mg by mouth daily with supper.    Marland Kitchen spironolactone (ALDACTONE) 25 MG tablet Take 50 mg by mouth daily.    Marland Kitchen testosterone cypionate (DEPOTESTOSTERONE CYPIONATE) 200 MG/ML injection Inject 200 mg into the muscle every 7 (seven) days. Wednesdays    . torsemide (DEMADEX) 20 MG tablet Take 2 tablets (40 mg total) by mouth daily. 60 tablet 3   No current facility-administered medications for this encounter.    BP 130/72 (BP Location: Left Arm, Patient Position: Sitting, Cuff Size: Large)   Pulse (!) 104   Ht 6' (1.829 m)   Wt (!) 358 lb (162.4 kg)   SpO2 96% Comment: on 7L pulsed  BMI 48.55 kg/m  General: NAD, obese Neck: Thick, JVP 10 cm, no thyromegaly or thyroid nodule.  Lungs: Slight dry crackles at bases bilaterally. CV: Nondisplaced PMI.  Heart mildly tachy, irregular S1/S2, no S3/S4, no murmur.  1+ chronic edema to knees.  No carotid bruit.  Normal pedal pulses.  Abdomen: Soft, nontender, no hepatosplenomegaly, no distention.  Skin: Intact without lesions or rashes.  Neurologic: Alert and oriented x 3.  Psych: Normal affect. Extremities: No clubbing or cyanosis.  HEENT: Normal.   Assessment/Plan: 1. Pulmonary fibrosis: Noted on HRCT.  He is on home oxygen. He is on prednisone.  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  - Will ask Dr. [name] if there is a role for Ofev or pirfenidone.  2. Atrial fibrillation and flutter: Patient is in typical flutter today, but has had episodes of atrial fibrillation in the past.  He has had several prior cardioversions.  He was on ranolazine and Multaq, but his insurance is not going to cover Multaq any longer.   - Continue Xarelto.  He has been on it without missing doses for > 30 days. Check CBC.  - I will arrange for DCCV next week.  I discussed risks/benefits with him, and he agrees to proceed.  - Long-term, I think he will either need  ablation or an anti-arrhythmic medication to keep him in rhythm.  He can continue ranolazine, but it is a fairly weak anti-arrhythmic. With significant CHF, I would keep him off Multaq. He would probably not be a good candidate for Ic agents given cardiomyopathy with EF down to 45% in the past.  Tikosyn or sotalol would be options (though QT is mildly prolonged and creatinine is impaired).  Would avoid amiodarone with significant lung dysfunction.  He is interested in having all his followup in [place], so I will have him evaluated by Dr. Johney Frame or [facility] for EP.  Question here will be whether or not he will ever be candidate for ablation.  His lung disease  will make intubation/general anesthesia difficult.  3. Chronic primarily diastolic CHF: Diastolic CHF with pulmonary venous hypertension on 12/17 echo.  NYHA class III symptoms (likely combination of heart and lungs as cause).  He does appear volume overloaded on exam.  - Increase torsemide to 40 qam/20 qpm x 3 days, then decrease to 40 mg daily. BMET/BNP today and BMET in 10 days.  - Echo to reassess LV and RV function.  4. CKD: Stage III.  Follow carefully with diuresis.   Marca Ancona 08/03/2016

## 2016-08-03 NOTE — Patient Instructions (Signed)
Increase Torsemide to 40 mg (2 tabs) in AM and 20 mg (1 tab) in PM FOR 3 DAYS ONLY, then take 40 mg (2 tabs) daily  Stay off Multaq  Continue Ranolazine  Labs today  Your physician has requested that you have an echocardiogram. Echocardiography is a painless test that uses sound waves to create images of your heart. It provides your doctor with information about the size and shape of your heart and how well your heart's chambers and valves are working. This procedure takes approximately one hour. There are no restrictions for this procedure.  You have been referred to EP doctors who specialize in a-flutter  Your physician recommends that you schedule a follow-up appointment in: 2 weeks   Atrial Flutter Atrial flutter is a type of abnormal heart rhythm (arrhythmia). In atrial flutter, the heartbeat is fast but regular. There are two types of atrial flutter:  Paroxysmal atrial flutter. This type starts suddenly. It usually stops on its own soon after it starts.  Permanent atrial flutter. This type does not go away. What are the causes? This condition may be caused by:  A heart condition or problem, such as:  A heart attack.  Heart failure.  A heart valve problem.  A lung problem, such as:  A blood clot in the lungs (pulmonary embolism, or PE).  Chronic obstructive pulmonary disease.  Poorly controlled high blood pressure (hypertension).  Hyperthyroidism.  Caffeine.  Some decongestant cold medicines.  Low levels of minerals called electrolytes in the blood.  Cocaine. What increases the risk? This condition is more likely to develop in:  Elderly adults.  Men. What are the signs or symptoms? Symptoms of this condition include:  A feeling that your heart is pounding or racing (palpitations).  Shortness of breath.  Chest pain.  Feeling light-headed.  Dizziness.  Fainting. How is this diagnosed? This condition may be diagnosed with tests, including:  An  electrocardiogram (ECG). This is a painless test that records electrical signals in the heart.  Holter monitoring. For this test, you wear a device that records your heartbeat for 1-2 days.  Cardiac event monitoring. For this test, you wear a device that records your heartbeat for up to 30 days.  An echocardiogram. This is a painless test that uses sound waves to make a picture of your heart.  Stress test. This test records your heartbeat while you exercise.  Blood tests. How is this treated? This condition may be treated with:  Treatment of any underlying conditions.  Medicine to make your heart beat more slowly.  Medicine to keep the condition from coming back.  A procedure to keep the condition under control. Some procedures to do this include:  Cardioversion. During this procedure, medicines or an electrical shock are given to make the heart beat normally.  Ablation. During this procedure, the heart tissue that is causing the problem is destroyed. This procedure may be done if atrial flutter lasts a long time or happens often. Follow these instructions at home:  Take over-the-counter and prescription medicines only as told by your health care provider.  Do not take any new medicines without talking to your health care provider.  Do not use tobacco products, including cigarettes, chewing tobacco, or e-cigarettes. If you need help quitting, ask your health care provider.  Limit alcohol intake to no more than 1 drink per day for nonpregnant women and 2 drinks per day for men. One drink equals 12 oz of beer, 5 oz of wine, or  1 oz of hard liquor.  Try to reduce any stress. Stress can make your symptoms worse. Contact a health care provider if:  Your symptoms get worse. Get help right away if:  You are dizzy.  You feel like fainting or you faint.  You have shortness of breath.  You feel pain or pressure in your chest.  You suddenly feel nauseous or you suddenly  vomit.  There is a sudden change in your ability to speak, eat, or move.  You are sweating a lot for no reason. This information is not intended to replace advice given to you by your health care provider. Make sure you discuss any questions you have with your health care provider. Document Released: 10/02/2008 Document Revised: 09/23/2015 Document Reviewed: 11/28/2014 Elsevier Interactive Patient Education  2017 [employer].

## 2016-08-05 ENCOUNTER — Telehealth: Payer: Self-pay | Admitting: Internal Medicine

## 2016-08-05 NOTE — Telephone Encounter (Signed)
Spoke with Synetta Fail and we do have this CMN. She is going to get this to MR to sign. I have left a message with Victorino Dike at [facility] to make her aware of this.

## 2016-08-12 ENCOUNTER — Encounter (HOSPITAL_COMMUNITY): Payer: Self-pay | Admitting: Anesthesiology

## 2016-08-12 ENCOUNTER — Encounter (HOSPITAL_COMMUNITY): Admission: RE | Payer: Self-pay | Source: Ambulatory Visit

## 2016-08-12 ENCOUNTER — Ambulatory Visit (HOSPITAL_COMMUNITY): Admission: RE | Admit: 2016-08-12 | Payer: Medicaid Other | Source: Ambulatory Visit | Admitting: Cardiology

## 2016-08-12 SURGERY — CARDIOVERSION
Anesthesia: Monitor Anesthesia Care

## 2016-08-16 ENCOUNTER — Encounter (HOSPITAL_COMMUNITY): Payer: Self-pay

## 2016-08-26 ENCOUNTER — Encounter (HOSPITAL_COMMUNITY): Payer: Self-pay

## 2016-08-26 ENCOUNTER — Ambulatory Visit (HOSPITAL_COMMUNITY): Payer: Medicaid Other

## 2016-08-26 ENCOUNTER — Ambulatory Visit (HOSPITAL_BASED_OUTPATIENT_CLINIC_OR_DEPARTMENT_OTHER)
Admission: RE | Admit: 2016-08-26 | Discharge: 2016-08-26 | Disposition: A | Discharge status: Home / Self Care | Payer: Medicaid Other | Source: Ambulatory Visit | Attending: Internal Medicine | Admitting: Internal Medicine

## 2016-08-26 ENCOUNTER — Encounter (HOSPITAL_COMMUNITY)
Admission: RE | Disposition: A | Discharge status: Home / Self Care | Payer: Self-pay | Source: Intra-hospital | Attending: Cardiology

## 2016-08-26 ENCOUNTER — Ambulatory Visit (HOSPITAL_COMMUNITY)
Admission: RE | Admit: 2016-08-26 | Discharge: 2016-08-26 | Disposition: A | Discharge status: Home / Self Care | Payer: Medicaid Other | Source: Intra-hospital | Attending: Cardiology | Admitting: Cardiology

## 2016-08-26 ENCOUNTER — Other Ambulatory Visit (HOSPITAL_COMMUNITY): Payer: Self-pay | Admitting: *Deleted

## 2016-08-26 ENCOUNTER — Ambulatory Visit (HOSPITAL_BASED_OUTPATIENT_CLINIC_OR_DEPARTMENT_OTHER)
Admission: RE | Admit: 2016-08-26 | Discharge: 2016-08-26 | Disposition: A | Discharge status: Home / Self Care | Payer: Medicaid Other | Source: Ambulatory Visit | Attending: Cardiology | Admitting: Cardiology

## 2016-08-26 ENCOUNTER — Ambulatory Visit (HOSPITAL_COMMUNITY): Payer: Medicaid Other | Admitting: Certified Registered Nurse Anesthetist

## 2016-08-26 VITALS — BP 140/70 | HR 94 | Wt 352.2 lb

## 2016-08-26 DIAGNOSIS — Z833 Family history of diabetes mellitus: Secondary | ICD-10-CM | POA: Diagnosis not present

## 2016-08-26 DIAGNOSIS — I481 Persistent atrial fibrillation: Secondary | ICD-10-CM | POA: Diagnosis not present

## 2016-08-26 DIAGNOSIS — I2721 Secondary pulmonary arterial hypertension: Secondary | ICD-10-CM | POA: Diagnosis not present

## 2016-08-26 DIAGNOSIS — I5032 Chronic diastolic (congestive) heart failure: Secondary | ICD-10-CM

## 2016-08-26 DIAGNOSIS — I4891 Unspecified atrial fibrillation: Secondary | ICD-10-CM | POA: Insufficient documentation

## 2016-08-26 DIAGNOSIS — I712 Thoracic aortic aneurysm, without rupture: Secondary | ICD-10-CM | POA: Diagnosis not present

## 2016-08-26 DIAGNOSIS — I130 Hypertensive heart and chronic kidney disease with heart failure and stage 1 through stage 4 chronic kidney disease, or unspecified chronic kidney disease: Secondary | ICD-10-CM | POA: Diagnosis not present

## 2016-08-26 DIAGNOSIS — Z8711 Personal history of peptic ulcer disease: Secondary | ICD-10-CM | POA: Diagnosis not present

## 2016-08-26 DIAGNOSIS — Z9981 Dependence on supplemental oxygen: Secondary | ICD-10-CM | POA: Diagnosis not present

## 2016-08-26 DIAGNOSIS — J8410 Pulmonary fibrosis, unspecified: Secondary | ICD-10-CM | POA: Diagnosis not present

## 2016-08-26 DIAGNOSIS — I429 Cardiomyopathy, unspecified: Secondary | ICD-10-CM | POA: Insufficient documentation

## 2016-08-26 DIAGNOSIS — K219 Gastro-esophageal reflux disease without esophagitis: Secondary | ICD-10-CM | POA: Insufficient documentation

## 2016-08-26 DIAGNOSIS — N183 Chronic kidney disease, stage 3 (moderate): Secondary | ICD-10-CM | POA: Insufficient documentation

## 2016-08-26 DIAGNOSIS — G4733 Obstructive sleep apnea (adult) (pediatric): Secondary | ICD-10-CM | POA: Diagnosis not present

## 2016-08-26 DIAGNOSIS — Z8249 Family history of ischemic heart disease and other diseases of the circulatory system: Secondary | ICD-10-CM | POA: Diagnosis not present

## 2016-08-26 DIAGNOSIS — Z7901 Long term (current) use of anticoagulants: Secondary | ICD-10-CM | POA: Insufficient documentation

## 2016-08-26 DIAGNOSIS — I4819 Other persistent atrial fibrillation: Secondary | ICD-10-CM

## 2016-08-26 DIAGNOSIS — Z6841 Body Mass Index (BMI) 40.0 and over, adult: Secondary | ICD-10-CM | POA: Insufficient documentation

## 2016-08-26 DIAGNOSIS — I5031 Acute diastolic (congestive) heart failure: Secondary | ICD-10-CM

## 2016-08-26 DIAGNOSIS — I4892 Unspecified atrial flutter: Secondary | ICD-10-CM | POA: Insufficient documentation

## 2016-08-26 DIAGNOSIS — I2729 Other secondary pulmonary hypertension: Secondary | ICD-10-CM | POA: Diagnosis not present

## 2016-08-26 HISTORY — PX: CARDIOVERSION: SHX1299

## 2016-08-26 LAB — ECHOCARDIOGRAM COMPLETE
FS: 14 % — AB (ref 28–44)
IVS/LV PW RATIO, ED: 0.94
LA diam end sys: 50 mm
LA vol: 86.8 mL
LADIAMINDEX: 1.83 cm/m2
LASIZE: 50 mm
LAVOLA4C: 87.1 mL
LAVOLIN: 31.8 mL/m2
LVOT area: 5.73 cm2
LVOTD: 27 mm
PW: 11.8 mm — AB (ref 0.6–1.1)
RV TAPSE: 16.3 mm

## 2016-08-26 LAB — BASIC METABOLIC PANEL
Anion gap: 10 (ref 5–15)
BUN: 20 mg/dL (ref 6–20)
CALCIUM: 9.2 mg/dL (ref 8.9–10.3)
CHLORIDE: 94 mmol/L — AB (ref 101–111)
CO2: 32 mmol/L (ref 22–32)
CREATININE: 1.76 mg/dL — AB (ref 0.61–1.24)
GFR calc Af Amer: 49 mL/min — ABNORMAL LOW (ref >60)
GFR calc non Af Amer: 42 mL/min — ABNORMAL LOW (ref >60)
Glucose, Bld: 100 mg/dL — ABNORMAL HIGH (ref 65–99)
Potassium: 4.2 mmol/L (ref 3.5–5.1)
Sodium: 136 mmol/L (ref 135–145)

## 2016-08-26 SURGERY — CARDIOVERSION
Anesthesia: General

## 2016-08-26 MED ORDER — PROPOFOL 10 MG/ML IV BOLUS
INTRAVENOUS | Status: DC | PRN
  Administered 2016-08-26: 100 mg via INTRAVENOUS

## 2016-08-26 MED ORDER — SODIUM CHLORIDE 0.9 % IV SOLN
INTRAVENOUS | Status: DC | PRN
  Administered 2016-08-26: 16:00:00 via INTRAVENOUS

## 2016-08-26 MED ORDER — FUROSEMIDE 80 MG PO TABS: 80.0000 mg | ORAL_TABLET | Freq: Every day | ORAL | 6 refills | Status: DC

## 2016-08-26 MED ORDER — PROPOFOL 10 MG/ML IV BOLUS
INTRAVENOUS | Status: AC
  Filled 2016-08-26: qty 20

## 2016-08-26 MED ORDER — LIDOCAINE HCL (CARDIAC) 20 MG/ML IV SOLN
INTRAVENOUS | Status: DC | PRN
  Administered 2016-08-26: 60 mg via INTRATRACHEAL

## 2016-08-26 NOTE — Interval H&P Note (Signed)
History and Physical Interval Note:  08/26/2016 3:53 PM  Adam Benson  has presented today for surgery, with the diagnosis of afib  The various methods of treatment have been discussed with the patient and family. After consideration of risks, benefits and other options for treatment, the patient has consented to  Procedure(s): CARDIOVERSION (N/A) as a surgical intervention .  [name] history has been reviewed, patient examined, no change in status, stable for surgery.  I have reviewed the patient's chart and labs.  Questions were answered to the patient's satisfaction.     Rafiel Mecca [employer]

## 2016-08-26 NOTE — Anesthesia Postprocedure Evaluation (Addendum)
Anesthesia Post Note  Patient: Adam Benson  Procedure(s) Performed: Procedure(s) (LRB): CARDIOVERSION (N/A)  Patient location during evaluation: PACU Anesthesia Type: General Level of consciousness: awake, awake and alert and oriented Pain management: pain level controlled Vital Signs Assessment: post-procedure vital signs reviewed and stable Respiratory status: spontaneous breathing, nonlabored ventilation and respiratory function stable Cardiovascular status: blood pressure returned to baseline Anesthetic complications: no       Last Vitals:  Vitals:   08/26/16 1528 08/26/16 1605  BP: 135/81   Pulse: 84 72  Resp: 16 14  Temp: 36.9 C 37 C    Last Pain:  Vitals:   08/26/16 1605  TempSrc: Oral                 Krista Godsil COKER

## 2016-08-26 NOTE — Procedures (Signed)
Electrical Cardioversion Procedure Note Adam Benson [id number] 1961/04/17  Procedure: Electrical Cardioversion Indications:  Atrial Fibrillation  Procedure Details Consent: Risks of procedure as well as the alternatives and risks of each were explained to the (patient/caregiver).  Consent for procedure obtained. Time Out: Verified patient identification, verified procedure, site/side was marked, verified correct patient position, special equipment/implants available, medications/allergies/relevent history reviewed, required imaging and test results available.  Performed  Patient placed on cardiac monitor, pulse oximetry, supplemental oxygen as necessary.  Sedation given: Propofol per anesthesiology Pacer pads placed anterior and posterior chest.  Cardioverted 1 time(s).  Cardioverted at 200J.  Evaluation Findings: Post procedure EKG shows: NSR Complications: None Patient did tolerate procedure well.   Marca Ancona 08/26/2016, 4:06 PM

## 2016-08-26 NOTE — Patient Instructions (Signed)
Routine lab work today. Will notify you of abnormal results, otherwise no news is good news!  Return in 2 weeks for repeat labs.  ______________________________________________  ______________________________________________  Adam Benson.  START Lasix 80 mg once daily.  Follow up 1 month with Dr. Shirlee Latch.  _________________________________________________  _________________________________________________  Do the following things EVERYDAY: 1) Weigh yourself in the morning before breakfast. Write it down and keep it in a log. 2) Take your medicines as prescribed 3) Eat low salt foods-Limit salt (sodium) to 2000 mg per day.  4) Stay as active as you can everyday 5) Limit all fluids for the day to less than 2 liters

## 2016-08-26 NOTE — Progress Notes (Signed)
PCP: O[facility]Buch Pulmonary: Dr. [name] Cardiology: Dr. 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  He has been followed in [facility] in the past for cardiology. Dr. [name] has been following him for pulmonary fibrosis. He had ARDs twice in 2016, once 1/16-2/16.  The second time, he had PNA developing into ARDS and was in the hospital for 3 months.  High resolution CT done 2/18 showed pulmonary fibrosis.  He is on home oxygen during the day and CPAP (for OSA) at night.    He has also had atrial arrhythmias, both atrial fibrillation and atrial flutter.  He has been cardioverted several times.  He has been on ranolazine and Multaq, but recently, his insurance company has said that it will not cover Multaq any longer and he is off it.  He is in atrial fibrillation today.  He missed his DCCV a couple of weeks ago and thought today's office visit was actually his DCCV appointment.  He is NPO and has not missed any Xarelto.     I took him for RHC in 12/17.  This showed mildly elevated filling pressures and pulmonary venous hypertension.   He remains short of breath after walking < 100 feet.  This is chronic.  He is short of breath walking up 1 flight steps.  No chest pain.  Weight is down 6 lbs.  At last appointment, I increased his torsemide.  He does not feel like this helped much.  He took his father's Lasix 80 mg po and feels like this worked much better.    I reviewed today's echo: EF 50% with normal-appearing RV.   Labs (12/17): K 4.6, creatinine 1.98, hgb 11.5 Labs (3/18): K 4.2, creatinine 1.79, BNP 53  PMH; 1. PUD 2. GERD 3. OSA: Using CPAP.  4. ARDS: 1/16-2/16 initially, then admitted again in 9/16 for 3 months with PNA and ARDS.  He developed post-inflammatory pulmonary fibrosis. 5. Pulmonary fibrosis: Suspect  post-inflammatory pulmonary fibrosis developing after PNA and ARDS.  - High resolution CT chest (2/18): Pulmonary fibrosis noted, looks like NSIP versus chronic hypersensitivity pneumonitis.  - On home oxygen.  6. Ascending aortic aneurysm:  4.6 cm on CT in 2/18.  7. Atrial arrhythmias: Atrial fibrillation and atrial flutter have both been noted.  He has had several cardioversions in the past.  Was followed at [facility], was on ranolazine + Multaq, but insurance will not cover Multaq.  8. Cardiomyopathy with chronic primarily diastolic CHF:  - Echo in [place] in the past with EF in 45% range.  - Echo (9/16) with EF 50-55%, moderate biatrial enlargement.  - RHC (12/17): mean 12, PA 44/24 mean 33, mean PCWP 22, CI 3.04, PVR 1.3 WU. - Echo (5/18): EF 50%, diffuse hypokinesis, normal RV size and systolic function.  9. CKD: Stage III.   SH: Nonsmoker, no ETOH.  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   Family History  Problem Relation Age of Onset  . Heart disease Mother   . Diabetes Mother   . Hypertension Mother    ROS: All systems reviewed and negative except as per HPI.   No current outpatient prescriptions on file.   No current facility-administered medications for this encounter.    BP 140/70   Pulse 94   Wt (!) 352 lb 4 oz (  159.8 kg)   SpO2 90% Comment: on 7L of O2  BMI 47.77 kg/m  General: NAD, obese Neck: Thick, JVP 8-9 cm, no thyromegaly or thyroid nodule.  Lungs: Slight dry crackles at bases bilaterally. CV: Nondisplaced PMI.  Heart irregular S1/S2, no S3/S4, no murmur.  1+ chronic edema to knees, unchanged.  No carotid bruit.  Normal pedal pulses.  Abdomen: Soft, nontender, no hepatosplenomegaly, no distention.  Skin: Intact without lesions or rashes.  Neurologic: Alert and oriented x 3.  Psych: Normal affect. Extremities: No clubbing or cyanosis.  HEENT: Normal.   Assessment/Plan: 1. Pulmonary fibrosis: Noted on HRCT.  He is on  home oxygen. He is on prednisone.  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  - Seen by Dr. [name], ?if there is a role for Ofev or pirfenidone.  2. Atrial fibrillation and flutter: Patient is in typical flutter today, but has had episodes of atrial fibrillation in the past.  He has had several prior cardioversions.  He was on ranolazine and Multaq, but his insurance is not going to cover Multaq any longer.   - Continue Xarelto.  He has not missed any doses.    - We are going to work him in for a DCCV today.   - Long-term, I think he will either need ablation or an anti-arrhythmic medication to keep him in rhythm.  He can continue ranolazine, but it is a fairly weak anti-arrhythmic. With significant CHF, I would keep him off Multaq. He would probably not be a good candidate for Ic agents given cardiomyopathy with EF down to 45% in the past and 50% on today's echo.  Tikosyn or sotalol would be options (though QT is mildly prolonged and creatinine is impaired).  Would avoid amiodarone with significant lung dysfunction.  He is interested in having all his followup in [place], has appointment with EP next week.  Question here will be whether or not he will ever be candidate for ablation.  His lung disease will make intubation/general anesthesia difficult.  3. Chronic primarily diastolic CHF: Diastolic CHF with pulmonary venous hypertension on 12/17 echo.  NYHA class III symptoms (likely combination of heart and lungs as cause).  Despite increase in torsemide at last appointment, he is still volume overloaded.  I reviewed today's echo, EF 50% with relatively normal-appearing RV.  He feels like Lasix works better than torsemide.   - Stop torsemide and start Lasix 80 mg po daily.  BMET today and at followup in 1 month.  4. CKD: Stage III.  Follow carefully with diuresis. Has been stable.   Will have DCCV today, then followup in 1 month.   Marca Ancona 08/26/2016

## 2016-08-26 NOTE — H&P (View-Only) (Signed)
PCP: O[facility] Pulmonary: Dr. [name] Cardiology: Dr. 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  He has been followed in [facility] in the past for cardiology. Dr. [name] has been following him for pulmonary fibrosis. He had ARDs twice in 2016, once 1/16-2/16.  The second time, he had PNA developing into ARDS and was in the hospital for 3 months.  High resolution CT done 2/18 showed pulmonary fibrosis.  He is on home oxygen during the day and CPAP (for OSA) at night.    He has also had atrial arrhythmias, both atrial fibrillation and atrial flutter.  He has been cardioverted several times.  He has been on ranolazine and Multaq, but recently, his insurance company has said that it will not cover Multaq any longer and he is off it.  He is in atrial flutter today.   He is short of breath after walking < 100 feet.  This is chronic.  He is short of breath walking up 1 flight steps.  He has occasional pleuritic chest pain when he is significantly short of breath.  I took him for RHC in 12/17.  This showed mildly elevated filling pressures and pulmonary venous hypertension.   ECG (personally reviewed): atrial flutter at 104, 2:1 block, QTc 494 msec.   Labs (12/17): K 4.6, creatinine 1.98, hgb 11.5  PMH; 1. PUD 2. GERD 3. OSA: Using CPAP.  4. ARDS: 1/16-2/16 initially, then admitted again in 9/16 for 3 months with PNA and ARDS.  He developed post-inflammatory pulmonary fibrosis. 5. 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  - High resolution CT chest (2/18): Pulmonary fibrosis noted, looks like NSIP versus chronic hypersensitivity pneumonitis.  - On home oxygen.  6. Ascending aortic aneurysm:  4.6 cm on CT in 2/18.  7. Atrial arrhythmias: Atrial fibrillation and atrial flutter  have both been noted.  He has had several cardioversions in the past.  Was followed at [facility], was on ranolazine + Multaq, but insurance will not cover Multaq.  8. Cardiomyopathy with chronic primarily diastolic CHF:  - Echo in [place] in the past with EF in 45% range.  - Echo (9/16) with EF 50-55%, moderate biatrial enlargement.  - RHC (12/17): mean 12, PA 44/24 mean 33, mean PCWP 22, CI 3.04, PVR 1.3 WU. 9. CKD: Stage III.   SH: Nonsmoker, no ETOH.  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   Family History  Problem Relation Age of Onset  . Heart disease Mother   . Diabetes Mother   . Hypertension Mother    ROS: All systems reviewed and negative except as per HPI.   Current Outpatient Prescriptions  Medication Sig Dispense Refill  . dronedarone (MULTAQ) 400 MG tablet Take 400 mg by mouth 2 (two) times daily with a meal.    . famotidine (PEPCID) 20 MG tablet Take 1 tablet (20 mg total) by mouth 2 (two) times daily. 60 tablet 1  . Febuxostat (ULORIC) 80 MG TABS Take 1 tablet by mouth daily.    . fenofibrate (TRICOR) 145 MG tablet Take 1 tablet (145 mg total) by mouth daily. 30 tablet 1  . HYDROcodone-acetaminophen (NORCO) 10-325 MG tablet Take 1 tablet by mouth 4 (four) times daily.    . metoprolol tartrate (LOPRESSOR) 25 MG tablet Take 25 mg  by mouth 2 (two) times daily.    Marland Kitchen omeprazole (PRILOSEC) 20 MG capsule Take 1 capsule (20 mg total) by mouth 2 (two) times daily before a meal. 60 capsule 1  . potassium chloride SA (K-DUR,KLOR-CON) 20 MEQ tablet Take 20 mEq by mouth daily.    . predniSONE (DELTASONE) 10 MG tablet Take 0.5 tablets (5 mg total) by mouth daily with breakfast. 30 tablet 2  . pregabalin (LYRICA) 200 MG capsule Take 1 capsule (200 mg total) by mouth 3 (three) times daily. Take 1 capsule three times a day. (Patient taking differently: Take 200 mg by mouth 3 (three) times daily. ) 90 capsule 0  . ranolazine (RANEXA) 1000 MG SR tablet  Take 1,000 mg by mouth 2 (two) times daily.    . rivaroxaban (XARELTO) 20 MG TABS tablet Take 20 mg by mouth daily with supper.    Marland Kitchen spironolactone (ALDACTONE) 25 MG tablet Take 50 mg by mouth daily.    Marland Kitchen testosterone cypionate (DEPOTESTOSTERONE CYPIONATE) 200 MG/ML injection Inject 200 mg into the muscle every 7 (seven) days. Wednesdays    . torsemide (DEMADEX) 20 MG tablet Take 2 tablets (40 mg total) by mouth daily. 60 tablet 3   No current facility-administered medications for this encounter.    BP 130/72 (BP Location: Left Arm, Patient Position: Sitting, Cuff Size: Large)   Pulse (!) 104   Ht 6' (1.829 m)   Wt (!) 358 lb (162.4 kg)   SpO2 96% Comment: on 7L pulsed  BMI 48.55 kg/m  General: NAD, obese Neck: Thick, JVP 10 cm, no thyromegaly or thyroid nodule.  Lungs: Slight dry crackles at bases bilaterally. CV: Nondisplaced PMI.  Heart mildly tachy, irregular S1/S2, no S3/S4, no murmur.  1+ chronic edema to knees.  No carotid bruit.  Normal pedal pulses.  Abdomen: Soft, nontender, no hepatosplenomegaly, no distention.  Skin: Intact without lesions or rashes.  Neurologic: Alert and oriented x 3.  Psych: Normal affect. Extremities: No clubbing or cyanosis.  HEENT: Normal.   Assessment/Plan: 1. Pulmonary fibrosis: Noted on HRCT.  He is on home oxygen. He is on prednisone.  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  - Will ask Dr. [name] if there is a role for Ofev or pirfenidone.  2. Atrial fibrillation and flutter: Patient is in typical flutter today, but has had episodes of atrial fibrillation in the past.  He has had several prior cardioversions.  He was on ranolazine and Multaq, but his insurance is not going to cover Multaq any longer.   - Continue Xarelto.  He has been on it without missing doses for > 30 days. Check CBC.  - I will arrange for DCCV next week.  I discussed risks/benefits with him, and he agrees to proceed.  - Long-term, I think he will either need  ablation or an anti-arrhythmic medication to keep him in rhythm.  He can continue ranolazine, but it is a fairly weak anti-arrhythmic. With significant CHF, I would keep him off Multaq. He would probably not be a good candidate for Ic agents given cardiomyopathy with EF down to 45% in the past.  Tikosyn or sotalol would be options (though QT is mildly prolonged and creatinine is impaired).  Would avoid amiodarone with significant lung dysfunction.  He is interested in having all his followup in [place], so I will have him evaluated by Dr. Johney Frame or [facility] for EP.  Question here will be whether or not he will ever be candidate for ablation.  His lung disease  will make intubation/general anesthesia difficult.  3. Chronic primarily diastolic CHF: Diastolic CHF with pulmonary venous hypertension on 12/17 echo.  NYHA class III symptoms (likely combination of heart and lungs as cause).  He does appear volume overloaded on exam.  - Increase torsemide to 40 qam/20 qpm x 3 days, then decrease to 40 mg daily. BMET/BNP today and BMET in 10 days.  - Echo to reassess LV and RV function.  4. CKD: Stage III.  Follow carefully with diuresis.   Marca Ancona 08/03/2016

## 2016-08-26 NOTE — Anesthesia Preprocedure Evaluation (Addendum)
Anesthesia Evaluation  Patient identified by MRN, date of birth, ID band Patient awake    Reviewed: Allergy & Precautions, NPO status   History of Anesthesia Complications Negative for: history of anesthetic complications  Airway Mallampati: III  TM Distance: >3 FB     Dental  (+) Teeth Intact   Pulmonary shortness of breath and Long-Term Oxygen Therapy,  Chronic lung disease and fibrosis    + decreased breath sounds      Cardiovascular hypertension, + dysrhythmias Atrial Fibrillation  Rhythm:Irregular Rate:Normal     Neuro/Psych  Neuromuscular disease    GI/Hepatic   Endo/Other  Morbid obesityMassively obese  Renal/GU      Musculoskeletal   Abdominal (+) + obese,   Peds  Hematology   Anesthesia Other Findings   Reproductive/Obstetrics                           Anesthesia Physical Anesthesia Plan  ASA: IV  Anesthesia Plan: General   Post-op Pain Management:    Induction: Intravenous  Airway Management Planned: Mask  Additional Equipment:   Intra-op Plan:   Post-operative Plan:   Informed Consent: I have reviewed the patients History and Physical, chart, labs and discussed the procedure including the risks, benefits and alternatives for the proposed anesthesia with the patient or authorized representative who has indicated his/her understanding and acceptance.     Plan Discussed with: CRNA  Anesthesia Plan Comments:         Anesthesia Quick Evaluation

## 2016-08-26 NOTE — Transfer of Care (Signed)
Immediate Anesthesia Transfer of Care Note  Patient: Adam Benson  Procedure(s) Performed: Procedure(s): CARDIOVERSION (N/A)  Patient Location: Endoscopy Unit  Anesthesia Type:General  Level of Consciousness: awake, alert  and oriented  Airway & Oxygen Therapy: Patient Spontanous Breathing and Patient connected to nasal cannula oxygen  Post-op Assessment: Report given to RN, Post -op Vital signs reviewed and stable and Patient moving all extremities  Post vital signs: Reviewed and stable  Last Vitals:  Vitals:   08/26/16 1528 08/26/16 1605  BP: 135/81   Pulse: 84 72  Resp: 16 14  Temp: 36.9 C 37 C    Last Pain:  Vitals:   08/26/16 1605  TempSrc: Oral         Complications: No apparent anesthesia complications

## 2016-08-26 NOTE — Discharge Instructions (Signed)
Electrical Cardioversion, Care After °This sheet gives you information about how to care for yourself after your procedure. Your health care provider may also give you more specific instructions. If you have problems or questions, contact your health care provider. °What can I expect after the procedure? °After the procedure, it is common to have: °· Some redness on the skin where the shocks were given. °Follow these instructions at home: °· Do not drive for 24 hours if you were given a medicine to help you relax (sedative). °· Take over-the-counter and prescription medicines only as told by your health care provider. °· Ask your health care provider how to check your pulse. Check it often. °· Rest for 48 hours after the procedure or as told by your health care provider. °· Avoid or limit your caffeine use as told by your health care provider. °Contact a health care provider if: °· You feel like your heart is beating too quickly or your pulse is not regular. °· You have a serious muscle cramp that does not go away. °Get help right away if: °· You have discomfort in your chest. °· You are dizzy or you feel faint. °· You have trouble breathing or you are short of breath. °· Your speech is slurred. °· You have trouble moving an arm or leg on one side of your body. °· Your fingers or toes turn cold or blue. °This information is not intended to replace advice given to you by your health care provider. Make sure you discuss any questions you have with your health care provider. °Document Released: 03/06/2013 Document Revised: 12/18/2015 Document Reviewed: 11/20/2015 °Elsevier Interactive Patient Education © 2017 [employer] ° °

## 2016-08-26 NOTE — Progress Notes (Signed)
  Echocardiogram 2D Echocardiogram has been performed.  Delcie Roch 08/26/2016, 12:41 PM

## 2016-08-26 NOTE — Anesthesia Procedure Notes (Signed)
Date/Time: 08/26/2016 3:53 PM Performed by: Orvilla Fus A Oxygen Delivery Method: Ambu bag Intubation Type: IV induction

## 2016-08-29 ENCOUNTER — Encounter (HOSPITAL_COMMUNITY): Payer: Self-pay | Admitting: Cardiology

## 2016-08-31 ENCOUNTER — Encounter: Payer: Self-pay | Admitting: Internal Medicine

## 2016-08-31 ENCOUNTER — Ambulatory Visit (INDEPENDENT_AMBULATORY_CARE_PROVIDER_SITE_OTHER): Payer: Medicaid Other | Admitting: Internal Medicine

## 2016-08-31 VITALS — BP 130/80 | HR 67 | Ht 72.0 in | Wt 337.0 lb

## 2016-08-31 DIAGNOSIS — I480 Paroxysmal atrial fibrillation: Secondary | ICD-10-CM

## 2016-08-31 DIAGNOSIS — I483 Typical atrial flutter: Secondary | ICD-10-CM

## 2016-08-31 NOTE — Progress Notes (Signed)
ELECTROPHYSIOLOGY CONSULT NOTE  Patient ID: Adam Benson, MRN: [id number], DOB/AGE: [id number] y.o. Admit date: (Not on file) Date of Consult: 08/31/2016  Primary Physician: Otila Back Primary Cardiologist: [facility]  Chief Complaint: Atrial fib   HPI Adam Benson is a 56 y.o. male  Seen at the request of Dr. Shirlee Latch for atrial arrhythmias.  He had been seen by Dr. Clydie Braun in [facility]. He been managed medically with a combination of dronaderone and ranolazine for atrial fibrillation. He had recurrences of atrial flutter typical and atypical. He been considered for catheter ablation but there were concerns regarding poor lung function.  Unfortunately, he has had recurrences on the dronaderone; moreover, his insurance company is no longer willing to pay for  Chronic lower extremity edema.     Echocardiogram demonstrated EF of 45% with moderate MR; Echo 3/18 EF 50% with a normal-appearing RV. Right heart catheterization 12/17 had demonstrated only mild elevation of filling pressures and pulmonary venous hypertension.   He has a history of interstitial lung disease and prior recurrent ARDS.He also has a history of obstructive sleep apnea. He is on chronic oxygen supplementation     Past Medical History:  Diagnosis Date  . Chronic atrial fibrillation (HCC)   . GERD (gastroesophageal reflux disease)   . Gout   . History of pneumonia 05/2014    two month hosp stay, vent, trach   . History of tracheostomy 06/2014>>out 07/2014   during 2016 hosp stay /vent  . HTN (hypertension), benign   . Hyperlipemia   . Insomnia   . Nerve damage    right arm  . Obesity   . Peptic ulcer 04/2014  . Pneumonia       Surgical History:  Past Surgical History:  Procedure Laterality Date  . APPENDECTOMY    . arm surgery     right  . CARDIAC CATHETERIZATION N/A 04/29/2016   Procedure: Right Heart Cath;  Surgeon: Laurey Morale, MD;  Location: [facility]  INVASIVE CV LAB;  Service: Cardiovascular;  Laterality: N/A;  . CARDIOVERSION N/A 08/26/2016   Procedure: CARDIOVERSION;  Surgeon: Laurey Morale, MD;  Location: [facility] ENDOSCOPY;  Service: Cardiovascular;  Laterality: N/A;  . KNEE SURGERY     right     Home Meds: Prior to Admission medications   Medication Sig Start Date End Date Taking? Authorizing Provider  famotidine (PEPCID) 20 MG tablet Take 1 tablet (20 mg total) by mouth 2 (two) times daily. 03/20/15  Yes Daniel J Angiulli, PA-C  Febuxostat (ULORIC) 80 MG TABS Take 1 tablet by mouth daily.   Yes Historical Provider, MD  fenofibrate (TRICOR) 145 MG tablet Take 1 tablet (145 mg total) by mouth daily. 03/20/15  Yes Daniel J Angiulli, PA-C  furosemide (LASIX) 80 MG tablet Take 1 tablet (80 mg total) by mouth daily. 08/26/16 11/24/16 Yes Laurey Morale, MD  HYDROcodone-acetaminophen [facility]) 10-325 MG tablet Take 1 tablet by mouth 4 (four) times daily.   Yes Historical Provider, MD  metoprolol tartrate (LOPRESSOR) 25 MG tablet Take 25 mg by mouth 2 (two) times daily.   Yes Historical Provider, MD  omeprazole (PRILOSEC) 20 MG capsule Take 1 capsule (20 mg total) by mouth 2 (two) times daily before a meal. 03/20/15  Yes Daniel J Angiulli, PA-C  potassium chloride SA (K-DUR,KLOR-CON) 20 MEQ tablet Take 20 mEq by mouth daily.   Yes Historical Provider, MD  predniSONE (DELTASONE) 10 MG tablet Take 0.5 tablets (  5 mg total) by mouth daily with breakfast. 07/05/16  Yes Kalman Shan, MD  pregabalin (LYRICA) 200 MG capsule Take 1 capsule (200 mg total) by mouth 3 (three) times daily. Take 1 capsule three times a day. 03/20/15  Yes Daniel J Angiulli, PA-C  ranolazine (RANEXA) 1000 MG SR tablet Take 1,000 mg by mouth 2 (two) times daily. 03/30/16 08/03/17 Yes Historical Provider, MD  rivaroxaban (XARELTO) 20 MG TABS tablet Take 20 mg by mouth daily with supper.   Yes Historical Provider, MD  spironolactone (ALDACTONE) 25 MG tablet Take 50 mg by mouth daily.  01/27/16  Yes Historical Provider, MD  testosterone cypionate (DEPOTESTOSTERONE CYPIONATE) 200 MG/ML injection Inject 200 mg into the muscle every 7 (seven) days. Wednesdays   Yes Historical Provider, MD    Allergies:  Allergies  Allergen Reactions  . Penicillins Hives and Shortness Of Breath    Has patient had a PCN reaction causing immediate rash, facial/tongue/throat swelling, SOB or lightheadedness with hypotension: UNKNOWN Has patient had a PCN reaction causing severe rash involving mucus membranes or skin necrosis: No Has patient had a PCN reaction that required hospitalization No Has patient had a PCN reaction occurring within the last 10 years: No If all of the above answers are "NO", then may proceed with Cephalosporin use.   Marland Kitchen Hydrochlorothiazide Other (See Comments)    hypercalemia    Social History   Social History  . Marital status: Divorced    Spouse name: N/A  . Number of children: N/A  . Years of education: N/A   Occupational History  . Not on file.   Social History Main Topics  . Smoking status: Never Smoker  . Smokeless tobacco: Current User    Types: Chew  . Alcohol use No  . Drug use: Unknown  . Sexual activity: Not on file   Other Topics Concern  . Not on file   Social History Narrative   Lives alone. Divorced. No known exposure to mold. No recent travel. No hot tub exposure.        Family History  Problem Relation Age of Onset  . Heart disease Mother   . Diabetes Mother   . Hypertension Mother      ROS:  Please see the history of present illness.     All other systems reviewed and negative.    Physical Exam:  Blood pressure 130/80, pulse 67, height 6' (1.829 m), weight (!) 337 lb (152.9 kg), SpO2 97 %. General: Well developed, Morbidly obese male in no acute distress. Head: Normocephalic, atraumatic, sclera non-icteric, no xanthomas, nares are without discharge. EENT: normal  Lymph Nodes:  none Neck: Negative for carotid bruits. JVD  8-10 Back:without scoliosis kyphosis Lungs: Clear bilaterally to auscultation without wheezes, rales, or rhonchi. Breathing is unlabored. Heart: RRR with S1 S2. 2/6 murmur . No rubs, or gallops appreciated. Abdomen: Soft, non-tender, non-distended with normoactive bowel sounds. No hepatomegaly. No rebound/guarding. No obvious abdominal masses. Msk:  Strength and tone appear normal for age. Extremities: No clubbing or cyanosis. 3* edema.  Distal pedal pulses are 2+ and equal bilaterally. Skin: Warm and Dry Neuro: Alert and oriented X 3. CN III-XII intact Grossly normal sensory and motor function . Psych:  Responds to questions appropriately with a normal affect.      Labs: Cardiac Enzymes No results for input(s): CKTOTAL, CKMB, TROPONINI in the last 72 hours. CBC Lab Results  Component Value Date   WBC 8.5 08/03/2016   HGB 13.4 08/03/2016  HCT 45.0 08/03/2016   MCV 74.0 (L) 08/03/2016   PLT 207 08/03/2016   PROTIME: No results for input(s): LABPROT, INR in the last 72 hours. Chemistry  Recent Labs Lab 08/26/16 1447  NA 136  K 4.2  CL 94*  CO2 32  BUN 20  CREATININE 1.76*  CALCIUM 9.2  GLUCOSE 100*   Lipids Lab Results  Component Value Date   TRIG 119 02/20/2015   BNP No results found for: PROBNP Thyroid Function Tests: No results for input(s): TSH, T4TOTAL, T3FREE, THYROIDAB in the last 72 hours.  Invalid input(s): FREET3 Miscellaneous No results found for: DDIMER  Radiology/Studies:  No results found.  EKG Personally reviewed  Sinus rhythm with a QTC of 440 ms 10/16  Afib 3/18 Atrial flutter typical    Assessment and Plan:   Atrial fib/Flutter  Renal Insufficiency  Grade 3  Obesity  NICM   I spoke with Dr. Clydie Braun. We were both in agreement that dofetilide would be a reasonable next choice. His renal function is not quite normal. We would need to start him on a lower dose.  I have also discussed this with Dr. Shirlee Latch who is in  agreement.  We have reviewed hospitalization 2/2 risk of proarrhtyhmia   We will relay this information to the patient and he can decide whether he would like to have this done, or at [facility].  he will follow-up with Dr. Sampson Goon for consideration of ablation.   In this regard it is noteworthy that he does not have significant symptoms clearly attributable to his arrhythmia; he was no better following his cardioversion on Friday.  On Anticoagulation;  No bleeding issues     Sherryl Manges

## 2016-08-31 NOTE — Patient Instructions (Signed)
Medication Instructions: - Your physician recommends that you continue on your current medications as directed. Please refer to the Current Medication list given to you today.  Labwork: - none ordered  Procedures/Testing: - none ordered  Follow-Up: - Dr. Klein will see you back on an as needed basis.  Any Additional Special Instructions Will Be Listed Below (If Applicable).     If you need a refill on your cardiac medications before your next appointment, please call your pharmacy.   

## 2016-09-09 ENCOUNTER — Ambulatory Visit (HOSPITAL_COMMUNITY)
Admission: RE | Admit: 2016-09-09 | Discharge: 2016-09-09 | Disposition: A | Discharge status: Home / Self Care | Payer: Medicaid Other | Source: Ambulatory Visit | Attending: Cardiology | Admitting: Cardiology

## 2016-09-09 DIAGNOSIS — I2729 Other secondary pulmonary hypertension: Secondary | ICD-10-CM | POA: Diagnosis not present

## 2016-09-09 LAB — BASIC METABOLIC PANEL
ANION GAP: 15 (ref 5–15)
BUN: 20 mg/dL (ref 6–20)
CALCIUM: 9.7 mg/dL (ref 8.9–10.3)
CO2: 35 mmol/L — ABNORMAL HIGH (ref 22–32)
Chloride: 90 mmol/L — ABNORMAL LOW (ref 101–111)
Creatinine, Ser: 1.8 mg/dL — ABNORMAL HIGH (ref 0.61–1.24)
GFR, EST AFRICAN AMERICAN: 48 mL/min — AB (ref >60)
GFR, EST NON AFRICAN AMERICAN: 41 mL/min — AB (ref >60)
GLUCOSE: 105 mg/dL — AB (ref 65–99)
POTASSIUM: 4.5 mmol/L (ref 3.5–5.1)
Sodium: 140 mmol/L (ref 135–145)

## 2016-10-05 ENCOUNTER — Telehealth: Payer: Self-pay | Admitting: Pharmacist

## 2016-10-05 ENCOUNTER — Ambulatory Visit (HOSPITAL_COMMUNITY)
Admission: RE | Admit: 2016-10-05 | Discharge: 2016-10-05 | Disposition: A | Discharge status: Home / Self Care | Payer: Medicaid Other | Source: Ambulatory Visit | Attending: Cardiology | Admitting: Cardiology

## 2016-10-05 ENCOUNTER — Encounter (HOSPITAL_COMMUNITY): Payer: Self-pay

## 2016-10-05 VITALS — BP 127/76 | HR 84 | Ht 72.0 in | Wt 347.0 lb

## 2016-10-05 DIAGNOSIS — I481 Persistent atrial fibrillation: Secondary | ICD-10-CM

## 2016-10-05 DIAGNOSIS — I5032 Chronic diastolic (congestive) heart failure: Secondary | ICD-10-CM | POA: Diagnosis not present

## 2016-10-05 DIAGNOSIS — I4891 Unspecified atrial fibrillation: Secondary | ICD-10-CM | POA: Insufficient documentation

## 2016-10-05 DIAGNOSIS — Z7901 Long term (current) use of anticoagulants: Secondary | ICD-10-CM | POA: Insufficient documentation

## 2016-10-05 DIAGNOSIS — Z9981 Dependence on supplemental oxygen: Secondary | ICD-10-CM | POA: Insufficient documentation

## 2016-10-05 DIAGNOSIS — J8410 Pulmonary fibrosis, unspecified: Secondary | ICD-10-CM | POA: Insufficient documentation

## 2016-10-05 DIAGNOSIS — I482 Chronic atrial fibrillation, unspecified: Secondary | ICD-10-CM

## 2016-10-05 DIAGNOSIS — I4819 Other persistent atrial fibrillation: Secondary | ICD-10-CM

## 2016-10-05 DIAGNOSIS — N183 Chronic kidney disease, stage 3 (moderate): Secondary | ICD-10-CM | POA: Insufficient documentation

## 2016-10-05 DIAGNOSIS — I4892 Unspecified atrial flutter: Secondary | ICD-10-CM | POA: Insufficient documentation

## 2016-10-05 DIAGNOSIS — I130 Hypertensive heart and chronic kidney disease with heart failure and stage 1 through stage 4 chronic kidney disease, or unspecified chronic kidney disease: Secondary | ICD-10-CM | POA: Insufficient documentation

## 2016-10-05 LAB — BASIC METABOLIC PANEL
Anion gap: 11 (ref 5–15)
BUN: 20 mg/dL (ref 6–20)
CALCIUM: 9.1 mg/dL (ref 8.9–10.3)
CHLORIDE: 94 mmol/L — AB (ref 101–111)
CO2: 34 mmol/L — ABNORMAL HIGH (ref 22–32)
CREATININE: 1.79 mg/dL — AB (ref 0.61–1.24)
GFR calc non Af Amer: 42 mL/min — ABNORMAL LOW (ref >60)
GFR, EST AFRICAN AMERICAN: 48 mL/min — AB (ref >60)
Glucose, Bld: 108 mg/dL — ABNORMAL HIGH (ref 65–99)
Potassium: 3.9 mmol/L (ref 3.5–5.1)
SODIUM: 139 mmol/L (ref 135–145)

## 2016-10-05 LAB — MAGNESIUM: MAGNESIUM: 2.1 mg/dL (ref 1.7–2.4)

## 2016-10-05 LAB — BRAIN NATRIURETIC PEPTIDE: B NATRIURETIC PEPTIDE 5: 77.3 pg/mL (ref 0.0–100.0)

## 2016-10-05 MED ORDER — FUROSEMIDE 80 MG PO TABS: ORAL_TABLET | ORAL | 6 refills | Status: DC

## 2016-10-05 MED ORDER — POTASSIUM CHLORIDE CRYS ER 20 MEQ PO TBCR: 40.0000 meq | EXTENDED_RELEASE_TABLET | Freq: Every day | ORAL | 3 refills | Status: DC

## 2016-10-05 NOTE — Telephone Encounter (Addendum)
Pt pending Tikosyn initiation. Pt anticoagulated on Xarelto dosed appropriately based on Crcl. Will need to ensure no missed doses in last 3 weeks. K below 4 on most recent panel, but K supplementation increased today. Mag ok on most recent panel. QTc borderline today. Pt on amio- stopped in 2016 and multaq stopped in 07/2016 - sufficient wash out period. Only QTc prolonging medication is Ranexa - but this medication is low risk for TdP.

## 2016-10-05 NOTE — Telephone Encounter (Deleted)
Pt previously on amio - stopped in 2016 and multaq - stopped in 07/2016.

## 2016-10-05 NOTE — Patient Instructions (Signed)
Increase Furosemide to 80 mg in AM and 40 mg (1/2 tab) in PM  Increase Potassium to 40 meq (2 tabs) daily  Labs today  Labs in 10 days  You have been referred to Rudi Coco, NP in the a-fib clinic  Your physician recommends that you schedule a follow-up appointment in: 1 month

## 2016-10-05 NOTE — Progress Notes (Signed)
PCP: [facility] Pulmonary: Dr. [name] Cardiology: Dr. 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  He has been followed in [facility] in the past for cardiology. Dr. [name] has been following him for pulmonary fibrosis. He had ARDs twice in 2016, once 1/16-2/16.  The second time, he had PNA developing into ARDS and was in the hospital for 3 months.  High resolution CT done 2/18 showed pulmonary fibrosis.  He is on home oxygen during the day and CPAP (for OSA) at night.  He is on prednisone 5 mg daily for pulmonary fibrosis.   I took him for RHC in 12/17.  This showed mildly elevated filling pressures and pulmonary venous hypertension.   He has also had atrial arrhythmias, both atrial fibrillation and atrial flutter.  He has been cardioverted several times.  He has been on ranolazine and Multaq, but recently, his insurance company has said that it will not cover Multaq any longer and he is off it.  He had DCCV to NSR in 3/18 but is back in atrial fibrillation today.    He is able to walk about 100 yards now before becoming short of breath.  He walked a mile recently but had to take breaks.  His weight is down 5 lbs since last appointment.  No chest pain.  No lightheadedness. He does not feel palpitations.   Labs (12/17): K 4.6, creatinine 1.98, hgb 11.5 Labs (3/18): K 4.2, creatinine 1.79, BNP 53, hgb 13.4 Labs (4/18): K 4.5, creatinine 1.8  ECG (personally reviewed): atrial fibrillation at 75, QTc 448  PMH; 1. PUD 2. GERD 3. OSA: Using CPAP.  4. ARDS: 1/16-2/16 initially, then admitted again in 9/16 for 3 months with PNA and ARDS.  He developed post-inflammatory pulmonary fibrosis. 5. 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  - High resolution CT chest (2/18): Pulmonary  fibrosis noted, looks like NSIP versus chronic hypersensitivity pneumonitis.  - On home oxygen.  6. Ascending aortic aneurysm:  4.6 cm on CT in 2/18.  7. Atrial arrhythmias: Atrial fibrillation and atrial flutter have both been noted.  He has had several cardioversions in the past.  Was followed at [facility], was on ranolazine + Multaq, but insurance will not cover Multaq.  - DCCV 3/18 to NSR, back in atrial fibrillation by 5/18.  8. Cardiomyopathy with chronic primarily diastolic CHF:  - Echo in [place] in the past with EF in 45% range.  - Echo (9/16) with EF 50-55%, moderate biatrial enlargement.  - RHC (12/17): mean 12, PA 44/24 mean 33, mean PCWP 22, CI 3.04, PVR 1.3 WU. - Echo (3/18): EF 50%, diffuse hypokinesis, normal RV size and systolic function.  9. CKD: Stage III.   SH: Nonsmoker, no ETOH.  He used to be an Personnel officer, now on disability.  Lives in [place].   Family History  Problem Relation Age of Onset  . Heart disease Mother   . Diabetes Mother   . Hypertension Mother    ROS: All systems reviewed and negative except as per HPI.   Current Outpatient Prescriptions  Medication Sig Dispense Refill  . famotidine (PEPCID) 20 MG tablet Take 1 tablet (20 mg total) by mouth 2 (two) times daily. 60 tablet 1  . Febuxostat (ULORIC) 80 MG TABS Take 1 tablet by mouth daily.    . fenofibrate (  TRICOR) 145 MG tablet Take 1 tablet (145 mg total) by mouth daily. 30 tablet 1  . furosemide (LASIX) 80 MG tablet Take 1 tab in AM and 1/2 tab in PM 45 tablet 6  . HYDROcodone-acetaminophen (NORCO) 10-325 MG tablet Take 1 tablet by mouth 4 (four) times daily.    . metoprolol tartrate (LOPRESSOR) 25 MG tablet Take 25 mg by mouth 2 (two) times daily.    . potassium chloride SA (K-DUR,KLOR-CON) 20 MEQ tablet Take 2 tablets (40 mEq total) by mouth daily. 60 tablet 3  . predniSONE (DELTASONE) 10 MG tablet Take 0.5 tablets (5 mg total) by mouth daily with breakfast. 30 tablet 2  . pregabalin  (LYRICA) 200 MG capsule Take 1 capsule (200 mg total) by mouth 3 (three) times daily. Take 1 capsule three times a day. 90 capsule 0  . ranolazine (RANEXA) 1000 MG SR tablet Take 1,000 mg by mouth 2 (two) times daily.    . rivaroxaban (XARELTO) 20 MG TABS tablet Take 20 mg by mouth daily with supper.    Marland Kitchen spironolactone (ALDACTONE) 25 MG tablet Take 50 mg by mouth daily.    Marland Kitchen testosterone cypionate (DEPOTESTOSTERONE CYPIONATE) 200 MG/ML injection Inject 200 mg into the muscle every 7 (seven) days. Wednesdays     No current facility-administered medications for this encounter.    BP 127/76 (BP Location: Left Arm, Patient Position: Sitting, Cuff Size: Large)   Pulse 84   Ht 6' (1.829 m)   Wt (!) 347 lb (157.4 kg)   SpO2 98%   BMI 47.06 kg/m  General: NAD, obese Neck: Thick, JVP 9-10 cm, no thyromegaly or thyroid nodule.  Lungs: Slight dry crackles at bases bilaterally. CV: Nondisplaced PMI.  Heart irregular S1/S2, no S3/S4, no murmur.  Trace ankle edema.  No carotid bruit.  Normal pedal pulses.  Abdomen: Soft, nontender, no hepatosplenomegaly, no distention.  Skin: Intact without lesions or rashes.  Neurologic: Alert and oriented x 3.  Psych: Normal affect. Extremities: No clubbing or cyanosis.  HEENT: Normal.   Assessment/Plan: 1. Pulmonary fibrosis: Noted on HRCT.  He is on home oxygen. He is on prednisone 5 mg daily.  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  - Seen by Dr. [name], ?if there is a role for Ofev or pirfenidone.  2. Atrial fibrillation and flutter: Patient was cardioverted in 3/18 to NSR but is back in atrial fibrillation today.  He was on ranolazine and Multaq, but now off Multaq as his insurance will not cover.   - Continue Xarelto.  - Long-term, I think he will either need ablation or an anti-arrhythmic medication to keep him in rhythm.  He can continue ranolazine, but it is a fairly weak anti-arrhythmic, and he is back in atrial fibrillation today. With  significant CHF, I would keep him off Multaq. He would probably not be a good candidate for Ic agents given cardiomyopathy with EF down to 45% in the past and 50% on most recent echo.  Tikosyn or sotalol would be options.  Would avoid amiodarone with significant lung dysfunction.  He was seen recently by Dr Graciela Husbands who thought Tikosyn would be reasonable for him.  Ultimate question will be whether or not he will ever be candidate for ablation.  His lung disease will make intubation/general anesthesia difficult.  For now, will plan to admit for Tikosyn.  ECG today is ok, will have to dose for renal dysfunction.  Check BMET/Mg today and will refer to atrial fibrillation clinic to arrange timing for  Tikosyn admission.  He will need to stop ranolazine prior to Tikosyn initiation due to risk of QT prolongation on the combination.  3. Chronic primarily diastolic CHF: Diastolic CHF with pulmonary venous hypertension on 12/17 echo.  NYHA class III symptoms (likely combination of heart and lungs as cause).  Most recent echo in 3/18 with EF 50% with relatively normal-appearing RV. On exam, he remains volume overloaded though weight down about 5 lbs since last appointment.  - Increase Lasix to 80 qam/40 qpm and increase KCl to 40 daily.  BMET today, repeat BMET/BNP in 10 days.  4. CKD: Stage III.  Follow carefully with diuresis. Has been stable.   Will refer to afib clinic to set up Tikosyn admission.   Marca Ancona 10/05/2016

## 2016-10-10 ENCOUNTER — Encounter: Payer: Self-pay | Admitting: Internal Medicine

## 2016-10-10 ENCOUNTER — Ambulatory Visit (INDEPENDENT_AMBULATORY_CARE_PROVIDER_SITE_OTHER): Payer: Medicaid Other | Admitting: Internal Medicine

## 2016-10-10 ENCOUNTER — Ambulatory Visit (INDEPENDENT_AMBULATORY_CARE_PROVIDER_SITE_OTHER)
Admission: RE | Admit: 2016-10-10 | Discharge: 2016-10-10 | Disposition: A | Discharge status: Home / Self Care | Payer: Medicaid Other | Source: Ambulatory Visit | Attending: Internal Medicine | Admitting: Internal Medicine

## 2016-10-10 DIAGNOSIS — J9611 Chronic respiratory failure with hypoxia: Secondary | ICD-10-CM | POA: Diagnosis not present

## 2016-10-10 DIAGNOSIS — J209 Acute bronchitis, unspecified: Secondary | ICD-10-CM | POA: Diagnosis not present

## 2016-10-10 MED ORDER — DOXYCYCLINE HYCLATE 100 MG PO TABS: ORAL_TABLET | ORAL | 0 refills | Status: DC

## 2016-10-10 NOTE — Progress Notes (Signed)
ATC pt, no VM available. WCB.  

## 2016-10-10 NOTE — Assessment & Plan Note (Addendum)
Take doxycycline 100mg  po twice daily x 5 days; take after meals and avoid sunlight cxr 10/10/2016 rule out pneumonia

## 2016-10-10 NOTE — Progress Notes (Signed)
Subjective:     Patient ID: Adam Benson, male   DOB: [birth year], 56 y.o.   MRN: [id number]  PCP O'Buch, Edgardo Roys, PA-C   HPI   56 yo male former patient of DR. 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 He had prolonged critical illness requiring tracheostomy. Suffered from Atrial Fib with RVR, shock, and c diff.   He had similar incidence in early 2016 with trach /decannulation .    04/13/2015 Post Hospital follow up  Pt returns for 1 month follow up .  Recently admitted for critical illness. He was admitted earlier this year for severe PNA w/ ARDS requiring prolonged vent support . He did have trach and was later decanulated. Tx in [place].  Presented acutely ill to [facility] in September , with PNA/ARDS requiring intubation on 02/01/15.   He had prolonged critical illness requiring tracheostomy. Suffered from Atrial Fib with RVR, shock, and c diff. He required discharged to IP rehab. Discharged home on 03/19/15.  He was decanulated on 03/16/15 . [name] site is healing well. No redness or fever.  He is Feeling much better.  Walking is doing much better, not having to use walker Remains on Oxygen 2l rest/At bedtime   and 5l/m act (pulse) .  Has been on oxygen since earlier admission in February this year.  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 On disability.  Says he is so much better. Less DOE but gets worn out easily and winded with prolonged walking.  Denies any chest tightness/congestion, sinus pressure, cough, fever, nausea or vomting.   says he had a sleep study  in [place] , dx with OSA . Suppose to see sleep doc but got admitted.  We requested copy of sleep study.   OV 05/15/2015  Chief Complaint  Patient presents with  . Follow-up     Former PW pt. Pt states his breathing is unchanged. Pt c/o mild cough with little mucus production with yellow mucus. Pt denies CP/tightness and f/c/s. Pt states he is now using CPAP nightly.     Follow-up post-ARDS related interstitial lung disease. Had ARDSJa 2016 through February 2016 admitted again September 2016 for 3 months with pneumonia and ARDS idiopathic. Autoimmune panel was negative.  No history of HIV check.he was on oxygen 2 L even after the first episode of ARDS.  Today's routine follow-up. Overall he continues to do well. He uses 2 L of oxygen. He says he desaturates without it. He does not have much hoarseness of voice. He says his physical deconditioning is slowly improving. However he is very frustrated by the fact his had recurrent ARDS. He is very upset that his ARDS record. He does not understand why. His autoimmune panel was negative. I do not see evidence of HIV check but he denies that he could have an HIV. He denies drug abuse. He denies acid reflux. His swallowing is okay. His respiratory virus panel was negative in the fall 2016.  Most recent blood work creatinine of 0.84 mg percent October 2016 and a hemoglobin of 10 g percent October 2016.  Walking desaturation test 185 feet 3 laps on room air: 1 lap 83%, HR 182.     OV 08/19/2015  Chief Complaint  Patient presents with  . Follow-up  Pt states when he ran out of prednisone his breathing worsened. Pt states his breathing is at baseline. Pt c/o non prod cough with chest congestion and chest tightness when active and SOB - resolves with rest.     Follow-up post-ARDS related interstitial lung disease and is obese man with sleep apnea on CPAP  Last seen December 2016. This a 3 month follow-up. Since his last visit in December 2016 he did have CT scan of the chest that showed significant improvement in his pulmonary infiltrates. However he does not feel this improvement. He says that without oxygen he desaturates into  the 70s. He has class III dyspnea on exertion. Oxygen helps and rest helps. He follows up with a local doctor for the sleep apnea. He seemed also locally by a cardiologist who changed his cardiac medications to multaq. This has not helped his dyspnea. In the interim he did run out of daily prednisone and he became more short of breath but he does not know he desaturated when he ran out of prednisone.  He is extremely worried about the etiology and uncertainty with his disease. He is worried about the risk of recurrent ARDS. He also feels that he should not be on daily prednisone. He is open to the idea for second opinion.   OV 12/17/2015  Chief Complaint  Patient presents with  . Follow-up    [facility] has him on 9 liters O2. reports breathing is getting worse, wheezing, chest tightness, prod cough (cream color phlem).    Follow-up post-ARDS related interstitial lung disease. Obese man with sleep apnea on CPAP  Last visit in 08/19/2015. Chest x-ray at that time showed persistence of infiltrates. I referred him to [facility]. He saw Dr. Glynda Jaeger 11/30/2015. I personally reviewed the note. Dr. Glynda Jaeger is cutting down the prednisone 5 mg daily. There is no change in his dyspnea baseline cough. The main issues that he continues to have rapidly fluctuating weight issues. He is on diuresis. He has seen his cardiologist Dr. Sampson Goon at [facility]. Dr. Glynda Jaeger at [facility] is recommending right heart catheterization the patient seems to forgotten about this. According to the patient is no active appointment pending for right heart catheterization but he is open to having this procedure. He understands that weight loss is the key to long-term health    OV 04/04/2016  Chief Complaint  Patient presents with  . Follow-up    Breathing is not doing good today. Breathing is only good sitting down for a long period of time. Winded using the bathroom. SOB w/ excertion causing  tightness and congestion. Cough/ w greyish brown mucus.      Follow-up post-ARDS related interstitial lung disease. Obese man with sleep apnea on CPAP   Last vist July 2017. This visit was supposed to be post right heart cath that ws recommeneded at [facility] given his significant daily weight changes. [facility] and DR Fitzgeral preferred be done locally. Chart review indicates my office could not get hold of him to set up referral to Dr McLean/Bensimohn. He says he never got a call from  [place]. In any event, overall same. USes 7L o2. Still with daily weight changes. Says Dr Randel Pigg his cardiologist is going to try cardioverting his Atrial Fibrillation for 4th time. He states he is pessmistic about its success . No other acute or chronic symptoms. On lower daily prednisone 5mg  per day; seeoms to be keeping lung stable. Last CT chest was  1 year  ago   OV 07/04/2016  Chief Complaint  Patient presents with  . Follow-up    ILD, still remains on 7 liters with exertion, breathing is only good when is sitting down, exertion really causes him to get low in O2, he still coughs at night which effects his sleep at night    Follow-up post-ARDS related interstitial lung disease. Obese man with sleep apnea on CPAP   Follow-up overall stable. He is up-to-date with his flu shot. Uses 7 L of oxygen. He recently had his right heart catheterization December 2017 that shows pulmonary venous hypertension with elevated wedge pressures. He says that his cardiologist in [place] has tried different diuretics but is always resistant. He is very pessimistic about him getting better. He is open to getting a second opinion from a cardiologist here in [place] with diuresis management. In terms of his atrial fibrillation he is now in sinus rhythm after cardioversion  RHC - copied and pasted as below  Right Heart Pressures RHC Procedural Findings: Hemodynamics (mmHg) RA mean 12 RV 42/18 PA 44/24, mean 33 PCWP mean  22  Oxygen saturations: PA 69% AO 97%  Cardiac Output (Fick) 8.5  Cardiac Index (Fick) 3.04 PVR 1.3 WU  Conclusion   1. Elevated left and right filling pressure.  2. Pulmonary venous hypertension.      OV 10/10/2016  Chief Complaint  Patient presents with  . Follow-up    Pt wearing 7lpm pulsed. Pt c/o increase in prod cough with yellow mucus 2 weeks. Pt denies SOB. Pt states he has chest tightness when active but this resolves with rest.    Follow-up ARDS survivor. His obesity with sleep apnea and right heart catheterization  Early 2018 showed pulmonary venous hypertension   Since his last visit he spent substantial amount of time with our cardiologist. Lorri Frederick try to diurese him. They've tried cardioversion. They've tried different antiarrhythmic drugs for his atrial fibrillation all without success. Currently overall he feels stable on 7 L oxygen need was stable dyspnea on exertion and chest pain but he didn't desaturate walking in to our office despite being on several liters of oxygen. He does have complaints about yellow and green sputum for the last few weeks but without fever or worsening shortness of breath. There is no associated wheezing. He does have sleep apnea and uses CPAP at night. But he tells me that he does not have a sleep specialist overseeing his sleep apnea. He's had previous tracheostomy but is not sure if he would be in favor of permanent tracheostomy but he is open to the idea hearing more about it.   IMPRESSION: compared to CT dec 2016 1. No appreciable change in fibrotic interstitial lung disease characterized by extensive patchy ground-glass attenuation, subpleural reticulation, mild parenchymal distortion, mild traction bronchiectasis and mild patchy air trapping throughout both lungs without a clear basilar gradient. No frank honeycombing. Differential includes fibrotic nonspecific interstitial pneumonia (NSIP) or chronic hypersensitivity  pneumonitis. Findings are not consistent with usual interstitial pneumonia (UIP). 2. Aortic atherosclerosis. Stable 4.6 cm ascending thoracic aortic aneurysm. Ascending thoracic aortic aneurysm. Recommend semi-annual imaging followup by CTA or MRA and referral to cardiothoracic surgery if not already obtained. This recommendation follows 2010 ACCF/AHA/AATS/ACR/ASA/SCA/SCAI/SIR/STS/SVM Guidelines for the Diagnosis and Management of Patients With Thoracic Aortic Disease. Circulation. 2010; 121: Z610-R604. 3. Stable dilated main pulmonary artery, suggesting chronic pulmonary arterial hypertension. 4. Stable mild cardiomegaly.  One vessel coronary atherosclerosis. 5. Stable mild mediastinal lymphadenopathy, compatible with benign reactive adenopathy.  Electronically Signed   By: Delbert Phenix M.D.   On: 07/12/2016 16:51   has a past medical history of Chronic atrial fibrillation (HCC); GERD (gastroesophageal reflux disease); Gout; History of pneumonia (05/2014 ); History of tracheostomy (06/2014>>out 07/2014); HTN (hypertension), benign; Hyperlipemia; Insomnia; Nerve damage; Obesity; Peptic ulcer (04/2014); and Pneumonia.   reports that he has never smoked. His smokeless tobacco use includes Chew.  Past Surgical History:  Procedure Laterality Date  . APPENDECTOMY    . arm surgery     right  . CARDIAC CATHETERIZATION N/A 04/29/2016   Procedure: Right Heart Cath;  Surgeon: Laurey Morale, MD;  Location: [facility] INVASIVE CV LAB;  Service: Cardiovascular;  Laterality: N/A;  . CARDIOVERSION N/A 08/26/2016   Procedure: CARDIOVERSION;  Surgeon: Laurey Morale, MD;  Location: [facility] ENDOSCOPY;  Service: Cardiovascular;  Laterality: N/A;  . KNEE SURGERY     right    Allergies  Allergen Reactions  . Penicillins Hives and Shortness Of Breath    Has patient had a PCN reaction causing immediate rash, facial/tongue/throat swelling, SOB or lightheadedness with hypotension: UNKNOWN Has patient had a PCN  reaction causing severe rash involving mucus membranes or skin necrosis: No Has patient had a PCN reaction that required hospitalization No Has patient had a PCN reaction occurring within the last 10 years: No If all of the above answers are "NO", then may proceed with Cephalosporin use.   Marland Kitchen Hydrochlorothiazide Other (See Comments)    hypercalemia    Immunization History  Administered Date(s) Administered  . Influenza Split 04/22/2014, 02/28/2016  . Influenza,inj,Quad PF,36+ Mos 02/26/2015  . Pneumococcal Conjugate-13 04/22/2014    Family History  Problem Relation Age of Onset  . Heart disease Mother   . Diabetes Mother   . Hypertension Mother      Current Outpatient Prescriptions:  .  famotidine (PEPCID) 20 MG tablet, Take 1 tablet (20 mg total) by mouth 2 (two) times daily., Disp: 60 tablet, Rfl: 1 .  Febuxostat (ULORIC) 80 MG TABS, Take 1 tablet by mouth daily., Disp: , Rfl:  .  fenofibrate (TRICOR) 145 MG tablet, Take 1 tablet (145 mg total) by mouth daily., Disp: 30 tablet, Rfl: 1 .  furosemide (LASIX) 80 MG tablet, Take 1 tab in AM and 1/2 tab in PM, Disp: 45 tablet, Rfl: 6 .  HYDROcodone-acetaminophen (NORCO) 10-325 MG tablet, Take 1 tablet by mouth 4 (four) times daily., Disp: , Rfl:  .  metoprolol tartrate (LOPRESSOR) 25 MG tablet, Take 25 mg by mouth 2 (two) times daily., Disp: , Rfl:  .  potassium chloride SA (K-DUR,KLOR-CON) 20 MEQ tablet, Take 2 tablets (40 mEq total) by mouth daily., Disp: 60 tablet, Rfl: 3 .  predniSONE (DELTASONE) 10 MG tablet, Take 0.5 tablets (5 mg total) by mouth daily with breakfast., Disp: 30 tablet, Rfl: 2 .  pregabalin (LYRICA) 200 MG capsule, Take 1 capsule (200 mg total) by mouth 3 (three) times daily. Take 1 capsule three times a day., Disp: 90 capsule, Rfl: 0 .  ranolazine (RANEXA) 1000 MG SR tablet, Take 1,000 mg by mouth 2 (two) times daily., Disp: , Rfl:  .  rivaroxaban (XARELTO) 20 MG TABS tablet, Take 20 mg by mouth daily with  supper., Disp: , Rfl:  .  spironolactone (ALDACTONE) 25 MG tablet, Take 50 mg by mouth daily., Disp: , Rfl:  .  testosterone cypionate (DEPOTESTOSTERONE CYPIONATE) 200 MG/ML injection, Inject 200 mg into the muscle every 7 (seven) days. Wednesdays, Disp: , Rfl:  Review of Systems     Objective:   Physical Exam  Constitutional: He is oriented to person, place, and time. He appears well-developed and well-nourished. No distress.  obese  HENT:  Head: Normocephalic and atraumatic.  Right Ear: External ear normal.  Left Ear: External ear normal.  Mouth/Throat: Oropharynx is clear and moist. No oropharyngeal exudate.  Eyes: Conjunctivae and EOM are normal. Pupils are equal, round, and reactive to light. Right eye exhibits no discharge. Left eye exhibits no discharge. No scleral icterus.  Neck: Normal range of motion. Neck supple. No JVD present. No tracheal deviation present. No thyromegaly present.  Cardiovascular: Normal rate, regular rhythm and intact distal pulses.  Exam reveals no gallop and no friction rub.   No murmur heard. Pulmonary/Chest: Effort normal and breath sounds normal. No respiratory distress. He has no wheezes. He has no rales. He exhibits no tenderness.  Abdominal: Soft. Bowel sounds are normal. He exhibits no distension and no mass. There is no tenderness. There is no rebound and no guarding.  Musculoskeletal: Normal range of motion. He exhibits no edema or tenderness.  Lymphadenopathy:    He has no cervical adenopathy.  Neurological: He is alert and oriented to person, place, and time. He has normal reflexes. No cranial nerve deficit. Coordination normal.  Skin: Skin is warm and dry. No rash noted. He is not diaphoretic. No erythema. No pallor.  Psychiatric: He has a normal mood and affect. His behavior is normal. Judgment and thought content normal.  Nursing note and vitals reviewed.  Vitals:   10/10/16 0908  BP: (!) 146/86  Pulse: 61  SpO2: 98%  Weight: (!)  345 lb (156.5 kg)  Height: 6' (1.829 m)    Estimated body mass index is 46.79 kg/m as calculated from the following:   Height as of this encounter: 6' (1.829 m).   Weight as of this encounter: 345 lb (156.5 kg).     Assessment:       ICD-9-CM ICD-10-CM   1. Acute bronchitis, unspecified organism 466.0 J20.9 DG Chest 2 View  2. Chronic respiratory failure with hypoxia (HCC) 518.83 J96.11 DG Chest 2 View   799.02         Plan:     Acute bronchitis Take doxycycline 100mg  po twice daily x 5 days; take after meals and avoid sunlight cxr 10/10/2016 rule out pneumonia   Chronic respiratory failure with hypoxia Stable other than mild acute bronchhitis but having severe o2 needs and uncontrolled diastolic hear failure and difficult to control A Fib  Plan continu o2 as before A Fib and heart failure management per cards Refer sleep doc in our office - to see if we can optimize CPAP Rx and help your heart Please discuss "curative" tracheostomy with sleep doc as a long term future option  Followup Refer sleep doc in our office ROV with Dr [name] in 4 months or sooner if needed  > 50% of this > 25 min visit spent in face to face counseling or coordination of care    Dr. Kalman Shan, M.D., [facility].C.P Pulmonary and Critical Care Medicine Staff Physician [facility] [place] [facility] Pager: [phone], If no answer or between  15:00h - 7:00h: call [phone]  10/10/2016 9:30 AM

## 2016-10-10 NOTE — Assessment & Plan Note (Signed)
Stable other than mild acute bronchhitis but having severe o2 needs and uncontrolled diastolic hear failure and difficult to control A Fib  Plan continu o2 as before A Fib and heart failure management per cards Refer sleep doc in our office - to see if we can optimize CPAP Rx and help your heart Please discuss "curative" tracheostomy with sleep doc as a long term future option  Followup Refer sleep doc in our office ROV with Dr [name] in 4 months or sooner if needed

## 2016-10-10 NOTE — Patient Instructions (Signed)
Acute bronchitis Take doxycycline 100mg  po twice daily x 5 days; take after meals and avoid sunlight cxr 10/10/2016 rule out pneumonia   Chronic respiratory failure with hypoxia Stable other than mild acute bronchhitis but having severe o2 needs and uncontrolled diastolic hear failure and difficult to control A Fib  Plan continu o2 as before A Fib and heart failure management per cards Refer sleep doc in our office - to see if we can optimize CPAP Rx and help your heart Please discuss "curative" tracheostomy with sleep doc as a long term future option  Followup Refer sleep doc in our office ROV with Dr [name] in 4 months or sooner if needed

## 2016-10-12 NOTE — Progress Notes (Signed)
ATC pt, no VM. WCB.

## 2016-10-13 ENCOUNTER — Ambulatory Visit (INDEPENDENT_AMBULATORY_CARE_PROVIDER_SITE_OTHER): Payer: Medicaid Other | Admitting: Pulmonary Disease

## 2016-10-13 ENCOUNTER — Encounter: Payer: Self-pay | Admitting: Pulmonary Disease

## 2016-10-13 VITALS — BP 108/72 | HR 83 | Ht 72.0 in | Wt 344.8 lb

## 2016-10-13 DIAGNOSIS — J849 Interstitial pulmonary disease, unspecified: Secondary | ICD-10-CM

## 2016-10-13 DIAGNOSIS — Z6841 Body Mass Index (BMI) 40.0 and over, adult: Secondary | ICD-10-CM

## 2016-10-13 DIAGNOSIS — J9611 Chronic respiratory failure with hypoxia: Secondary | ICD-10-CM

## 2016-10-13 DIAGNOSIS — G4733 Obstructive sleep apnea (adult) (pediatric): Secondary | ICD-10-CM

## 2016-10-13 NOTE — Patient Instructions (Signed)
Will arrange for overnight oxygen test with you using CPAP and 5 liters oxygen  Will get copy of CPAP report  Follow up in 6 months

## 2016-10-13 NOTE — Progress Notes (Signed)
Past Surgical History He  has a past surgical history that includes arm surgery; Appendectomy; Knee surgery; Cardiac catheterization (N/A, 04/29/2016); and Cardioversion (N/A, 08/26/2016).  Allergies  Allergen Reactions  . Penicillins Hives and Shortness Of Breath    Has patient had a PCN reaction causing immediate rash, facial/tongue/throat swelling, SOB or lightheadedness with hypotension: UNKNOWN Has patient had a PCN reaction causing severe rash involving mucus membranes or skin necrosis: No Has patient had a PCN reaction that required hospitalization No Has patient had a PCN reaction occurring within the last 10 years: No If all of the above answers are "NO", then may proceed with Cephalosporin use.   Marland Kitchen Hydrochlorothiazide Other (See Comments)    hypercalemia    Family History His family history includes Diabetes in his mother; Heart disease in his mother; Hypertension in his mother.  Social History He  reports that he has never smoked. His smokeless tobacco use includes Chew. He reports that he does not drink alcohol or use drugs.  Review of systems Constitutional: Positive for unexpected weight change. Negative for fever.  HENT: Negative for congestion, dental problem, ear pain, nosebleeds, postnasal drip, rhinorrhea, sinus pressure, sneezing, sore throat and trouble swallowing.   Eyes: Negative for redness and itching.  Respiratory: Positive for cough and shortness of breath. Negative for chest tightness and wheezing.   Cardiovascular: Positive for palpitations. Negative for leg swelling.  Gastrointestinal: Negative for nausea and vomiting.  Genitourinary: Negative for dysuria.  Musculoskeletal: Positive for arthralgias and joint swelling.  Skin: Negative for rash.  Neurological: Negative for headaches.  Hematological: Does not bruise/bleed easily.  Psychiatric/Behavioral: Negative for dysphoric mood. The patient is not nervous/anxious.     Current Outpatient Prescriptions  on File Prior to Visit  Medication Sig  . doxycycline (VIBRA-TABS) 100 MG tablet Take 1 tablet, twice daily for 5 days. Avoid sunlight and take after meals.  . famotidine (PEPCID) 20 MG tablet Take 1 tablet (20 mg total) by mouth 2 (two) times daily.  . Febuxostat (ULORIC) 80 MG TABS Take 1 tablet by mouth daily.  . fenofibrate (TRICOR) 145 MG tablet Take 1 tablet (145 mg total) by mouth daily.  . furosemide (LASIX) 80 MG tablet Take 1 tab in AM and 1/2 tab in PM  . HYDROcodone-acetaminophen (NORCO) 10-325 MG tablet Take 1 tablet by mouth 4 (four) times daily.  . metoprolol tartrate (LOPRESSOR) 25 MG tablet Take 25 mg by mouth 2 (two) times daily.  . potassium chloride SA (K-DUR,KLOR-CON) 20 MEQ tablet Take 2 tablets (40 mEq total) by mouth daily.  . predniSONE (DELTASONE) 10 MG tablet Take 0.5 tablets (5 mg total) by mouth daily with breakfast.  . pregabalin (LYRICA) 200 MG capsule Take 1 capsule (200 mg total) by mouth 3 (three) times daily. Take 1 capsule three times a day.  . ranolazine (RANEXA) 1000 MG SR tablet Take 1,000 mg by mouth 2 (two) times daily.  . rivaroxaban (XARELTO) 20 MG TABS tablet Take 20 mg by mouth daily with supper.  Marland Kitchen spironolactone (ALDACTONE) 25 MG tablet Take 50 mg by mouth daily.  Marland Kitchen testosterone cypionate (DEPOTESTOSTERONE CYPIONATE) 200 MG/ML injection Inject 200 mg into the muscle every 7 (seven) days. Wednesdays   No current facility-administered medications on file prior to visit.     Chief Complaint  Patient presents with  . SLEEP CONSULT    Referred by Dr [name] for OSA. Sleep Study 2016. Current CPAP 20 with 5 liters O2. DME: [facility]    Sleep tests  PSG 12/05/14 >> AHI 112.9, SaO2 51%  Pulmonary tests PFT 10/28/14 >> FEV1 3.03 (73%), FEV1% 93, TLCH 4.76 (64%), DLCO 40%, no BD ABG 02/23/15 >> pH 7.44, PCO2 55.6, PO2 67.6 Serology 03/11/15 >> RF negative, anti CCP 13, SSA/SSB negative, SCL 70 negative, ANCA negative, anti DS DNA negative, Aldolase  8.7 HCRT chest 07/12/16 >> 4.6 cm ascending thoracic aorta, patchy air trapping, mild traction BTX, extensive patchy GGO  Cardiac tests RHC 04/29/16 >> RA 42/18, mean 12, PA 44/24, mean 33, PCWP 22, CI 3.04, PVR 1.3 Echo 08/26/16 >> EF 50%  Past medical history He  has a past medical history of Chronic atrial fibrillation (HCC); GERD (gastroesophageal reflux disease); Gout; History of pneumonia (05/2014 ); History of tracheostomy (06/2014>>out 07/2014); HTN (hypertension), benign; Hyperlipemia; Insomnia; Nerve damage; Obesity; Peptic ulcer (04/2014); and Pneumonia.  Vital signs BP 108/72 (BP Location: Left Wrist, Cuff Size: Normal)   Pulse 83   Ht 6' (1.829 m)   Wt (!) 344 lb 12.8 oz (156.4 kg)   SpO2 96%   BMI 46.76 kg/m   History of Present Illness Adam Benson is a 56 y.o. male for evaluation of sleep problems.  He is followed by Dr. [name] for ILD.  He had sleep study in 2016 which showed very severe sleep apnea.  He has been on CPAP since.  He uses 5 liters oxygen 24/7.  He is not sure what his CPAP setting is.  He has full face mask.  He gets about 8 hrs sleep.  He wakes up twice to use bathroom.  He doesn't have trouble falling asleep during the day.  He was started on Abx 3 days ago for bronchitis.  He is feeling better.  Prior to this he was not having cough, wheeze, sputum, fever, or hemoptysis.  His leg swelling is better.  He is followed by cardiology for a flutter and is being set up for intervention.  The Epworth score is 3 out of 24.   Physical Exam:  General - No distress, wearing oxygen Eyes - pupils reactive ENT - No sinus tenderness, no oral exudate, no LAN, no thyromegaly, TM clear, pupils equal/reactive, MP 3 Cardiac - s1s2 regular, no murmur, pulses symmetric Chest - No wheeze/rales/dullness, good air entry, normal respiratory excursion Back - No focal tenderness Abd - Soft, non-tender, no organomegaly, + bowel sounds Ext - No edema Neuro - Normal  strength, cranial nerves intact Skin - No rashes Psych - Normal mood, and behavior  Discussion: 56 yo with hx of severe OSA.  He reports compliance and benefit.  Need to determine if he is on optimal settings.    He also is morbidly obese and has hx of ILD.  He has chronic hypoxic and hypercapnic respiratory failure on the basis of these.  Assessment/plan:  Obstructive sleep apnea. - will get copy of his CPAP download  - depending on findings will determine if he needs in lab titration study  Chronic respiratory failure with hypoxia and hypercapnia. - continue 5 liters oxygen with CPAP at night for now  Interstitial lung disease. - not clear what the cause of this is - he is to follow with Dr. [name]  Morbid obesity. - discussed importance of weight loss   Patient Instructions  Will arrange for overnight oxygen test with you using CPAP and 5 liters oxygen  Will get copy of CPAP report  Follow up in 6 months    Coralyn Helling, M.D. Pager [phone] 10/13/2016, 10:11 AM

## 2016-10-13 NOTE — Progress Notes (Signed)
   Subjective:    Patient ID: Adam Benson, male    DOB: Aug 19, 1960, 56 y.o.   MRN: [id number]  HPI    Review of Systems  Constitutional: Positive for unexpected weight change. Negative for fever.  HENT: Negative for congestion, dental problem, ear pain, nosebleeds, postnasal drip, rhinorrhea, sinus pressure, sneezing, sore throat and trouble swallowing.   Eyes: Negative for redness and itching.  Respiratory: Positive for cough and shortness of breath. Negative for chest tightness and wheezing.   Cardiovascular: Positive for palpitations. Negative for leg swelling.  Gastrointestinal: Negative for nausea and vomiting.  Genitourinary: Negative for dysuria.  Musculoskeletal: Positive for arthralgias and joint swelling.  Skin: Negative for rash.  Neurological: Negative for headaches.  Hematological: Does not bruise/bleed easily.  Psychiatric/Behavioral: Negative for dysphoric mood. The patient is not nervous/anxious.        Objective:   Physical Exam        Assessment & Plan:

## 2016-10-14 ENCOUNTER — Encounter: Payer: Self-pay | Admitting: Emergency Medicine

## 2016-10-14 NOTE — Progress Notes (Signed)
ATC pt. No VM. Letter sent. Will sign off.

## 2016-10-17 ENCOUNTER — Other Ambulatory Visit: Payer: Self-pay

## 2016-10-17 ENCOUNTER — Inpatient Hospital Stay (HOSPITAL_COMMUNITY)
Admission: AD | Admit: 2016-10-17 | Discharge: 2016-10-20 | DRG: 309 | Disposition: A | Discharge status: Home / Self Care | Payer: Medicaid Other | Source: Ambulatory Visit | Attending: Internal Medicine | Admitting: Internal Medicine

## 2016-10-17 ENCOUNTER — Ambulatory Visit (HOSPITAL_COMMUNITY)
Admission: RE | Admit: 2016-10-17 | Discharge: 2016-10-17 | Disposition: A | Discharge status: Home / Self Care | Payer: Medicaid Other | Source: Ambulatory Visit | Attending: Cardiology | Admitting: Cardiology

## 2016-10-17 ENCOUNTER — Encounter (HOSPITAL_COMMUNITY): Payer: Self-pay | Admitting: Nurse Practitioner

## 2016-10-17 ENCOUNTER — Ambulatory Visit (HOSPITAL_COMMUNITY)
Admission: RE | Admit: 2016-10-17 | Discharge: 2016-10-17 | Disposition: A | Discharge status: Home / Self Care | Payer: Medicaid Other | Source: Ambulatory Visit | Attending: Nurse Practitioner | Admitting: Nurse Practitioner

## 2016-10-17 VITALS — BP 118/74 | HR 73 | Ht 72.0 in | Wt 339.0 lb

## 2016-10-17 DIAGNOSIS — Z9981 Dependence on supplemental oxygen: Secondary | ICD-10-CM | POA: Diagnosis not present

## 2016-10-17 DIAGNOSIS — I481 Persistent atrial fibrillation: Principal | ICD-10-CM | POA: Diagnosis present

## 2016-10-17 DIAGNOSIS — N183 Chronic kidney disease, stage 3 (moderate): Secondary | ICD-10-CM | POA: Diagnosis present

## 2016-10-17 DIAGNOSIS — Z8701 Personal history of pneumonia (recurrent): Secondary | ICD-10-CM | POA: Diagnosis not present

## 2016-10-17 DIAGNOSIS — I130 Hypertensive heart and chronic kidney disease with heart failure and stage 1 through stage 4 chronic kidney disease, or unspecified chronic kidney disease: Secondary | ICD-10-CM | POA: Diagnosis present

## 2016-10-17 DIAGNOSIS — Z8249 Family history of ischemic heart disease and other diseases of the circulatory system: Secondary | ICD-10-CM | POA: Diagnosis not present

## 2016-10-17 DIAGNOSIS — Z6841 Body Mass Index (BMI) 40.0 and over, adult: Secondary | ICD-10-CM | POA: Diagnosis not present

## 2016-10-17 DIAGNOSIS — Z7901 Long term (current) use of anticoagulants: Secondary | ICD-10-CM

## 2016-10-17 DIAGNOSIS — J9611 Chronic respiratory failure with hypoxia: Secondary | ICD-10-CM | POA: Diagnosis not present

## 2016-10-17 DIAGNOSIS — K219 Gastro-esophageal reflux disease without esophagitis: Secondary | ICD-10-CM | POA: Diagnosis present

## 2016-10-17 DIAGNOSIS — Z87442 Personal history of urinary calculi: Secondary | ICD-10-CM

## 2016-10-17 DIAGNOSIS — Z79891 Long term (current) use of opiate analgesic: Secondary | ICD-10-CM

## 2016-10-17 DIAGNOSIS — J8410 Pulmonary fibrosis, unspecified: Secondary | ICD-10-CM | POA: Diagnosis present

## 2016-10-17 DIAGNOSIS — E785 Hyperlipidemia, unspecified: Secondary | ICD-10-CM | POA: Diagnosis present

## 2016-10-17 DIAGNOSIS — G4733 Obstructive sleep apnea (adult) (pediatric): Secondary | ICD-10-CM | POA: Diagnosis present

## 2016-10-17 DIAGNOSIS — I4819 Other persistent atrial fibrillation: Secondary | ICD-10-CM | POA: Diagnosis present

## 2016-10-17 DIAGNOSIS — Z8711 Personal history of peptic ulcer disease: Secondary | ICD-10-CM

## 2016-10-17 DIAGNOSIS — Z930 Tracheostomy status: Secondary | ICD-10-CM

## 2016-10-17 DIAGNOSIS — I5032 Chronic diastolic (congestive) heart failure: Secondary | ICD-10-CM | POA: Diagnosis present

## 2016-10-17 DIAGNOSIS — I482 Chronic atrial fibrillation, unspecified: Secondary | ICD-10-CM

## 2016-10-17 DIAGNOSIS — Z888 Allergy status to other drugs, medicaments and biological substances status: Secondary | ICD-10-CM | POA: Diagnosis not present

## 2016-10-17 DIAGNOSIS — Z79899 Other long term (current) drug therapy: Secondary | ICD-10-CM

## 2016-10-17 DIAGNOSIS — Z833 Family history of diabetes mellitus: Secondary | ICD-10-CM | POA: Diagnosis not present

## 2016-10-17 DIAGNOSIS — Z7952 Long term (current) use of systemic steroids: Secondary | ICD-10-CM

## 2016-10-17 DIAGNOSIS — Z880 Allergy status to penicillin: Secondary | ICD-10-CM

## 2016-10-17 HISTORY — DX: Dependence on supplemental oxygen: Z99.81

## 2016-10-17 HISTORY — DX: Personal history of peptic ulcer disease: Z87.11

## 2016-10-17 HISTORY — DX: Dependence on other enabling machines and devices: Z99.89

## 2016-10-17 HISTORY — DX: Obstructive sleep apnea (adult) (pediatric): G47.33

## 2016-10-17 HISTORY — DX: Personal history of other medical treatment: Z92.89

## 2016-10-17 HISTORY — DX: Personal history of urinary calculi: Z87.442

## 2016-10-17 HISTORY — DX: Other persistent atrial fibrillation: I48.19

## 2016-10-17 HISTORY — DX: Acute respiratory distress syndrome: J80

## 2016-10-17 LAB — BASIC METABOLIC PANEL
Anion gap: 12 (ref 5–15)
Anion gap: 9 (ref 5–15)
Anion gap: 10 (ref 5–15)
BUN: 22 mg/dL — AB (ref 6–20)
BUN: 23 mg/dL — AB (ref 6–20)
BUN: 22 mg/dL — ABNORMAL HIGH (ref 6–20)
CALCIUM: 8.9 mg/dL (ref 8.9–10.3)
CHLORIDE: 93 mmol/L — AB (ref 101–111)
CHLORIDE: 96 mmol/L — AB (ref 101–111)
CO2: 31 mmol/L (ref 22–32)
CO2: 31 mmol/L (ref 22–32)
CO2: 31 mmol/L (ref 22–32)
CREATININE: 2.05 mg/dL — AB (ref 0.61–1.24)
CREATININE: 2.11 mg/dL — AB (ref 0.61–1.24)
Calcium: 8.7 mg/dL — ABNORMAL LOW (ref 8.9–10.3)
Calcium: 8.9 mg/dL (ref 8.9–10.3)
Chloride: 96 mmol/L — ABNORMAL LOW (ref 101–111)
Creatinine, Ser: 2.21 mg/dL — ABNORMAL HIGH (ref 0.61–1.24)
GFR calc Af Amer: 41 mL/min — ABNORMAL LOW (ref >60)
GFR calc Af Amer: 37 mL/min — ABNORMAL LOW (ref >60)
GFR calc non Af Amer: 35 mL/min — ABNORMAL LOW (ref >60)
GFR calc non Af Amer: 32 mL/min — ABNORMAL LOW (ref >60)
GFR, EST AFRICAN AMERICAN: 40 mL/min — AB (ref >60)
GFR, EST NON AFRICAN AMERICAN: 34 mL/min — AB (ref >60)
GLUCOSE: 133 mg/dL — AB (ref 65–99)
Glucose, Bld: 100 mg/dL — ABNORMAL HIGH (ref 65–99)
Glucose, Bld: 90 mg/dL (ref 65–99)
POTASSIUM: 3.9 mmol/L (ref 3.5–5.1)
Potassium: 3.5 mmol/L (ref 3.5–5.1)
Potassium: 3.9 mmol/L (ref 3.5–5.1)
SODIUM: 136 mmol/L (ref 135–145)
SODIUM: 137 mmol/L (ref 135–145)
Sodium: 136 mmol/L (ref 135–145)

## 2016-10-17 LAB — MAGNESIUM: Magnesium: 2 mg/dL (ref 1.7–2.4)

## 2016-10-17 MED ORDER — SODIUM CHLORIDE 0.9% FLUSH: 3.0000 mL | INTRAVENOUS | Status: DC | PRN

## 2016-10-17 MED ORDER — METOPROLOL TARTRATE 25 MG PO TABS
25.0000 mg | ORAL_TABLET | Freq: Two times a day (BID) | ORAL | Status: DC
  Administered 2016-10-17: 25 mg via ORAL
  Filled 2016-10-17 (×2): qty 1

## 2016-10-17 MED ORDER — DOFETILIDE 250 MCG PO CAPS: 250.0000 ug | ORAL_CAPSULE | Freq: Two times a day (BID) | ORAL | Status: DC

## 2016-10-17 MED ORDER — DOFETILIDE 250 MCG PO CAPS
250.0000 ug | ORAL_CAPSULE | Freq: Two times a day (BID) | ORAL | Status: DC
  Administered 2016-10-17: 250 ug via ORAL
  Filled 2016-10-17: qty 1

## 2016-10-17 MED ORDER — RIVAROXABAN 20 MG PO TABS
20.0000 mg | ORAL_TABLET | Freq: Every day | ORAL | Status: DC
  Administered 2016-10-18 – 2016-10-20 (×3): 20 mg via ORAL
  Filled 2016-10-17 (×3): qty 1

## 2016-10-17 MED ORDER — PREGABALIN 50 MG PO CAPS
200.0000 mg | ORAL_CAPSULE | Freq: Three times a day (TID) | ORAL | Status: DC
  Administered 2016-10-17 – 2016-10-20 (×9): 200 mg via ORAL
  Filled 2016-10-17 (×8): qty 4
  Filled 2016-10-17: qty 8

## 2016-10-17 MED ORDER — SODIUM CHLORIDE 0.9 % IV SOLN: 250.0000 mL | INTRAVENOUS | Status: DC | PRN

## 2016-10-17 MED ORDER — POTASSIUM CHLORIDE CRYS ER 20 MEQ PO TBCR
60.0000 meq | EXTENDED_RELEASE_TABLET | Freq: Once | ORAL | Status: AC
  Administered 2016-10-17: 60 meq via ORAL
  Filled 2016-10-17: qty 3

## 2016-10-17 MED ORDER — RANOLAZINE ER 500 MG PO TB12
1000.0000 mg | ORAL_TABLET | Freq: Two times a day (BID) | ORAL | Status: DC
  Administered 2016-10-17 – 2016-10-20 (×6): 1000 mg via ORAL
  Filled 2016-10-17 (×6): qty 2

## 2016-10-17 MED ORDER — POTASSIUM CHLORIDE CRYS ER 20 MEQ PO TBCR: 40.0000 meq | EXTENDED_RELEASE_TABLET | Freq: Once | ORAL | Status: DC

## 2016-10-17 MED ORDER — POTASSIUM CHLORIDE CRYS ER 20 MEQ PO TBCR
20.0000 meq | EXTENDED_RELEASE_TABLET | Freq: Once | ORAL | Status: AC
  Administered 2016-10-17: 20 meq via ORAL
  Filled 2016-10-17: qty 1

## 2016-10-17 MED ORDER — FUROSEMIDE 80 MG PO TABS
80.0000 mg | ORAL_TABLET | Freq: Every day | ORAL | Status: DC
  Administered 2016-10-17 – 2016-10-20 (×4): 80 mg via ORAL
  Filled 2016-10-17 (×4): qty 1

## 2016-10-17 MED ORDER — POTASSIUM CHLORIDE CRYS ER 20 MEQ PO TBCR
40.0000 meq | EXTENDED_RELEASE_TABLET | Freq: Every day | ORAL | Status: DC
  Administered 2016-10-18 – 2016-10-20 (×3): 40 meq via ORAL
  Filled 2016-10-17 (×3): qty 2

## 2016-10-17 MED ORDER — PREDNISONE 5 MG PO TABS
5.0000 mg | ORAL_TABLET | Freq: Every day | ORAL | Status: DC
  Administered 2016-10-18 – 2016-10-20 (×3): 5 mg via ORAL
  Filled 2016-10-17 (×3): qty 1

## 2016-10-17 MED ORDER — SODIUM CHLORIDE 0.9% FLUSH
3.0000 mL | Freq: Two times a day (BID) | INTRAVENOUS | Status: DC
  Administered 2016-10-17 – 2016-10-19 (×4): 3 mL via INTRAVENOUS

## 2016-10-17 MED ORDER — HYDROCODONE-ACETAMINOPHEN 10-325 MG PO TABS
1.0000 | ORAL_TABLET | Freq: Four times a day (QID) | ORAL | Status: DC
  Administered 2016-10-17 – 2016-10-20 (×11): 1 via ORAL
  Filled 2016-10-17 (×11): qty 1

## 2016-10-17 MED ORDER — FEBUXOSTAT 40 MG PO TABS
80.0000 mg | ORAL_TABLET | Freq: Every day | ORAL | Status: DC
  Administered 2016-10-18 – 2016-10-20 (×3): 80 mg via ORAL
  Filled 2016-10-17 (×3): qty 2

## 2016-10-17 MED ORDER — FAMOTIDINE 20 MG PO TABS
20.0000 mg | ORAL_TABLET | Freq: Two times a day (BID) | ORAL | Status: DC
  Administered 2016-10-17 – 2016-10-20 (×6): 20 mg via ORAL
  Filled 2016-10-17 (×6): qty 1

## 2016-10-17 MED ORDER — SPIRONOLACTONE 25 MG PO TABS
50.0000 mg | ORAL_TABLET | Freq: Every day | ORAL | Status: DC
Start: 2016-10-18 — End: 2016-10-20
  Administered 2016-10-18 – 2016-10-20 (×3): 50 mg via ORAL
  Filled 2016-10-17 (×3): qty 2

## 2016-10-17 NOTE — H&P (Signed)
ELECTROPHYSIOLOGY CONSULT NOTE    Patient ID: Adam Benson MRN: [id number], DOB/AGE: 1961-03-15 56 y.o.  Admit date: 10/17/2016 Date of Consult: @TODAY @  Primary Physician: Eunice Blase, PA-C Primary Cardiologist: Shirlee Latch Electrophysiologist: Graciela Husbands  CC: here for Tikosyn admission  HPI:  Adam Benson is a 56 y.o. male with a past medical history significant for persistent atrial fibrillation/flutter, prior ARDS with subsequent chronic respiratory failure, hypertension, OSA, and chronic diastolic heart failure. He has a long history of persistent atrial arrhythmias and was previously on a combination of Multaq and Ranexa. His insurance has recently stated they will no longer pay for Multaq and this has been discontinued. He has had recurrent atrial fibrillation and underwent DCCV last in 07/2016 with recurrent AF. According to Dr Odessa Fleming last note, he was unable to tell clinical difference between AF and SR.  At that visit, Tikosyn initiation was recommended. He is also being referred to Dr Sampson Goon for possible ablation; however, there is concern about being able to come of ventilator post procedure.   He currently denies chest pain, shortness of breath above baseline, LE edema above baseline, recent fevers, chills, nausea or vomiting.   Last echo 07/2016 demonstrated EF 50%, diffuse hypokinesis, LA 50.   Past Medical History:  Diagnosis Date  . ARDS (adult respiratory distress syndrome) (HCC)   . GERD (gastroesophageal reflux disease)   . Gout   . History of tracheostomy 06/2014>>out 07/2014   during 2016 hosp stay /vent  . HTN (hypertension), benign   . Hyperlipemia   . Insomnia   . Nerve damage    right arm  . Obesity   . Peptic ulcer 04/2014  . Persistent atrial fibrillation [facility])      Surgical History:  Past Surgical History:  Procedure Laterality Date  . APPENDECTOMY    . arm surgery     right  . CARDIAC CATHETERIZATION N/A 04/29/2016   Procedure: Right Heart Cath;   Surgeon: Laurey Morale, MD;  Location: [facility] INVASIVE CV LAB;  Service: Cardiovascular;  Laterality: N/A;  . CARDIOVERSION N/A 08/26/2016   Procedure: CARDIOVERSION;  Surgeon: Laurey Morale, MD;  Location: [facility] ENDOSCOPY;  Service: Cardiovascular;  Laterality: N/A;  . KNEE SURGERY     right     Prescriptions Prior to Admission  Medication Sig Dispense Refill Last Dose  . diltiazem (CARDIZEM CD) 240 MG 24 hr capsule Take 240 mg by mouth daily.   10/17/2016 at Unknown time  . famotidine (PEPCID) 20 MG tablet Take 1 tablet (20 mg total) by mouth 2 (two) times daily. 60 tablet 1 10/17/2016 at Unknown time  . Febuxostat (ULORIC) 80 MG TABS Take 80 mg by mouth daily.    10/17/2016 at Unknown time  . fenofibrate (TRICOR) 145 MG tablet Take 1 tablet (145 mg total) by mouth daily. 30 tablet 1 10/17/2016 at Unknown time  . furosemide (LASIX) 80 MG tablet Take 1 tab in AM and 1/2 tab in PM (Patient taking differently: Take 40-80 mg by mouth See admin instructions. Take 80 mg by mouth in the morning and take 40 mg by mouth in the evening) 45 tablet 6 10/16/2016 at Unknown time  . HYDROcodone-acetaminophen (NORCO) 10-325 MG tablet Take 1 tablet by mouth 4 (four) times daily.   10/17/2016 at Unknown time  . metoprolol tartrate (LOPRESSOR) 25 MG tablet Take 25 mg by mouth 2 (two) times daily.   10/17/2016 at 0700  . omeprazole (PRILOSEC) 20 MG capsule Take 20 mg by mouth  2 (two) times daily.   10/17/2016 at Unknown time  . OXYGEN Inhale 7 L into the lungs continuous.   10/17/2016 at Unknown time  . potassium chloride SA (K-DUR,KLOR-CON) 20 MEQ tablet Take 2 tablets (40 mEq total) by mouth daily. 60 tablet 3 10/17/2016 at Unknown time  . predniSONE (DELTASONE) 10 MG tablet Take 0.5 tablets (5 mg total) by mouth daily with breakfast. 30 tablet 2 10/17/2016 at Unknown time  . pregabalin (LYRICA) 200 MG capsule Take 1 capsule (200 mg total) by mouth 3 (three) times daily. Take 1 capsule three times a day. 90 capsule 0 10/17/2016  at Unknown time  . ranolazine (RANEXA) 1000 MG SR tablet Take 1,000 mg by mouth 2 (two) times daily.   10/17/2016 at Unknown time  . rivaroxaban (XARELTO) 20 MG TABS tablet Take 20 mg by mouth daily.    10/17/2016 at 0700  . spironolactone (ALDACTONE) 25 MG tablet Take 25 mg by mouth daily.    10/17/2016 at Unknown time  . testosterone cypionate (DEPOTESTOSTERONE CYPIONATE) 200 MG/ML injection Inject 200 mg into the muscle every Wednesday.    Past Week at Unknown time  . torsemide (DEMADEX) 20 MG tablet Take 20 mg by mouth 2 (two) times daily.   10/16/2016 at Unknown time    Inpatient Medications:   Allergies:  Allergies  Allergen Reactions  . Penicillins Hives and Shortness Of Breath    Has patient had a PCN reaction causing immediate rash, facial/tongue/throat swelling, SOB or lightheadedness with hypotension: UNKNOWN Has patient had a PCN reaction causing severe rash involving mucus membranes or skin necrosis: No Has patient had a PCN reaction that required hospitalization No Has patient had a PCN reaction occurring within the last 10 years: No If all of the above answers are "NO", then may proceed with Cephalosporin use.   Marland Kitchen Hydrochlorothiazide Other (See Comments)    hypercalemia    Social History   Social History  . Marital status: Divorced    Spouse name: N/A  . Number of children: N/A  . Years of education: N/A   Occupational History  . unemployed    Social History Main Topics  . Smoking status: Never Smoker  . Smokeless tobacco: Current User    Types: Chew  . Alcohol use No  . Drug use: No  . Sexual activity: Not on file   Other Topics Concern  . Not on file   Social History Narrative   Lives alone. Divorced. No known exposure to mold. No recent travel. No hot tub exposure.        Family History  Problem Relation Age of Onset  . Heart disease Mother   . Diabetes Mother   . Hypertension Mother      Review of Systems: All other systems reviewed and are  otherwise negative except as noted above.  Physical Exam: Vitals:   10/17/16 1120  BP: 110/82  Pulse: 76  Temp: 98.8 F (37.1 C)  TempSrc: Oral  SpO2: 100%  Weight: (!) 338 lb 2 oz (153.4 kg)  Height: 6' (1.829 m)    GEN- The patient is morbidly obese appearing, alert and oriented x 3 today.   HEENT: normocephalic, atraumatic; sclera clear, conjunctiva pink; hearing intact; oropharynx clear; neck supple Lungs- Clear to ausculation bilaterally, normal work of breathing.  No wheezes, rales, rhonchi Heart- Irregular rate and rhythm, no murmurs, rubs or gallops  GI- soft, non-tender, non-distended, bowel sounds present  Extremities- no clubbing, cyanosis, 2+ BLE edema MS-  no significant deformity or atrophy Skin- warm and dry, no rash or lesion Psych- euthymic mood, full affect Neuro- strength and sensation are intact  Labs:   Lab Results  Component Value Date   WBC 8.5 08/03/2016   HGB 13.4 08/03/2016   HCT 45.0 08/03/2016   MCV 74.0 (L) 08/03/2016   PLT 207 08/03/2016     Recent Labs Lab 10/17/16 1648  NA 136  K 3.9  CL 96*  CO2 31  BUN 23*  CREATININE 2.21*  CALCIUM 8.9  GLUCOSE 133*      Radiology/Studies: Dg Chest 2 View Result Date: 10/10/2016 CLINICAL DATA:  Shortness of breath and congestion for 2 weeks. EXAM: CHEST  2 VIEW COMPARISON:  Chest radiograph August 19, 2015 and chest CT July 12, 2016 FINDINGS: Areas of fibrotic change throughout the lungs remain without change. There is no edema or consolidation. Heart is mildly enlarged with pulmonary venous hypertension. No adenopathy. No bone lesions. IMPRESSION: Stable fibrosis throughout the lungs. Pulmonary vascular congestion. No consolidation or volume loss. No adenopathy evident. Electronically Signed   By: Bretta Bang III M.D.   On: 10/10/2016 10:04    HYQ:MVHQIO fibrillation, rate 73, QTc (personally reviewed)  TELEMETRY: atrial fibrillation (personally  reviewed)   Assessment/Plan: 1.  Persistent atrial fibrillation The patient has persistent atrial fibrillation that is minimally (if at all symptomatic) - he was unable to tell a difference in SR after last cardioversion although he thinks he might have felt better but could not tell whether he was having palpitations or not. Plan is for Tikosyn initiation per Dr Lorelee Cover is low today - will replete prior to initiation Continue Xarelto for CHADS2VASC of 2.  He reports compliance for the last 4 weeks with no missed doses CrCl is 133 by actual weight today. Discussed with Dr Graciela Husbands, will start Tiksoyn twice daily With morbid obesity, LA enlargement, and chronic respiratory failure, I am not optimistic about our ability to maintain SR.  Plan DCCV Wednesday if still in AF  2.  Chronic hypoxic respiratory failure Followed by pulmonary On 7L home O2 chronically Will continue while here  3.  OSA Compliance with CPAP encouraged  4.  CKD, stage III Stable No change required today  5.  Chronic diastolic heart failure Stable No change required today. He has minimal evidence of volume overload but his obesity makes evaluation difficult.   Leonia Reeves.D.

## 2016-10-17 NOTE — Progress Notes (Signed)
Pharmacy Review for Dofetilide (Tikosyn) Initiation  Admit Complaint: 56 y.o. male admitted 10/17/2016 with atrial fibrillation to be initiated on dofetilide.   Assessment:  Patient Exclusion Criteria: If any screening criteria checked as "Yes", then  patient  should NOT receive dofetilide until criteria item is corrected. If "Yes" please indicate correction plan.  YES  NO Patient  Exclusion Criteria Correction Plan  []  [x]  Baseline QTc interval is greater than or equal to 440 msec. IF above YES box checked dofetilide contraindicated unless patient has ICD; then may proceed if QTc 500-550 msec or with known ventricular conduction abnormalities may proceed with QTc 550-600 msec. QTc =  442   []  [x]  Magnesium level is less than 1.8 mEq/l : Last magnesium:  Lab Results  Component Value Date   MG 2.0 10/17/2016         []  [x]  Potassium level is less than 4 mEq/l : Last potassium:  Lab Results  Component Value Date   K 3.9 10/17/2016       K repleted - repeat BMET pending  []  [x]  Patient is known or suspected to have a digoxin level greater than 2 ng/ml: No results found for: DIGOXIN    []  [x]  Creatinine clearance less than 20 ml/min (calculated using Cockcroft-Gault, actual body weight and serum creatinine): Estimated Creatinine Clearance: 61.8 mL/min (A) (by C-G formula based on SCr of 2.11 mg/dL (H)).    []  [x]  Patient has received drugs known to prolong the QT intervals within the last 48 hours (phenothiazines, tricyclics or tetracyclic antidepressants, erythromycin, H-1 antihistamines, cisapride, fluoroquinolones, azithromycin). Drugs not listed above may have an, as yet, undetected potential to prolong the QT interval, updated information on QT prolonging agents is available at this website:QT prolonging agents   []  [x]  Patient received a dose of hydrochlorothiazide (Oretic) alone or in any combination including triamterene (Dyazide, Maxzide) in the last 48 hours.   []  [x]  Patient  received a medication known to increase dofetilide plasma concentrations prior to initial dofetilide dose:  . Trimethoprim (Primsol, Proloprim) in the last 36 hours . Verapamil (Calan, Verelan) in the last 36 hours or a sustained release dose in the last 72 hours . Megestrol (Megace) in the last 5 days  . Cimetidine (Tagamet) in the last 6 hours . Ketoconazole (Nizoral) in the last 24 hours . Itraconazole (Sporanox) in the last 48 hours  . Prochlorperazine (Compazine) in the last 36 hours    []  [x]  Patient is known to have a history of torsades de pointes; congenital or acquired long QT syndromes.   []  [x]  Patient has received a Class 1 antiarrhythmic with less than 2 half-lives since last dose. (Disopyramide, Quinidine, Procainamide, Lidocaine, Mexiletine, Flecainide, Propafenone)   []  [x]  Patient has received amiodarone therapy in the past 3 months or amiodarone level is greater than 0.3 ng/ml.    Patient has been appropriately anticoagulated with Xarelto.  Ordering provider was confirmed at [email or web address] if they are not listed on the [facility] Authorized Prescribers list.  Goal of Therapy: Follow renal function, electrolytes, potential drug interactions, and dose adjustment. Provide education and 1 week supply at discharge.  Plan:  [x]   Physician selected initial dose within range recommended for patients level of renal function - will monitor for response.  []   Physician selected initial dose outside of range recommended for patients level of renal function - will discuss if the dose should be altered at this time.   Select One Calculated CrCl  Dose q12h  []  >  60 ml/min 500 mcg  [x]  40-60 ml/min 250 mcg  []  20-40 ml/min 125 mcg   2. Follow up QTc after the first 5 doses, renal function, electrolytes (K & Mg) daily x 3     days, dose adjustment, success of initiation and facilitate 1 week discharge supply as     clinically indicated.  3. Initiate Tikosyn education video (Call  [phone] and ask for Tikosyn Video # [phone]).  4. Place Enrollment Form on the chart for discharge supply of dofetilide.   Gardner Candle 9:49 PM 10/17/2016

## 2016-10-17 NOTE — Progress Notes (Signed)
Primary Care Physician: Eunice Blase, PA-C Cardiologist: Dr. 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 male with a h/o atrial fibrillation that is in the afib clinic, referred by Dr. Graciela Husbands, for admission for tikosyn. He has had recurrent atrial arrhythmias, afib as well as flutter, typical and atypical.He had ERAF with  cardioversion 3/18. He has been seen by Dr. Sampson Goon in [place] in the past. Had been on multaq until recently and now insurance will not cover. He has been considered for ablation but there were lung issues with h/o interstitial disease and ARDS. He is on chronic oxygen supplementation at 7L/min.  Echocardiogram demonstrated EF of 45% with moderate MR; Echo 3/18 EF 50% with a normal-appearing RV. Right heart catheterization 12/17 had demonstrated only mild elevation of filling pressures and pulmonary venous hypertension.   There has been no interruption of his Doac for at least 3 weeks. Meds reviewed by PharmD and no QT prolonging drugs on board( mild qtc prolongation with ranexa but not absolute contraindication). Cost should not be an issue with drug with pt being on medicaid.Risk vrs benefit of drug discussed.  Today, he denies symptoms of palpitations, chest pain, shortness of breath, orthopnea, PND, lower extremity edema, dizziness, presyncope, syncope, or neurologic sequela. The patient is tolerating medications without difficulties and is otherwise without complaint today.   Past Medical History:  Diagnosis Date  . ARDS (adult respiratory distress syndrome) (HCC)   . GERD (gastroesophageal reflux disease)   . Gout   . History of tracheostomy 06/2014>>out 07/2014   during 2016 hosp stay /vent  . HTN (hypertension), benign   . Hyperlipemia   . Insomnia   . Nerve damage    right arm  . Obesity   . Peptic ulcer 04/2014  . Persistent atrial fibrillation [facility])    Past Surgical History:  Procedure Laterality Date  . APPENDECTOMY    . arm surgery     right  . CARDIAC CATHETERIZATION N/A 04/29/2016   Procedure: Right Heart Cath;  Surgeon: Laurey Morale, MD;  Location: [facility] INVASIVE CV LAB;  Service: Cardiovascular;  Laterality: N/A;  . CARDIOVERSION N/A 08/26/2016   Procedure: CARDIOVERSION;  Surgeon: Laurey Morale, MD;  Location: [facility] ENDOSCOPY;  Service: Cardiovascular;  Laterality: N/A;  . KNEE SURGERY     right    No current outpatient prescriptions on file.   No current facility-administered medications for this encounter.     Allergies  Allergen Reactions  . Penicillins Hives and Shortness Of Breath    Has patient had a PCN reaction causing immediate rash, facial/tongue/throat swelling, SOB or lightheadedness with hypotension: UNKNOWN Has patient had a PCN reaction causing severe rash involving mucus membranes or skin necrosis: No Has patient had a PCN reaction that required hospitalization No Has patient had a PCN reaction occurring within the last 10 years: No If all of the above answers are "NO", then may proceed with Cephalosporin use.   Marland Kitchen Hydrochlorothiazide Other (See Comments)    hypercalemia    Social History   Social History  . Marital status: Divorced    Spouse name: N/A  . Number of children: N/A  . Years of education: N/A   Occupational History  . unemployed    Social History Main Topics  . Smoking status: Never Smoker  . Smokeless tobacco: Current User    Types: Chew  . Alcohol use No  . Drug use: No  . Sexual activity: Not on file  Other Topics Concern  . Not on file   Social History Narrative   Lives alone. Divorced. No known exposure to mold. No recent travel. No hot tub exposure.       Family History  Problem Relation Age of Onset  . Heart disease Mother   . Diabetes Mother   . Hypertension Mother     ROS- All systems are reviewed and negative except as per the HPI above  Physical Exam: Vitals:   10/17/16 0846  BP: 118/74  Pulse: 73  Weight: (!) 339 lb (153.8 kg)  Height:  6' (1.829 m)   Wt Readings from Last 3 Encounters:  10/17/16 (!) 339 lb (153.8 kg)  10/13/16 (!) 344 lb 12.8 oz (156.4 kg)  10/10/16 (!) 345 lb (156.5 kg)    Labs: Lab Results  Component Value Date   NA 136 10/17/2016   K 3.5 10/17/2016   CL 93 (L) 10/17/2016   CO2 31 10/17/2016   GLUCOSE 100 (H) 10/17/2016   BUN 22 (H) 10/17/2016   CREATININE 2.05 (H) 10/17/2016   CALCIUM 8.7 (L) 10/17/2016   PHOS 3.9 03/06/2015   MG 2.0 10/17/2016   Lab Results  Component Value Date   INR 2.22 04/29/2016   Lab Results  Component Value Date   TRIG 119 02/20/2015     GEN- The patient is well appearing, alert and oriented x 3 today.   Head- normocephalic, atraumatic Eyes-  Sclera clear, conjunctiva pink Ears- hearing intact Oropharynx- clear Neck- supple, no JVP Lymph- no cervical lymphadenopathy Lungs- Clear to ausculation bilaterally, normal work of breathing Heart- irregular rate and rhythm, no murmurs, rubs or gallops, PMI not laterally displaced GI- soft, NT, ND, + BS Extremities- no clubbing, cyanosis, or edema MS- no significant deformity or atrophy Skin- no rash or lesion Psych- euthymic mood, full affect Neuro- strength and sensation are intact  EKG-afib at 73 bpm, qrs int 122 ms, qtc 442 ms Epic records reviewed Echo-Impressions:  - The patient was in atrial fibrillation. Mildly dilated LV with EF   50%, mild diffuse hypokinesis. Normal RV size and systolic   function. Mild biatrial enlargement.    Assessment and Plan: 1. Persistent afib Labs this am reveal K+ of 3.5, Creat at 2.05,Crcl cal at 90.64 and mag at 2.0 Lasix increased to 80/40 since 5/9 with increase in creatinine. K+ was increased to 40 meq at  that time. Discussed with Gypsy Balsam, NP and since pt lives over an hour away, will go ahead and admit, but pt realizes he may not be able to start drug until tomorrow  or when his K+ is at least 4.0.  No interruption of DOAC.  He is currently on ranexa  and this is a mild qtc prolonger. May have to be considered if drug can be stopped.  2. Chronic primary diastolic CHF Per Dr. Shirlee Latch Continue lasix No changes  3. Pulmonary fibrosis O2 per [place] Prednisone 5 mg daily No change  Admit to [facility] [address] [place], [place] [address] [phone]

## 2016-10-18 LAB — BASIC METABOLIC PANEL
ANION GAP: 7 (ref 5–15)
BUN: 21 mg/dL — ABNORMAL HIGH (ref 6–20)
CALCIUM: 8.7 mg/dL — AB (ref 8.9–10.3)
CO2: 34 mmol/L — ABNORMAL HIGH (ref 22–32)
Chloride: 96 mmol/L — ABNORMAL LOW (ref 101–111)
Creatinine, Ser: 1.98 mg/dL — ABNORMAL HIGH (ref 0.61–1.24)
GFR, EST AFRICAN AMERICAN: 43 mL/min — AB (ref >60)
GFR, EST NON AFRICAN AMERICAN: 37 mL/min — AB (ref >60)
Glucose, Bld: 90 mg/dL (ref 65–99)
POTASSIUM: 4 mmol/L (ref 3.5–5.1)
Sodium: 137 mmol/L (ref 135–145)

## 2016-10-18 LAB — MAGNESIUM: MAGNESIUM: 2.2 mg/dL (ref 1.7–2.4)

## 2016-10-18 LAB — HIV ANTIBODY (ROUTINE TESTING W REFLEX): HIV SCREEN 4TH GENERATION: NONREACTIVE

## 2016-10-18 MED ORDER — DOFETILIDE 500 MCG PO CAPS
500.0000 ug | ORAL_CAPSULE | Freq: Two times a day (BID) | ORAL | Status: DC
  Administered 2016-10-18 – 2016-10-20 (×5): 500 ug via ORAL
  Filled 2016-10-18 (×5): qty 1

## 2016-10-18 MED ORDER — POTASSIUM CHLORIDE CRYS ER 20 MEQ PO TBCR
40.0000 meq | EXTENDED_RELEASE_TABLET | ORAL | Status: AC
  Administered 2016-10-18: 40 meq via ORAL
  Filled 2016-10-18: qty 2

## 2016-10-18 NOTE — Discharge Instructions (Addendum)
You have an appointment set up with the Atrial Fibrillation Clinic.  Multiple studies have shown that being followed by a dedicated atrial fibrillation clinic in addition to the standard care you receive from your other physicians improves health. We believe that enrollment in the atrial fibrillation clinic will allow us to better care for you.  ° °The phone number to the Atrial Fibrillation Clinic is [phone]. The clinic is staffed Monday through Friday from 8:30am to 5pm. ° °Parking Directions: The clinic is located in the Heart and Vascular Building connected to [facility]. °1)From [address] turn on to [address] and go to the 3rd entrance  (Heart and Vascular entrance) on the right. °2)Look to the right for Heart &Vascular Parking Garage. °3)A code for the entrance is required please call the clinic to receive this.   °4)Take the elevators to the 1st floor. Registration is in the room with the glass walls at the end of the hallway. ° °If you have any trouble parking or locating the clinic, please don’t hesitate to call [phone]. ° °Information on my medicine - XARELTO® (Rivaroxaban) ° °Why was Xarelto® prescribed for you? °Xarelto® was prescribed for you to reduce the risk of a blood clot forming that can cause a stroke if you have a medical condition called atrial fibrillation (a type of irregular heartbeat). ° °What do you need to know about xarelto® ? °Take your Xarelto® ONCE DAILY at the same time every day with your evening meal. °If you have difficulty swallowing the tablet whole, you may crush it and mix in applesauce just prior to taking your dose. ° °Take Xarelto® exactly as prescribed by your doctor and DO NOT stop taking Xarelto® without talking to the doctor who prescribed the medication.  Stopping without other stroke prevention medication to take the place of Xarelto® may increase your risk of developing a clot that causes a stroke.  Refill your prescription before you  run out. ° °After discharge, you should have regular check-up appointments with your healthcare provider that is prescribing your Xarelto®.  In the future your dose may need to be changed if your kidney function or weight changes by a significant amount. ° °What do you do if you miss a dose? °If you are taking Xarelto® ONCE DAILY and you miss a dose, take it as soon as you remember on the same day then continue your regularly scheduled once daily regimen the next day. Do not take two doses of Xarelto® at the same time or on the same day.  ° °Important Safety Information °A possible side effect of Xarelto® is bleeding. You should call your healthcare provider right away if you experience any of the following: °? Bleeding from an injury or your nose that does not stop. °? Unusual colored urine (red or dark brown) or unusual colored stools (red or black). °? Unusual bruising for unknown reasons. °? A serious fall or if you hit your head (even if there is no bleeding). ° °Some medicines may interact with Xarelto® and might increase your risk of bleeding while on Xarelto®. To help avoid this, consult your healthcare provider or pharmacist prior to using any new prescription or non-prescription medications, including herbals, vitamins, non-steroidal anti-inflammatory drugs (NSAIDs) and supplements. ° °This website has more information on Xarelto®: www.xarelto.com. ° °

## 2016-10-18 NOTE — Progress Notes (Signed)
Patient Name: Adam Benson      SUBJECTIVE without complaint and tolerating meds No cp or sob  Past Medical History:  Diagnosis Date  . ARDS (adult respiratory distress syndrome) (HCC)   . GERD (gastroesophageal reflux disease)   . Gout   . History of bleeding peptic ulcer 04/2014  . History of blood transfusion 2015   "related to bleeding ulcer; 7 pints" (10/17/2016)  . History of kidney stones   . History of tracheostomy 06/2014>>out 07/2014   during 2016 hosp stay /vent  . HTN (hypertension), benign   . Hyperlipemia   . Insomnia   . Nerve damage    right arm  . Obesity   . On home oxygen therapy    "7L pulse; q time I breath" (10/17/2016)  . OSA on CPAP   . Persistent atrial fibrillation (HCC)   . Pneumonia 2015; 2016    Scheduled Meds:  Scheduled Meds: . dofetilide  250 mcg Oral Q12H  . famotidine  20 mg Oral BID  . febuxostat  80 mg Oral Daily  . furosemide  80 mg Oral Daily  . HYDROcodone-acetaminophen  1 tablet Oral QID  . metoprolol tartrate  25 mg Oral BID  . potassium chloride SA  40 mEq Oral Daily  . predniSONE  5 mg Oral Q breakfast  . pregabalin  200 mg Oral TID  . ranolazine  1,000 mg Oral BID  . rivaroxaban  20 mg Oral Q breakfast  . sodium chloride flush  3 mL Intravenous Q12H  . spironolactone  50 mg Oral Daily   Continuous Infusions: . sodium chloride     sodium chloride, sodium chloride flush    PHYSICAL EXAM Vitals:   10/17/16 1120 10/17/16 2054 10/18/16 0509 10/18/16 0617  BP: 110/82 113/73 (!) 101/52 114/80  Pulse: 76 72 60 68  Resp:  16 18 18   Temp: 98.8 F (37.1 C) 98.7 F (37.1 C) 97.6 F (36.4 C) 97.7 F (36.5 C)  TempSrc: Oral Oral Oral Oral  SpO2: 100% 100% 100% 100%  Weight: (!) 338 lb 2 oz (153.4 kg)  (!) 338 lb 4.8 oz (153.5 kg) (!) 339 lb 14.4 oz (154.2 kg)  Height: 6' (1.829 m)      Well developed and nourished in no acute distress Morbidly obese  HENT normal Neck supple with JVP-flat Carotids brisk  and full without bruits Clear Irregularly irregular rate and rhythm with controlled ventricular response, no murmurs or gallops Abd-soft with active BS without hepatomegaly No Clubbing cyanosis edema Skin-warm and dry A & Oriented  Grossly normal sensory and motor function  TELEMETRY: Reviewed personnally pt in afib:  ECG personally reviewed afib  QTc 431  Intake/Output Summary (Last 24 hours) at 10/18/16 0720 Last data filed at 10/18/16 0600  Gross per 24 hour  Intake              582 ml  Output              850 ml  Net             -268 ml    LABS: Basic Metabolic Panel:  Recent Labs Lab 10/17/16 0900 10/17/16 1648 10/17/16 2051 10/18/16 0525  NA 136 136 137 137  K 3.5 3.9 3.9 4.0  CL 93* 96* 96* 96*  CO2 31 31 31  34*  GLUCOSE 100* 133* 90 90  BUN 22* 23* 22* 21*  CREATININE 2.05* 2.21* 2.11* 1.98*  CALCIUM  8.7* 8.9 8.9 8.7*  MG 2.0  --   --  2.2   Cardiac Enzymes: No results for input(s): CKTOTAL, CKMB, CKMBINDEX, TROPONINI in the last 72 hours. CBC: No results for input(s): WBC, NEUTROABS, HGB, HCT, MCV, PLT in the last 168 hours. PROTIME: No results for input(s): LABPROT, INR in the last 72 hours. Liver Function Tests: No results for input(s): AST, ALT, ALKPHOS, BILITOT, PROT, ALBUMIN in the last 72 hours. No results for input(s): LIPASE, AMYLASE in the last 72 hours. BNP: BNP (last 3 results)  Recent Labs  08/03/16 1227 10/05/16 1038  BNP 52.8 77.3        ASSESSMENT AND PLAN:  Active Problems:   Persistent atrial fibrillation (HCC)  Admitted for dofetilde will increase dofetilide notwithstanding Cr based on Cr CL  K remains in range  Still in afib  For DCCV in am Signed, Sherryl Manges MD  10/18/2016

## 2016-10-18 NOTE — Care Management Note (Addendum)
Case Management Note  Patient Details  Name: Adam Benson MRN: [id number] Date of Birth: 06/11/1960  Subjective/Objective:  Pt presented for persistent A Fib- Plan for Tikosyn Load. Pt has PCP at [facility] [place] [facility] Seen by PA Greta O'Buch. Pt has transportation to appointments. Pt [facility]).                    Action/Plan: Benefits Check completed for Tikosyn (dofetilide). Pt has Medicaid and Tikosyn is on the non- preferred Medication list. Insurance may not pay for medication. CM did call [facility] to see if they could do a formulary check for cost. Pt will need a prior authorization for Medication. Please call [phone]- Medicaid # [id number] O.  Drug can order the medication. Pt will need a Rx for 7 day supply no refills and the original Rx once stable for d/c. CM will assist with the 7 day supply of Tikosyn for home from the Main Pharmacy.  No further needs from CM at this time.   Expected Discharge Date:                  Expected Discharge Plan:  Home/Self Care  In-House Referral:  NA  Discharge planning Services  CM Consult, Medication Assistance  Post Acute Care Choice:  NA Choice offered to:  NA  DME Arranged:  N/A DME Agency:  NA  HH Arranged:  NA HH Agency:  NA  Status of Service:  Completed, signed off  If discussed at Long Length of Stay Meetings, dates discussed:    Additional Comments:  Gala Lewandowsky, RN 10/18/2016, 11:29 AM

## 2016-10-19 ENCOUNTER — Encounter (HOSPITAL_COMMUNITY)
Admission: AD | Disposition: A | Discharge status: Home / Self Care | Payer: Self-pay | Source: Ambulatory Visit | Attending: Internal Medicine

## 2016-10-19 ENCOUNTER — Telehealth (HOSPITAL_COMMUNITY): Payer: Self-pay | Admitting: *Deleted

## 2016-10-19 ENCOUNTER — Encounter (HOSPITAL_COMMUNITY): Payer: Self-pay | Admitting: Certified Registered"

## 2016-10-19 LAB — BASIC METABOLIC PANEL
ANION GAP: 8 (ref 5–15)
BUN: 19 mg/dL (ref 6–20)
CALCIUM: 8.9 mg/dL (ref 8.9–10.3)
CO2: 33 mmol/L — ABNORMAL HIGH (ref 22–32)
Chloride: 95 mmol/L — ABNORMAL LOW (ref 101–111)
Creatinine, Ser: 1.75 mg/dL — ABNORMAL HIGH (ref 0.61–1.24)
GFR, EST AFRICAN AMERICAN: 50 mL/min — AB (ref >60)
GFR, EST NON AFRICAN AMERICAN: 43 mL/min — AB (ref >60)
Glucose, Bld: 84 mg/dL (ref 65–99)
Potassium: 4 mmol/L (ref 3.5–5.1)
SODIUM: 136 mmol/L (ref 135–145)

## 2016-10-19 LAB — MAGNESIUM: MAGNESIUM: 2.2 mg/dL (ref 1.7–2.4)

## 2016-10-19 SURGERY — CARDIOVERSION
Anesthesia: General

## 2016-10-19 NOTE — Progress Notes (Signed)
EKG done. QTC 503. Will pass on to oncoming nurse to make sure MD is aware before giving next Tikosyn dose. Willl continue to monitor.

## 2016-10-19 NOTE — Discharge Summary (Signed)
ELECTROPHYSIOLOGY PROCEDURE DISCHARGE SUMMARY    Patient ID: Adam Benson,  MRN: [id number], DOB/AGE: [birth year] 79 y.o.  Admit date: 10/17/2016 Discharge date: 10/20/2016  Primary Care Physician: Eunice Blase, PA-C Primary Cardiologist: Shirlee Latch Electrophysiologist: Graciela Husbands  Primary Discharge Diagnosis:  1.  Persistent atrial fibrillation status post Tikosyn loading this admission  Secondary Discharge Diagnosis:  1.  Prior ARDS with chronic hypoxic respiratory failure 2.  Obesity 3.  OSA 4.  HTN   Allergies  Allergen Reactions  . Penicillins Hives and Shortness Of Breath    Has patient had a PCN reaction causing immediate rash, facial/tongue/throat swelling, SOB or lightheadedness with hypotension: UNKNOWN Has patient had a PCN reaction causing severe rash involving mucus membranes or skin necrosis: No Has patient had a PCN reaction that required hospitalization No Has patient had a PCN reaction occurring within the last 10 years: No If all of the above answers are "NO", then may proceed with Cephalosporin use.   Marland Kitchen Hydrochlorothiazide Other (See Comments)    hypercalemia     Procedures This Admission:  1.  Tikosyn loading   Brief HPI: Adam Benson is a 56 y.o. male with a past medical history as noted above.  He has previously failed Multaq. He is felt to be high risk for ablation with chronic respiratory failure. Risks, benefits, and alternatives to Tikosyn were reviewed with the patient who wished to proceed.    Hospital Course:  The patient was admitted and Tikosyn was initiated.  Renal function and electrolytes were followed during the hospitalization.  Their QTc remained stable. They were monitored until discharge on telemetry which demonstrated sinus rhythm.  On the day of discharge, they were examined by Dr Graciela Husbands who considered them stable for discharge to home.  Follow-up has been arranged with AF clinic in 1 week and with Dr Graciela Husbands in 4 weeks.   Physical  Exam: Vitals:   10/19/16 0404 10/19/16 1245 10/19/16 2149 10/20/16 0432  BP: 122/70 127/74 117/77 122/82  Pulse: 63  71 66  Resp: 17 18    Temp: 97.6 F (36.4 C) 97.8 F (36.6 C) 98.6 F (37 C) 98.2 F (36.8 C)  TempSrc: Oral Oral Oral Oral  SpO2: 100% 100% 100% 100%  Weight: (!) 336 lb 14.4 oz (152.8 kg)   (!) 334 lb 14.4 oz (151.9 kg)  Height:        GEN- The patient is obese appearing, alert and oriented x 3 today.   HEENT: normocephalic, atraumatic; sclera clear, conjunctiva pink; hearing intact; oropharynx clear; neck supple  Lungs- Clear to ausculation bilaterally, normal work of breathing.  No wheezes, rales, rhonchi Heart- Regular rate and rhythm  GI- soft, non-tender, non-distended, bowel sounds present  Extremities- no clubbing, cyanosis, or edema  MS- no significant deformity or atrophy Skin- warm and dry, no rash or lesion Psych- euthymic mood, full affect Neuro- strength and sensation are intact   Labs:   Lab Results  Component Value Date   WBC 8.5 08/03/2016   HGB 13.4 08/03/2016   HCT 45.0 08/03/2016   MCV 74.0 (L) 08/03/2016   PLT 207 08/03/2016     Recent Labs Lab 10/20/16 0415  NA 137  K 4.2  CL 96*  CO2 33*  BUN 14  CREATININE 1.66*  CALCIUM 9.3  GLUCOSE 86     Discharge Medications:  Allergies as of 10/20/2016      Reactions   Penicillins Hives, Shortness Of Breath   Has patient had  a PCN reaction causing immediate rash, facial/tongue/throat swelling, SOB or lightheadedness with hypotension: UNKNOWN Has patient had a PCN reaction causing severe rash involving mucus membranes or skin necrosis: No Has patient had a PCN reaction that required hospitalization No Has patient had a PCN reaction occurring within the last 10 years: No If all of the above answers are "NO", then may proceed with Cephalosporin use.   Hydrochlorothiazide Other (See Comments)   hypercalemia      Medication List    STOP taking these medications   diltiazem  240 MG 24 hr capsule Commonly known as:  CARDIZEM CD   metoprolol tartrate 25 MG tablet Commonly known as:  LOPRESSOR   torsemide 20 MG tablet Commonly known as:  DEMADEX     TAKE these medications   dofetilide 500 MCG capsule Commonly known as:  TIKOSYN Take 1 capsule (500 mcg total) by mouth every 12 (twelve) hours.   famotidine 20 MG tablet Commonly known as:  PEPCID Take 1 tablet (20 mg total) by mouth 2 (two) times daily.   fenofibrate 145 MG tablet Commonly known as:  TRICOR Take 1 tablet (145 mg total) by mouth daily.   furosemide 80 MG tablet Commonly known as:  LASIX Take 1 tab in AM and 1/2 tab in PM What changed:  how much to take  how to take this  when to take this  additional instructions   HYDROcodone-acetaminophen 10-325 MG tablet Commonly known as:  NORCO Take 1 tablet by mouth 4 (four) times daily.   omeprazole 20 MG capsule Commonly known as:  PRILOSEC Take 20 mg by mouth 2 (two) times daily.   OXYGEN Inhale 7 L into the lungs continuous.   potassium chloride SA 20 MEQ tablet Commonly known as:  K-DUR,KLOR-CON Take 2 tablets (40 mEq total) by mouth daily.   predniSONE 10 MG tablet Commonly known as:  DELTASONE Take 0.5 tablets (5 mg total) by mouth daily with breakfast.   pregabalin 200 MG capsule Commonly known as:  LYRICA Take 1 capsule (200 mg total) by mouth 3 (three) times daily. Take 1 capsule three times a day.   ranolazine 1000 MG SR tablet Commonly known as:  RANEXA Take 1,000 mg by mouth 2 (two) times daily.   rivaroxaban 20 MG Tabs tablet Commonly known as:  XARELTO Take 20 mg by mouth daily.   spironolactone 25 MG tablet Commonly known as:  ALDACTONE Take 25 mg by mouth daily.   testosterone cypionate 200 MG/ML injection Commonly known as:  DEPOTESTOSTERONE CYPIONATE Inject 200 mg into the muscle every Wednesday.   ULORIC 80 MG Tabs Generic drug:  Febuxostat Take 80 mg by mouth daily.       Disposition:    Discharge Instructions    Diet - low sodium heart healthy    Complete by:  As directed    Increase activity slowly    Complete by:  As directed      Follow-up Information    [facility] Follow up on 10/27/2016.   Specialty:  Cardiology Why:  at 1:30PM  Contact information: [address] [id number] [place] [address] [phone]       Duke Salvia, MD Follow up on 11/22/2016.   Specialty:  Cardiology Why:  at 2:30PM Contact information: [address] [place] [address] [phone]           Duration of Discharge Encounter: Greater than 30 minutes including physician time.  Signed, Gypsy Balsam, NP 10/20/2016 10:54 AM

## 2016-10-19 NOTE — Telephone Encounter (Signed)
Preauthorization for Tikosyn approved for 1 year. Auth # [id number].

## 2016-10-19 NOTE — Progress Notes (Addendum)
Electrophysiology Rounding Note  Patient Name: Adam Benson Date of Encounter: 10/19/2016  Primary Cardiologist: Shirlee Latch Electrophysiologist: Graciela Husbands   Subjective   The patient is doing well today.  At this time, the patient denies chest pain, shortness of breath, or any new concerns.  Symptomatically improved in SR  Inpatient Medications    Scheduled Meds: . dofetilide  500 mcg Oral Q12H  . famotidine  20 mg Oral BID  . febuxostat  80 mg Oral Daily  . furosemide  80 mg Oral Daily  . HYDROcodone-acetaminophen  1 tablet Oral QID  . potassium chloride SA  40 mEq Oral Daily  . predniSONE  5 mg Oral Q breakfast  . pregabalin  200 mg Oral TID  . ranolazine  1,000 mg Oral BID  . rivaroxaban  20 mg Oral Q breakfast  . sodium chloride flush  3 mL Intravenous Q12H  . spironolactone  50 mg Oral Daily   Continuous Infusions: . sodium chloride     PRN Meds: sodium chloride, sodium chloride flush   Vital Signs    Vitals:   10/18/16 0617 10/18/16 1412 10/18/16 1920 10/19/16 0404  BP: 114/80 114/73 123/80 122/70  Pulse: 68 63 68 63  Resp: 18 18 18 17   Temp: 97.7 F (36.5 C) 97.7 F (36.5 C) 97.9 F (36.6 C) 97.6 F (36.4 C)  TempSrc: Oral Oral Oral Oral  SpO2: 100% 100% 100% 100%  Weight: (!) 339 lb 14.4 oz (154.2 kg)   (!) 336 lb 14.4 oz (152.8 kg)  Height:        Intake/Output Summary (Last 24 hours) at 10/19/16 0719 Last data filed at 10/19/16 0417  Gross per 24 hour  Intake              340 ml  Output             3875 ml  Net            -3535 ml   Filed Weights   10/18/16 0509 10/18/16 0617 10/19/16 0404  Weight: (!) 338 lb 4.8 oz (153.5 kg) (!) 339 lb 14.4 oz (154.2 kg) (!) 336 lb 14.4 oz (152.8 kg)    Physical Exam    GEN- The patient is morbidly obese appearing, alert and oriented x 3 today.   Head- normocephalic, atraumatic Eyes-  Sclera clear, conjunctiva pink Ears- hearing intact Oropharynx- clear Neck- supple Lungs- Clear to ausculation  bilaterally, normal work of breathing Heart- Regular rate and rhythm, no murmurs, rubs or gallops GI- soft, NT, ND, + BS Extremities- no clubbing, cyanosis, or edema Skin- no rash or lesion Psych- euthymic mood, full affect Neuro- strength and sensation are intact  Labs    CBC No results for input(s): WBC, NEUTROABS, HGB, HCT, MCV, PLT in the last 72 hours. Basic Metabolic Panel  Recent Labs  10/18/16 0525 10/19/16 0324  NA 137 136  K 4.0 4.0  CL 96* 95*  CO2 34* 33*  GLUCOSE 90 84  BUN 21* 19  CREATININE 1.98* 1.75*  CALCIUM 8.7* 8.9  MG 2.2 2.2    Telemetry    Sinus rhythm (personally reviewed)  ECG personally reviewed QTc 503 but QRS d about 140 msec So well within fange   Radiology    No results found.   Patient Profile     Adam Benson is a 56 y.o. male with a past medical history significant for persistent AF, morbid obesity, prior ARDS with chronic respiratory failure, OSA, HTN,  and chronic diastolic heart failure.  He was admitted for Tikosyn loading.   Assessment & Plan    1.  Persistent atrial fibrillation Admitted for Tikosyn loading Converted to SR yesterday QTc stable this morning manually. BMET, Mg stable Anticipate discharge tomorrow  2.  Chronic hypoxic respiratory failure Followed by pulmonary On 7L home O2 chronically Will continue while here  3.  OSA Compliance with CPAP encouraged  4.  CKD, stage III Stable No change required today  5.  Chronic diastolic heart failure Stable No change required today. He has minimal evidence of volume overload but his obesity makes evaluation difficult     Signed, Gypsy Balsam, NP  10/19/2016, 7:19 AM   Seen and agree Cr improving   May continue to improve in Sinus K levels stable

## 2016-10-20 LAB — BASIC METABOLIC PANEL
Anion gap: 8 (ref 5–15)
BUN: 14 mg/dL (ref 6–20)
CHLORIDE: 96 mmol/L — AB (ref 101–111)
CO2: 33 mmol/L — ABNORMAL HIGH (ref 22–32)
Calcium: 9.3 mg/dL (ref 8.9–10.3)
Creatinine, Ser: 1.66 mg/dL — ABNORMAL HIGH (ref 0.61–1.24)
GFR calc Af Amer: 53 mL/min — ABNORMAL LOW (ref >60)
GFR calc non Af Amer: 46 mL/min — ABNORMAL LOW (ref >60)
GLUCOSE: 86 mg/dL (ref 65–99)
POTASSIUM: 4.2 mmol/L (ref 3.5–5.1)
Sodium: 137 mmol/L (ref 135–145)

## 2016-10-20 LAB — MAGNESIUM: Magnesium: 2.1 mg/dL (ref 1.7–2.4)

## 2016-10-20 MED ORDER — DOFETILIDE 500 MCG PO CAPS: 500.0000 ug | ORAL_CAPSULE | Freq: Two times a day (BID) | ORAL | 3 refills | Status: DC

## 2016-10-27 ENCOUNTER — Encounter (HOSPITAL_COMMUNITY): Payer: Self-pay | Admitting: Nurse Practitioner

## 2016-10-27 ENCOUNTER — Ambulatory Visit (HOSPITAL_COMMUNITY)
Admission: RE | Admit: 2016-10-27 | Discharge: 2016-10-27 | Disposition: A | Discharge status: Home / Self Care | Payer: Medicaid Other | Source: Ambulatory Visit | Attending: Nurse Practitioner | Admitting: Nurse Practitioner

## 2016-10-27 VITALS — BP 126/84 | HR 72 | Ht 72.0 in | Wt 338.0 lb

## 2016-10-27 DIAGNOSIS — I481 Persistent atrial fibrillation: Secondary | ICD-10-CM | POA: Diagnosis not present

## 2016-10-27 DIAGNOSIS — G4733 Obstructive sleep apnea (adult) (pediatric): Secondary | ICD-10-CM | POA: Insufficient documentation

## 2016-10-27 DIAGNOSIS — E669 Obesity, unspecified: Secondary | ICD-10-CM | POA: Insufficient documentation

## 2016-10-27 DIAGNOSIS — Z9981 Dependence on supplemental oxygen: Secondary | ICD-10-CM | POA: Insufficient documentation

## 2016-10-27 DIAGNOSIS — I4891 Unspecified atrial fibrillation: Secondary | ICD-10-CM | POA: Diagnosis present

## 2016-10-27 DIAGNOSIS — Z7901 Long term (current) use of anticoagulants: Secondary | ICD-10-CM | POA: Diagnosis not present

## 2016-10-27 DIAGNOSIS — K219 Gastro-esophageal reflux disease without esophagitis: Secondary | ICD-10-CM | POA: Insufficient documentation

## 2016-10-27 DIAGNOSIS — M109 Gout, unspecified: Secondary | ICD-10-CM | POA: Insufficient documentation

## 2016-10-27 DIAGNOSIS — R9431 Abnormal electrocardiogram [ECG] [EKG]: Secondary | ICD-10-CM | POA: Diagnosis not present

## 2016-10-27 DIAGNOSIS — I440 Atrioventricular block, first degree: Secondary | ICD-10-CM | POA: Insufficient documentation

## 2016-10-27 DIAGNOSIS — I5032 Chronic diastolic (congestive) heart failure: Secondary | ICD-10-CM | POA: Diagnosis not present

## 2016-10-27 DIAGNOSIS — J8410 Pulmonary fibrosis, unspecified: Secondary | ICD-10-CM | POA: Insufficient documentation

## 2016-10-27 DIAGNOSIS — E785 Hyperlipidemia, unspecified: Secondary | ICD-10-CM | POA: Diagnosis not present

## 2016-10-27 DIAGNOSIS — Z79899 Other long term (current) drug therapy: Secondary | ICD-10-CM | POA: Diagnosis not present

## 2016-10-27 DIAGNOSIS — I10 Essential (primary) hypertension: Secondary | ICD-10-CM | POA: Insufficient documentation

## 2016-10-27 DIAGNOSIS — I4819 Other persistent atrial fibrillation: Secondary | ICD-10-CM

## 2016-10-27 DIAGNOSIS — Z880 Allergy status to penicillin: Secondary | ICD-10-CM | POA: Insufficient documentation

## 2016-10-27 LAB — BASIC METABOLIC PANEL
Anion gap: 10 (ref 5–15)
BUN: 13 mg/dL (ref 6–20)
CALCIUM: 9.2 mg/dL (ref 8.9–10.3)
CHLORIDE: 96 mmol/L — AB (ref 101–111)
CO2: 31 mmol/L (ref 22–32)
CREATININE: 1.77 mg/dL — AB (ref 0.61–1.24)
GFR calc Af Amer: 49 mL/min — ABNORMAL LOW (ref >60)
GFR calc non Af Amer: 42 mL/min — ABNORMAL LOW (ref >60)
GLUCOSE: 106 mg/dL — AB (ref 65–99)
Potassium: 4.2 mmol/L (ref 3.5–5.1)
Sodium: 137 mmol/L (ref 135–145)

## 2016-10-27 LAB — MAGNESIUM: Magnesium: 1.9 mg/dL (ref 1.7–2.4)
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
## 2016-11-02 ENCOUNTER — Telehealth: Payer: Self-pay | Admitting: Pulmonary Disease

## 2016-11-02 NOTE — Telephone Encounter (Signed)
CPAP 09/17/16 to 10/16/16 >> used on 29 of 30 nights with average 2 hrs 41 min.  Average AHI 3.6 with median CPAP 11 and 95 th percentile CPAP 13 cm H2O.   Will have my nurse inform pt that CPAP report shows control of sleep apnea.  He needs to use for entire time he is asleep to get maximal benefit.  Will call him pack after review of his ONO with CPAP and 5 liters.

## 2016-11-03 ENCOUNTER — Encounter (HOSPITAL_COMMUNITY): Payer: Self-pay | Admitting: *Deleted

## 2016-11-03 NOTE — Telephone Encounter (Signed)
ATC no voicemail. WCB

## 2016-11-04 NOTE — Telephone Encounter (Signed)
ATC no voicemail Automated message stating caller not available. WCB

## 2016-11-07 NOTE — Telephone Encounter (Signed)
ATC to contact patient, received unavailable message. Will try again at later time.

## 2016-11-08 NOTE — Telephone Encounter (Signed)
ATC, received recording stating caller is unavailable. Will call back.

## 2016-11-09 NOTE — Telephone Encounter (Signed)
ATC, but received message caller is unavailable. Letter will be sent to address on file asking patient to contact office.

## 2016-11-10 ENCOUNTER — Encounter: Payer: Self-pay | Admitting: Pulmonary Disease

## 2016-11-17 ENCOUNTER — Telehealth: Payer: Self-pay | Admitting: Pulmonary Disease

## 2016-11-17 NOTE — Telephone Encounter (Signed)
ONO with CPAP and 5 liters 10/31/16 >> test time 7 hrs 28 min.  Basal SpO2 90%, low SpO2 63%.  Spent 78.4 min with SpO2 < 88%.   Will have my nurse inform pt that ONO showed oxygen level still low at night on current set up.  He needs in lab titration study to determine optimal settings for CPAP and oxygen.  If he is agreeable to this, then please send order for CPAP titration starting on 11 cm H2O.  Can then transition to Bipap and adjust oxygen as needed.

## 2016-11-21 ENCOUNTER — Encounter (HOSPITAL_COMMUNITY): Payer: Self-pay

## 2016-11-21 ENCOUNTER — Ambulatory Visit (HOSPITAL_COMMUNITY)
Admission: RE | Admit: 2016-11-21 | Discharge: 2016-11-21 | Disposition: A | Discharge status: Home / Self Care | Payer: Medicaid Other | Source: Ambulatory Visit | Attending: Cardiology | Admitting: Cardiology

## 2016-11-21 VITALS — BP 122/94 | HR 78 | Wt 337.8 lb

## 2016-11-21 DIAGNOSIS — Z7901 Long term (current) use of anticoagulants: Secondary | ICD-10-CM | POA: Diagnosis not present

## 2016-11-21 DIAGNOSIS — Z9981 Dependence on supplemental oxygen: Secondary | ICD-10-CM | POA: Insufficient documentation

## 2016-11-21 DIAGNOSIS — I4891 Unspecified atrial fibrillation: Secondary | ICD-10-CM | POA: Insufficient documentation

## 2016-11-21 DIAGNOSIS — I2729 Other secondary pulmonary hypertension: Secondary | ICD-10-CM

## 2016-11-21 DIAGNOSIS — N183 Chronic kidney disease, stage 3 (moderate): Secondary | ICD-10-CM | POA: Insufficient documentation

## 2016-11-21 DIAGNOSIS — I481 Persistent atrial fibrillation: Secondary | ICD-10-CM

## 2016-11-21 DIAGNOSIS — J8410 Pulmonary fibrosis, unspecified: Secondary | ICD-10-CM | POA: Diagnosis not present

## 2016-11-21 DIAGNOSIS — I5032 Chronic diastolic (congestive) heart failure: Secondary | ICD-10-CM | POA: Diagnosis not present

## 2016-11-21 DIAGNOSIS — I4892 Unspecified atrial flutter: Secondary | ICD-10-CM | POA: Insufficient documentation

## 2016-11-21 DIAGNOSIS — I130 Hypertensive heart and chronic kidney disease with heart failure and stage 1 through stage 4 chronic kidney disease, or unspecified chronic kidney disease: Secondary | ICD-10-CM | POA: Diagnosis not present

## 2016-11-21 DIAGNOSIS — I4819 Other persistent atrial fibrillation: Secondary | ICD-10-CM

## 2016-11-21 LAB — CBC
HCT: 47.6 % (ref 39.0–52.0)
Hemoglobin: 14.5 g/dL (ref 13.0–17.0)
MCH: 23 pg — ABNORMAL LOW (ref 26.0–34.0)
MCHC: 30.5 g/dL (ref 30.0–36.0)
MCV: 75.4 fL — AB (ref 78.0–100.0)
PLATELETS: 278 10*3/uL (ref 150–400)
RBC: 6.31 MIL/uL — ABNORMAL HIGH (ref 4.22–5.81)
RDW: 20 % — AB (ref 11.5–15.5)
WBC: 9.1 10*3/uL (ref 4.0–10.5)

## 2016-11-21 LAB — BASIC METABOLIC PANEL
Anion gap: 10 (ref 5–15)
BUN: 15 mg/dL (ref 6–20)
CALCIUM: 9.1 mg/dL (ref 8.9–10.3)
CHLORIDE: 96 mmol/L — AB (ref 101–111)
CO2: 31 mmol/L (ref 22–32)
CREATININE: 1.5 mg/dL — AB (ref 0.61–1.24)
GFR calc Af Amer: 60 mL/min — ABNORMAL LOW (ref >60)
GFR calc non Af Amer: 51 mL/min — ABNORMAL LOW (ref >60)
Glucose, Bld: 93 mg/dL (ref 65–99)
Potassium: 3.9 mmol/L (ref 3.5–5.1)
SODIUM: 137 mmol/L (ref 135–145)

## 2016-11-21 LAB — MAGNESIUM: Magnesium: 2.1 mg/dL (ref 1.7–2.4)

## 2016-11-21 MED ORDER — RANOLAZINE ER 1000 MG PO TB12: 1000.0000 mg | ORAL_TABLET | Freq: Two times a day (BID) | ORAL | 6 refills | Status: DC

## 2016-11-21 NOTE — Progress Notes (Signed)
PCP: [facility] Pulmonary: Dr. [name] Cardiology: Dr. 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  He has been followed in [facility] in the past for cardiology. Dr. [name] has been following him for pulmonary fibrosis. He had ARDs twice in 2016, once 1/16-2/16.  The second time, he had PNA developing into ARDS and was in the hospital for 3 months.  High resolution CT done 2/18 showed pulmonary fibrosis.  He is on home oxygen during the day and CPAP (for OSA) at night.  He is on prednisone 5 mg daily for pulmonary fibrosis.   I took him for RHC in 12/17.  This showed mildly elevated filling pressures and pulmonary venous hypertension.   He has also had atrial arrhythmias, both atrial fibrillation and atrial flutter.  He has been cardioverted several times.  He has been on ranolazine and Multaq, but his insurance company said that it would not cover Multaq any longer and he stopped it.  He had DCCV to NSR in 3/18 but was back in atrial fibrillation in 5/18.  I had him evaluated by EP and he was admitted for dofetilide load.  Now on dofetilide and in NSR.    He feels better in NSR.  Weight is down about 10 lbs.  He can walk on flat ground without dyspnea but oxygen saturation drops still.  No lightheadedness.  No chest pain. Dyspnea now only with heavy exertion.    Labs (12/17): K 4.6, creatinine 1.98, hgb 11.5 Labs (3/18): K 4.2, creatinine 1.79, BNP 53, hgb 13.4 Labs (4/18): K 4.5, creatinine 1.8 Labs (5/18): K 4.2, creatinine 1.77, Mg 1.9  ECG (personally reviewed): NSR, 1st degree AVB, QTc 497  PMH; 1. PUD 2. GERD 3. OSA: Using CPAP.  4. ARDS: 1/16-2/16 initially, then admitted again in 9/16 for 3 months with PNA and ARDS.  He developed post-inflammatory pulmonary fibrosis. 5. Pulmonary fibrosis: Suspect post-inflammatory  pulmonary fibrosis developing after PNA and ARDS.  - High resolution CT chest (2/18): Pulmonary fibrosis noted, looks like NSIP versus chronic hypersensitivity pneumonitis.  - On home oxygen.  6. Ascending aortic aneurysm:  4.6 cm on CT in 2/18.  7. Atrial arrhythmias: Atrial fibrillation and atrial flutter have both been noted.  He has had several cardioversions in the past.  Was followed at [facility], was on ranolazine + Multaq, but insurance will not cover Multaq.  - DCCV 3/18 to NSR, back in atrial fibrillation by 5/18.  - Dofetilide begun, now in NSR.  8. Cardiomyopathy with chronic primarily diastolic CHF:  - Echo in [place] in the past with EF in 45% range.  - Echo (9/16) with EF 50-55%, moderate biatrial enlargement.  - RHC (12/17): mean 12, PA 44/24 mean 33, mean PCWP 22, CI 3.04, PVR 1.3 WU. - Echo (3/18): EF 50%, diffuse hypokinesis, normal RV size and systolic function.  9. CKD: Stage III.   SH: Nonsmoker, no ETOH.  He used to be an Personnel officer, now on disability.  Lives in [place].   Family History  Problem Relation Age of Onset  . Heart disease Mother   . Diabetes Mother   . Hypertension Mother    ROS: All systems reviewed and negative except as per HPI.   Current Outpatient Prescriptions  Medication Sig Dispense Refill  . dofetilide (TIKOSYN) 500 MCG capsule Take 1 capsule (500 mcg total)  by mouth every 12 (twelve) hours. 180 capsule 3  . famotidine (PEPCID) 20 MG tablet Take 1 tablet (20 mg total) by mouth 2 (two) times daily. 60 tablet 1  . Febuxostat (ULORIC) 80 MG TABS Take 80 mg by mouth daily.     . fenofibrate (TRICOR) 145 MG tablet Take 1 tablet (145 mg total) by mouth daily. 30 tablet 1  . furosemide (LASIX) 80 MG tablet Take 1 tab in AM and 1/2 tab in PM (Patient taking differently: Take 40-80 mg by mouth See admin instructions. Take 80 mg by mouth in the morning and take 40 mg by mouth in the evening) 45 tablet 6  . HYDROcodone-acetaminophen (NORCO)  10-325 MG tablet Take 1 tablet by mouth 4 (four) times daily.    Marland Kitchen omeprazole (PRILOSEC) 20 MG capsule Take 20 mg by mouth 2 (two) times daily.    . OXYGEN Inhale 7 L into the lungs continuous.    . potassium chloride SA (K-DUR,KLOR-CON) 20 MEQ tablet Take 2 tablets (40 mEq total) by mouth daily. 60 tablet 3  . predniSONE (DELTASONE) 10 MG tablet Take 0.5 tablets (5 mg total) by mouth daily with breakfast. 30 tablet 2  . pregabalin (LYRICA) 200 MG capsule Take 1 capsule (200 mg total) by mouth 3 (three) times daily. Take 1 capsule three times a day. 90 capsule 0  . ranolazine (RANEXA) 1000 MG SR tablet Take 1 tablet (1,000 mg total) by mouth 2 (two) times daily. 60 tablet 6  . rivaroxaban (XARELTO) 20 MG TABS tablet Take 20 mg by mouth daily.     Marland Kitchen spironolactone (ALDACTONE) 25 MG tablet Take 25 mg by mouth daily.     Marland Kitchen testosterone cypionate (DEPOTESTOSTERONE CYPIONATE) 200 MG/ML injection Inject 200 mg into the muscle every Wednesday.      No current facility-administered medications for this encounter.    BP (!) 122/94   Pulse 78   Wt (!) 337 lb 12.8 oz (153.2 kg)   SpO2 95% Comment: 7L-pulse  BMI 45.81 kg/m  General: NAD, morbidly obese Neck: Thick, JVP 7-8 cm, no thyromegaly or thyroid nodule.  Lungs: Slight dry crackles at bases bilaterally. CV: Nondisplaced PMI.  Heart regular S1/S2, no S3/S4, no murmur.  Trace ankle edema.  No carotid bruit.  Normal pedal pulses.  Abdomen: Soft, nontender, no hepatosplenomegaly, no distention.  Skin: Intact without lesions or rashes.  Neurologic: Alert and oriented x 3.  Psych: Normal affect. Extremities: No clubbing or cyanosis.  HEENT: Normal.   Assessment/Plan: 1. Pulmonary fibrosis: Noted on HRCT.  He is on home oxygen. He is on prednisone 5 mg daily.  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  - Seen by Dr. [name], ?if there is a role for Ofev or pirfenidone.  2. Atrial fibrillation and flutter: Patient was cardioverted in 3/18 to  NSR but was back in atrial fibrillation by 5/18.  Now he has been started on dofetilide and is in NSR.  - Continue dofetilide, ECG looks ok today.  Check BMET and Mg. - Continue Xarelto, check CBC.  - He is on ranolazine in addition to dofetilide, this will increase risk of QT prolongation.  This was apparently continued after his dofetilide hospitalization and has anti-arrhyhmic properties.  However, I would favor stopping ranolazine.  I will ask Dr. Graciela Husbands to weigh in on this (has appointment with Dr. Graciela Husbands tomorrow).  3. Chronic primarily diastolic CHF: Diastolic CHF with pulmonary venous hypertension on 12/17 echo.  NYHA class III symptoms (likely combination of  heart and lungs as cause).  Most recent echo in 3/18 with EF 50% with relatively normal-appearing RV. Volume looks better on exam and weight down 10 lbs. - Continue Lasix 80 qam/40 qpm and KCl 40 daily.  BMET today.  4. CKD: Stage III.  BMET today.   Marca Ancona 11/21/2016

## 2016-11-21 NOTE — Patient Instructions (Signed)
Labs today  We will contact you in 3 months to schedule your next appointment.  

## 2016-11-22 ENCOUNTER — Ambulatory Visit (INDEPENDENT_AMBULATORY_CARE_PROVIDER_SITE_OTHER): Payer: Medicaid Other | Admitting: Internal Medicine

## 2016-11-22 VITALS — BP 142/84 | HR 80 | Ht 72.0 in | Wt 337.0 lb

## 2016-11-22 DIAGNOSIS — Z79899 Other long term (current) drug therapy: Secondary | ICD-10-CM

## 2016-11-22 DIAGNOSIS — I483 Typical atrial flutter: Secondary | ICD-10-CM

## 2016-11-22 DIAGNOSIS — N289 Disorder of kidney and ureter, unspecified: Secondary | ICD-10-CM

## 2016-11-22 DIAGNOSIS — I480 Paroxysmal atrial fibrillation: Secondary | ICD-10-CM | POA: Diagnosis not present

## 2016-11-22 DIAGNOSIS — I428 Other cardiomyopathies: Secondary | ICD-10-CM

## 2016-11-22 NOTE — Patient Instructions (Signed)
Medication Instructions: - Your physician recommends that you continue on your current medications as directed. Please refer to the Current Medication list given to you today.  Labwork: - none ordered  Procedures/Testing: - none ordered  Follow-Up: - Your physician wants you to follow-up in: 6 months with Donna Carroll, NP in the A-fib Clinic & 1 year with Dr. Klein.  You will receive a reminder letter in the mail two months in advance. If you don't receive a letter, please call our office to schedule the follow-up appointment.   Any Additional Special Instructions Will Be Listed Below (If Applicable).     If you need a refill on your cardiac medications before your next appointment, please call your pharmacy.   
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
## 2016-11-22 NOTE — Telephone Encounter (Signed)
Called pt but his number is no longer in service. I will try again later.

## 2016-11-23 NOTE — Telephone Encounter (Signed)
I tried to call pt today, the number is still not going through.

## 2016-11-24 NOTE — Telephone Encounter (Signed)
atc pt X3, number not in service.  Will send letter and close message per triage protocol.

## 2017-01-19 NOTE — Addendum Note (Signed)
Addendum  created 01/19/17 1158 by Kyera Felan, MD   Sign clinical note    

## 2017-02-13 ENCOUNTER — Telehealth: Payer: Self-pay | Admitting: Pulmonary Disease

## 2017-02-13 ENCOUNTER — Ambulatory Visit (INDEPENDENT_AMBULATORY_CARE_PROVIDER_SITE_OTHER): Payer: Medicaid Other | Admitting: Internal Medicine

## 2017-02-13 ENCOUNTER — Encounter: Payer: Self-pay | Admitting: Internal Medicine

## 2017-02-13 ENCOUNTER — Encounter: Payer: Self-pay | Admitting: Pulmonary Disease

## 2017-02-13 VITALS — BP 130/90 | HR 71 | Ht 72.0 in | Wt 354.0 lb

## 2017-02-13 DIAGNOSIS — J9611 Chronic respiratory failure with hypoxia: Secondary | ICD-10-CM | POA: Diagnosis not present

## 2017-02-13 DIAGNOSIS — J849 Interstitial pulmonary disease, unspecified: Secondary | ICD-10-CM

## 2017-02-13 DIAGNOSIS — J209 Acute bronchitis, unspecified: Secondary | ICD-10-CM

## 2017-02-13 MED ORDER — DOXYCYCLINE HYCLATE 100 MG PO TABS: 100.0000 mg | ORAL_TABLET | Freq: Two times a day (BID) | ORAL | 0 refills | Status: DC

## 2017-02-13 MED ORDER — PREDNISONE 10 MG PO TABS: 5.0000 mg | ORAL_TABLET | Freq: Every day | ORAL | 2 refills | Status: DC

## 2017-02-13 NOTE — Telephone Encounter (Signed)
Pt was seen in office today by MR and I told pt about the phone note on 6/21 regarding CPAP titration.  Pt stated that he was okay to have the CPAP titration study done if we still wanted it done.   Dr. Craige Cotta, please advise if you still want this to be done.  See phone note from 11/17/16.  Thanks!
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
## 2017-02-13 NOTE — Patient Instructions (Addendum)
ICD-10-CM   1. Acute bronchitis, unspecified organism J20.9   2. ILD (interstitial lung disease) (HCC) J84.9   3. Chronic respiratory failure with hypoxia (HCC) J96.11     Take doxycycline 100mg  po twice daily x 5 days; take after meals and avoid sunlight  Continue o2 at 7L [place] Restart prednisone 5mg  per day - will send refill Flu shot 02/13/2017 Continue CPAP For transplant they need weight loss - goal weight is 190# (currently at 354#)  Folllowup 6 months or sooner if needed

## 2017-02-13 NOTE — Telephone Encounter (Signed)
Duplicate note in error. See note written already from 02/13/17.

## 2017-02-28 ENCOUNTER — Ambulatory Visit (HOSPITAL_COMMUNITY)
Admission: RE | Admit: 2017-02-28 | Discharge: 2017-02-28 | Disposition: A | Discharge status: Home / Self Care | Payer: Medicaid Other | Source: Ambulatory Visit | Attending: Cardiology | Admitting: Cardiology

## 2017-02-28 ENCOUNTER — Encounter (HOSPITAL_COMMUNITY): Payer: Self-pay | Admitting: Cardiology

## 2017-02-28 VITALS — BP 131/74 | HR 64 | Wt 355.0 lb

## 2017-02-28 DIAGNOSIS — G4733 Obstructive sleep apnea (adult) (pediatric): Secondary | ICD-10-CM | POA: Diagnosis not present

## 2017-02-28 DIAGNOSIS — K219 Gastro-esophageal reflux disease without esophagitis: Secondary | ICD-10-CM | POA: Insufficient documentation

## 2017-02-28 DIAGNOSIS — I4892 Unspecified atrial flutter: Secondary | ICD-10-CM | POA: Insufficient documentation

## 2017-02-28 DIAGNOSIS — I712 Thoracic aortic aneurysm, without rupture: Secondary | ICD-10-CM | POA: Diagnosis not present

## 2017-02-28 DIAGNOSIS — Z8711 Personal history of peptic ulcer disease: Secondary | ICD-10-CM | POA: Diagnosis not present

## 2017-02-28 DIAGNOSIS — I480 Paroxysmal atrial fibrillation: Secondary | ICD-10-CM | POA: Diagnosis not present

## 2017-02-28 DIAGNOSIS — I429 Cardiomyopathy, unspecified: Secondary | ICD-10-CM | POA: Diagnosis not present

## 2017-02-28 DIAGNOSIS — Z7901 Long term (current) use of anticoagulants: Secondary | ICD-10-CM | POA: Diagnosis not present

## 2017-02-28 DIAGNOSIS — I4891 Unspecified atrial fibrillation: Secondary | ICD-10-CM | POA: Insufficient documentation

## 2017-02-28 DIAGNOSIS — E669 Obesity, unspecified: Secondary | ICD-10-CM | POA: Diagnosis not present

## 2017-02-28 DIAGNOSIS — I5032 Chronic diastolic (congestive) heart failure: Secondary | ICD-10-CM | POA: Diagnosis not present

## 2017-02-28 DIAGNOSIS — N183 Chronic kidney disease, stage 3 (moderate): Secondary | ICD-10-CM | POA: Insufficient documentation

## 2017-02-28 DIAGNOSIS — Z9981 Dependence on supplemental oxygen: Secondary | ICD-10-CM | POA: Diagnosis not present

## 2017-02-28 DIAGNOSIS — J8410 Pulmonary fibrosis, unspecified: Secondary | ICD-10-CM | POA: Insufficient documentation

## 2017-02-28 DIAGNOSIS — I130 Hypertensive heart and chronic kidney disease with heart failure and stage 1 through stage 4 chronic kidney disease, or unspecified chronic kidney disease: Secondary | ICD-10-CM | POA: Insufficient documentation

## 2017-02-28 LAB — BASIC METABOLIC PANEL
Anion gap: 11 (ref 5–15)
BUN: 18 mg/dL (ref 6–20)
CO2: 35 mmol/L — ABNORMAL HIGH (ref 22–32)
CREATININE: 1.54 mg/dL — AB (ref 0.61–1.24)
Calcium: 8.7 mg/dL — ABNORMAL LOW (ref 8.9–10.3)
Chloride: 91 mmol/L — ABNORMAL LOW (ref 101–111)
GFR calc non Af Amer: 50 mL/min — ABNORMAL LOW (ref >60)
GFR, EST AFRICAN AMERICAN: 58 mL/min — AB (ref >60)
Glucose, Bld: 112 mg/dL — ABNORMAL HIGH (ref 65–99)
POTASSIUM: 3.6 mmol/L (ref 3.5–5.1)
SODIUM: 137 mmol/L (ref 135–145)

## 2017-02-28 LAB — BRAIN NATRIURETIC PEPTIDE: B Natriuretic Peptide: 22.7 pg/mL (ref 0.0–100.0)

## 2017-02-28 MED ORDER — FUROSEMIDE 40 MG PO TABS: 60.0000 mg | ORAL_TABLET | Freq: Two times a day (BID) | ORAL | 3 refills | Status: DC

## 2017-02-28 MED ORDER — POTASSIUM CHLORIDE CRYS ER 20 MEQ PO TBCR: 40.0000 meq | EXTENDED_RELEASE_TABLET | Freq: Every day | ORAL | 3 refills | Status: DC

## 2017-02-28 NOTE — Patient Instructions (Signed)
Increase 60 mg (1.5 tab), twice a day  Potassium has been refilled  Labs drawn today (if we do not call you, then your lab work was stable)   Your physician recommends that you return for lab work in: 10 days   Call if you would like to join [employer] weight loss program  Your physician recommends that you schedule a follow-up appointment in: 3 months

## 2017-02-28 NOTE — Progress Notes (Signed)
PCP: O'Buch Pulmonary: Dr. [name] Cardiology: Dr. 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  He has been followed in [facility] in the past for cardiology. Dr. [name] has been following him for pulmonary fibrosis. He had ARDs twice in 2016, once 1/16-2/16.  The second time, he had PNA developing into ARDS and was in the hospital for 3 months.  High resolution CT done 2/18 showed pulmonary fibrosis.  He is on home oxygen during the day and CPAP (for OSA) at night.  He is on prednisone 5 mg daily for pulmonary fibrosis.   I took him for RHC in 12/17.  This showed mildly elevated filling pressures and pulmonary venous hypertension.   He has also had atrial arrhythmias, both atrial fibrillation and atrial flutter.  He has been cardioverted several times.  He has been on ranolazine and Multaq, but his insurance company said that it would not cover Multaq any longer and he stopped it.  He had DCCV to NSR in 3/18 but was back in atrial fibrillation in 5/18.  I had him evaluated by EP and he was admitted for dofetilide load.  Now on dofetilide and in NSR.    He feels better in NSR.  Unfortunately, weight is up about 18 lbs since I last saw him.  He is not very active due to his baseline dyspnea. He does not really watch what he eats.  He is still short of breath with exertion but able to go longer distances in NSR.  No chest pain.  No BRBPR/melena.   ECG (personally reviewed): NSR, QTc 486 msec  Labs (12/17): K 4.6, creatinine 1.98, hgb 11.5 Labs (3/18): K 4.2, creatinine 1.79, BNP 53, hgb 13.4 Labs (4/18): K 4.5, creatinine 1.8 Labs (5/18): K 4.2, creatinine 1.77, Mg 1.9 Labs (6/18): K 3.9, creatinine 1.5  PMH: 1. PUD 2. GERD 3. OSA: Using CPAP.  4. ARDS: 1/16-2/16 initially, then admitted again in 9/16 for 3 months with PNA  and ARDS.  He developed post-inflammatory pulmonary fibrosis. 5. 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  - High resolution CT chest (2/18): Pulmonary fibrosis noted, looks like NSIP versus chronic hypersensitivity pneumonitis.  - On home oxygen.  6. Ascending aortic aneurysm:  4.6 cm on CT in 2/18.  7. Atrial arrhythmias: Atrial fibrillation and atrial flutter have both been noted.  He has had several cardioversions in the past.  Was followed at [facility], was on ranolazine + Multaq, but insurance will not cover Multaq.  - DCCV 3/18 to NSR, back in atrial fibrillation by 5/18.  - Dofetilide begun, now in NSR.  8. Cardiomyopathy with chronic primarily diastolic CHF:  - Echo in [place] in the past with EF in 45% range.  - Echo (9/16) with EF 50-55%, moderate biatrial enlargement.  - RHC (12/17): mean 12, PA 44/24 mean 33, mean PCWP 22, CI 3.04, PVR 1.3 WU. - Echo (3/18): EF 50%, diffuse hypokinesis, normal RV size and systolic function.  9. CKD: Stage III.   SH: Nonsmoker, no ETOH.  He used to be an Personnel officer, now on disability.  Lives in [place].   Family History  Problem Relation Age of Onset  . Heart disease Mother   . Diabetes Mother   . Hypertension Mother    ROS: All systems reviewed and negative except as  per HPI.   Current Outpatient Prescriptions  Medication Sig Dispense Refill  . dofetilide (TIKOSYN) 500 MCG capsule Take 1 capsule (500 mcg total) by mouth every 12 (twelve) hours. 180 capsule 3  . famotidine (PEPCID) 20 MG tablet Take 1 tablet (20 mg total) by mouth 2 (two) times daily. 60 tablet 1  . Febuxostat (ULORIC) 80 MG TABS Take 80 mg by mouth daily.     . fenofibrate (TRICOR) 145 MG tablet Take 1 tablet (145 mg total) by mouth daily. 30 tablet 1  . furosemide (LASIX) 40 MG tablet Take 1.5 tablets (60 mg total) by mouth 2 (two) times daily. 90 tablet 3  . HYDROcodone-acetaminophen (NORCO) 10-325 MG  tablet Take 1 tablet by mouth 4 (four) times daily.    Marland Kitchen omeprazole (PRILOSEC) 20 MG capsule Take 20 mg by mouth 2 (two) times daily.    . OXYGEN Inhale 7 L into the lungs continuous.    . potassium chloride SA (K-DUR,KLOR-CON) 20 MEQ tablet Take 2 tablets (40 mEq total) by mouth daily. 60 tablet 3  . predniSONE (DELTASONE) 10 MG tablet Take 0.5 tablets (5 mg total) by mouth daily with breakfast. 30 tablet 2  . pregabalin (LYRICA) 200 MG capsule Take 1 capsule (200 mg total) by mouth 3 (three) times daily. Take 1 capsule three times a day. 90 capsule 0  . ranolazine (RANEXA) 1000 MG SR tablet Take 1 tablet (1,000 mg total) by mouth 2 (two) times daily. 60 tablet 6  . rivaroxaban (XARELTO) 20 MG TABS tablet Take 20 mg by mouth daily.     Marland Kitchen Specialty Vitamins Products (MAGNESIUM, AMINO ACID CHELATE,) 133 MG tablet Take 1 tablet by mouth daily.    Marland Kitchen spironolactone (ALDACTONE) 25 MG tablet Take 25 mg by mouth daily.     Marland Kitchen testosterone cypionate (DEPOTESTOSTERONE CYPIONATE) 200 MG/ML injection Inject 200 mg into the muscle every Wednesday.      No current facility-administered medications for this encounter.    BP 131/74   Pulse 64   Wt (!) 355 lb (161 kg)   SpO2 97% Comment: on 7L of O2  BMI 48.15 kg/m  General: NAD Neck: JVP 8-9 cm, no thyromegaly or thyroid nodule.  Lungs: Clear to auscultation bilaterally with normal respiratory effort. CV: Nondisplaced PMI.  Heart regular S1/S2, no S3/S4, no murmur.  1+ ankle edema.  No carotid bruit.  Normal pedal pulses.  Abdomen: Soft, nontender, no hepatosplenomegaly, no distention.  Skin: Intact without lesions or rashes.  Neurologic: Alert and oriented x 3.  Psych: Normal affect. Extremities: No clubbing or cyanosis.  HEENT: Normal.   Assessment/Plan: 1. Pulmonary fibrosis: Noted on HRCT.  He is on home oxygen. He is on prednisone 5 mg daily.  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  - Dr. [name] following for this.  2. Atrial  fibrillation and flutter: Patient was cardioverted in 3/18 to NSR but was back in atrial fibrillation by 5/18.  Now he has been started on dofetilide and is in NSR.  - Continue dofetilide, ECG looks ok today.  Check BMET and Mg. - Continue Xarelto, check CBC.  - He is on ranolazine in addition to dofetilide, per Dr. Graciela Husbands this will be ok to continue as long as QT interval is not excessively prolonged (486 msec today).   3. Chronic primarily diastolic CHF: Diastolic CHF with pulmonary venous hypertension on 12/17 echo.  NYHA class III symptoms (likely combination of heart and lungs as cause).  Most recent echo in 3/18 with  EF 50% with relatively normal-appearing RV. He looks volume overloaded today and weight is up.  - Increase Lasix from 40 mg bid to 60 mg bid. BMET/BNP today, repeat BMET in 10 days.  - KCl 40 mEq daily.  4. CKD: Stage III.  BMET today.  5. Obesity: I mentioned the [employer] weight loss program to him, he will consider. I think weight loss is imperative for him.   Marca Ancona 02/28/2017

## 2017-03-10 ENCOUNTER — Ambulatory Visit (HOSPITAL_COMMUNITY)
Admission: RE | Admit: 2017-03-10 | Discharge: 2017-03-10 | Disposition: A | Discharge status: Home / Self Care | Payer: Medicaid Other | Source: Ambulatory Visit | Attending: Cardiology | Admitting: Cardiology

## 2017-03-10 ENCOUNTER — Telehealth (HOSPITAL_COMMUNITY): Payer: Self-pay

## 2017-03-10 DIAGNOSIS — I5032 Chronic diastolic (congestive) heart failure: Secondary | ICD-10-CM | POA: Diagnosis not present

## 2017-03-10 LAB — BASIC METABOLIC PANEL
Anion gap: 10 (ref 5–15)
BUN: 20 mg/dL (ref 6–20)
CO2: 30 mmol/L (ref 22–32)
Calcium: 9.1 mg/dL (ref 8.9–10.3)
Chloride: 96 mmol/L — ABNORMAL LOW (ref 101–111)
Creatinine, Ser: 1.77 mg/dL — ABNORMAL HIGH (ref 0.61–1.24)
GFR calc non Af Amer: 42 mL/min — ABNORMAL LOW (ref >60)
GFR, EST AFRICAN AMERICAN: 48 mL/min — AB (ref >60)
Glucose, Bld: 144 mg/dL — ABNORMAL HIGH (ref 65–99)
POTASSIUM: 3.8 mmol/L (ref 3.5–5.1)
Sodium: 136 mmol/L (ref 135–145)

## 2017-03-10 NOTE — Telephone Encounter (Signed)
CHF Clinic appointment reminder call placed to patient for upcoming lab appointment.  LVMTCB to confirm apt.  Patient also reminded to take all medications as prescribed on the day of his/her appointment and to bring all medications to this appointment.  Advised to call our office for tardiness or cancellations/rescheduling needs.  Adam Benson, Bettina Gavia

## 2017-05-25 ENCOUNTER — Ambulatory Visit (HOSPITAL_COMMUNITY)
Admission: RE | Admit: 2017-05-25 | Discharge: 2017-05-25 | Disposition: A | Discharge status: Home / Self Care | Payer: Medicaid Other | Source: Ambulatory Visit | Attending: Nurse Practitioner | Admitting: Nurse Practitioner

## 2017-05-25 ENCOUNTER — Encounter (HOSPITAL_COMMUNITY): Payer: Self-pay | Admitting: Nurse Practitioner

## 2017-05-25 ENCOUNTER — Other Ambulatory Visit: Payer: Self-pay

## 2017-05-25 VITALS — BP 140/82 | HR 64 | Ht 72.0 in | Wt 354.0 lb

## 2017-05-25 DIAGNOSIS — E785 Hyperlipidemia, unspecified: Secondary | ICD-10-CM | POA: Diagnosis not present

## 2017-05-25 DIAGNOSIS — I110 Hypertensive heart disease with heart failure: Secondary | ICD-10-CM | POA: Diagnosis not present

## 2017-05-25 DIAGNOSIS — J80 Acute respiratory distress syndrome: Secondary | ICD-10-CM | POA: Diagnosis not present

## 2017-05-25 DIAGNOSIS — I5032 Chronic diastolic (congestive) heart failure: Secondary | ICD-10-CM | POA: Insufficient documentation

## 2017-05-25 DIAGNOSIS — I481 Persistent atrial fibrillation: Secondary | ICD-10-CM | POA: Diagnosis not present

## 2017-05-25 DIAGNOSIS — Z79899 Other long term (current) drug therapy: Secondary | ICD-10-CM | POA: Insufficient documentation

## 2017-05-25 DIAGNOSIS — K219 Gastro-esophageal reflux disease without esophagitis: Secondary | ICD-10-CM | POA: Diagnosis not present

## 2017-05-25 DIAGNOSIS — G4700 Insomnia, unspecified: Secondary | ICD-10-CM | POA: Insufficient documentation

## 2017-05-25 DIAGNOSIS — J8410 Pulmonary fibrosis, unspecified: Secondary | ICD-10-CM | POA: Diagnosis not present

## 2017-05-25 DIAGNOSIS — Z880 Allergy status to penicillin: Secondary | ICD-10-CM | POA: Insufficient documentation

## 2017-05-25 DIAGNOSIS — M109 Gout, unspecified: Secondary | ICD-10-CM | POA: Insufficient documentation

## 2017-05-25 DIAGNOSIS — Z7952 Long term (current) use of systemic steroids: Secondary | ICD-10-CM | POA: Insufficient documentation

## 2017-05-25 DIAGNOSIS — I4819 Other persistent atrial fibrillation: Secondary | ICD-10-CM

## 2017-05-25 DIAGNOSIS — Z9981 Dependence on supplemental oxygen: Secondary | ICD-10-CM | POA: Diagnosis not present

## 2017-05-25 DIAGNOSIS — Z7901 Long term (current) use of anticoagulants: Secondary | ICD-10-CM | POA: Insufficient documentation

## 2017-05-25 DIAGNOSIS — G4733 Obstructive sleep apnea (adult) (pediatric): Secondary | ICD-10-CM | POA: Diagnosis not present

## 2017-05-25 LAB — BASIC METABOLIC PANEL
ANION GAP: 11 (ref 5–15)
BUN: 22 mg/dL — ABNORMAL HIGH (ref 6–20)
CALCIUM: 8.7 mg/dL — AB (ref 8.9–10.3)
CO2: 33 mmol/L — AB (ref 22–32)
Chloride: 93 mmol/L — ABNORMAL LOW (ref 101–111)
Creatinine, Ser: 2.01 mg/dL — ABNORMAL HIGH (ref 0.61–1.24)
GFR, EST AFRICAN AMERICAN: 42 mL/min — AB (ref >60)
GFR, EST NON AFRICAN AMERICAN: 36 mL/min — AB (ref >60)
Glucose, Bld: 107 mg/dL — ABNORMAL HIGH (ref 65–99)
Potassium: 4.3 mmol/L (ref 3.5–5.1)
Sodium: 137 mmol/L (ref 135–145)

## 2017-05-25 LAB — MAGNESIUM: MAGNESIUM: 2.1 mg/dL (ref 1.7–2.4)
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
## 2017-05-26 ENCOUNTER — Other Ambulatory Visit (HOSPITAL_COMMUNITY): Payer: Self-pay | Admitting: *Deleted

## 2017-05-26 DIAGNOSIS — I5032 Chronic diastolic (congestive) heart failure: Secondary | ICD-10-CM

## 2017-05-26 MED ORDER — FUROSEMIDE 40 MG PO TABS: 40.0000 mg | ORAL_TABLET | Freq: Two times a day (BID) | ORAL | 3 refills | Status: DC

## 2017-06-01 ENCOUNTER — Ambulatory Visit (HOSPITAL_COMMUNITY)
Admission: RE | Admit: 2017-06-01 | Discharge: 2017-06-01 | Disposition: A | Discharge status: Home / Self Care | Payer: Medicaid Other | Source: Ambulatory Visit | Attending: Cardiology | Admitting: Cardiology

## 2017-06-01 ENCOUNTER — Encounter (HOSPITAL_COMMUNITY): Payer: Self-pay | Admitting: Cardiology

## 2017-06-01 VITALS — BP 126/71 | HR 78 | Wt 356.0 lb

## 2017-06-01 DIAGNOSIS — Z9981 Dependence on supplemental oxygen: Secondary | ICD-10-CM | POA: Diagnosis not present

## 2017-06-01 DIAGNOSIS — I130 Hypertensive heart and chronic kidney disease with heart failure and stage 1 through stage 4 chronic kidney disease, or unspecified chronic kidney disease: Secondary | ICD-10-CM | POA: Diagnosis not present

## 2017-06-01 DIAGNOSIS — I712 Thoracic aortic aneurysm, without rupture: Secondary | ICD-10-CM | POA: Insufficient documentation

## 2017-06-01 DIAGNOSIS — I4892 Unspecified atrial flutter: Secondary | ICD-10-CM | POA: Insufficient documentation

## 2017-06-01 DIAGNOSIS — Z79899 Other long term (current) drug therapy: Secondary | ICD-10-CM | POA: Diagnosis not present

## 2017-06-01 DIAGNOSIS — Z8711 Personal history of peptic ulcer disease: Secondary | ICD-10-CM | POA: Insufficient documentation

## 2017-06-01 DIAGNOSIS — G4733 Obstructive sleep apnea (adult) (pediatric): Secondary | ICD-10-CM | POA: Diagnosis not present

## 2017-06-01 DIAGNOSIS — I4819 Other persistent atrial fibrillation: Secondary | ICD-10-CM

## 2017-06-01 DIAGNOSIS — I5032 Chronic diastolic (congestive) heart failure: Secondary | ICD-10-CM | POA: Diagnosis not present

## 2017-06-01 DIAGNOSIS — I481 Persistent atrial fibrillation: Secondary | ICD-10-CM | POA: Insufficient documentation

## 2017-06-01 DIAGNOSIS — E669 Obesity, unspecified: Secondary | ICD-10-CM | POA: Diagnosis not present

## 2017-06-01 DIAGNOSIS — I429 Cardiomyopathy, unspecified: Secondary | ICD-10-CM | POA: Insufficient documentation

## 2017-06-01 DIAGNOSIS — Z7901 Long term (current) use of anticoagulants: Secondary | ICD-10-CM | POA: Insufficient documentation

## 2017-06-01 DIAGNOSIS — K219 Gastro-esophageal reflux disease without esophagitis: Secondary | ICD-10-CM | POA: Insufficient documentation

## 2017-06-01 DIAGNOSIS — N183 Chronic kidney disease, stage 3 (moderate): Secondary | ICD-10-CM | POA: Diagnosis not present

## 2017-06-01 DIAGNOSIS — J8410 Pulmonary fibrosis, unspecified: Secondary | ICD-10-CM | POA: Insufficient documentation

## 2017-06-01 LAB — BASIC METABOLIC PANEL
Anion gap: 10 (ref 5–15)
BUN: 15 mg/dL (ref 6–20)
CHLORIDE: 95 mmol/L — AB (ref 101–111)
CO2: 33 mmol/L — ABNORMAL HIGH (ref 22–32)
Calcium: 9 mg/dL (ref 8.9–10.3)
Creatinine, Ser: 1.78 mg/dL — ABNORMAL HIGH (ref 0.61–1.24)
GFR calc Af Amer: 48 mL/min — ABNORMAL LOW (ref >60)
GFR calc non Af Amer: 42 mL/min — ABNORMAL LOW (ref >60)
GLUCOSE: 95 mg/dL (ref 65–99)
POTASSIUM: 4.3 mmol/L (ref 3.5–5.1)
Sodium: 138 mmol/L (ref 135–145)

## 2017-06-01 MED ORDER — FUROSEMIDE 40 MG PO TABS: 40.0000 mg | ORAL_TABLET | Freq: Two times a day (BID) | ORAL | 3 refills | Status: DC

## 2017-06-01 NOTE — Progress Notes (Signed)
PCP: O'Buch Pulmonary: Dr. [name] Cardiology: Dr. Shirlee Latch  57 y.o. 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  He was followed in [facility] in the past for cardiology. Dr. [name] has been following him for pulmonary fibrosis. He had ARDs twice in 2016, once 1/16-2/16.  The second time, he had PNA developing into ARDS and was in the hospital for 3 months.  High resolution CT done 2/18 showed pulmonary fibrosis.  He is on home oxygen during the day and CPAP (for OSA) at night.  He is on prednisone 5 mg daily for pulmonary fibrosis.   I took him for RHC in 12/17.  This showed mildly elevated filling pressures and pulmonary venous hypertension.   He has also had atrial arrhythmias, both atrial fibrillation and atrial flutter.  He has been cardioverted several times.  He has been on ranolazine and Multaq, but his insurance company said that it would not cover Multaq any longer and he stopped it.  He had DCCV to NSR in 3/18 but was back in atrial fibrillation in 5/18.  I had him evaluated by EP and he was admitted for dofetilide load.  Now on dofetilide and in NSR.    He feels better overall in NSR. No palpitations.  He is short of breath walking < 1 block and wears oxygen at all times.  No chest pain. No PND.  Weight is stable.  He has been out of Lasix for a week and can tell that he has built up fluid.   ECG (1/19): NSR, normal (QTc 451 msec)  Labs (12/17): K 4.6, creatinine 1.98, hgb 11.5 Labs (3/18): K 4.2, creatinine 1.79, BNP 53, hgb 13.4 Labs (4/18): K 4.5, creatinine 1.8 Labs (5/18): K 4.2, creatinine 1.77, Mg 1.9 Labs (6/18): K 3.9, creatinine 1.5 Labs (12/18): K 4.3, creatinine 2.01  PMH: 1. PUD 2. GERD 3. OSA: Using CPAP.  4. ARDS: 1/16-2/16 initially, then admitted again in 9/16 for 3 months with PNA and ARDS.   He developed post-inflammatory pulmonary fibrosis. 5. 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  - High resolution CT chest (2/18): Pulmonary fibrosis noted, looks like NSIP versus chronic hypersensitivity pneumonitis.  - On home oxygen.  6. Ascending aortic aneurysm:  4.6 cm on CT in 2/18.  7. Atrial arrhythmias: Atrial fibrillation and atrial flutter have both been noted.  He has had several cardioversions in the past.  Was followed at [facility], was on ranolazine + Multaq, but insurance will not cover Multaq.  - DCCV 3/18 to NSR, back in atrial fibrillation by 5/18.  - Dofetilide begun, now in NSR.  8. Cardiomyopathy with chronic primarily diastolic CHF:  - Echo in [place] in the past with EF in 45% range.  - Echo (9/16) with EF 50-55%, moderate biatrial enlargement.  - RHC (12/17): mean 12, PA 44/24 mean 33, mean PCWP 22, CI 3.04, PVR 1.3 WU. - Echo (3/18): EF 50%, diffuse hypokinesis, normal RV size and systolic function.  9. CKD: Stage III.   SH: Nonsmoker, no ETOH.  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   Family History  Problem Relation Age of Onset  . Heart disease Mother   . Diabetes Mother   . Hypertension Mother    ROS: All systems reviewed and negative except as  per HPI.   Current Outpatient Medications  Medication Sig Dispense Refill  . dofetilide (TIKOSYN) 500 MCG capsule Take 1 capsule (500 mcg total) by mouth every 12 (twelve) hours. 180 capsule 3  . famotidine (PEPCID) 20 MG tablet Take 1 tablet (20 mg total) by mouth 2 (two) times daily. 60 tablet 1  . Febuxostat (ULORIC) 80 MG TABS Take 80 mg by mouth daily.     . fenofibrate (TRICOR) 145 MG tablet Take 1 tablet (145 mg total) by mouth daily. 30 tablet 1  . furosemide (LASIX) 40 MG tablet Take 1 tablet (40 mg total) by mouth 2 (two) times daily. 90 tablet 3  . HYDROcodone-acetaminophen (NORCO) 10-325 MG tablet Take 1  tablet by mouth 4 (four) times daily.    Marland Kitchen omeprazole (PRILOSEC) 20 MG capsule Take 20 mg by mouth 2 (two) times daily.    . OXYGEN Inhale 7 L into the lungs continuous.    . potassium chloride SA (K-DUR,KLOR-CON) 20 MEQ tablet Take 2 tablets (40 mEq total) by mouth daily. 60 tablet 3  . predniSONE (DELTASONE) 10 MG tablet Take 0.5 tablets (5 mg total) by mouth daily with breakfast. 30 tablet 2  . pregabalin (LYRICA) 200 MG capsule Take 1 capsule (200 mg total) by mouth 3 (three) times daily. Take 1 capsule three times a day. 90 capsule 0  . ranolazine (RANEXA) 1000 MG SR tablet Take 1 tablet (1,000 mg total) by mouth 2 (two) times daily. 60 tablet 6  . rivaroxaban (XARELTO) 20 MG TABS tablet Take 20 mg by mouth daily.     Marland Kitchen Specialty Vitamins Products (MAGNESIUM, AMINO ACID CHELATE,) 133 MG tablet Take 1 tablet by mouth daily.    Marland Kitchen spironolactone (ALDACTONE) 25 MG tablet Take 25 mg by mouth daily.     Marland Kitchen testosterone cypionate (DEPOTESTOSTERONE CYPIONATE) 200 MG/ML injection Inject 200 mg into the muscle every Wednesday.      No current facility-administered medications for this encounter.    BP 126/71 (BP Location: Left Arm, Patient Position: Sitting, Cuff Size: Large)   Pulse 78   Wt (!) 356 lb (161.5 kg)   SpO2 98% Comment: on 2L  BMI 48.28 kg/m  General: NAD, obese Neck: Thick, JVP 8-9 cm, no thyromegaly or thyroid nodule.  Lungs: Dry crackles dependently. CV: Nondisplaced PMI.  Heart regular S1/S2, no S3/S4, no murmur.  1+ edema 1/3 to knees bilaerally.  No carotid bruit.  Normal pedal pulses.  Abdomen: Soft, nontender, no hepatosplenomegaly, no distention.  Skin: Intact without lesions or rashes.  Neurologic: Alert and oriented x 3.  Psych: Normal affect. Extremities: No clubbing or cyanosis.  HEENT: Normal.   Assessment/Plan: 1. Pulmonary fibrosis: Noted on HRCT.  He is on home oxygen. He is on prednisone 5 mg daily.  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  - Dr.  [name] following for this.  2. Atrial fibrillation and flutter: Patient was cardioverted in 3/18 to NSR but was back in atrial fibrillation by 5/18.  Now he has been started on dofetilide and is in NSR.  - Continue dofetilide, ECG looks ok today.  BMET today. - Continue Xarelto.  - He is on ranolazine in addition to dofetilide, per Dr. Graciela Husbands this will be ok to continue as long as QT interval is not excessively prolonged (452 msec today).   3. Chronic primarily diastolic CHF: Diastolic CHF with pulmonary venous hypertension on 12/17 echo.  NYHA class III symptoms (likely combination of heart and lungs as cause).  Most recent  echo in 3/18 with EF 50% with relatively normal-appearing RV. He has been out of Lasix x 1 week and is at least mildly volume overloaded.  - Lasix 60 mg bid x 3 days then 40 mg bid.  BMET in 2 wks.  - KCl 40 mEq daily.  4. CKD: Stage III.  BMET today.  5. Obesity:  I think weight loss is imperative for him.   Followup in 3 months.  Marca Ancona 06/01/2017

## 2017-06-01 NOTE — Patient Instructions (Addendum)
Take Furosemide 60 mg (1.5 tabs), twice a day for 3 days  -Then go back to taking 40 mg (1 tab), twice a day  Labs drawn today (if we do not call you, then your lab work was stable)   Your physician recommends that you return for lab work in: 10 days  Your physician recommends that you schedule a follow-up appointment in: 3 months with Dr. Shirlee Latch  (we will call you)

## 2017-06-02 ENCOUNTER — Encounter (HOSPITAL_COMMUNITY): Payer: Self-pay

## 2017-06-02 ENCOUNTER — Encounter (HOSPITAL_COMMUNITY): Payer: Self-pay | Admitting: *Deleted

## 2017-06-12 ENCOUNTER — Ambulatory Visit (HOSPITAL_COMMUNITY)
Admission: RE | Admit: 2017-06-12 | Discharge: 2017-06-12 | Disposition: A | Discharge status: Home / Self Care | Payer: Medicaid Other | Source: Ambulatory Visit | Attending: Cardiology | Admitting: Cardiology

## 2017-06-12 ENCOUNTER — Other Ambulatory Visit (HOSPITAL_COMMUNITY): Payer: Self-pay | Admitting: *Deleted

## 2017-06-12 DIAGNOSIS — I5032 Chronic diastolic (congestive) heart failure: Secondary | ICD-10-CM | POA: Diagnosis not present

## 2017-06-12 LAB — BASIC METABOLIC PANEL
ANION GAP: 12 (ref 5–15)
BUN: 12 mg/dL (ref 6–20)
CO2: 32 mmol/L (ref 22–32)
Calcium: 8.6 mg/dL — ABNORMAL LOW (ref 8.9–10.3)
Chloride: 96 mmol/L — ABNORMAL LOW (ref 101–111)
Creatinine, Ser: 1.42 mg/dL — ABNORMAL HIGH (ref 0.61–1.24)
GFR calc Af Amer: >60 mL/min (ref >60)
GFR, EST NON AFRICAN AMERICAN: 55 mL/min — AB (ref >60)
GLUCOSE: 105 mg/dL — AB (ref 65–99)
POTASSIUM: 3.8 mmol/L (ref 3.5–5.1)
Sodium: 140 mmol/L (ref 135–145)

## 2017-06-12 MED ORDER — POTASSIUM CHLORIDE CRYS ER 20 MEQ PO TBCR: 40.0000 meq | EXTENDED_RELEASE_TABLET | Freq: Every day | ORAL | 3 refills | Status: DC

## 2017-07-03 ENCOUNTER — Other Ambulatory Visit (HOSPITAL_COMMUNITY): Payer: Self-pay | Admitting: *Deleted

## 2017-07-03 DIAGNOSIS — I5032 Chronic diastolic (congestive) heart failure: Secondary | ICD-10-CM

## 2017-07-03 MED ORDER — FUROSEMIDE 40 MG PO TABS: 40.0000 mg | ORAL_TABLET | Freq: Two times a day (BID) | ORAL | 3 refills | Status: DC

## 2017-07-03 MED ORDER — RANOLAZINE ER 1000 MG PO TB12: 1000.0000 mg | ORAL_TABLET | Freq: Two times a day (BID) | ORAL | 6 refills | Status: DC

## 2017-07-19 ENCOUNTER — Other Ambulatory Visit (HOSPITAL_COMMUNITY): Payer: Self-pay | Admitting: Cardiology

## 2017-07-19 DIAGNOSIS — I5032 Chronic diastolic (congestive) heart failure: Secondary | ICD-10-CM

## 2017-07-19 MED ORDER — FUROSEMIDE 40 MG PO TABS: 40.0000 mg | ORAL_TABLET | Freq: Two times a day (BID) | ORAL | 3 refills | Status: DC

## 2017-08-21 ENCOUNTER — Other Ambulatory Visit: Payer: Self-pay | Admitting: Internal Medicine

## 2017-08-23 ENCOUNTER — Ambulatory Visit (HOSPITAL_COMMUNITY): Payer: Medicaid Other | Admitting: Nurse Practitioner

## 2017-08-24 ENCOUNTER — Encounter (HOSPITAL_COMMUNITY): Payer: Self-pay | Admitting: Nurse Practitioner

## 2017-08-24 ENCOUNTER — Ambulatory Visit (HOSPITAL_COMMUNITY)
Admission: RE | Admit: 2017-08-24 | Discharge: 2017-08-24 | Disposition: A | Discharge status: Home / Self Care | Payer: Medicaid Other | Source: Ambulatory Visit | Attending: Nurse Practitioner | Admitting: Nurse Practitioner

## 2017-08-24 VITALS — BP 134/72 | HR 83 | Ht 72.0 in | Wt 361.0 lb

## 2017-08-24 DIAGNOSIS — Z880 Allergy status to penicillin: Secondary | ICD-10-CM | POA: Insufficient documentation

## 2017-08-24 DIAGNOSIS — K219 Gastro-esophageal reflux disease without esophagitis: Secondary | ICD-10-CM | POA: Insufficient documentation

## 2017-08-24 DIAGNOSIS — I5032 Chronic diastolic (congestive) heart failure: Secondary | ICD-10-CM | POA: Diagnosis not present

## 2017-08-24 DIAGNOSIS — Z7952 Long term (current) use of systemic steroids: Secondary | ICD-10-CM | POA: Diagnosis not present

## 2017-08-24 DIAGNOSIS — Z7901 Long term (current) use of anticoagulants: Secondary | ICD-10-CM | POA: Insufficient documentation

## 2017-08-24 DIAGNOSIS — I481 Persistent atrial fibrillation: Secondary | ICD-10-CM | POA: Diagnosis not present

## 2017-08-24 DIAGNOSIS — M109 Gout, unspecified: Secondary | ICD-10-CM | POA: Diagnosis not present

## 2017-08-24 DIAGNOSIS — Z9981 Dependence on supplemental oxygen: Secondary | ICD-10-CM | POA: Insufficient documentation

## 2017-08-24 DIAGNOSIS — I272 Pulmonary hypertension, unspecified: Secondary | ICD-10-CM | POA: Diagnosis not present

## 2017-08-24 DIAGNOSIS — J8410 Pulmonary fibrosis, unspecified: Secondary | ICD-10-CM | POA: Insufficient documentation

## 2017-08-24 DIAGNOSIS — G4733 Obstructive sleep apnea (adult) (pediatric): Secondary | ICD-10-CM | POA: Insufficient documentation

## 2017-08-24 DIAGNOSIS — E785 Hyperlipidemia, unspecified: Secondary | ICD-10-CM | POA: Diagnosis not present

## 2017-08-24 DIAGNOSIS — I110 Hypertensive heart disease with heart failure: Secondary | ICD-10-CM | POA: Diagnosis not present

## 2017-08-24 DIAGNOSIS — I480 Paroxysmal atrial fibrillation: Secondary | ICD-10-CM

## 2017-08-24 DIAGNOSIS — J80 Acute respiratory distress syndrome: Secondary | ICD-10-CM | POA: Insufficient documentation

## 2017-08-24 DIAGNOSIS — Z79899 Other long term (current) drug therapy: Secondary | ICD-10-CM | POA: Insufficient documentation

## 2017-08-24 DIAGNOSIS — I4891 Unspecified atrial fibrillation: Secondary | ICD-10-CM | POA: Diagnosis present

## 2017-08-24 DIAGNOSIS — G4700 Insomnia, unspecified: Secondary | ICD-10-CM | POA: Insufficient documentation

## 2017-08-24 DIAGNOSIS — E669 Obesity, unspecified: Secondary | ICD-10-CM | POA: Diagnosis not present

## 2017-08-24 LAB — BASIC METABOLIC PANEL
ANION GAP: 12 (ref 5–15)
BUN: 20 mg/dL (ref 6–20)
CALCIUM: 9.1 mg/dL (ref 8.9–10.3)
CO2: 32 mmol/L (ref 22–32)
Chloride: 95 mmol/L — ABNORMAL LOW (ref 101–111)
Creatinine, Ser: 1.77 mg/dL — ABNORMAL HIGH (ref 0.61–1.24)
GFR calc non Af Amer: 42 mL/min — ABNORMAL LOW (ref >60)
GFR, EST AFRICAN AMERICAN: 48 mL/min — AB (ref >60)
Glucose, Bld: 124 mg/dL — ABNORMAL HIGH (ref 65–99)
Potassium: 3.6 mmol/L (ref 3.5–5.1)
Sodium: 139 mmol/L (ref 135–145)

## 2017-08-24 LAB — MAGNESIUM: MAGNESIUM: 1.6 mg/dL — AB (ref 1.7–2.4)
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
## 2017-08-25 ENCOUNTER — Other Ambulatory Visit (HOSPITAL_COMMUNITY): Payer: Self-pay | Admitting: *Deleted

## 2017-08-25 DIAGNOSIS — I5032 Chronic diastolic (congestive) heart failure: Secondary | ICD-10-CM

## 2017-08-25 MED ORDER — POTASSIUM CHLORIDE CRYS ER 20 MEQ PO TBCR: EXTENDED_RELEASE_TABLET | ORAL | 3 refills | Status: DC

## 2017-08-25 MED ORDER — MAGNESIUM OXIDE 400 (241.3 MG) MG PO TABS: 200.0000 mg | ORAL_TABLET | Freq: Every day | ORAL | 3 refills | Status: DC

## 2017-10-03 ENCOUNTER — Ambulatory Visit (HOSPITAL_COMMUNITY)
Admission: RE | Admit: 2017-10-03 | Discharge: 2017-10-03 | Disposition: A | Discharge status: Home / Self Care | Payer: Medicaid Other | Source: Ambulatory Visit | Attending: Cardiology | Admitting: Cardiology

## 2017-10-03 ENCOUNTER — Other Ambulatory Visit: Payer: Self-pay

## 2017-10-03 ENCOUNTER — Encounter (HOSPITAL_COMMUNITY): Payer: Self-pay

## 2017-10-03 ENCOUNTER — Encounter (HOSPITAL_COMMUNITY): Payer: Self-pay | Admitting: Cardiology

## 2017-10-03 VITALS — BP 131/78 | HR 96 | Wt 350.0 lb

## 2017-10-03 DIAGNOSIS — Z79899 Other long term (current) drug therapy: Secondary | ICD-10-CM | POA: Insufficient documentation

## 2017-10-03 DIAGNOSIS — Z7901 Long term (current) use of anticoagulants: Secondary | ICD-10-CM | POA: Insufficient documentation

## 2017-10-03 DIAGNOSIS — Z7952 Long term (current) use of systemic steroids: Secondary | ICD-10-CM | POA: Insufficient documentation

## 2017-10-03 DIAGNOSIS — G4733 Obstructive sleep apnea (adult) (pediatric): Secondary | ICD-10-CM | POA: Insufficient documentation

## 2017-10-03 DIAGNOSIS — Q211 Atrial septal defect: Secondary | ICD-10-CM | POA: Diagnosis not present

## 2017-10-03 DIAGNOSIS — K219 Gastro-esophageal reflux disease without esophagitis: Secondary | ICD-10-CM | POA: Diagnosis not present

## 2017-10-03 DIAGNOSIS — N183 Chronic kidney disease, stage 3 (moderate): Secondary | ICD-10-CM | POA: Insufficient documentation

## 2017-10-03 DIAGNOSIS — I4892 Unspecified atrial flutter: Secondary | ICD-10-CM | POA: Insufficient documentation

## 2017-10-03 DIAGNOSIS — Z833 Family history of diabetes mellitus: Secondary | ICD-10-CM | POA: Diagnosis not present

## 2017-10-03 DIAGNOSIS — I712 Thoracic aortic aneurysm, without rupture: Secondary | ICD-10-CM | POA: Insufficient documentation

## 2017-10-03 DIAGNOSIS — Z9981 Dependence on supplemental oxygen: Secondary | ICD-10-CM | POA: Insufficient documentation

## 2017-10-03 DIAGNOSIS — Z8249 Family history of ischemic heart disease and other diseases of the circulatory system: Secondary | ICD-10-CM | POA: Insufficient documentation

## 2017-10-03 DIAGNOSIS — I429 Cardiomyopathy, unspecified: Secondary | ICD-10-CM | POA: Diagnosis not present

## 2017-10-03 DIAGNOSIS — I5032 Chronic diastolic (congestive) heart failure: Secondary | ICD-10-CM | POA: Diagnosis present

## 2017-10-03 DIAGNOSIS — I4891 Unspecified atrial fibrillation: Secondary | ICD-10-CM | POA: Insufficient documentation

## 2017-10-03 DIAGNOSIS — Z6841 Body Mass Index (BMI) 40.0 and over, adult: Secondary | ICD-10-CM | POA: Diagnosis not present

## 2017-10-03 DIAGNOSIS — I483 Typical atrial flutter: Secondary | ICD-10-CM | POA: Diagnosis not present

## 2017-10-03 DIAGNOSIS — E669 Obesity, unspecified: Secondary | ICD-10-CM | POA: Diagnosis not present

## 2017-10-03 DIAGNOSIS — J8410 Pulmonary fibrosis, unspecified: Secondary | ICD-10-CM | POA: Insufficient documentation

## 2017-10-03 DIAGNOSIS — Z8711 Personal history of peptic ulcer disease: Secondary | ICD-10-CM | POA: Diagnosis not present

## 2017-10-03 LAB — CBC
HEMATOCRIT: 49.5 % (ref 39.0–52.0)
Hemoglobin: 15 g/dL (ref 13.0–17.0)
MCH: 23.8 pg — ABNORMAL LOW (ref 26.0–34.0)
MCHC: 30.3 g/dL (ref 30.0–36.0)
MCV: 78.6 fL (ref 78.0–100.0)
PLATELETS: 260 10*3/uL (ref 150–400)
RBC: 6.3 MIL/uL — AB (ref 4.22–5.81)
RDW: 19.1 % — ABNORMAL HIGH (ref 11.5–15.5)
WBC: 10.3 10*3/uL (ref 4.0–10.5)

## 2017-10-03 LAB — BASIC METABOLIC PANEL
Anion gap: 14 (ref 5–15)
BUN: 16 mg/dL (ref 6–20)
CO2: 27 mmol/L (ref 22–32)
CREATININE: 1.8 mg/dL — AB (ref 0.61–1.24)
Calcium: 8.4 mg/dL — ABNORMAL LOW (ref 8.9–10.3)
Chloride: 99 mmol/L — ABNORMAL LOW (ref 101–111)
GFR calc non Af Amer: 41 mL/min — ABNORMAL LOW (ref >60)
GFR, EST AFRICAN AMERICAN: 48 mL/min — AB (ref >60)
Glucose, Bld: 189 mg/dL — ABNORMAL HIGH (ref 65–99)
POTASSIUM: 3.7 mmol/L (ref 3.5–5.1)
SODIUM: 140 mmol/L (ref 135–145)

## 2017-10-03 MED ORDER — METOPROLOL SUCCINATE ER 25 MG PO TB24: 25.0000 mg | ORAL_TABLET | Freq: Every day | ORAL | 3 refills | Status: AC

## 2017-10-03 NOTE — Patient Instructions (Signed)
Start Toprol XL 25 mg (1 tab) daily  Your physician has recommended that you have a Cardioversion (DCCV). Electrical Cardioversion uses a jolt of electricity to your heart either through paddles or wired patches attached to your chest. This is a controlled, usually prescheduled, procedure. Defibrillation is done under light anesthesia in the hospital, and you usually go home the day of the procedure. This is done to get your heart back into a normal rhythm. You are not awake for the procedure. Please see the instruction sheet given to you today.   You have been referred to A-fib in 1 month   Your physician recommends that you schedule a follow-up appointment in: 3 months with Dr. Shirlee Latch

## 2017-10-03 NOTE — Progress Notes (Signed)
Emeline Darling MD faxed notes from OV and Echo. Dr. Shirlee Latch  Reviewed with pt at OV today and no further orders.

## 2017-10-04 NOTE — Progress Notes (Addendum)
PCP: O[facility]Buch Pulmonary: Dr. [name] Cardiology: Dr. Shirlee Latch  56 y.o. 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  He was followed in [facility] in the past for cardiology. Dr. [name] has been following him for pulmonary fibrosis. He had ARDs twice in 2016, once 1/16-2/16.  The second time, he had PNA developing into ARDS and was in the hospital for 3 months.  High resolution CT done 2/18 showed pulmonary fibrosis.  He is on home oxygen during the day and CPAP (for OSA) at night.  He is on prednisone 5 mg daily for pulmonary fibrosis.   I took him for RHC in 12/17.  This showed mildly elevated filling pressures and pulmonary venous hypertension.   He has also had atrial arrhythmias, both atrial fibrillation and atrial flutter.  He has been cardioverted several times.  He has been on ranolazine and Multaq, but his insurance company said that it would not cover Multaq any longer and he stopped it.  He had DCCV to NSR in 3/18 but was back in atrial fibrillation in 5/18.  I had him evaluated by EP and he was admitted for dofetilide load.  Now on dofetilide.   He was seen by pulmonology at [facility] in 2/19.  Chest CT was done, per the pulmonology note, there was evidence of inflammation.  It was an unusual ILD pattern for post-ARDS fibrosis, there was concern for NSIP versus hypersensitivity pneumonitis. He had evidence for mold sensitization.  Echo in 3/19 showed +bubble study concerning for PFO (weakly positive), EF 45-50%, moderate LV dilation, mildly decreased RV systolic function.  Prednisone was increased and he was started on dapsone.    He has been feeling better since he started pulmonary rehab.  However, he is noted to be in atrial flutter today. He did not realize that he was out of rhythm.  He continues to wear oxygen. He is not  short of breath now walking on flat ground.  He gets short of breath walking up a hill.  He missed about 4 days of Xarelto 2 wks ago.   ECG (personally reviewed): atrial flutter (2:1) with QTc 478 msec  Labs (12/17): K 4.6, creatinine 1.98, hgb 11.5 Labs (3/18): K 4.2, creatinine 1.79, BNP 53, hgb 13.4 Labs (4/18): K 4.5, creatinine 1.8 Labs (5/18): K 4.2, creatinine 1.77, Mg 1.9 Labs (6/18): K 3.9, creatinine 1.5 Labs (12/18): K 4.3, creatinine 2.01 Labs (2/19): ANA negative, RF negative  PMH: 1. PUD 2. GERD 3. OSA: Using CPAP.  4. ARDS: 1/16-2/16 initially, then admitted again in 9/16 for 3 months with PNA and ARDS.  He developed post-inflammatory pulmonary fibrosis. 5. Pulmonary fibrosis: Thought initially to be post-inflammatory pulmonary fibrosis developing after PNA and ARDS.  - High resolution CT chest (2/18): Pulmonary fibrosis noted, looks like NSIP versus chronic hypersensitivity pneumonitis.  - On home oxygen.  - CT at [facility] in 2/19 was more suggestive of inflammation: It was an unusual ILD pattern for post-ARDS fibrosis, there was concern for NSIP versus hypersensitivity pneumonitis. 6. Ascending aortic aneurysm:  4.6 cm on CT in 2/18.  7. Atrial arrhythmias: Atrial fibrillation and atrial flutter have both been noted.  He has had several cardioversions in the past.  Was followed at [facility], was on ranolazine + Multaq, but insurance will not cover Multaq.  - DCCV 3/18 to NSR, back in atrial  fibrillation by 5/18.  - Dofetilide begun, now in NSR.  8. Cardiomyopathy with chronic primarily diastolic CHF:  - Echo in [place] in the past with EF in 45% range.  - Echo (9/16) with EF 50-55%, moderate biatrial enlargement.  - RHC (12/17): mean 12, PA 44/24 mean 33, mean PCWP 22, CI 3.04, PVR 1.3 WU. - Echo (3/18): EF 50%, diffuse hypokinesis, normal RV size and systolic function.  - Echo in 1/19 (at [facility]) showed +bubble study concerning for PFO (weakly positive), EF 45-50%,  moderate LV dilation, mildly decreased RV systolic function.   9. CKD: Stage III.   SH: Nonsmoker, no ETOH.  He used to be an Personnel officer, now on disability.  Lives in [place].   Family History  Problem Relation Age of Onset  . Heart disease Mother   . Diabetes Mother   . Hypertension Mother    ROS: All systems reviewed and negative except as per HPI.   Current Outpatient Medications  Medication Sig Dispense Refill  . dofetilide (TIKOSYN) 500 MCG capsule Take 1 capsule (500 mcg total) by mouth every 12 (twelve) hours. 180 capsule 3  . famotidine (PEPCID) 20 MG tablet Take 1 tablet (20 mg total) by mouth 2 (two) times daily. 60 tablet 1  . Febuxostat (ULORIC) 80 MG TABS Take 80 mg by mouth daily.     . fenofibrate (TRICOR) 145 MG tablet Take 1 tablet (145 mg total) by mouth daily. 30 tablet 1  . furosemide (LASIX) 40 MG tablet Take 1 tablet (40 mg total) by mouth 2 (two) times daily. 60 tablet 3  . HYDROcodone-acetaminophen (NORCO) 10-325 MG tablet Take 1 tablet by mouth 4 (four) times daily.    . magnesium oxide (MAGNESIUM-OXIDE) 400 (241.3 Mg) MG tablet Take 0.5 tablets (200 mg total) by mouth daily. 15 tablet 3  . omeprazole (PRILOSEC) 20 MG capsule Take 20 mg by mouth 2 (two) times daily.    . OXYGEN Inhale 7 L into the lungs continuous.    . potassium chloride SA (K-DUR,KLOR-CON) 20 MEQ tablet Take in the morning and in the evening. 90 tablet 3  . predniSONE (DELTASONE) 20 MG tablet Take 40 mg by mouth daily with breakfast.    . pregabalin (LYRICA) 200 MG capsule Take 1 capsule (200 mg total) by mouth 3 (three) times daily. Take 1 capsule three times a day. 90 capsule 0  . ranolazine (RANEXA) 1000 MG SR tablet Take 1 tablet (1,000 mg total) by mouth 2 (two) times daily. 60 tablet 6  . rivaroxaban (XARELTO) 20 MG TABS tablet Take 20 mg by mouth daily.     Marland Kitchen spironolactone (ALDACTONE) 25 MG tablet Take 25 mg by mouth daily.     Marland Kitchen testosterone cypionate  (DEPOTESTOSTERONE CYPIONATE) 200 MG/ML injection Inject 200 mg into the muscle every Wednesday.     . metoprolol succinate (TOPROL-XL) 25 MG 24 hr tablet Take 1 tablet (25 mg total) by mouth daily. Take with or immediately following a meal. 30 tablet 3   No current facility-administered medications for this encounter.    BP 131/78   Pulse 96   Wt (!) 350 lb (158.8 kg)   SpO2 96% Comment: on 7L of O2  BMI 47.47 kg/m  General: NAD, obese Neck: Thick, no JVD, no thyromegaly or thyroid nodule.  Lungs: Clear to auscultation bilaterally with normal respiratory effort. CV: Nondisplaced PMI.  Heart irregular S1/S2, no S3/S4, no murmur.  Trace ankle edema.  No carotid bruit.  Normal pedal pulses.  Abdomen: Soft, nontender, no hepatosplenomegaly, no distention.  Skin: Intact without lesions or rashes.  Neurologic: Alert and oriented x 3.  Psych: Normal affect. Extremities: No clubbing or cyanosis.  HEENT: Normal.   Assessment/Plan: 1. Pulmonary fibrosis: Noted on HRCT.  He is on home oxygen. Had suspected post-ARDS pulmonary fibrosis. Recently evaluated at [facility], where repeat CT showed evidence of more active inflammation with ?NSIP versus hypersensitivity pneumonitis.   - Prednisone was increased to 40 mg daily and he has started on dapsone.   2. Atrial fibrillation and flutter: Patient was cardioverted in 3/18 to NSR but was back in atrial fibrillation by 5/18.  He maintained NSR for almost a year on dofetilide but is back in atrial flutter today.   - Continue dofetilide, QTc ok today. - He is on ranolazine in addition to dofetilide, per Dr. Graciela Husbands this will be ok to continue as long as QT interval is not excessively prolonged.   - He missed about 4 days of Xarelto 2 wks ago.  I will have him continue Xarelto without missing it for 2 more days then will bring him in for DCCV.  I discussed risks/benefits with him and he agrees to proceed.  - Add Toprol XL 25 mg daily for rate control.  - CBC today  with Xarelto use.  3. Chronic primarily diastolic CHF: Diastolic CHF with pulmonary venous hypertension on 12/17 echo.  NYHA class II-III symptoms, some improvement with pulmonary rehab (likely combination of heart and lungs as cause).  Most recent echo in 3/19 at [facility] with EF 45-50%, mildly decreased RV systolic function. - Continue Lasix 40 mg bid, BMET today.  4. CKD: Stage III.  BMET today.  5. Obesity:  I think weight loss is imperative for him.  6. PFO: PFO noted on last echo with positive bubble study. Per report, it did not appear large.  Will follow without correction for now, I do not think that it is creating significant shunting.   Followup in 1 month in afib clinic and in 3 months with me.  Marca Ancona 10/04/2017

## 2017-10-04 NOTE — Addendum Note (Signed)
Encounter addended by: Laurey Morale, MD on: 10/04/2017 10:38 PM  Actions taken: Sign clinical note

## 2017-10-04 NOTE — H&P (View-Only) (Signed)
PCP: [facility]Buch Pulmonary: Dr. [name] Cardiology: Dr. Shirlee Latch  56 y.o. 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  He was followed in [facility] in the past for cardiology. Dr. [name] has been following him for pulmonary fibrosis. He had ARDs twice in 2016, once 1/16-2/16.  The second time, he had PNA developing into ARDS and was in the hospital for 3 months.  High resolution CT done 2/18 showed pulmonary fibrosis.  He is on home oxygen during the day and CPAP (for OSA) at night.  He is on prednisone 5 mg daily for pulmonary fibrosis.   I took him for RHC in 12/17.  This showed mildly elevated filling pressures and pulmonary venous hypertension.   He has also had atrial arrhythmias, both atrial fibrillation and atrial flutter.  He has been cardioverted several times.  He has been on ranolazine and Multaq, but his insurance company said that it would not cover Multaq any longer and he stopped it.  He had DCCV to NSR in 3/18 but was back in atrial fibrillation in 5/18.  I had him evaluated by EP and he was admitted for dofetilide load.  Now on dofetilide.   He was seen by pulmonology at [facility] in 2/19.  Chest CT was done, per the pulmonology note, there was evidence of inflammation.  It was an unusual ILD pattern for post-ARDS fibrosis, there was concern for NSIP versus hypersensitivity pneumonitis. He had evidence for mold sensitization.  Echo in 3/19 showed +bubble study concerning for PFO (weakly positive), EF 45-50%, moderate LV dilation, mildly decreased RV systolic function.  Prednisone was increased and he was started on dapsone.    He has been feeling better since he started pulmonary rehab.  However, he is noted to be in atrial flutter today. He did not realize that he was out of rhythm.  He continues to wear oxygen. He is not  short of breath now walking on flat ground.  He gets short of breath walking up a hill.  He missed about 4 days of Xarelto 2 wks ago.   ECG (personally reviewed): atrial flutter (2:1) with QTc 478 msec  Labs (12/17): K 4.6, creatinine 1.98, hgb 11.5 Labs (3/18): K 4.2, creatinine 1.79, BNP 53, hgb 13.4 Labs (4/18): K 4.5, creatinine 1.8 Labs (5/18): K 4.2, creatinine 1.77, Mg 1.9 Labs (6/18): K 3.9, creatinine 1.5 Labs (12/18): K 4.3, creatinine 2.01 Labs (2/19): ANA negative, RF negative  PMH: 1. PUD 2. GERD 3. OSA: Using CPAP.  4. ARDS: 1/16-2/16 initially, then admitted again in 9/16 for 3 months with PNA and ARDS.  He developed post-inflammatory pulmonary fibrosis. 5. Pulmonary fibrosis: Thought initially to be post-inflammatory pulmonary fibrosis developing after PNA and ARDS.  - High resolution CT chest (2/18): Pulmonary fibrosis noted, looks like NSIP versus chronic hypersensitivity pneumonitis.  - On home oxygen.  - CT at [facility] in 2/19 was more suggestive of inflammation: It was an unusual ILD pattern for post-ARDS fibrosis, there was concern for NSIP versus hypersensitivity pneumonitis. 6. Ascending aortic aneurysm:  4.6 cm on CT in 2/18.  7. Atrial arrhythmias: Atrial fibrillation and atrial flutter have both been noted.  He has had several cardioversions in the past.  Was followed at [facility], was on ranolazine + Multaq, but insurance will not cover Multaq.  - DCCV 3/18 to NSR, back in atrial  fibrillation by 5/18.  - Dofetilide begun, now in NSR.  8. Cardiomyopathy with chronic primarily diastolic CHF:  - Echo in [place] in the past with EF in 45% range.  - Echo (9/16) with EF 50-55%, moderate biatrial enlargement.  - RHC (12/17): mean 12, PA 44/24 mean 33, mean PCWP 22, CI 3.04, PVR 1.3 WU. - Echo (3/18): EF 50%, diffuse hypokinesis, normal RV size and systolic function.  - Echo in 1/19 (at [facility]) showed +bubble study concerning for PFO (weakly positive), EF 45-50%,  moderate LV dilation, mildly decreased RV systolic function.   9. CKD: Stage III.   SH: Nonsmoker, no ETOH.  He used to be an Personnel officer, now on disability.  Lives in [place].   Family History  Problem Relation Age of Onset  . Heart disease Mother   . Diabetes Mother   . Hypertension Mother    ROS: All systems reviewed and negative except as per HPI.   Current Outpatient Medications  Medication Sig Dispense Refill  . dofetilide (TIKOSYN) 500 MCG capsule Take 1 capsule (500 mcg total) by mouth every 12 (twelve) hours. 180 capsule 3  . famotidine (PEPCID) 20 MG tablet Take 1 tablet (20 mg total) by mouth 2 (two) times daily. 60 tablet 1  . Febuxostat (ULORIC) 80 MG TABS Take 80 mg by mouth daily.     . fenofibrate (TRICOR) 145 MG tablet Take 1 tablet (145 mg total) by mouth daily. 30 tablet 1  . furosemide (LASIX) 40 MG tablet Take 1 tablet (40 mg total) by mouth 2 (two) times daily. 60 tablet 3  . HYDROcodone-acetaminophen (NORCO) 10-325 MG tablet Take 1 tablet by mouth 4 (four) times daily.    . magnesium oxide (MAGNESIUM-OXIDE) 400 (241.3 Mg) MG tablet Take 0.5 tablets (200 mg total) by mouth daily. 15 tablet 3  . omeprazole (PRILOSEC) 20 MG capsule Take 20 mg by mouth 2 (two) times daily.    . OXYGEN Inhale 7 L into the lungs continuous.    . potassium chloride SA (K-DUR,KLOR-CON) 20 MEQ tablet Take in the morning and in the evening. 90 tablet 3  . predniSONE (DELTASONE) 20 MG tablet Take 40 mg by mouth daily with breakfast.    . pregabalin (LYRICA) 200 MG capsule Take 1 capsule (200 mg total) by mouth 3 (three) times daily. Take 1 capsule three times a day. 90 capsule 0  . ranolazine (RANEXA) 1000 MG SR tablet Take 1 tablet (1,000 mg total) by mouth 2 (two) times daily. 60 tablet 6  . rivaroxaban (XARELTO) 20 MG TABS tablet Take 20 mg by mouth daily.     Marland Kitchen spironolactone (ALDACTONE) 25 MG tablet Take 25 mg by mouth daily.     Marland Kitchen testosterone cypionate  (DEPOTESTOSTERONE CYPIONATE) 200 MG/ML injection Inject 200 mg into the muscle every Wednesday.     . metoprolol succinate (TOPROL-XL) 25 MG 24 hr tablet Take 1 tablet (25 mg total) by mouth daily. Take with or immediately following a meal. 30 tablet 3   No current facility-administered medications for this encounter.    BP 131/78   Pulse 96   Wt (!) 350 lb (158.8 kg)   SpO2 96% Comment: on 7L of O2  BMI 47.47 kg/m  General: NAD, obese Neck: Thick, no JVD, no thyromegaly or thyroid nodule.  Lungs: Clear to auscultation bilaterally with normal respiratory effort. CV: Nondisplaced PMI.  Heart irregular S1/S2, no S3/S4, no murmur.  Trace ankle edema.  No carotid bruit.  Normal pedal pulses.  Abdomen: Soft, nontender, no hepatosplenomegaly, no distention.  Skin: Intact without lesions or rashes.  Neurologic: Alert and oriented x 3.  Psych: Normal affect. Extremities: No clubbing or cyanosis.  HEENT: Normal.   Assessment/Plan: 1. Pulmonary fibrosis: Noted on HRCT.  He is on home oxygen. Had suspected post-ARDS pulmonary fibrosis. Recently evaluated at [facility], where repeat CT showed evidence of more active inflammation with ?NSIP versus hypersensitivity pneumonitis.   - Prednisone was increased to 40 mg daily and he has started on dapsone.   2. Atrial fibrillation and flutter: Patient was cardioverted in 3/18 to NSR but was back in atrial fibrillation by 5/18.  He maintained NSR for almost a year on dofetilide but is back in atrial flutter today.   - Continue dofetilide, QTc ok today. - He is on ranolazine in addition to dofetilide, per Dr. Graciela Husbands this will be ok to continue as long as QT interval is not excessively prolonged.   - He missed about 4 days of Xarelto 2 wks ago.  I will have him continue Xarelto without missing it for 2 more days then will bring him in for DCCV.  I discussed risks/benefits with him and he agrees to proceed.  - Add Toprol XL 25 mg daily for rate control.  - CBC today  with Xarelto use.  3. Chronic primarily diastolic CHF: Diastolic CHF with pulmonary venous hypertension on 12/17 echo.  NYHA class II-III symptoms, some improvement with pulmonary rehab (likely combination of heart and lungs as cause).  Most recent echo in 3/19 at [facility] with EF 45-50%, mildly decreased RV systolic function. - Continue Lasix 40 mg bid, BMET today.  4. CKD: Stage III.  BMET today.  5. Obesity:  I think weight loss is imperative for him.  6. PFO: PFO noted on last echo with positive bubble study. Per report, it did not appear large.  Will follow without correction for now, I do not think that it is creating significant shunting.   Followup in 1 month in afib clinic and in 3 months with me.  Marca Ancona 10/04/2017

## 2017-10-11 ENCOUNTER — Other Ambulatory Visit (HOSPITAL_COMMUNITY): Payer: Self-pay

## 2017-10-16 ENCOUNTER — Ambulatory Visit (HOSPITAL_COMMUNITY)
Admission: RE | Admit: 2017-10-16 | Discharge: 2017-10-16 | Disposition: A | Discharge status: Home / Self Care | Payer: Medicaid Other | Source: Ambulatory Visit | Attending: Cardiology | Admitting: Cardiology

## 2017-10-16 ENCOUNTER — Encounter (HOSPITAL_COMMUNITY): Payer: Self-pay | Admitting: *Deleted

## 2017-10-16 ENCOUNTER — Ambulatory Visit (HOSPITAL_COMMUNITY): Payer: Medicaid Other | Admitting: Certified Registered Nurse Anesthetist

## 2017-10-16 ENCOUNTER — Encounter (HOSPITAL_COMMUNITY)
Admission: RE | Disposition: A | Discharge status: Home / Self Care | Payer: Self-pay | Source: Ambulatory Visit | Attending: Cardiology

## 2017-10-16 ENCOUNTER — Other Ambulatory Visit: Payer: Self-pay

## 2017-10-16 DIAGNOSIS — E669 Obesity, unspecified: Secondary | ICD-10-CM | POA: Diagnosis not present

## 2017-10-16 DIAGNOSIS — Z7902 Long term (current) use of antithrombotics/antiplatelets: Secondary | ICD-10-CM | POA: Diagnosis not present

## 2017-10-16 DIAGNOSIS — I429 Cardiomyopathy, unspecified: Secondary | ICD-10-CM | POA: Insufficient documentation

## 2017-10-16 DIAGNOSIS — Q211 Atrial septal defect: Secondary | ICD-10-CM | POA: Diagnosis not present

## 2017-10-16 DIAGNOSIS — I4891 Unspecified atrial fibrillation: Secondary | ICD-10-CM | POA: Insufficient documentation

## 2017-10-16 DIAGNOSIS — K219 Gastro-esophageal reflux disease without esophagitis: Secondary | ICD-10-CM | POA: Insufficient documentation

## 2017-10-16 DIAGNOSIS — I712 Thoracic aortic aneurysm, without rupture: Secondary | ICD-10-CM | POA: Diagnosis not present

## 2017-10-16 DIAGNOSIS — I4892 Unspecified atrial flutter: Secondary | ICD-10-CM | POA: Diagnosis not present

## 2017-10-16 DIAGNOSIS — I5032 Chronic diastolic (congestive) heart failure: Secondary | ICD-10-CM | POA: Diagnosis not present

## 2017-10-16 DIAGNOSIS — Z79899 Other long term (current) drug therapy: Secondary | ICD-10-CM | POA: Diagnosis not present

## 2017-10-16 DIAGNOSIS — J8410 Pulmonary fibrosis, unspecified: Secondary | ICD-10-CM | POA: Diagnosis not present

## 2017-10-16 DIAGNOSIS — Z8249 Family history of ischemic heart disease and other diseases of the circulatory system: Secondary | ICD-10-CM | POA: Insufficient documentation

## 2017-10-16 DIAGNOSIS — Z6841 Body Mass Index (BMI) 40.0 and over, adult: Secondary | ICD-10-CM | POA: Diagnosis not present

## 2017-10-16 DIAGNOSIS — Z9981 Dependence on supplemental oxygen: Secondary | ICD-10-CM | POA: Insufficient documentation

## 2017-10-16 DIAGNOSIS — Z8711 Personal history of peptic ulcer disease: Secondary | ICD-10-CM | POA: Insufficient documentation

## 2017-10-16 DIAGNOSIS — G4733 Obstructive sleep apnea (adult) (pediatric): Secondary | ICD-10-CM | POA: Insufficient documentation

## 2017-10-16 DIAGNOSIS — N183 Chronic kidney disease, stage 3 (moderate): Secondary | ICD-10-CM | POA: Insufficient documentation

## 2017-10-16 HISTORY — PX: CARDIOVERSION: SHX1299

## 2017-10-16 LAB — POCT I-STAT 4, (NA,K, GLUC, HGB,HCT)
Glucose, Bld: 104 mg/dL — ABNORMAL HIGH (ref 65–99)
HCT: 51 % (ref 39.0–52.0)
Hemoglobin: 17.3 g/dL — ABNORMAL HIGH (ref 13.0–17.0)
Potassium: 4.8 mmol/L (ref 3.5–5.1)
Sodium: 137 mmol/L (ref 135–145)

## 2017-10-16 SURGERY — CARDIOVERSION
Anesthesia: General

## 2017-10-16 MED ORDER — PROPOFOL 10 MG/ML IV BOLUS
INTRAVENOUS | Status: DC | PRN
  Administered 2017-10-16 (×2): 30 mg via INTRAVENOUS
  Administered 2017-10-16: 70 mg via INTRAVENOUS

## 2017-10-16 MED ORDER — SODIUM CHLORIDE 0.9 % IV SOLN
INTRAVENOUS | Status: DC
  Administered 2017-10-16: 11:00:00 via INTRAVENOUS

## 2017-10-16 MED ORDER — LIDOCAINE 2% (20 MG/ML) 5 ML SYRINGE
INTRAMUSCULAR | Status: DC | PRN
Start: 2017-10-16 — End: 2017-10-16
  Administered 2017-10-16: 40 mg via INTRAVENOUS

## 2017-10-16 NOTE — Interval H&P Note (Signed)
History and Physical Interval Note:  10/16/2017 11:31 AM  Adam Benson  has presented today for surgery, with the diagnosis of AFIB  The various methods of treatment have been discussed with the patient and family. After consideration of risks, benefits and other options for treatment, the patient has consented to  Procedure(s): CARDIOVERSION (N/A) as a surgical intervention .  [name] history has been reviewed, patient examined, no change in status, stable for surgery.  I have reviewed the patient's chart and labs.  Questions were answered to the patient's satisfaction.     Khrista Braun [employer]

## 2017-10-16 NOTE — Procedures (Signed)
Electrical Cardioversion Procedure Note ARMANDO MCEVOY [id number] 02-11-1961  Procedure: Electrical Cardioversion Indications:  Atrial Fibrillation  Procedure Details Consent: Risks of procedure as well as the alternatives and risks of each were explained to the (patient/caregiver).  Consent for procedure obtained. Time Out: Verified patient identification, verified procedure, site/side was marked, verified correct patient position, special equipment/implants available, medications/allergies/relevent history reviewed, required imaging and test results available.  Performed  Patient placed on cardiac monitor, pulse oximetry, supplemental oxygen as necessary.  Sedation given: Propofol per anesthesiology Pacer pads placed anterior and posterior chest.  Cardioverted 1 time(s).  Cardioverted at 200J.  Evaluation Findings: Post procedure EKG shows: NSR Complications: None Patient did tolerate procedure well.   Marca Ancona 10/16/2017, 11:55 AM

## 2017-10-16 NOTE — Transfer of Care (Signed)
Immediate Anesthesia Transfer of Care Note  Patient: Adam Benson  Procedure(s) Performed: CARDIOVERSION (N/A )  Patient Location: Endoscopy Unit  Anesthesia Type:MAC  Level of Consciousness: awake, alert  and oriented  Airway & Oxygen Therapy: Patient Spontanous Breathing and Patient connected to nasal cannula oxygen  Post-op Assessment: Report given to RN and Post -op Vital signs reviewed and stable  Post vital signs: Reviewed and stable  Last Vitals:  Vitals Value Taken Time  BP    Temp    Pulse    Resp    SpO2      Last Pain:  Vitals:   10/16/17 1105  TempSrc: Oral  PainSc: 0-No pain         Complications: No apparent anesthesia complications

## 2017-10-16 NOTE — Anesthesia Preprocedure Evaluation (Addendum)
Anesthesia Evaluation  Patient identified by MRN, date of birth, ID band Patient awake    Reviewed: Allergy & Precautions, NPO status , Patient's Chart, lab work & pertinent test results  Airway Mallampati: II  TM Distance: >3 FB Neck ROM: Full    Dental  (+) Teeth Intact, Dental Advisory Given   Pulmonary sleep apnea, Continuous Positive Airway Pressure Ventilation and Oxygen sleep apnea ,  ARDS (adult respiratory distress syndrome)   breath sounds clear to auscultation       Cardiovascular hypertension, + dysrhythmias Atrial Fibrillation  Rhythm:Irregular Rate:Tachycardia     Neuro/Psych negative neurological ROS  negative psych ROS   GI/Hepatic Neg liver ROS, GERD  ,  Endo/Other  negative endocrine ROS  Renal/GU negative Renal ROS     Musculoskeletal Gout   Abdominal (+) + obese,   Peds  Hematology negative hematology ROS (+)   Anesthesia Other Findings   Reproductive/Obstetrics                            Anesthesia Physical Anesthesia Plan  ASA: III  Anesthesia Plan: General   Post-op Pain Management:    Induction: Intravenous  PONV Risk Score and Plan:   Airway Management Planned: Mask  Additional Equipment: None  Intra-op Plan:   Post-operative Plan:   Informed Consent: I have reviewed the patients History and Physical, chart, labs and discussed the procedure including the risks, benefits and alternatives for the proposed anesthesia with the patient or authorized representative who has indicated his/her understanding and acceptance.     Plan Discussed with: CRNA  Anesthesia Plan Comments:         Anesthesia Quick Evaluation

## 2017-10-16 NOTE — Anesthesia Postprocedure Evaluation (Signed)
Anesthesia Post Note  Patient: Adam Benson  Procedure(s) Performed: CARDIOVERSION (N/A )     Patient location during evaluation: PACU Anesthesia Type: General Level of consciousness: awake and alert Pain management: pain level controlled Vital Signs Assessment: post-procedure vital signs reviewed and stable Respiratory status: spontaneous breathing, nonlabored ventilation, respiratory function stable and patient connected to nasal cannula oxygen Cardiovascular status: blood pressure returned to baseline and stable Postop Assessment: no apparent nausea or vomiting Anesthetic complications: no    Last Vitals:  Vitals:   10/16/17 1212 10/16/17 1217  BP: 107/60 108/61  Pulse: 72 73  Resp: 17 20  Temp:    SpO2: 96% 97%    Last Pain:  Vitals:   10/16/17 1217  TempSrc:   PainSc: 0-No pain                 [name]

## 2017-10-16 NOTE — Discharge Instructions (Signed)
Electrical Cardioversion, Care After °This sheet gives you information about how to care for yourself after your procedure. Your health care provider may also give you more specific instructions. If you have problems or questions, contact your health care provider. °What can I expect after the procedure? °After the procedure, it is common to have: °· Some redness on the skin where the shocks were given. ° °Follow these instructions at home: °· Do not drive for 24 hours if you were given a medicine to help you relax (sedative). °· Take over-the-counter and prescription medicines only as told by your health care provider. °· Ask your health care provider how to check your pulse. Check it often. °· Rest for 48 hours after the procedure or as told by your health care provider. °· Avoid or limit your caffeine use as told by your health care provider. °Contact a health care provider if: °· You feel like your heart is beating too quickly or your pulse is not regular. °· You have a serious muscle cramp that does not go away. °Get help right away if: °· You have discomfort in your chest. °· You are dizzy or you feel faint. °· You have trouble breathing or you are short of breath. °· Your speech is slurred. °· You have trouble moving an arm or leg on one side of your body. °· Your fingers or toes turn cold or blue. °This information is not intended to replace advice given to you by your health care provider. Make sure you discuss any questions you have with your health care provider. °Document Released: 03/06/2013 Document Revised: 12/18/2015 Document Reviewed: 11/20/2015 °Elsevier Interactive Patient Education © 2018 [employer] ° °

## 2017-10-20 ENCOUNTER — Other Ambulatory Visit: Payer: Self-pay | Admitting: Nurse Practitioner

## 2017-10-20 NOTE — Telephone Encounter (Signed)
This is a CHF pt 

## 2017-10-24 ENCOUNTER — Encounter (HOSPITAL_COMMUNITY): Payer: Self-pay | Admitting: Nurse Practitioner

## 2017-10-24 ENCOUNTER — Ambulatory Visit (HOSPITAL_COMMUNITY)
Admission: RE | Admit: 2017-10-24 | Discharge: 2017-10-24 | Disposition: A | Discharge status: Home / Self Care | Payer: Medicaid Other | Source: Ambulatory Visit | Attending: Nurse Practitioner | Admitting: Nurse Practitioner

## 2017-10-24 VITALS — BP 158/84 | HR 85 | Ht 72.0 in | Wt 346.6 lb

## 2017-10-24 DIAGNOSIS — M109 Gout, unspecified: Secondary | ICD-10-CM | POA: Diagnosis not present

## 2017-10-24 DIAGNOSIS — I480 Paroxysmal atrial fibrillation: Secondary | ICD-10-CM | POA: Diagnosis not present

## 2017-10-24 DIAGNOSIS — Z930 Tracheostomy status: Secondary | ICD-10-CM | POA: Diagnosis not present

## 2017-10-24 DIAGNOSIS — I5032 Chronic diastolic (congestive) heart failure: Secondary | ICD-10-CM | POA: Diagnosis not present

## 2017-10-24 DIAGNOSIS — Z8249 Family history of ischemic heart disease and other diseases of the circulatory system: Secondary | ICD-10-CM | POA: Insufficient documentation

## 2017-10-24 DIAGNOSIS — Z7901 Long term (current) use of anticoagulants: Secondary | ICD-10-CM | POA: Insufficient documentation

## 2017-10-24 DIAGNOSIS — Z79899 Other long term (current) drug therapy: Secondary | ICD-10-CM | POA: Diagnosis not present

## 2017-10-24 DIAGNOSIS — K219 Gastro-esophageal reflux disease without esophagitis: Secondary | ICD-10-CM | POA: Insufficient documentation

## 2017-10-24 DIAGNOSIS — Z9981 Dependence on supplemental oxygen: Secondary | ICD-10-CM | POA: Diagnosis not present

## 2017-10-24 DIAGNOSIS — Z9889 Other specified postprocedural states: Secondary | ICD-10-CM | POA: Insufficient documentation

## 2017-10-24 DIAGNOSIS — Z833 Family history of diabetes mellitus: Secondary | ICD-10-CM | POA: Diagnosis not present

## 2017-10-24 DIAGNOSIS — Z888 Allergy status to other drugs, medicaments and biological substances status: Secondary | ICD-10-CM | POA: Diagnosis not present

## 2017-10-24 DIAGNOSIS — I481 Persistent atrial fibrillation: Secondary | ICD-10-CM | POA: Diagnosis not present

## 2017-10-24 DIAGNOSIS — Z87442 Personal history of urinary calculi: Secondary | ICD-10-CM | POA: Diagnosis not present

## 2017-10-24 DIAGNOSIS — E669 Obesity, unspecified: Secondary | ICD-10-CM | POA: Insufficient documentation

## 2017-10-24 DIAGNOSIS — Z880 Allergy status to penicillin: Secondary | ICD-10-CM | POA: Insufficient documentation

## 2017-10-24 DIAGNOSIS — G4700 Insomnia, unspecified: Secondary | ICD-10-CM | POA: Diagnosis not present

## 2017-10-24 DIAGNOSIS — E785 Hyperlipidemia, unspecified: Secondary | ICD-10-CM | POA: Diagnosis not present

## 2017-10-24 DIAGNOSIS — G4733 Obstructive sleep apnea (adult) (pediatric): Secondary | ICD-10-CM | POA: Diagnosis not present

## 2017-10-24 DIAGNOSIS — Z79891 Long term (current) use of opiate analgesic: Secondary | ICD-10-CM | POA: Insufficient documentation

## 2017-10-24 DIAGNOSIS — J8410 Pulmonary fibrosis, unspecified: Secondary | ICD-10-CM | POA: Insufficient documentation

## 2017-10-24 DIAGNOSIS — I110 Hypertensive heart disease with heart failure: Secondary | ICD-10-CM | POA: Insufficient documentation

## 2017-10-24 DIAGNOSIS — Z7952 Long term (current) use of systemic steroids: Secondary | ICD-10-CM | POA: Insufficient documentation

## 2017-10-24 LAB — MAGNESIUM: MAGNESIUM: 2.2 mg/dL (ref 1.7–2.4)
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
## 2017-11-02 ENCOUNTER — Telehealth (HOSPITAL_COMMUNITY): Payer: Self-pay | Admitting: *Deleted

## 2017-11-02 NOTE — Telephone Encounter (Signed)
Tikosyn is managed by the afib clinic. CHF clinic does not fill Tikosyn. Any Tikosyn refills need to be routed to afib clinic.

## 2017-11-02 NOTE — Telephone Encounter (Signed)
PA approved for [employer] via [employer] [phone]

## 2017-11-06 ENCOUNTER — Ambulatory Visit (HOSPITAL_COMMUNITY)
Admission: RE | Admit: 2017-11-06 | Discharge: 2017-11-06 | Disposition: A | Discharge status: Home / Self Care | Payer: Medicaid Other | Source: Ambulatory Visit | Attending: Nurse Practitioner | Admitting: Nurse Practitioner

## 2017-11-06 ENCOUNTER — Encounter (HOSPITAL_COMMUNITY): Payer: Self-pay | Admitting: Nurse Practitioner

## 2017-11-06 VITALS — BP 136/94 | HR 92 | Ht 72.0 in | Wt 357.0 lb

## 2017-11-06 DIAGNOSIS — I2729 Other secondary pulmonary hypertension: Secondary | ICD-10-CM

## 2017-11-06 DIAGNOSIS — K219 Gastro-esophageal reflux disease without esophagitis: Secondary | ICD-10-CM | POA: Diagnosis not present

## 2017-11-06 DIAGNOSIS — G4700 Insomnia, unspecified: Secondary | ICD-10-CM | POA: Insufficient documentation

## 2017-11-06 DIAGNOSIS — Z79899 Other long term (current) drug therapy: Secondary | ICD-10-CM | POA: Insufficient documentation

## 2017-11-06 DIAGNOSIS — I4819 Other persistent atrial fibrillation: Secondary | ICD-10-CM

## 2017-11-06 DIAGNOSIS — Z7901 Long term (current) use of anticoagulants: Secondary | ICD-10-CM | POA: Diagnosis not present

## 2017-11-06 DIAGNOSIS — I481 Persistent atrial fibrillation: Secondary | ICD-10-CM | POA: Diagnosis not present

## 2017-11-06 DIAGNOSIS — J984 Other disorders of lung: Secondary | ICD-10-CM | POA: Insufficient documentation

## 2017-11-06 DIAGNOSIS — Z9981 Dependence on supplemental oxygen: Secondary | ICD-10-CM | POA: Diagnosis not present

## 2017-11-06 DIAGNOSIS — F17220 Nicotine dependence, chewing tobacco, uncomplicated: Secondary | ICD-10-CM | POA: Diagnosis not present

## 2017-11-06 DIAGNOSIS — Z87442 Personal history of urinary calculi: Secondary | ICD-10-CM | POA: Insufficient documentation

## 2017-11-06 DIAGNOSIS — Z7952 Long term (current) use of systemic steroids: Secondary | ICD-10-CM | POA: Insufficient documentation

## 2017-11-06 DIAGNOSIS — I10 Essential (primary) hypertension: Secondary | ICD-10-CM | POA: Insufficient documentation

## 2017-11-06 DIAGNOSIS — J8410 Pulmonary fibrosis, unspecified: Secondary | ICD-10-CM | POA: Diagnosis not present

## 2017-11-06 DIAGNOSIS — G4733 Obstructive sleep apnea (adult) (pediatric): Secondary | ICD-10-CM | POA: Diagnosis not present

## 2017-11-06 DIAGNOSIS — E785 Hyperlipidemia, unspecified: Secondary | ICD-10-CM | POA: Diagnosis not present

## 2017-11-06 DIAGNOSIS — M109 Gout, unspecified: Secondary | ICD-10-CM | POA: Diagnosis not present

## 2017-11-06 DIAGNOSIS — Z6841 Body Mass Index (BMI) 40.0 and over, adult: Secondary | ICD-10-CM | POA: Insufficient documentation

## 2017-11-06 MED ORDER — DOFETILIDE 500 MCG PO CAPS: ORAL_CAPSULE | ORAL | 1 refills | Status: AC
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
## 2017-11-20 ENCOUNTER — Other Ambulatory Visit (HOSPITAL_COMMUNITY): Payer: Self-pay | Admitting: *Deleted

## 2017-11-20 ENCOUNTER — Telehealth (HOSPITAL_COMMUNITY): Payer: Self-pay | Admitting: *Deleted

## 2017-11-20 DIAGNOSIS — I5032 Chronic diastolic (congestive) heart failure: Secondary | ICD-10-CM

## 2017-11-20 MED ORDER — FUROSEMIDE 40 MG PO TABS: 40.0000 mg | ORAL_TABLET | Freq: Two times a day (BID) | ORAL | 3 refills | Status: DC

## 2017-11-20 NOTE — Telephone Encounter (Signed)
Advanced Heart Failure Triage Encounter  Patient Name: Adam Benson  Date of Call: 11/20/17  Problem:  Patient called again this afternoon stating he needed lasix refilled.  Refills were sent to preferred pharmacy this earlier today so I told him they should be ready.  He also stated he gained 15 lbs in the past 3 days.  His weight today was 375 lbs and was 360 lbs this past Friday. He stated diet/fluid intake hasn't changed. I asked patient if he had missed any doses of lasix and he stated he ran out of pills last week.   Plan:  I advised patient to go pickup medication today and to restart lasix 40 mg BID.  If he continues to have issues he call us back.  No further questions.   Georgina Peer, RN

## 2017-12-04 ENCOUNTER — Telehealth (HOSPITAL_COMMUNITY): Payer: Self-pay | Admitting: *Deleted

## 2017-12-04 NOTE — Telephone Encounter (Signed)
Advanced Heart Failure Triage Encounter  Patient Name: Adam Benson  Date of Call: 12/04/17  Problem:  Patient called in stating he has been having on and off chest pain since 7/3 and will start sweating while at rest in an air conditioned room.  He also stated he has been doubling up his lasix taking 80 BID for 2 weeks and is now out of medication.  Patient stated he made the decision to double his lasix and was not advised by another provider.   Plan:  I advised patient that with all his symptoms he needed to go the ER.  Patient stated he refuses to go to the ER and he will wait and be seen at his follow up appointment and ended the call.    Georgina Peer, RN

## 2017-12-20 ENCOUNTER — Other Ambulatory Visit: Payer: Self-pay | Admitting: Nurse Practitioner

## 2017-12-20 ENCOUNTER — Other Ambulatory Visit (HOSPITAL_COMMUNITY): Payer: Self-pay | Admitting: *Deleted

## 2017-12-20 DIAGNOSIS — I5032 Chronic diastolic (congestive) heart failure: Secondary | ICD-10-CM

## 2017-12-20 MED ORDER — POTASSIUM CHLORIDE CRYS ER 20 MEQ PO TBCR: EXTENDED_RELEASE_TABLET | ORAL | 6 refills | Status: DC

## 2017-12-20 MED ORDER — MAGNESIUM OXIDE 400 (241.3 MG) MG PO TABS: 200.0000 mg | ORAL_TABLET | Freq: Every day | ORAL | 6 refills | Status: DC

## 2017-12-20 NOTE — Telephone Encounter (Signed)
This is a A-Fib clinic pt 

## 2018-01-05 ENCOUNTER — Encounter: Payer: Self-pay | Admitting: *Deleted

## 2018-01-05 ENCOUNTER — Other Ambulatory Visit: Payer: Self-pay

## 2018-01-05 ENCOUNTER — Ambulatory Visit (HOSPITAL_COMMUNITY)
Admission: RE | Admit: 2018-01-05 | Discharge: 2018-01-05 | Disposition: A | Discharge status: Home / Self Care | Payer: Medicaid Other | Source: Ambulatory Visit | Attending: Cardiology | Admitting: Cardiology

## 2018-01-05 ENCOUNTER — Encounter (HOSPITAL_COMMUNITY): Payer: Self-pay | Admitting: Cardiology

## 2018-01-05 VITALS — BP 140/88 | HR 87 | Wt 368.4 lb

## 2018-01-05 DIAGNOSIS — Z8249 Family history of ischemic heart disease and other diseases of the circulatory system: Secondary | ICD-10-CM | POA: Diagnosis not present

## 2018-01-05 DIAGNOSIS — K219 Gastro-esophageal reflux disease without esophagitis: Secondary | ICD-10-CM | POA: Diagnosis not present

## 2018-01-05 DIAGNOSIS — I482 Chronic atrial fibrillation, unspecified: Secondary | ICD-10-CM

## 2018-01-05 DIAGNOSIS — I5032 Chronic diastolic (congestive) heart failure: Secondary | ICD-10-CM | POA: Diagnosis not present

## 2018-01-05 DIAGNOSIS — I4892 Unspecified atrial flutter: Secondary | ICD-10-CM | POA: Diagnosis not present

## 2018-01-05 DIAGNOSIS — Z79899 Other long term (current) drug therapy: Secondary | ICD-10-CM | POA: Diagnosis not present

## 2018-01-05 DIAGNOSIS — I712 Thoracic aortic aneurysm, without rupture: Secondary | ICD-10-CM | POA: Diagnosis not present

## 2018-01-05 DIAGNOSIS — Z6841 Body Mass Index (BMI) 40.0 and over, adult: Secondary | ICD-10-CM | POA: Insufficient documentation

## 2018-01-05 DIAGNOSIS — Z9981 Dependence on supplemental oxygen: Secondary | ICD-10-CM | POA: Insufficient documentation

## 2018-01-05 DIAGNOSIS — Z833 Family history of diabetes mellitus: Secondary | ICD-10-CM | POA: Insufficient documentation

## 2018-01-05 DIAGNOSIS — E669 Obesity, unspecified: Secondary | ICD-10-CM | POA: Diagnosis not present

## 2018-01-05 DIAGNOSIS — I429 Cardiomyopathy, unspecified: Secondary | ICD-10-CM | POA: Insufficient documentation

## 2018-01-05 DIAGNOSIS — I4891 Unspecified atrial fibrillation: Secondary | ICD-10-CM | POA: Diagnosis not present

## 2018-01-05 DIAGNOSIS — I2729 Other secondary pulmonary hypertension: Secondary | ICD-10-CM | POA: Diagnosis not present

## 2018-01-05 DIAGNOSIS — I272 Pulmonary hypertension, unspecified: Secondary | ICD-10-CM | POA: Insufficient documentation

## 2018-01-05 DIAGNOSIS — Z7952 Long term (current) use of systemic steroids: Secondary | ICD-10-CM | POA: Insufficient documentation

## 2018-01-05 DIAGNOSIS — Z8711 Personal history of peptic ulcer disease: Secondary | ICD-10-CM | POA: Insufficient documentation

## 2018-01-05 DIAGNOSIS — J8410 Pulmonary fibrosis, unspecified: Secondary | ICD-10-CM | POA: Insufficient documentation

## 2018-01-05 DIAGNOSIS — G4733 Obstructive sleep apnea (adult) (pediatric): Secondary | ICD-10-CM | POA: Diagnosis not present

## 2018-01-05 DIAGNOSIS — Z7901 Long term (current) use of anticoagulants: Secondary | ICD-10-CM | POA: Diagnosis not present

## 2018-01-05 DIAGNOSIS — N183 Chronic kidney disease, stage 3 (moderate): Secondary | ICD-10-CM | POA: Insufficient documentation

## 2018-01-05 DIAGNOSIS — Q211 Atrial septal defect: Secondary | ICD-10-CM | POA: Insufficient documentation

## 2018-01-05 DIAGNOSIS — Z006 Encounter for examination for normal comparison and control in clinical research program: Secondary | ICD-10-CM

## 2018-01-05 LAB — CBC
HEMATOCRIT: 50.7 % (ref 39.0–52.0)
HEMOGLOBIN: 14 g/dL (ref 13.0–17.0)
MCH: 23.7 pg — AB (ref 26.0–34.0)
MCHC: 27.6 g/dL — AB (ref 30.0–36.0)
MCV: 85.8 fL (ref 78.0–100.0)
Platelets: 343 10*3/uL (ref 150–400)
RBC: 5.91 MIL/uL — ABNORMAL HIGH (ref 4.22–5.81)
RDW: 19.7 % — ABNORMAL HIGH (ref 11.5–15.5)
WBC: 13.2 10*3/uL — ABNORMAL HIGH (ref 4.0–10.5)

## 2018-01-05 LAB — BASIC METABOLIC PANEL
ANION GAP: 13 (ref 5–15)
BUN: 13 mg/dL (ref 6–20)
CALCIUM: 8.8 mg/dL — AB (ref 8.9–10.3)
CO2: 33 mmol/L — ABNORMAL HIGH (ref 22–32)
Chloride: 94 mmol/L — ABNORMAL LOW (ref 98–111)
Creatinine, Ser: 1.62 mg/dL — ABNORMAL HIGH (ref 0.61–1.24)
GFR calc Af Amer: 54 mL/min — ABNORMAL LOW (ref >60)
GFR calc non Af Amer: 47 mL/min — ABNORMAL LOW (ref >60)
GLUCOSE: 166 mg/dL — AB (ref 70–99)
POTASSIUM: 3.6 mmol/L (ref 3.5–5.1)
Sodium: 140 mmol/L (ref 135–145)

## 2018-01-05 LAB — MAGNESIUM: Magnesium: 2.2 mg/dL (ref 1.7–2.4)

## 2018-01-05 MED ORDER — FUROSEMIDE 40 MG PO TABS: ORAL_TABLET | ORAL | 3 refills | Status: DC

## 2018-01-05 MED ORDER — POTASSIUM CHLORIDE CRYS ER 20 MEQ PO TBCR: 40.0000 meq | EXTENDED_RELEASE_TABLET | Freq: Two times a day (BID) | ORAL | 6 refills | Status: DC

## 2018-01-05 NOTE — Patient Instructions (Signed)
Increase Furosemide to 80 mg (2 tabs) in AM and 40 mg (1 tab) in PM  Increase Potassium to 40 meq (2 tabs) Twice daily   Labs today  Labs in 10 days  Your physician recommends that you schedule a follow-up appointment in: 3 weeks

## 2018-01-05 NOTE — Progress Notes (Signed)
Mr. Berns met criteria for the [facility] trial. The study was explained to the patient including risk vs. Benefits. The patient was given the opportunity to read the consent and ask questions. The consent was signed and a copy was given to the patient. No study related procedures were performed prior to signing the consent.

## 2018-01-07 NOTE — Progress Notes (Signed)
PCP: O'Buch Pulmonary: Dr. [name] Cardiology: Dr. Shirlee Latch  57 y.o. 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  He was followed in [facility] in the past for cardiology. Dr. [name] has been following him for pulmonary fibrosis. He had ARDs twice in 2016, once 1/16-2/16.  The second time, he had PNA developing into ARDS and was in the hospital for 3 months.  High resolution CT done 2/18 showed pulmonary fibrosis.  He is on home oxygen during the day and CPAP (for OSA) at night.  He is on prednisone 5 mg daily for pulmonary fibrosis.   I took him for RHC in 12/17.  This showed mildly elevated filling pressures and pulmonary venous hypertension.   He has also had atrial arrhythmias, both atrial fibrillation and atrial flutter.  He has been cardioverted several times.  He has been on ranolazine and Multaq, but his insurance company said that it would not cover Multaq any longer and he stopped it.  He had DCCV to NSR in 3/18 but was back in atrial fibrillation in 5/18.  I had him evaluated by EP and he was admitted for dofetilide load.  Now on dofetilide.   He was seen by pulmonology at [facility] in 2/19.  Chest CT was done, per the pulmonology note, there was evidence of inflammation.  It was an unusual ILD pattern for post-ARDS fibrosis, there was concern for NSIP versus hypersensitivity pneumonitis. He had evidence for mold sensitization.  Echo in 3/19 showed +bubble study concerning for PFO (weakly positive), EF 45-50%, moderate LV dilation, mildly decreased RV systolic function.  Prednisone was increased and he was started on dapsone.   Recurrent afib in 5/19, underwent DCCV.    Patient reports increased dyspnea for about a month now.  Weight is up 9 kg on our scale.  He is short of breath walking more than about 100 feet.  Using his  CPAP and home oxygen.  No chest pain.  He feels like he is out of rhythm but ECG today shows NSR.    ECG (personally reviewed): NSR, QTc 476 msec  Labs (12/17): K 4.6, creatinine 1.98, hgb 11.5 Labs (3/18): K 4.2, creatinine 1.79, BNP 53, hgb 13.4 Labs (4/18): K 4.5, creatinine 1.8 Labs (5/18): K 4.2, creatinine 1.77, Mg 1.9 Labs (6/18): K 3.9, creatinine 1.5 Labs (12/18): K 4.3, creatinine 2.01 Labs (2/19): ANA negative, RF negative Labs (4/19): LDL 118, TGs 266 Labs (5/19): K 3.7, creatinine 1.8  PMH: 1. PUD 2. GERD 3. OSA: Using CPAP.  4. ARDS: 1/16-2/16 initially, then admitted again in 9/16 for 3 months with PNA and ARDS.  He developed post-inflammatory pulmonary fibrosis. 5. Pulmonary fibrosis: Thought initially to be post-inflammatory pulmonary fibrosis developing after PNA and ARDS.  - High resolution CT chest (2/18): Pulmonary fibrosis noted, looks like NSIP versus chronic hypersensitivity pneumonitis.  - On home oxygen.  - CT at [facility] in 2/19 was more suggestive of inflammation: It was an unusual ILD pattern for post-ARDS fibrosis, there was concern for NSIP versus hypersensitivity pneumonitis. 6. Ascending aortic aneurysm:  4.6 cm on CT in 2/18.  7. Atrial arrhythmias: Atrial fibrillation and atrial flutter have both been noted.  He has had several cardioversions in the past.  Was followed at [facility], was on ranolazine + Multaq, but insurance will not cover Multaq.  - DCCV 3/18 to  NSR, back in atrial fibrillation by 5/18.  - Dofetilide begun, now in NSR.  - DCCV 5/19 8. Cardiomyopathy with chronic primarily diastolic CHF:  - Echo in [place] in the past with EF in 45% range.  - Echo (9/16) with EF 50-55%, moderate biatrial enlargement.  - RHC (12/17): mean 12, PA 44/24 mean 33, mean PCWP 22, CI 3.04, PVR 1.3 WU. - Echo (3/18): EF 50%, diffuse hypokinesis, normal RV size and systolic function.  - Echo in 6/37 (at [facility]) showed +bubble study concerning for PFO (weakly  positive), EF 45-50%, moderate LV dilation, mildly decreased RV systolic function.   9. CKD: Stage III.   SH: Nonsmoker, no ETOH.  He used to be an Personnel officer, now on disability.  Lives in [place].   Family History  Problem Relation Age of Onset  . Heart disease Mother   . Diabetes Mother   . Hypertension Mother    ROS: All systems reviewed and negative except as per HPI.   Current Outpatient Medications  Medication Sig Dispense Refill  . dofetilide (TIKOSYN) 500 MCG capsule TAKE 1 CAPSULE BY MOUTH EVERY 12 HOURS 180 capsule 1  . famotidine (PEPCID) 20 MG tablet Take 1 tablet (20 mg total) by mouth 2 (two) times daily. 60 tablet 1  . Febuxostat (ULORIC) 80 MG TABS Take 80 mg by mouth daily.     . fenofibrate (TRICOR) 145 MG tablet Take 1 tablet (145 mg total) by mouth daily. 30 tablet 1  . furosemide (LASIX) 40 MG tablet Take 2 tabs in AM and 1 tab in PM 90 tablet 3  . HYDROcodone-acetaminophen (NORCO) 10-325 MG tablet Take 1 tablet by mouth 4 (four) times daily.    . magnesium oxide (MAGNESIUM-OXIDE) 400 (241.3 Mg) MG tablet Take 0.5 tablets (200 mg total) by mouth daily. 15 tablet 6  . metoprolol succinate (TOPROL-XL) 25 MG 24 hr tablet Take 1 tablet (25 mg total) by mouth daily. Take with or immediately following a meal. 30 tablet 3  . omeprazole (PRILOSEC) 20 MG capsule Take 20 mg by mouth 2 (two) times daily.    . OXYGEN Inhale 7 L into the lungs continuous.    . potassium chloride SA (K-DUR,KLOR-CON) 20 MEQ tablet Take 2 tablets (40 mEq total) by mouth 2 (two) times daily. 60 tablet 6  . predniSONE (DELTASONE) 20 MG tablet Take 40 mg by mouth daily with breakfast.    . pregabalin (LYRICA) 200 MG capsule Take 1 capsule (200 mg total) by mouth 3 (three) times daily. Take 1 capsule three times a day. 90 capsule 0  . ranolazine (RANEXA) 1000 MG SR tablet Take 1 tablet (1,000 mg total) by mouth 2 (two) times daily. 60 tablet 6  . rivaroxaban (XARELTO) 20 MG TABS tablet Take 20 mg  by mouth daily.     Marland Kitchen testosterone cypionate (DEPOTESTOSTERONE CYPIONATE) 200 MG/ML injection Inject 200 mg into the muscle every Wednesday.      No current facility-administered medications for this encounter.    BP 140/88   Pulse 87   Wt (!) 167.1 kg (368 lb 6 oz)   SpO2 97% Comment: on 7L of O2  BMI 49.96 kg/m  General: NAD, obese Neck: JVP 10-11 cm, no thyromegaly or thyroid nodule.  Lungs: Clear to auscultation bilaterally with normal respiratory effort. CV: Nonpalpable PMI.  Heart regular S1/S2, no S3/S4, no murmur. 1+ ankle edema.  No carotid bruit.  Normal pedal pulses.  Abdomen: Soft, nontender, no hepatosplenomegaly, no distention.  Skin: Intact without lesions or rashes.  Neurologic: Alert and oriented x 3.  Psych: Normal affect. Extremities: No clubbing or cyanosis.  HEENT: Normal.    Assessment/Plan: 1. Pulmonary fibrosis: Noted on HRCT.  He is on home oxygen. Had suspected post-ARDS pulmonary fibrosis. Recently evaluated at [facility], where repeat CT showed evidence of more active inflammation with ?NSIP versus hypersensitivity pneumonitis.   - Prednisone was increased to 40 mg daily and he has started on dapsone.   2. Atrial fibrillation and flutter: Not an ablation candidate given size, pulmonary disease.  Patient was cardioverted in 3/18 to NSR but was back in atrial fibrillation by 5/18.  He is now on dofetilide and ranolazine.  He had repeat DCCV in 5/19.  - Continue dofetilide, QTc ok today. - He is on ranolazine in addition to dofetilide, per Dr. Graciela Husbands this will be ok to continue as long as QT interval is not excessively prolonged => QTc 476 msec today.   - Check BMET/Mg.  - Continue Xarelto.  - Continue Toprol XL.  3. Chronic primarily diastolic CHF: Diastolic CHF with pulmonary venous hypertension on 12/17 echo.  Most recent echo in 3/19 at [facility] with EF 45-50%, mildly decreased RV systolic function.  NYHA class III, increased dyspnea recently with weight up.  He is  volume overloaded on exam.  - Increase Lasix to 80 qam, 40 qpm.  Increase KCl to 40 bid. BMET today and again in 10 days.   4. CKD: Stage III.  BMET today.  5. Obesity:  I think weight loss is imperative for him.  6. PFO: PFO noted on last echo with positive bubble study. Per report, it did not appear large.  Will follow without correction for now, I do not think that it is creating significant shunting.   Followup 3 wks.   Marca Ancona 01/07/2018

## 2018-01-15 ENCOUNTER — Ambulatory Visit (HOSPITAL_COMMUNITY)
Admission: RE | Admit: 2018-01-15 | Discharge: 2018-01-15 | Disposition: A | Discharge status: Home / Self Care | Payer: Medicaid Other | Source: Ambulatory Visit | Attending: Internal Medicine | Admitting: Internal Medicine

## 2018-01-15 DIAGNOSIS — I2729 Other secondary pulmonary hypertension: Secondary | ICD-10-CM | POA: Diagnosis present

## 2018-01-15 DIAGNOSIS — I482 Chronic atrial fibrillation, unspecified: Secondary | ICD-10-CM

## 2018-01-15 LAB — BASIC METABOLIC PANEL
ANION GAP: 11 (ref 5–15)
BUN: 17 mg/dL (ref 6–20)
CO2: 32 mmol/L (ref 22–32)
Calcium: 8.8 mg/dL — ABNORMAL LOW (ref 8.9–10.3)
Chloride: 95 mmol/L — ABNORMAL LOW (ref 98–111)
Creatinine, Ser: 1.8 mg/dL — ABNORMAL HIGH (ref 0.61–1.24)
GFR calc Af Amer: 48 mL/min — ABNORMAL LOW (ref >60)
GFR calc non Af Amer: 41 mL/min — ABNORMAL LOW (ref >60)
GLUCOSE: 100 mg/dL — AB (ref 70–99)
POTASSIUM: 3.7 mmol/L (ref 3.5–5.1)
Sodium: 138 mmol/L (ref 135–145)

## 2018-01-16 ENCOUNTER — Encounter: Payer: Self-pay | Admitting: Internal Medicine

## 2018-01-26 ENCOUNTER — Ambulatory Visit (HOSPITAL_COMMUNITY)
Admission: RE | Admit: 2018-01-26 | Discharge: 2018-01-26 | Disposition: A | Discharge status: Home / Self Care | Payer: Medicaid Other | Source: Ambulatory Visit | Attending: Cardiology | Admitting: Cardiology

## 2018-01-26 VITALS — BP 160/90 | HR 86 | Wt 360.6 lb

## 2018-01-26 DIAGNOSIS — I4892 Unspecified atrial flutter: Secondary | ICD-10-CM | POA: Diagnosis not present

## 2018-01-26 DIAGNOSIS — Z833 Family history of diabetes mellitus: Secondary | ICD-10-CM | POA: Insufficient documentation

## 2018-01-26 DIAGNOSIS — K219 Gastro-esophageal reflux disease without esophagitis: Secondary | ICD-10-CM | POA: Diagnosis not present

## 2018-01-26 DIAGNOSIS — I429 Cardiomyopathy, unspecified: Secondary | ICD-10-CM | POA: Diagnosis not present

## 2018-01-26 DIAGNOSIS — J8410 Pulmonary fibrosis, unspecified: Secondary | ICD-10-CM | POA: Insufficient documentation

## 2018-01-26 DIAGNOSIS — G4733 Obstructive sleep apnea (adult) (pediatric): Secondary | ICD-10-CM | POA: Insufficient documentation

## 2018-01-26 DIAGNOSIS — Z7952 Long term (current) use of systemic steroids: Secondary | ICD-10-CM | POA: Insufficient documentation

## 2018-01-26 DIAGNOSIS — Z6841 Body Mass Index (BMI) 40.0 and over, adult: Secondary | ICD-10-CM | POA: Insufficient documentation

## 2018-01-26 DIAGNOSIS — E669 Obesity, unspecified: Secondary | ICD-10-CM | POA: Diagnosis not present

## 2018-01-26 DIAGNOSIS — Z7901 Long term (current) use of anticoagulants: Secondary | ICD-10-CM | POA: Insufficient documentation

## 2018-01-26 DIAGNOSIS — Z8249 Family history of ischemic heart disease and other diseases of the circulatory system: Secondary | ICD-10-CM | POA: Diagnosis not present

## 2018-01-26 DIAGNOSIS — I5032 Chronic diastolic (congestive) heart failure: Secondary | ICD-10-CM | POA: Diagnosis not present

## 2018-01-26 DIAGNOSIS — I130 Hypertensive heart and chronic kidney disease with heart failure and stage 1 through stage 4 chronic kidney disease, or unspecified chronic kidney disease: Secondary | ICD-10-CM | POA: Diagnosis not present

## 2018-01-26 DIAGNOSIS — I5030 Unspecified diastolic (congestive) heart failure: Secondary | ICD-10-CM | POA: Insufficient documentation

## 2018-01-26 DIAGNOSIS — I480 Paroxysmal atrial fibrillation: Secondary | ICD-10-CM

## 2018-01-26 DIAGNOSIS — Q211 Atrial septal defect: Secondary | ICD-10-CM | POA: Insufficient documentation

## 2018-01-26 DIAGNOSIS — J9611 Chronic respiratory failure with hypoxia: Secondary | ICD-10-CM | POA: Insufficient documentation

## 2018-01-26 DIAGNOSIS — I4891 Unspecified atrial fibrillation: Secondary | ICD-10-CM | POA: Insufficient documentation

## 2018-01-26 DIAGNOSIS — Z9981 Dependence on supplemental oxygen: Secondary | ICD-10-CM | POA: Diagnosis not present

## 2018-01-26 DIAGNOSIS — Z79899 Other long term (current) drug therapy: Secondary | ICD-10-CM | POA: Insufficient documentation

## 2018-01-26 DIAGNOSIS — I428 Other cardiomyopathies: Secondary | ICD-10-CM

## 2018-01-26 DIAGNOSIS — I2729 Other secondary pulmonary hypertension: Secondary | ICD-10-CM

## 2018-01-26 DIAGNOSIS — Z8711 Personal history of peptic ulcer disease: Secondary | ICD-10-CM | POA: Insufficient documentation

## 2018-01-26 DIAGNOSIS — I712 Thoracic aortic aneurysm, without rupture: Secondary | ICD-10-CM | POA: Insufficient documentation

## 2018-01-26 DIAGNOSIS — N183 Chronic kidney disease, stage 3 (moderate): Secondary | ICD-10-CM | POA: Diagnosis not present

## 2018-01-26 DIAGNOSIS — N289 Disorder of kidney and ureter, unspecified: Secondary | ICD-10-CM | POA: Diagnosis not present

## 2018-01-26 LAB — BASIC METABOLIC PANEL
Anion gap: 9 (ref 5–15)
BUN: 14 mg/dL (ref 6–20)
CALCIUM: 8.8 mg/dL — AB (ref 8.9–10.3)
CO2: 35 mmol/L — AB (ref 22–32)
Chloride: 97 mmol/L — ABNORMAL LOW (ref 98–111)
Creatinine, Ser: 1.62 mg/dL — ABNORMAL HIGH (ref 0.61–1.24)
GFR calc Af Amer: 54 mL/min — ABNORMAL LOW (ref >60)
GFR, EST NON AFRICAN AMERICAN: 47 mL/min — AB (ref >60)
Glucose, Bld: 131 mg/dL — ABNORMAL HIGH (ref 70–99)
Potassium: 3.6 mmol/L (ref 3.5–5.1)
Sodium: 141 mmol/L (ref 135–145)

## 2018-01-26 MED ORDER — AMLODIPINE BESYLATE 5 MG PO TABS: 5.0000 mg | ORAL_TABLET | Freq: Every day | ORAL | 11 refills | Status: AC

## 2018-01-26 NOTE — Patient Instructions (Addendum)
START Amlodipine 5 mg, one daily  Labs today We will only contact you if something comes back abnormal or we need to make some changes. Otherwise no news is good news!  Your physician recommends that you schedule a follow-up appointment in: 2 months with Dr Shirlee Latch   Do the following things EVERYDAY: 1) Weigh yourself in the morning before breakfast. Write it down and keep it in a log. 2) Take your medicines as prescribed 3) Eat low salt foods-Limit salt (sodium) to 2000 mg per day.  4) Stay as active as you can everyday 5) Limit all fluids for the day to less than 2 liters

## 2018-01-26 NOTE — Progress Notes (Signed)
Advanced Heart Failure Clinic Note PCP: O'Buch Pulmonary: Dr. [name] Cardiology: Dr. Fran Lowes is a 57 y.o. 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  He was followed in [facility] in the past for cardiology. Dr. [name] has been following him for pulmonary fibrosis. He had ARDs twice in 2016, once 1/16-2/16.  The second time, he had PNA developing into ARDS and was in the hospital for 3 months.  High resolution CT done 2/18 showed pulmonary fibrosis.  He is on home oxygen during the day and CPAP (for OSA) at night.  He is on prednisone 5 mg daily for pulmonary fibrosis.   Taken for RHC in 12/17.  This showed mildly elevated filling pressures and pulmonary venous hypertension.   He has also had atrial arrhythmias, both atrial fibrillation and atrial flutter.  He has been cardioverted several times.  He has been on ranolazine and Multaq, but his insurance company said that it would not cover Multaq any longer and he stopped it.  He had DCCV to NSR in 3/18 but was back in atrial fibrillation in 5/18.  I had him evaluated by EP and he was admitted for dofetilide load.  Now on dofetilide.   He was seen by pulmonology at [facility] in 2/19.  Chest CT was done, per the pulmonology note, there was evidence of inflammation.  It was an unusual ILD pattern for post-ARDS fibrosis, there was concern for NSIP versus hypersensitivity pneumonitis. He had evidence for mold sensitization.  Echo in 3/19 showed +bubble study concerning for PFO (weakly positive), EF 45-50%, moderate LV dilation, mildly decreased RV systolic function.  Prednisone was increased and he was started on dapsone.   Recurrent afib in 5/19, underwent DCCV.    He presents today for regular follow up. Weight down 8 lbs from last visit. Overall he is feeling  well. He had PCP visit several weeks ago and did a walk test. Reports his HR went up into the "200s" by pulse ox, and he felt bad for the rest of the weekend. He gradually felt better and has not had any similar symptoms since. Denies CP. Using nightly CPAP and Home O2 @ 7 L via [place]. EKG with NSR today. States he is taking all medications as directed.   ECG: NSR with PACs, 85 bpm, QTc 490, personally reviewed.   Labs (12/17): K 4.6, creatinine 1.98, hgb 11.5 Labs (3/18): K 4.2, creatinine 1.79, BNP 53, hgb 13.4 Labs (4/18): K 4.5, creatinine 1.8 Labs (5/18): K 4.2, creatinine 1.77, Mg 1.9 Labs (6/18): K 3.9, creatinine 1.5 Labs (12/18): K 4.3, creatinine 2.01 Labs (2/19): ANA negative, RF negative Labs (4/19): LDL 118, TGs 266 Labs (5/19): K 3.7, creatinine 1.8  PMH: 1. PUD 2. GERD 3. OSA: Using CPAP.  4. ARDS: 1/16-2/16 initially, then admitted again in 9/16 for 3 months with PNA and ARDS.  He developed post-inflammatory pulmonary fibrosis. 5. Pulmonary fibrosis: Thought initially to be post-inflammatory pulmonary fibrosis developing after PNA and ARDS.  - High resolution CT chest (2/18): Pulmonary fibrosis noted, looks like NSIP versus chronic hypersensitivity pneumonitis.  - On home oxygen.  - CT at [facility] in 2/19 was more suggestive of inflammation: It was an unusual ILD pattern for post-ARDS fibrosis, there was concern for NSIP versus hypersensitivity pneumonitis. 6. Ascending aortic aneurysm:  4.6 cm on  CT in 2/18.  7. Atrial arrhythmias: Atrial fibrillation and atrial flutter have both been noted.  He has had several cardioversions in the past.  Was followed at [facility], was on ranolazine + Multaq, but insurance will not cover Multaq.  - DCCV 3/18 to NSR, back in atrial fibrillation by 5/18.  - Dofetilide begun, now in NSR.  - DCCV 5/19 8. Cardiomyopathy with chronic primarily diastolic CHF:  - Echo in [place] in the past with EF in 45% range.  - Echo (9/16) with EF 50-55%,  moderate biatrial enlargement.  - RHC (12/17): mean 12, PA 44/24 mean 33, mean PCWP 22, CI 3.04, PVR 1.3 WU. - Echo (3/18): EF 50%, diffuse hypokinesis, normal RV size and systolic function.  - Echo in 1/61 (at [facility]) showed +bubble study concerning for PFO (weakly positive), EF 45-50%, moderate LV dilation, mildly decreased RV systolic function.   9. CKD: Stage III.   SH: Nonsmoker, no ETOH.  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   Family History  Problem Relation Age of Onset  . Heart disease Mother   . Diabetes Mother   . Hypertension Mother    Review of systems complete and found to be negative unless listed in HPI.    Current Outpatient Medications  Medication Sig Dispense Refill  . dofetilide (TIKOSYN) 500 MCG capsule TAKE 1 CAPSULE BY MOUTH EVERY 12 HOURS 180 capsule 1  . famotidine (PEPCID) 20 MG tablet Take 1 tablet (20 mg total) by mouth 2 (two) times daily. 60 tablet 1  . Febuxostat (ULORIC) 80 MG TABS Take 80 mg by mouth daily.     . fenofibrate (TRICOR) 145 MG tablet Take 1 tablet (145 mg total) by mouth daily. 30 tablet 1  . furosemide (LASIX) 40 MG tablet Take 2 tabs in AM and 1 tab in PM 90 tablet 3  . HYDROcodone-acetaminophen (NORCO) 10-325 MG tablet Take 1 tablet by mouth 4 (four) times daily.    . magnesium oxide (MAGNESIUM-OXIDE) 400 (241.3 Mg) MG tablet Take 0.5 tablets (200 mg total) by mouth daily. 15 tablet 6  . metoprolol succinate (TOPROL-XL) 25 MG 24 hr tablet Take 1 tablet (25 mg total) by mouth daily. Take with or immediately following a meal. 30 tablet 3  . omeprazole (PRILOSEC) 20 MG capsule Take 20 mg by mouth 2 (two) times daily.    . OXYGEN Inhale 7 L into the lungs continuous.    . potassium chloride SA (K-DUR,KLOR-CON) 20 MEQ tablet Take 2 tablets (40 mEq total) by mouth 2 (two) times daily. 60 tablet 6  . predniSONE (DELTASONE) 20 MG tablet Take 40 mg by mouth daily with breakfast.    . pregabalin (LYRICA) 200 MG  capsule Take 1 capsule (200 mg total) by mouth 3 (three) times daily. Take 1 capsule three times a day. 90 capsule 0  . ranolazine (RANEXA) 1000 MG SR tablet Take 1 tablet (1,000 mg total) by mouth 2 (two) times daily. 60 tablet 6  . rivaroxaban (XARELTO) 20 MG TABS tablet Take 20 mg by mouth daily.     Marland Kitchen testosterone cypionate (DEPOTESTOSTERONE CYPIONATE) 200 MG/ML injection Inject 200 mg into the muscle every Wednesday.      No current facility-administered medications for this encounter.    Vitals:   01/26/18 0911  BP: (!) 160/90  Pulse: 86  SpO2: 94%  Weight: (!) 163.6 kg (360 lb 9.6 oz)   Wt Readings from Last 3 Encounters:  01/26/18 (!) 163.6 kg (360 lb 9.6 oz)  01/05/18 (!) 167.1 kg (368 lb 6 oz)  11/06/17 (!) 161.9 kg (357 lb)    General: Well appearing. No resp difficulty. HEENT: Normal Neck: Supple. JVP difficult. Carotids 2+ bilat; no bruits. No thyromegaly or nodule noted. Cor: PMI nonpalpable. RRR, No M/G/R noted Lungs: CTAB, normal effort. Abdomen: Soft, non-tender, non-distended, no HSM. No bruits or masses. +BS  Extremities: No cyanosis, clubbing, or rash. 1+ chronic edema.   Neuro: Alert & orientedx3, cranial nerves grossly intact. moves all 4 extremities w/o difficulty. Affect pleasant   Assessment/Plan: 1. Pulmonary fibrosis:  - Noted on HRCT.  He is on home oxygen. Had suspected post-ARDS pulmonary fibrosis. Recently evaluated at [facility], where repeat CT showed evidence of more active inflammation with ?NSIP versus hypersensitivity pneumonitis.   - Prednisone was increased to 40 mg daily and he has started on dapsone.   - No change.  2. Atrial fibrillation and flutter:  - Not an ablation candidate given size, pulmonary disease.  Patient was cardioverted in 3/18 to NSR but was back in atrial fibrillation by 5/18.  He is now on dofetilide and ranolazine.  He had repeat DCCV in 5/19.  - Continue dofetilide, QTc OK today. - He is on ranolazine in addition to  dofetilide, per Dr. Graciela Husbands this will be ok to continue as long as QT interval is not excessively prolonged => QTc 490 msec today.   - Recheck BMET Mg.  - Continue Xarelto.  - Continue Toprol XL.  3. Chronic primarily diastolic CHF:  - Diastolic CHF with pulmonary venous hypertension on 12/17 echo.  Most recent echo in 3/19 at [facility] with EF 45-50%, mildly decreased RV systolic function.   - NYHA III symptoms chronically with chronic hypoxic resp failure - Volume status stable on exam.   - Continue lasix 80 qam, 40 qpm. Can take extra as needed   - Continue KCl 40 bid. BMET today. 4. CKD: Stage III - BMET today.   5. Obesity:   - Body mass index is 48.91 kg/m.  - Needs to lose weight.  6. PFO:  - PFO noted on last echo with positive bubble study. Per report, it did not appear large.   - Will follow without correction for now, Per Dr. Shirlee Latch: do not think that it is creating significant shunting.  7. HTN - Add amlodipine 5 mg daily.   Labs and meds as above. RTC 2 months.   Graciella Freer, PA-C  01/26/2018   Greater than 50% of the 25 minute visit was spent in counseling/coordination of care regarding disease state education, salt/fluid restriction, sliding scale diuretics, and medication compliance.

## 2018-02-07 ENCOUNTER — Encounter: Payer: Self-pay | Admitting: Internal Medicine

## 2018-02-07 ENCOUNTER — Ambulatory Visit (INDEPENDENT_AMBULATORY_CARE_PROVIDER_SITE_OTHER): Payer: Medicaid Other | Admitting: Internal Medicine

## 2018-02-07 VITALS — BP 132/80 | HR 78 | Ht 72.0 in | Wt 358.2 lb

## 2018-02-07 DIAGNOSIS — I4819 Other persistent atrial fibrillation: Secondary | ICD-10-CM

## 2018-02-07 DIAGNOSIS — I481 Persistent atrial fibrillation: Secondary | ICD-10-CM

## 2018-02-07 DIAGNOSIS — N289 Disorder of kidney and ureter, unspecified: Secondary | ICD-10-CM | POA: Diagnosis not present

## 2018-02-07 DIAGNOSIS — I483 Typical atrial flutter: Secondary | ICD-10-CM

## 2018-02-07 DIAGNOSIS — I428 Other cardiomyopathies: Secondary | ICD-10-CM | POA: Diagnosis not present
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
## 2018-02-07 NOTE — Patient Instructions (Signed)

## 2018-02-19 ENCOUNTER — Other Ambulatory Visit: Payer: Self-pay | Admitting: *Deleted

## 2018-02-19 MED ORDER — RANOLAZINE ER 1000 MG PO TB12: 1000.0000 mg | ORAL_TABLET | Freq: Two times a day (BID) | ORAL | 5 refills | Status: AC

## 2018-03-15 ENCOUNTER — Other Ambulatory Visit: Payer: Self-pay

## 2018-03-15 ENCOUNTER — Encounter (HOSPITAL_COMMUNITY): Payer: Self-pay | Admitting: Cardiology

## 2018-03-15 ENCOUNTER — Ambulatory Visit (HOSPITAL_COMMUNITY)
Admission: RE | Admit: 2018-03-15 | Discharge: 2018-03-15 | Disposition: A | Discharge status: Home / Self Care | Payer: Medicaid Other | Source: Ambulatory Visit | Attending: Cardiology | Admitting: Cardiology

## 2018-03-15 VITALS — BP 130/77 | HR 85 | Wt 358.0 lb

## 2018-03-15 DIAGNOSIS — I4892 Unspecified atrial flutter: Secondary | ICD-10-CM | POA: Diagnosis not present

## 2018-03-15 DIAGNOSIS — J8410 Pulmonary fibrosis, unspecified: Secondary | ICD-10-CM | POA: Insufficient documentation

## 2018-03-15 DIAGNOSIS — Z79891 Long term (current) use of opiate analgesic: Secondary | ICD-10-CM | POA: Diagnosis not present

## 2018-03-15 DIAGNOSIS — E669 Obesity, unspecified: Secondary | ICD-10-CM | POA: Diagnosis not present

## 2018-03-15 DIAGNOSIS — Z9981 Dependence on supplemental oxygen: Secondary | ICD-10-CM | POA: Insufficient documentation

## 2018-03-15 DIAGNOSIS — G4733 Obstructive sleep apnea (adult) (pediatric): Secondary | ICD-10-CM | POA: Insufficient documentation

## 2018-03-15 DIAGNOSIS — I5032 Chronic diastolic (congestive) heart failure: Secondary | ICD-10-CM | POA: Diagnosis present

## 2018-03-15 DIAGNOSIS — R9431 Abnormal electrocardiogram [ECG] [EKG]: Secondary | ICD-10-CM | POA: Insufficient documentation

## 2018-03-15 DIAGNOSIS — Z79899 Other long term (current) drug therapy: Secondary | ICD-10-CM | POA: Diagnosis not present

## 2018-03-15 DIAGNOSIS — I130 Hypertensive heart and chronic kidney disease with heart failure and stage 1 through stage 4 chronic kidney disease, or unspecified chronic kidney disease: Secondary | ICD-10-CM | POA: Diagnosis present

## 2018-03-15 DIAGNOSIS — Z7952 Long term (current) use of systemic steroids: Secondary | ICD-10-CM | POA: Diagnosis not present

## 2018-03-15 DIAGNOSIS — Z8249 Family history of ischemic heart disease and other diseases of the circulatory system: Secondary | ICD-10-CM | POA: Insufficient documentation

## 2018-03-15 DIAGNOSIS — Z7901 Long term (current) use of anticoagulants: Secondary | ICD-10-CM | POA: Diagnosis not present

## 2018-03-15 DIAGNOSIS — Z6841 Body Mass Index (BMI) 40.0 and over, adult: Secondary | ICD-10-CM | POA: Insufficient documentation

## 2018-03-15 DIAGNOSIS — I4891 Unspecified atrial fibrillation: Secondary | ICD-10-CM | POA: Diagnosis not present

## 2018-03-15 DIAGNOSIS — Q211 Atrial septal defect: Secondary | ICD-10-CM | POA: Insufficient documentation

## 2018-03-15 DIAGNOSIS — K279 Peptic ulcer, site unspecified, unspecified as acute or chronic, without hemorrhage or perforation: Secondary | ICD-10-CM | POA: Diagnosis not present

## 2018-03-15 DIAGNOSIS — Z833 Family history of diabetes mellitus: Secondary | ICD-10-CM | POA: Insufficient documentation

## 2018-03-15 DIAGNOSIS — I2729 Other secondary pulmonary hypertension: Secondary | ICD-10-CM

## 2018-03-15 DIAGNOSIS — N183 Chronic kidney disease, stage 3 (moderate): Secondary | ICD-10-CM | POA: Insufficient documentation

## 2018-03-15 DIAGNOSIS — K219 Gastro-esophageal reflux disease without esophagitis: Secondary | ICD-10-CM | POA: Insufficient documentation

## 2018-03-15 LAB — BASIC METABOLIC PANEL
Anion gap: 13 (ref 5–15)
BUN: 14 mg/dL (ref 6–20)
CALCIUM: 9.1 mg/dL (ref 8.9–10.3)
CO2: 33 mmol/L — ABNORMAL HIGH (ref 22–32)
CREATININE: 1.66 mg/dL — AB (ref 0.61–1.24)
Chloride: 93 mmol/L — ABNORMAL LOW (ref 98–111)
GFR calc Af Amer: 52 mL/min — ABNORMAL LOW (ref >60)
GFR, EST NON AFRICAN AMERICAN: 45 mL/min — AB (ref >60)
GLUCOSE: 99 mg/dL (ref 70–99)
POTASSIUM: 3.9 mmol/L (ref 3.5–5.1)
SODIUM: 139 mmol/L (ref 135–145)

## 2018-03-15 LAB — CBC
HCT: 49.9 % (ref 39.0–52.0)
Hemoglobin: 14.1 g/dL (ref 13.0–17.0)
MCH: 23.7 pg — AB (ref 26.0–34.0)
MCHC: 28.3 g/dL — ABNORMAL LOW (ref 30.0–36.0)
MCV: 83.7 fL (ref 80.0–100.0)
PLATELETS: 285 10*3/uL (ref 150–400)
RBC: 5.96 MIL/uL — AB (ref 4.22–5.81)
RDW: 17.7 % — AB (ref 11.5–15.5)
WBC: 10.3 10*3/uL (ref 4.0–10.5)
nRBC: 0 % (ref 0.0–0.2)

## 2018-03-15 LAB — MAGNESIUM: MAGNESIUM: 2.1 mg/dL (ref 1.7–2.4)

## 2018-03-15 NOTE — Patient Instructions (Signed)
Labs today  Your physician recommends that you schedule a follow-up appointment in: 3 months  

## 2018-03-15 NOTE — Progress Notes (Signed)
PCP: O'Buch Pulmonary: Dr. [name] Cardiology: Dr. Shirlee Latch  57 y.o. 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  He was followed in [facility] in the past for cardiology. Dr. [name] has been following him for pulmonary fibrosis. He had ARDs twice in 2016, once 1/16-2/16.  The second time, he had PNA developing into ARDS and was in the hospital for 3 months.  High resolution CT done 2/18 showed pulmonary fibrosis.  He is on home oxygen during the day and CPAP (for OSA) at night.  He is on prednisone 5 mg daily for pulmonary fibrosis.   I took him for RHC in 12/17.  This showed mildly elevated filling pressures and pulmonary venous hypertension.   He has also had atrial arrhythmias, both atrial fibrillation and atrial flutter.  He has been cardioverted several times.  He has been on ranolazine and Multaq, but his insurance company said that it would not cover Multaq any longer and he stopped it.  He had DCCV to NSR in 3/18 but was back in atrial fibrillation in 5/18.  I had him evaluated by EP and he was admitted for dofetilide load.  Now on dofetilide.   He was seen by pulmonology at [facility] in 2/19.  Chest CT was done, per the pulmonology note, there was evidence of inflammation.  It was an unusual ILD pattern for post-ARDS fibrosis, there was concern for NSIP versus hypersensitivity pneumonitis. He had evidence for mold sensitization.  Echo in 3/19 showed +bubble study concerning for PFO (weakly positive), EF 45-50%, moderate LV dilation, mildly decreased RV systolic function.  Prednisone was increased and he was started on dapsone.   Recurrent afib in 5/19, underwent DCCV.    Patient has been stable recently.  He remains in NSR on Tikosyn.  He feels like he is in rhythm most of the time.  He has generalized fatigue; he is now taking  care of his elderly father who has dementia.  Stable breathing, still using 7L oxygen by [place].  He is using CPAP, will be getting Bipap.  Weight down 2 lbs.  Prednisone has been decreased to 5 mg daily.  No BRBPR/melena.   ECG (personally reviewed): NSR, QTc 486 msec  Labs (12/17): K 4.6, creatinine 1.98, hgb 11.5 Labs (3/18): K 4.2, creatinine 1.79, BNP 53, hgb 13.4 Labs (4/18): K 4.5, creatinine 1.8 Labs (5/18): K 4.2, creatinine 1.77, Mg 1.9 Labs (6/18): K 3.9, creatinine 1.5 Labs (12/18): K 4.3, creatinine 2.01 Labs (2/19): ANA negative, RF negative Labs (4/19): LDL 118, TGs 266 Labs (5/19): K 3.7, creatinine 1.8 Labs (8/19): K 3.6, creatinine 1.62  PMH: 1. PUD 2. GERD 3. OSA: Using CPAP.  4. ARDS: 1/16-2/16 initially, then admitted again in 9/16 for 3 months with PNA and ARDS.  He developed post-inflammatory pulmonary fibrosis. 5. Pulmonary fibrosis: Thought initially to be post-inflammatory pulmonary fibrosis developing after PNA and ARDS.  - High resolution CT chest (2/18): Pulmonary fibrosis noted, looks like NSIP versus chronic hypersensitivity pneumonitis.  - On home oxygen.  - CT at [facility] in 2/19 was more suggestive of inflammation: It was an unusual ILD pattern for post-ARDS fibrosis, there was concern for NSIP versus hypersensitivity pneumonitis. 6. Ascending aortic aneurysm:  4.6 cm on CT in 2/18.  7. Atrial arrhythmias: Atrial fibrillation and atrial flutter have both been noted.  He has had several  cardioversions in the past.  Was followed at [facility], was on ranolazine + Multaq, but insurance will not cover Multaq.  - DCCV 3/18 to NSR, back in atrial fibrillation by 5/18.  - Dofetilide begun, now in NSR.  - DCCV 5/19 8. Cardiomyopathy with chronic primarily diastolic CHF:  - Echo in [place] in the past with EF in 45% range.  - Echo (9/16) with EF 50-55%, moderate biatrial enlargement.  - RHC (12/17): mean 12, PA 44/24 mean 33, mean PCWP 22, CI 3.04, PVR 1.3 WU. -  Echo (3/18): EF 50%, diffuse hypokinesis, normal RV size and systolic function.  - Echo in 1/61 (at [facility]) showed +bubble study concerning for PFO (weakly positive), EF 45-50%, moderate LV dilation, mildly decreased RV systolic function.   9. CKD: Stage III.   SH: Nonsmoker, no ETOH.  He used to be an Personnel officer, now on disability.  Lives in [place].   Family History  Problem Relation Age of Onset  . Heart disease Mother   . Diabetes Mother   . Hypertension Mother    ROS: All systems reviewed and negative except as per HPI.   Current Outpatient Medications  Medication Sig Dispense Refill  . amLODipine (NORVASC) 5 MG tablet Take 1 tablet (5 mg total) by mouth daily. 30 tablet 11  . dofetilide (TIKOSYN) 500 MCG capsule TAKE 1 CAPSULE BY MOUTH EVERY 12 HOURS 180 capsule 1  . famotidine (PEPCID) 20 MG tablet Take 1 tablet (20 mg total) by mouth 2 (two) times daily. 60 tablet 1  . Febuxostat (ULORIC) 80 MG TABS Take 80 mg by mouth daily.     . fenofibrate (TRICOR) 145 MG tablet Take 1 tablet (145 mg total) by mouth daily. 30 tablet 1  . furosemide (LASIX) 40 MG tablet Take 2 tabs in AM and 1 tab in PM 90 tablet 3  . HYDROcodone-acetaminophen (NORCO) 10-325 MG tablet Take 1 tablet by mouth 4 (four) times daily.    . magnesium oxide (MAGNESIUM-OXIDE) 400 (241.3 Mg) MG tablet Take 0.5 tablets (200 mg total) by mouth daily. 15 tablet 6  . metoprolol succinate (TOPROL-XL) 25 MG 24 hr tablet Take 1 tablet (25 mg total) by mouth daily. Take with or immediately following a meal. 30 tablet 3  . omeprazole (PRILOSEC) 20 MG capsule Take 20 mg by mouth 2 (two) times daily.    . OXYGEN Inhale 7 L into the lungs continuous.    . potassium chloride SA (K-DUR,KLOR-CON) 20 MEQ tablet Take 2 tablets (40 mEq total) by mouth 2 (two) times daily. 60 tablet 6  . predniSONE (DELTASONE) 20 MG tablet Take 40 mg by mouth daily with breakfast.    . pregabalin (LYRICA) 200 MG capsule Take 1 capsule (200 mg total)  by mouth 3 (three) times daily. Take 1 capsule three times a day. 90 capsule 0  . ranolazine (RANEXA) 1000 MG SR tablet Take 1 tablet (1,000 mg total) by mouth 2 (two) times daily. 60 tablet 5  . rivaroxaban (XARELTO) 20 MG TABS tablet Take 20 mg by mouth daily.     Marland Kitchen testosterone cypionate (DEPOTESTOSTERONE CYPIONATE) 200 MG/ML injection Inject 200 mg into the muscle every Wednesday.      No current facility-administered medications for this encounter.    BP 130/77   Pulse 85   Wt (!) 162.4 kg (358 lb)   SpO2 93% Comment: on 7L O2  BMI 48.55 kg/m  General: NAD Neck: Thick, no JVD, no thyromegaly or thyroid nodule.  Lungs: Clear to auscultation bilaterally with normal respiratory effort. CV: Nondisplaced PMI.  Heart regular S1/S2, no S3/S4, no murmur.  Trace ankle edema.  No carotid bruit.  Normal pedal pulses.  Abdomen: Soft, nontender, no hepatosplenomegaly, no distention.  Skin: Intact without lesions or rashes.  Neurologic: Alert and oriented x 3.  Psych: Normal affect. Extremities: No clubbing or cyanosis.  HEENT: Normal.   Assessment/Plan: 1. Pulmonary fibrosis: Noted on HRCT.  He is on home oxygen. Had suspected post-ARDS pulmonary fibrosis. Evaluated at [facility], where repeat CT showed evidence of more active inflammation with ?NSIP versus hypersensitivity pneumonitis.  Will have [facility] pulmonary followup in 11/19.  - Prednisone has been decreased to 5 mg daily.  2. Atrial fibrillation and flutter: Not an ablation candidate given size, pulmonary disease.  Patient was cardioverted in 3/18 to NSR but was back in atrial fibrillation by 5/18.  He is now on dofetilide and ranolazine.  He had repeat DCCV in 5/19.  He has been in NSR recently.  - Continue dofetilide, QTc ok today. - He is on ranolazine in addition to dofetilide, per Dr. Graciela Husbands this will be ok to continue as long as QT interval is not excessively prolonged => QTc 486 msec today.   - Check BMET/Mg today.  - Continue Xarelto.  -  Continue Toprol XL.  3. Chronic primarily diastolic CHF: Diastolic CHF with pulmonary venous hypertension on 12/17 echo.  Most recent echo in 3/19 at [facility] with EF 45-50%, mildly decreased RV systolic function.  NYHA class II-III, stable.  Volume looks ok on exam and weight has been trending down.   - Continue Lasix 80 qam/40 qpm.   4. CKD: Stage III.  BMET today.  5. Obesity:  I think weight loss is imperative for him.  6. PFO: PFO noted on last echo with positive bubble study. Per report, it did not appear large.  Will follow without correction for now, I do not think that it is creating significant shunting.   Followup 3 mos   Marca Ancona 03/15/2018

## 2018-05-04 ENCOUNTER — Other Ambulatory Visit: Payer: Self-pay | Admitting: *Deleted

## 2018-05-04 DIAGNOSIS — I5032 Chronic diastolic (congestive) heart failure: Secondary | ICD-10-CM

## 2018-05-04 MED ORDER — FUROSEMIDE 40 MG PO TABS
ORAL_TABLET | ORAL | 3 refills | Status: DC
Start: 2018-05-04 — End: 2018-06-15

## 2018-06-15 ENCOUNTER — Encounter (HOSPITAL_COMMUNITY): Payer: Self-pay

## 2018-06-15 ENCOUNTER — Other Ambulatory Visit: Payer: Self-pay

## 2018-06-15 ENCOUNTER — Ambulatory Visit (HOSPITAL_COMMUNITY)
Admission: RE | Admit: 2018-06-15 | Discharge: 2018-06-15 | Disposition: A | Discharge status: Home / Self Care | Payer: Medicaid Other | Source: Ambulatory Visit | Attending: Internal Medicine | Admitting: Internal Medicine

## 2018-06-15 VITALS — BP 124/72 | HR 76 | Wt 335.2 lb

## 2018-06-15 DIAGNOSIS — G4733 Obstructive sleep apnea (adult) (pediatric): Secondary | ICD-10-CM | POA: Insufficient documentation

## 2018-06-15 DIAGNOSIS — N289 Disorder of kidney and ureter, unspecified: Secondary | ICD-10-CM

## 2018-06-15 DIAGNOSIS — I712 Thoracic aortic aneurysm, without rupture: Secondary | ICD-10-CM | POA: Diagnosis not present

## 2018-06-15 DIAGNOSIS — K219 Gastro-esophageal reflux disease without esophagitis: Secondary | ICD-10-CM | POA: Diagnosis not present

## 2018-06-15 DIAGNOSIS — I2729 Other secondary pulmonary hypertension: Secondary | ICD-10-CM | POA: Diagnosis not present

## 2018-06-15 DIAGNOSIS — Q211 Atrial septal defect: Secondary | ICD-10-CM | POA: Diagnosis not present

## 2018-06-15 DIAGNOSIS — I429 Cardiomyopathy, unspecified: Secondary | ICD-10-CM | POA: Insufficient documentation

## 2018-06-15 DIAGNOSIS — I5032 Chronic diastolic (congestive) heart failure: Secondary | ICD-10-CM | POA: Diagnosis not present

## 2018-06-15 DIAGNOSIS — I4892 Unspecified atrial flutter: Secondary | ICD-10-CM | POA: Insufficient documentation

## 2018-06-15 DIAGNOSIS — Z8711 Personal history of peptic ulcer disease: Secondary | ICD-10-CM | POA: Diagnosis not present

## 2018-06-15 DIAGNOSIS — Z6841 Body Mass Index (BMI) 40.0 and over, adult: Secondary | ICD-10-CM | POA: Insufficient documentation

## 2018-06-15 DIAGNOSIS — Z79899 Other long term (current) drug therapy: Secondary | ICD-10-CM | POA: Diagnosis not present

## 2018-06-15 DIAGNOSIS — Z7901 Long term (current) use of anticoagulants: Secondary | ICD-10-CM | POA: Insufficient documentation

## 2018-06-15 DIAGNOSIS — Z9981 Dependence on supplemental oxygen: Secondary | ICD-10-CM | POA: Insufficient documentation

## 2018-06-15 DIAGNOSIS — N183 Chronic kidney disease, stage 3 (moderate): Secondary | ICD-10-CM | POA: Diagnosis not present

## 2018-06-15 DIAGNOSIS — I130 Hypertensive heart and chronic kidney disease with heart failure and stage 1 through stage 4 chronic kidney disease, or unspecified chronic kidney disease: Secondary | ICD-10-CM | POA: Insufficient documentation

## 2018-06-15 DIAGNOSIS — I4891 Unspecified atrial fibrillation: Secondary | ICD-10-CM | POA: Diagnosis not present

## 2018-06-15 DIAGNOSIS — J8410 Pulmonary fibrosis, unspecified: Secondary | ICD-10-CM | POA: Diagnosis not present

## 2018-06-15 DIAGNOSIS — E669 Obesity, unspecified: Secondary | ICD-10-CM | POA: Diagnosis not present

## 2018-06-15 DIAGNOSIS — I480 Paroxysmal atrial fibrillation: Secondary | ICD-10-CM

## 2018-06-15 DIAGNOSIS — Z8249 Family history of ischemic heart disease and other diseases of the circulatory system: Secondary | ICD-10-CM | POA: Diagnosis not present

## 2018-06-15 LAB — BASIC METABOLIC PANEL
ANION GAP: 15 (ref 5–15)
BUN: 15 mg/dL (ref 6–20)
CHLORIDE: 91 mmol/L — AB (ref 98–111)
CO2: 33 mmol/L — ABNORMAL HIGH (ref 22–32)
Calcium: 9 mg/dL (ref 8.9–10.3)
Creatinine, Ser: 1.43 mg/dL — ABNORMAL HIGH (ref 0.61–1.24)
GFR calc Af Amer: >60 mL/min (ref >60)
GFR calc non Af Amer: 55 mL/min — ABNORMAL LOW (ref >60)
GLUCOSE: 103 mg/dL — AB (ref 70–99)
POTASSIUM: 3 mmol/L — AB (ref 3.5–5.1)
Sodium: 139 mmol/L (ref 135–145)

## 2018-06-15 LAB — MAGNESIUM: MAGNESIUM: 2.1 mg/dL (ref 1.7–2.4)

## 2018-06-15 MED ORDER — POTASSIUM CHLORIDE CRYS ER 20 MEQ PO TBCR: 40.0000 meq | EXTENDED_RELEASE_TABLET | Freq: Two times a day (BID) | ORAL | 11 refills | Status: AC

## 2018-06-15 MED ORDER — FUROSEMIDE 80 MG PO TABS: 80.0000 mg | ORAL_TABLET | Freq: Two times a day (BID) | ORAL | 6 refills | Status: AC

## 2018-06-15 NOTE — Progress Notes (Signed)
PCP: O'Buch Pulmonary: Dr. [name] Cardiology: Dr. Shirlee Latch  58 y.o. 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  He was followed in [facility] in the past for cardiology. Dr. [name] has been following him for pulmonary fibrosis. He had ARDs twice in 2016, once 1/16-2/16.  The second time, he had PNA developing into ARDS and was in the hospital for 3 months.  High resolution CT done 2/18 showed pulmonary fibrosis.  He is on home oxygen during the day and CPAP (for OSA) at night.  He is on prednisone 5 mg daily for pulmonary fibrosis.   I took him for RHC in 12/17.  This showed mildly elevated filling pressures and pulmonary venous hypertension.   He has also had atrial arrhythmias, both atrial fibrillation and atrial flutter.  He has been cardioverted several times.  He has been on ranolazine and Multaq, but his insurance company said that it would not cover Multaq any longer and he stopped it.  He had DCCV to NSR in 3/18 but was back in atrial fibrillation in 5/18.  I had him evaluated by EP and he was admitted for dofetilide load.  Now on dofetilide.   He was seen by pulmonology at [facility] in 2/19.  Chest CT was done, per the pulmonology note, there was evidence of inflammation.  It was an unusual ILD pattern for post-ARDS fibrosis, there was concern for NSIP versus hypersensitivity pneumonitis. He had evidence for mold sensitization.  Echo in 3/19 showed +bubble study concerning for PFO (weakly positive), EF 45-50%, moderate LV dilation, mildly decreased RV systolic function.  Prednisone was increased and he was started on dapsone.   Recurrent afib in 5/19, underwent DCCV.   Today he returns for HF follow up. Last month he was seen by pulmonary and his lasix was increased to 80 mg twice a day. Overall feeling fine. Remains SOB with  exertion. Denies PND/Orthopnea. Uses 7 liters oxygen and increased to 10 liters with ambulation. Denies syncope/presyncope. Appetite ok. No bleeding issues. No fever or chills. Weight at home has been going down 10-15 pounds. Weight has gone down to 332 pounds. . Taking all medications but has been out of potassium for the last 4 weeks.    ECG : SR with PAC 82 bpm QtC 493.   Labs (12/17): K 4.6, creatinine 1.98, hgb 11.5 Labs (3/18): K 4.2, creatinine 1.79, BNP 53, hgb 13.4 Labs (4/18): K 4.5, creatinine 1.8 Labs (5/18): K 4.2, creatinine 1.77, Mg 1.9 Labs (6/18): K 3.9, creatinine 1.5 Labs (12/18): K 4.3, creatinine 2.01 Labs (2/19): ANA negative, RF negative Labs (4/19): LDL 118, TGs 266 Labs (5/19): K 3.7, creatinine 1.8 Labs (8/19): K 3.6, creatinine 1.62 Labs (02/2018): K 3.9 Creatinine 1.66  PMH: 1. PUD 2. GERD 3. OSA: Using CPAP.  4. ARDS: 1/16-2/16 initially, then admitted again in 9/16 for 3 months with PNA and ARDS.  He developed post-inflammatory pulmonary fibrosis. 5. Pulmonary fibrosis: Thought initially to be post-inflammatory pulmonary fibrosis developing after PNA and ARDS.  - High resolution CT chest (2/18): Pulmonary fibrosis noted, looks like NSIP versus chronic hypersensitivity pneumonitis.  - On home oxygen.  - CT at [facility] in 2/19 was more suggestive of inflammation: It was an unusual ILD pattern for post-ARDS fibrosis, there was concern for NSIP versus hypersensitivity pneumonitis. 6. Ascending aortic aneurysm:  4.6 cm on CT  in 2/18.  7. Atrial arrhythmias: Atrial fibrillation and atrial flutter have both been noted.  He has had several cardioversions in the past.  Was followed at [facility], was on ranolazine + Multaq, but insurance will not cover Multaq.  - DCCV 3/18 to NSR, back in atrial fibrillation by 5/18.  - Dofetilide begun, now in NSR.  - DCCV 5/19 8. Cardiomyopathy with chronic primarily diastolic CHF:  - Echo in [place] in the past with EF in 45%  range.  - Echo (9/16) with EF 50-55%, moderate biatrial enlargement.  - RHC (12/17): mean 12, PA 44/24 mean 33, mean PCWP 22, CI 3.04, PVR 1.3 WU. - Echo (3/18): EF 50%, diffuse hypokinesis, normal RV size and systolic function.  - Echo in 4/74 (at [facility]) showed +bubble study concerning for PFO (weakly positive), EF 45-50%, moderate LV dilation, mildly decreased RV systolic function.   9. CKD: Stage III.   SH: Nonsmoker, no ETOH.  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   Family History  Problem Relation Age of Onset  . Heart disease Mother   . Diabetes Mother   . Hypertension Mother    ROS: All systems reviewed and negative except as per HPI.   Current Outpatient Medications  Medication Sig Dispense Refill  . amLODipine (NORVASC) 5 MG tablet Take 1 tablet (5 mg total) by mouth daily. 30 tablet 11  . dofetilide (TIKOSYN) 500 MCG capsule TAKE 1 CAPSULE BY MOUTH EVERY 12 HOURS 180 capsule 1  . famotidine (PEPCID) 20 MG tablet Take 1 tablet (20 mg total) by mouth 2 (two) times daily. 60 tablet 1  . Febuxostat (ULORIC) 80 MG TABS Take 80 mg by mouth daily.     . fenofibrate (TRICOR) 145 MG tablet Take 1 tablet (145 mg total) by mouth daily. 30 tablet 1  . furosemide (LASIX) 40 MG tablet Take 2 tabs in AM and 1 tab in PM (Patient taking differently: 80 mg 2 (two) times daily. ) 90 tablet 3  . HYDROcodone-acetaminophen (NORCO) 10-325 MG tablet Take 1 tablet by mouth 4 (four) times daily.    . magnesium oxide (MAGNESIUM-OXIDE) 400 (241.3 Mg) MG tablet Take 0.5 tablets (200 mg total) by mouth daily. 15 tablet 6  . metoprolol succinate (TOPROL-XL) 25 MG 24 hr tablet Take 1 tablet (25 mg total) by mouth daily. Take with or immediately following a meal. 30 tablet 3  . omeprazole (PRILOSEC) 20 MG capsule Take 20 mg by mouth 2 (two) times daily.    . OXYGEN Inhale 7 L into the lungs continuous.    . predniSONE (DELTASONE) 5 MG tablet Take 5 mg by mouth daily with  breakfast.    . pregabalin (LYRICA) 200 MG capsule Take 1 capsule (200 mg total) by mouth 3 (three) times daily. Take 1 capsule three times a day. 90 capsule 0  . ranolazine (RANEXA) 1000 MG SR tablet Take 1 tablet (1,000 mg total) by mouth 2 (two) times daily. 60 tablet 5  . rivaroxaban (XARELTO) 20 MG TABS tablet Take 20 mg by mouth daily.     Marland Kitchen testosterone cypionate (DEPOTESTOSTERONE CYPIONATE) 200 MG/ML injection Inject 200 mg into the muscle every Wednesday.     . potassium chloride SA (K-DUR,KLOR-CON) 20 MEQ tablet Take 2 tablets (40 mEq total) by mouth 2 (two) times daily. (Patient not taking: Reported on 06/15/2018) 60 tablet 6   No current facility-administered medications for this encounter.    BP 124/72  Pulse 76   Wt (!) 152 kg (335 lb 3.2 oz)   SpO2 95% Comment: on 7L  BMI 45.46 kg/m   Wt Readings from Last 3 Encounters:  06/15/18 (!) 152 kg (335 lb 3.2 oz)  03/15/18 (!) 162.4 kg (358 lb)  02/07/18 (!) 162.5 kg (358 lb 3.2 oz)    General:  No resp difficulyt. Walked in the clinic wit oxygen.  HEENT: normal Neck: supple. JVP 5-6 Carotids 2+ bilat; no bruits. No lymphadenopathy or thryomegaly appreciated. Cor: PMI nondisplaced. Regular rate & rhythm. No rubs, gallops or murmurs. Lungs: Crackles in the bases on 7 liters oxygen.  Abdomen: soft, nontender, nondistended. No hepatosplenomegaly. No bruits or masses. Good bowel sounds. Extremities: no cyanosis, clubbing, rash, edema Neuro: alert & orientedx3, cranial nerves grossly intact. moves all 4 extremities w/o difficulty. Affect pleasant  Assessment/Plan: 1. Pulmonary fibrosis: Noted on HRCT.  He is on home oxygen. Had suspected post-ARDS pulmonary fibrosis. Evaluated at [facility], where repeat CT showed evidence of more active inflammation with ?NSIP versus hypersensitivity pneumonitis.   Followed at [facility].  - Plans to remain on Prednisone  5 mg daily.  2. Atrial fibrillation and flutter: Not an ablation candidate given size,  pulmonary disease.  Patient was cardioverted in 3/18 to NSR but was back in atrial fibrillation by 5/18.  He is now on dofetilide and ranolazine.  He had repeat DCCV in 5/19.   - Continue dofetilide, QTc ok.  - He is on ranolazine in addition to dofetilide, per Dr. Graciela Husbands this will be ok to continue as long as QT interval is not excessively prolonged => QTc -ok.   Check BMET and Mag today. Refilled his potassium today.  - Continue Xarelto.  - Continue Toprol XL.  3. Chronic primarily diastolic CHF: Diastolic CHF with pulmonary venous hypertension on 12/17 echo.  Most recent echo in 3/19 at [facility] with EF 45-50%, mildly decreased RV systolic function.   NYHA III chronicallly.  For now we will continue lasix 80 mg twice a day. Refill potassium today. .   4. CKD: Stage III.  Check BMET today.  5. Obesity:  Body mass index is 45.46 kg/m. Discussed portion control.  6. PFO: PFO noted on last echo with positive bubble study. Per report, it did not appear large.    Foillow up 3-4 months with Dr Shirlee Latch. Greater than 50% of the (total minutes 25) visit spent in counseling/coordination of care regarding the above.        NP-C  06/15/2018

## 2018-06-15 NOTE — Patient Instructions (Addendum)
Labs were done today. We will call you with any ABNORMAL results. No news is good news!  STOP Lasix 40mg    START Lasix 80mg  (1 tab) twice a day. A new prescription has been sent to your pharmacy.  A refill for potassium has been sent to your pharmacy  Your physician recommends that you schedule a follow-up appointment in: 3-4 months with Dr. Shirlee Latch

## 2018-06-21 ENCOUNTER — Encounter (HOSPITAL_COMMUNITY): Payer: Self-pay | Admitting: Cardiology

## 2018-07-27 ENCOUNTER — Other Ambulatory Visit (HOSPITAL_COMMUNITY): Payer: Self-pay | Admitting: Nurse Practitioner

## 2018-08-29 DEATH — deceased

## 2018-09-18 ENCOUNTER — Ambulatory Visit (HOSPITAL_COMMUNITY): Admission: RE | Admit: 2018-09-18 | Payer: Medicaid Other | Source: Ambulatory Visit | Admitting: Cardiology

## 2018-09-18 ENCOUNTER — Other Ambulatory Visit: Payer: Self-pay
